# Patient Record
Sex: Male | Born: 1960 | Race: Black or African American | Hispanic: No | Marital: Single | Smoking: Former smoker
Health system: Southern US, Community
[De-identification: ages and names within clinical notes are randomized; demographics above are authoritative.]

## PROBLEM LIST (undated history)

## (undated) DIAGNOSIS — G4700 Insomnia, unspecified: Secondary | ICD-10-CM

## (undated) DIAGNOSIS — E785 Hyperlipidemia, unspecified: Secondary | ICD-10-CM

## (undated) DIAGNOSIS — I10 Essential (primary) hypertension: Secondary | ICD-10-CM

## (undated) DIAGNOSIS — U071 COVID-19: Secondary | ICD-10-CM

## (undated) DIAGNOSIS — M1990 Unspecified osteoarthritis, unspecified site: Secondary | ICD-10-CM

## (undated) DIAGNOSIS — M109 Gout, unspecified: Secondary | ICD-10-CM

## (undated) DIAGNOSIS — E119 Type 2 diabetes mellitus without complications: Secondary | ICD-10-CM

## (undated) DIAGNOSIS — K219 Gastro-esophageal reflux disease without esophagitis: Secondary | ICD-10-CM

## (undated) HISTORY — DX: Unspecified osteoarthritis, unspecified site: M19.90

## (undated) HISTORY — DX: Hyperlipidemia, unspecified: E78.5

## (undated) HISTORY — PX: LEG SURGERY: SHX1003

## (undated) HISTORY — DX: Insomnia, unspecified: G47.00

## (undated) HISTORY — PX: JOINT REPLACEMENT: SHX530

## (undated) NOTE — *Deleted (*Deleted)
POST OPERATIVE OFFICE NOTE    CC:  F/u for surgery  HPI:  This is a 42 y.o. male who is s/p left iliofemoral embolectomy on 01/19/2020 by Dr. Darrick Penna.    He initially presented with 36-48 hrs of coolness and pain in the left BKA stump.  He had his amputation in 2019 by Dr. Lajoyce Corners and had chronic pain since then but worse recently.  Pt uses prosthetic for transfers.   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   He was felt that his acute ischemia in the left BKA was most likely secondary to hypercoagulable state from covid.  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 Myra Gianotti.  His wound cultures showed GPC with Serratia marcescens and Enterococcus faecalis and he was started on Keflex & then switched to oral amoxicillin and Bactrim DS.   He was discharged from the hospital on 10/31 & at that time, his AKA was healing nicely.  He had some slight separation in the left groin that would need meticulous wound care.  He does not have any prosthetic material in the left groin as he had an arteriotomy with primary closure.  He had been started on Eliquis.  He was discharged to SNF.  He was seen back on 02/05/2020 and his left groin wound was not healing and he was scheduled for groin debridement with vac placement on 02/09/2020.  His abx were extended.  Pt was seen back in the office by Dr. Darrick Penna and at that time, his left AKA was healing but still have some edema but no drainage or erythema.  The left groin had healthy appearing granulation tissue and he was scheduled for a 2 week follow up in 2 weeks and to continue vac therapy.  Plan for staple removal at that time.    Pt returns today for follow up.  ***  No Known Allergies  Current Outpatient Medications  Medication Sig Dispense Refill  . acetaminophen (TYLENOL) 325 MG tablet Take 2 tablets (650 mg total) by mouth every 6 (six) hours. 240 tablet 0  . amitriptyline  (ELAVIL) 25 MG tablet Take 25 mg by mouth at bedtime.    Marland Kitchen amLODipine (NORVASC) 10 MG tablet Take 1 tablet (10 mg total) by mouth daily. 30 tablet 0  . apixaban (ELIQUIS) 5 MG TABS tablet Take 1 tablet (5 mg total) by mouth 2 (two) times daily. 60 tablet 0  . aspirin EC 81 MG EC tablet Take 1 tablet (81 mg total) by mouth daily at 6 (six) AM. Swallow whole. 30 tablet 0  . Cholecalciferol (VITAMIN D) 125 MCG (5000 UT) CAPS TAKE (1) CAPSULE BY MOUTH ONCE DAILY. (Patient taking differently: Take 5,000 Units by mouth daily. ) 30 capsule 2  . glipiZIDE (GLUCOTROL XL) 10 MG 24 hr tablet Take 1 tablet (10 mg total) by mouth daily. 90 tablet 1  . glucose blood (ACCU-CHEK AVIVA PLUS) test strip CHECK BLOOD SUGAR 4 TIMES DAILY. 150 strip 2  . insulin aspart (NOVOLOG FLEXPEN) 100 UNIT/ML FlexPen Inject 24-30 Units into the skin 3 (three) times daily with meals. 30 mL 2  . insulin glargine (LANTUS) 100 UNIT/ML injection Inject 0.5 mLs (50 Units total) into the skin 2 (two) times daily. 30 mL 0  . Insulin Pen Needle (ULTICARE MINI PEN NEEDLES) 31G X 6 MM MISC USE AS DIRECTED 100 each 2  . metFORMIN (GLUCOPHAGE-XR) 500 MG 24 hr  tablet TAKE 1 TABLET BY MOUTH TWICE DAILY AFTER A MEAL (Patient taking differently: Take 500 mg by mouth 2 (two) times daily. After meals) 60 tablet 11  . metoprolol succinate (TOPROL-XL) 25 MG 24 hr tablet Take 1 tablet (25 mg total) by mouth daily. 30 tablet 0  . oxyCODONE-acetaminophen (PERCOCET/ROXICET) 5-325 MG tablet Take 1-2 tablets by mouth every 4 (four) hours as needed for moderate pain. 12 tablet 0  . pantoprazole (PROTONIX) 40 MG tablet Take 1 tablet (40 mg total) by mouth daily. 30 tablet 0  . polyethylene glycol (MIRALAX / GLYCOLAX) packet Take 17 g by mouth daily. (Patient taking differently: Take 17 g by mouth daily as needed for moderate constipation. ) 30 each 0  . rosuvastatin (CRESTOR) 10 MG tablet Take 2 tablets (20 mg total) by mouth daily. 90 tablet 3   No current  facility-administered medications for this visit.     ROS:  See HPI  Physical Exam:  ***  Incision:  *** Extremities:  *** Neuro: *** Abdomen:  ***  Assessment/Plan:  This is a 51 y.o. male who is s/p: eft iliofemoral embolectomy on 01/19/2020 by Dr. Darrick Penna, conversion of left BKA to AKA on 01/29/2020 by Dr. Myra Gianotti and left excisional groin debridement on 02/09/2020 by Dr. Darrick Penna presents today for wound check and staple removal.  -***  Doreatha Massed, [facility] [phone]  Clinic MD:  Darrick Penna

---

## 2005-06-19 ENCOUNTER — Emergency Department: Payer: Self-pay | Admitting: Emergency Medicine

## 2008-10-13 ENCOUNTER — Emergency Department: Payer: Self-pay | Admitting: Emergency Medicine

## 2008-10-14 ENCOUNTER — Emergency Department: Payer: Self-pay | Admitting: Unknown Physician Specialty

## 2008-12-01 ENCOUNTER — Emergency Department: Payer: Self-pay | Admitting: Emergency Medicine

## 2009-10-18 ENCOUNTER — Emergency Department: Payer: Self-pay | Admitting: Emergency Medicine

## 2010-04-25 ENCOUNTER — Emergency Department: Payer: Self-pay | Admitting: Emergency Medicine

## 2010-04-30 ENCOUNTER — Inpatient Hospital Stay: Payer: Self-pay | Admitting: Orthopedic Surgery

## 2010-05-25 ENCOUNTER — Inpatient Hospital Stay: Payer: Self-pay | Admitting: Internal Medicine

## 2010-06-10 ENCOUNTER — Ambulatory Visit: Payer: Self-pay | Admitting: Family Medicine

## 2010-07-03 ENCOUNTER — Ambulatory Visit: Payer: Self-pay | Admitting: Family Medicine

## 2010-12-23 ENCOUNTER — Inpatient Hospital Stay: Payer: Self-pay | Admitting: Internal Medicine

## 2011-08-05 ENCOUNTER — Emergency Department: Payer: Self-pay | Admitting: Unknown Physician Specialty

## 2011-08-05 LAB — BASIC METABOLIC PANEL
Anion Gap: 6 — ABNORMAL LOW (ref 7–16)
BUN: 5 mg/dL — ABNORMAL LOW (ref 7–18)
Chloride: 103 mmol/L (ref 98–107)
Co2: 27 mmol/L (ref 21–32)
Creatinine: 0.7 mg/dL (ref 0.60–1.30)
EGFR (Non-African Amer.): >60
Glucose: 327 mg/dL — ABNORMAL HIGH (ref 65–99)
Potassium: 3.7 mmol/L (ref 3.5–5.1)
Sodium: 136 mmol/L (ref 136–145)

## 2011-08-05 LAB — CBC WITH DIFFERENTIAL/PLATELET
Basophil #: 0 10*3/uL (ref 0.0–0.1)
Basophil %: 0.5 %
Eosinophil %: 0.4 %
HGB: 10.5 g/dL — ABNORMAL LOW (ref 13.0–18.0)
Lymphocyte #: 2.1 10*3/uL (ref 1.0–3.6)
Lymphocyte %: 29 %
MCHC: 33.1 g/dL (ref 32.0–36.0)
MCV: 84 fL (ref 80–100)
Neutrophil %: 57.7 %
Platelet: 283 10*3/uL (ref 150–440)
RBC: 3.76 10*6/uL — ABNORMAL LOW (ref 4.40–5.90)
RDW: 16.1 % — ABNORMAL HIGH (ref 11.5–14.5)

## 2011-08-05 LAB — URIC ACID: Uric Acid: 3.1 mg/dL — ABNORMAL LOW (ref 3.5–7.2)

## 2012-07-11 ENCOUNTER — Emergency Department: Payer: Self-pay | Admitting: Emergency Medicine

## 2012-07-11 LAB — COMPREHENSIVE METABOLIC PANEL
Albumin: 3.3 g/dL — ABNORMAL LOW (ref 3.4–5.0)
Alkaline Phosphatase: 136 U/L (ref 50–136)
Anion Gap: 11 (ref 7–16)
BUN: 9 mg/dL (ref 7–18)
Calcium, Total: 9 mg/dL (ref 8.5–10.1)
Co2: 24 mmol/L (ref 21–32)
Creatinine: 0.51 mg/dL — ABNORMAL LOW (ref 0.60–1.30)
EGFR (African American): >60
Glucose: 480 mg/dL — ABNORMAL HIGH (ref 65–99)
Osmolality: 275 (ref 275–301)
SGOT(AST): 26 U/L (ref 15–37)
SGPT (ALT): 30 U/L (ref 12–78)
Sodium: 127 mmol/L — ABNORMAL LOW (ref 136–145)
Total Protein: 8.2 g/dL (ref 6.4–8.2)

## 2012-07-11 LAB — CBC
HGB: 11.1 g/dL — ABNORMAL LOW (ref 13.0–18.0)
MCH: 30.7 pg (ref 26.0–34.0)
MCHC: 35.2 g/dL (ref 32.0–36.0)
Platelet: 301 10*3/uL (ref 150–440)
RBC: 3.62 10*6/uL — ABNORMAL LOW (ref 4.40–5.90)
RDW: 14.1 % (ref 11.5–14.5)

## 2012-08-22 ENCOUNTER — Emergency Department: Payer: Self-pay | Admitting: Emergency Medicine

## 2012-08-22 LAB — DRUG SCREEN, URINE
Amphetamines, Ur Screen: NEGATIVE (ref ?–1000)
Cannabinoid 50 Ng, Ur ~~LOC~~: NEGATIVE (ref ?–50)
Cocaine Metabolite,Ur ~~LOC~~: NEGATIVE (ref ?–300)
MDMA (Ecstasy)Ur Screen: NEGATIVE (ref ?–500)
Methadone, Ur Screen: NEGATIVE (ref ?–300)
Opiate, Ur Screen: NEGATIVE (ref ?–300)
Phencyclidine (PCP) Ur S: NEGATIVE (ref ?–25)

## 2012-08-22 LAB — COMPREHENSIVE METABOLIC PANEL
Albumin: 3.9 g/dL (ref 3.4–5.0)
Alkaline Phosphatase: 99 U/L (ref 50–136)
BUN: 6 mg/dL — ABNORMAL LOW (ref 7–18)
Calcium, Total: 9.1 mg/dL (ref 8.5–10.1)
Chloride: 101 mmol/L (ref 98–107)
Co2: 23 mmol/L (ref 21–32)
Creatinine: 0.61 mg/dL (ref 0.60–1.30)
EGFR (African American): >60
EGFR (Non-African Amer.): >60
Glucose: 379 mg/dL — ABNORMAL HIGH (ref 65–99)
SGPT (ALT): 39 U/L (ref 12–78)
Sodium: 134 mmol/L — ABNORMAL LOW (ref 136–145)
Total Protein: 8.4 g/dL — ABNORMAL HIGH (ref 6.4–8.2)

## 2012-08-22 LAB — CBC
HCT: 34 % — ABNORMAL LOW (ref 40.0–52.0)
HGB: 11.5 g/dL — ABNORMAL LOW (ref 13.0–18.0)
MCH: 30.3 pg (ref 26.0–34.0)
RBC: 3.79 10*6/uL — ABNORMAL LOW (ref 4.40–5.90)
RDW: 16.7 % — ABNORMAL HIGH (ref 11.5–14.5)
WBC: 8.5 10*3/uL (ref 3.8–10.6)

## 2012-08-22 LAB — URINALYSIS, COMPLETE
Glucose,UR: >=500 mg/dL (ref 0–75)
Ketone: NEGATIVE
Leukocyte Esterase: NEGATIVE
Ph: 6 (ref 4.5–8.0)
Protein: NEGATIVE
RBC,UR: 1 /HPF (ref 0–5)
Specific Gravity: 1.003 (ref 1.003–1.030)

## 2012-08-22 LAB — ETHANOL
Ethanol %: 0.298 % — ABNORMAL HIGH (ref 0.000–0.080)
Ethanol: 298 mg/dL

## 2015-01-21 ENCOUNTER — Encounter: Payer: Self-pay | Admitting: Emergency Medicine

## 2015-01-21 ENCOUNTER — Emergency Department
Admission: EM | Admit: 2015-01-21 | Discharge: 2015-01-21 | Disposition: A | Discharge status: Home / Self Care | Payer: Medicaid Other | Attending: Emergency Medicine | Admitting: Emergency Medicine

## 2015-01-21 ENCOUNTER — Emergency Department: Payer: Medicaid Other

## 2015-01-21 DIAGNOSIS — I10 Essential (primary) hypertension: Secondary | ICD-10-CM | POA: Insufficient documentation

## 2015-01-21 DIAGNOSIS — G8929 Other chronic pain: Secondary | ICD-10-CM

## 2015-01-21 DIAGNOSIS — G8911 Acute pain due to trauma: Secondary | ICD-10-CM | POA: Insufficient documentation

## 2015-01-21 DIAGNOSIS — Z794 Long term (current) use of insulin: Secondary | ICD-10-CM | POA: Insufficient documentation

## 2015-01-21 DIAGNOSIS — Z79899 Other long term (current) drug therapy: Secondary | ICD-10-CM | POA: Insufficient documentation

## 2015-01-21 DIAGNOSIS — M19172 Post-traumatic osteoarthritis, left ankle and foot: Secondary | ICD-10-CM | POA: Insufficient documentation

## 2015-01-21 DIAGNOSIS — M25572 Pain in left ankle and joints of left foot: Secondary | ICD-10-CM

## 2015-01-21 DIAGNOSIS — Z720 Tobacco use: Secondary | ICD-10-CM | POA: Insufficient documentation

## 2015-01-21 DIAGNOSIS — E119 Type 2 diabetes mellitus without complications: Secondary | ICD-10-CM | POA: Insufficient documentation

## 2015-01-21 HISTORY — DX: Essential (primary) hypertension: I10

## 2015-01-21 HISTORY — DX: Type 2 diabetes mellitus without complications: E11.9

## 2015-01-21 MED ORDER — NAPROXEN 500 MG PO TBEC: 500.0000 mg | DELAYED_RELEASE_TABLET | Freq: Two times a day (BID) | ORAL | Status: DC

## 2015-01-21 MED ORDER — HYDROCODONE-ACETAMINOPHEN 5-325 MG PO TABS
1.0000 | ORAL_TABLET | Freq: Four times a day (QID) | ORAL | Status: DC | PRN
Start: 2015-01-21 — End: 2016-04-04

## 2015-01-21 MED ORDER — HYDROCODONE-ACETAMINOPHEN 5-325 MG PO TABS
1.0000 | ORAL_TABLET | Freq: Once | ORAL | Status: AC
  Administered 2015-01-21: 1 via ORAL
  Filled 2015-01-21: qty 1

## 2015-01-21 NOTE — ED Notes (Signed)
Pt states he has had previous injury to left ankle and is complaining of continued pain and swelling to ankle.

## 2015-01-21 NOTE — ED Provider Notes (Signed)
[facility] Emergency Department Provider Note ____________________________________________  Time seen: 1225  I have reviewed the triage vital signs and the nursing notes.  HISTORY  Chief Complaint  Ankle Pain  HPI [name] is a 54 y.o. male reports to the ED for evaluation and management of his left ankle pain that he does described as chronic and ongoing. He gives a history of about 10 years of left ankle pain, that has been intermittently managed by his primary provider. He also gives a remote history of fractures to his ankle one in 2000, and the other in 2010. He is unaware of any hardware or screws in the ankle joint. He notes that he has limited range of motion to the ankle at baseline, but over the last few months has noted increased stiffness to the ankle. He does also admit that his primary care provider, has provided him with pain medicines but he reports poor pain relief with the medications. He denies any recent injury, trauma, or swelling to the joint.He rates his pain at a 10/10 in triage.  Past Medical History  Diagnosis Date  . Diabetes mellitus without complication ([place])   . Hypertension     There are no active problems to display for this patient.   Past Surgical History  Procedure Laterality Date  . Joint replacement      Current Outpatient Rx  Name  Route  Sig  Dispense  Refill  . amLODipine (NORVASC) 10 MG tablet   Oral   Take 10 mg by mouth daily.         . furosemide (LASIX) 40 MG tablet   Oral   Take 40 mg by mouth daily.         . insulin glargine (LANTUS) 100 UNIT/ML injection   Subcutaneous   Inject 55 Units into the skin daily.         Marland Kitchen lisinopril (PRINIVIL,ZESTRIL) 40 MG tablet   Oral   Take 40 mg by mouth daily.         . metFORMIN (GLUCOPHAGE) 500 MG tablet   Oral   Take 1,000 mg by mouth daily with breakfast.         . metoprolol succinate (TOPROL-XL) 25 MG 24 hr tablet   Oral   Take 25  mg by mouth daily.         Marland Kitchen HYDROcodone-acetaminophen (NORCO) 5-325 MG tablet   Oral   Take 1 tablet by mouth every 6 (six) hours as needed for moderate pain.   15 tablet   0   . naproxen (EC NAPROSYN) 500 MG EC tablet   Oral   Take 1 tablet (500 mg total) by mouth 2 (two) times daily with a meal.   30 tablet   0     Allergies Review of patient's allergies indicates no known allergies.  History reviewed. No pertinent family history.  Social History Social History  Substance Use Topics  . Smoking status: Current Every Day Smoker -- 0.50 packs/day    Types: Cigarettes  . Smokeless tobacco: None  . Alcohol Use: 2.4 oz/week    4 Cans of beer per week    Review of Systems  Constitutional: Negative for fever. Eyes: Negative for visual changes. ENT: Negative for sore throat. Cardiovascular: Negative for chest pain. Respiratory: Negative for shortness of breath. Gastrointestinal: Negative for abdominal pain, vomiting and diarrhea. Genitourinary: Negative for dysuria. Musculoskeletal: Negative for back pain. Left ankle pain as above. Skin: Negative for rash. Neurological:  Negative for headaches, focal weakness or numbness. ____________________________________________  PHYSICAL EXAM:  VITAL SIGNS: ED Triage Vitals  Enc Vitals Group     BP 01/21/15 1133 122/70 mmHg     Pulse Rate 01/21/15 1133 66     Resp 01/21/15 1133 16     Temp 01/21/15 1133 98.5 F (36.9 C)     Temp Source 01/21/15 1133 Oral     SpO2 01/21/15 1133 98 %     Weight 01/21/15 1133 226 lb (102.513 kg)     Height 01/21/15 1133 5\' 5"  (1.651 m)     Head Cir --      Peak Flow --      Pain Score 01/21/15 1134 10     Pain Loc --      Pain Edu? --      Excl. in [place]? --     Constitutional: Alert and oriented. Well appearing and in no distress. Head: Normocephalic and atraumatic.      Eyes: Conjunctivae are normal. PERRL. Normal extraocular movements      Ears: Canals clear. TMs intact  bilaterally.   Nose: No congestion/rhinorrhea.   Mouth/Throat: Mucous membranes are moist.   Neck: Supple. No thyromegaly. Hematological/Lymphatic/Immunological: No cervical lymphadenopathy. Cardiovascular: Normal rate, regular rhythm. Normal distal pulses. Respiratory: Normal respiratory effort. No wheezes/rales/rhonchi. Gastrointestinal: Soft and nontender. No distention. Musculoskeletal: Left foot with chronic degenerative changes noted. Limited ankle ROM.   Nontender with normal range of motion in all extremities.  Neurologic:  Normal gait without ataxia. Normal speech and language. No gross focal neurologic deficits are appreciated. Skin:  Skin is warm, dry and intact. No rash noted. Psychiatric: Mood and affect are normal. Patient exhibits appropriate insight and judgment. ___________________________________________   RADIOLOGY Left Ankle  IMPRESSION: There is old fracture deformity of distal tibia and fibula. Degenerative changes tibiotalar joint and fibulotalar joint. Mild dorsal spurring tarsal region. Plantar spur of calcaneus. No acute fracture or subluxation. Postsurgical changes distal tibia.  I, Scarlettrose Costilow, Dannielle Karvonen, personally viewed and evaluated these images (plain radiographs) as part of my medical decision making.  ____________________________________________  PROCEDURES  Norco 5-325 mg PO ____________________________________________  INITIAL IMPRESSION / ASSESSMENT AND PLAN / ED COURSE  Patient with chronic left ankle pain with underlying degenerative joint changes. No acute radiographic evidence of injury. Patient will be discharged with a prescription for Naprosyn as well as Vicodin to dose as needed for pain. He is encouraged to follow-up with his primary care provider for ongoing pain management of the ankle. A copy of the radiology report is provided to the patient at his request. ____________________________________________  FINAL CLINICAL  IMPRESSION(S) / ED DIAGNOSES  Final diagnoses:  Chronic ankle pain, left  Post-traumatic osteoarthritis of left ankle      Melvenia Needles, PA-C 01/21/15 1518  Carrie Mew, MD 01/21/15 1539

## 2015-01-21 NOTE — Discharge Instructions (Signed)
Chronic Pain  Chronic pain can be defined as pain that is off and on and lasts for 3-6 months or longer. Many things cause chronic pain, which can make it difficult to make a diagnosis. There are many treatment options available for chronic pain. However, finding a treatment that works well for you may require trying various approaches until the right one is found. Many people benefit from a combination of two or more types of treatment to control their pain.  SYMPTOMS   Chronic pain can occur anywhere in the body and can range from mild to very severe. Some types of chronic pain include:  · Headache.  · Low back pain.  · Cancer pain.  · Arthritis pain.  · Neurogenic pain. This is pain resulting from damage to nerves.   People with chronic pain may also have other symptoms such as:  · Depression.  · Anger.  · Insomnia.  · Anxiety.  DIAGNOSIS   Your health care provider will help diagnose your condition over time. In many cases, the initial focus will be on excluding possible conditions that could be causing the pain. Depending on your symptoms, your health care provider may order tests to diagnose your condition. Some of these tests may include:   · Blood tests.    · CT scan.    · MRI.    · X-rays.    · Ultrasounds.    · Nerve conduction studies.    You may need to see a specialist.   TREATMENT   Finding treatment that works well may take time. You may be referred to a pain specialist. He or she may prescribe medicine or therapies, such as:   · Mindful meditation or yoga.  · Shots (injections) of numbing or pain-relieving medicines into the spine or area of pain.  · Local electrical stimulation.  · Acupuncture.    · Massage therapy.    · Aroma, color, light, or sound therapy.    · Biofeedback.    · Working with a physical therapist to keep from getting stiff.    · Regular, gentle exercise.    · Cognitive or behavioral therapy.    · Group support.    Sometimes, surgery may be recommended.   HOME CARE INSTRUCTIONS    · Take all medicines as directed by your health care provider.    · Lessen stress in your life by relaxing and doing things such as listening to calming music.    · Exercise or be active as directed by your health care provider.    · Eat a healthy diet and include things such as vegetables, fruits, fish, and lean meats in your diet.    · Keep all follow-up appointments with your health care provider.    · Attend a support group with others suffering from chronic pain.  SEEK MEDICAL CARE IF:   · Your pain gets worse.    · You develop a new pain that was not there before.    · You cannot tolerate medicines given to you by your health care provider.    · You have new symptoms since your last visit with your health care provider.    SEEK IMMEDIATE MEDICAL CARE IF:   · You feel weak.    · You have decreased sensation or numbness.    · You lose control of bowel or bladder function.    · Your pain suddenly gets much worse.    · You develop shaking.  · You develop chills.  · You develop confusion.  · You develop chest pain.  · You develop shortness of breath.    MAKE SURE YOU:  ·   Document Revised: 11/20/2012 Document Reviewed: 09/13/2012 Elsevier Interactive Patient Education 2016 Elsevier Inc.  Ankle Pain Ankle pain is a common symptom. The bones, cartilage, tendons, and muscles of the ankle joint perform a lot of work each day. The ankle joint holds your body weight and allows you to move around. Ankle pain can occur on either side or back of 1 or both ankles. Ankle pain may be sharp and burning or dull and aching. There may be tenderness, stiffness, redness, or warmth around the  ankle. The pain occurs more often when a person walks or puts pressure on the ankle. CAUSES  There are many reasons ankle pain can develop. It is important to work with your caregiver to identify the cause since many conditions can impact the bones, cartilage, muscles, and tendons. Causes for ankle pain include:  Injury, including a break (fracture), sprain, or strain often due to a fall, sports, or a high-impact activity.  Swelling (inflammation) of a tendon (tendonitis).  Achilles tendon rupture.  Ankle instability after repeated sprains and strains.  Poor foot alignment.  Pressure on a nerve (tarsal tunnel syndrome).  Arthritis in the ankle or the lining of the ankle.  Crystal formation in the ankle (gout or pseudogout). DIAGNOSIS  A diagnosis is based on your medical history, your symptoms, results of your physical exam, and results of diagnostic tests. Diagnostic tests may include X-ray exams or a computerized magnetic scan (magnetic resonance imaging, MRI). TREATMENT  Treatment will depend on the cause of your ankle pain and may include:  Keeping pressure off the ankle and limiting activities.  Using crutches or other walking support (a cane or brace).  Using rest, ice, compression, and elevation.  Participating in physical therapy or home exercises.  Wearing shoe inserts or special shoes.  Losing weight.  Taking medications to reduce pain or swelling or receiving an injection.  Undergoing surgery. HOME CARE INSTRUCTIONS   Only take over-the-counter or prescription medicines for pain, discomfort, or fever as directed by your caregiver.  Put ice on the injured area.  Put ice in a plastic bag.  Place a towel between your skin and the bag.  Leave the ice on for 15-20 minutes at a time, 03-04 times a day.  Keep your leg raised (elevated) when possible to lessen swelling.  Avoid activities that cause ankle pain.  Follow specific exercises as directed by your  caregiver.  Record how often you have ankle pain, the location of the pain, and what it feels like. This information may be helpful to you and your caregiver.  Ask your caregiver about returning to work or sports and whether you should drive.  Follow up with your caregiver for further examination, therapy, or testing as directed. SEEK MEDICAL CARE IF:   Pain or swelling continues or worsens beyond 1 week.  You have an oral temperature above 102 F (38.9 C).  You are feeling unwell or have chills.  You are having an increasingly difficult time with walking.  You have loss of sensation or other new symptoms.  You have questions or concerns. MAKE SURE YOU:   Understand these instructions.  Will watch your condition.  Will get help right away if you are not doing well or get worse.   This information is not intended to replace advice given to you by your health care provider. Make sure you discuss any questions you have with your health care provider.   Document Released: 09/07/2009 Document Revised: 06/12/2011 Document Reviewed: 10/20/2014 Elsevier  Interactive Patient Education 2016 Elsevier Inc.  Osteoarthritis Osteoarthritis is a disease that causes soreness and inflammation of a joint. It occurs when the cartilage at the affected joint wears down. Cartilage acts as a cushion, covering the ends of bones where they meet to form a joint. Osteoarthritis is the most common form of arthritis. It often occurs in older people. The joints affected most often by this condition include those in the:  Ends of the fingers.  Thumbs.  Neck.  Lower back.  Knees.  Hips. CAUSES  Over time, the cartilage that covers the ends of bones begins to wear away. This causes bone to rub on bone, producing pain and stiffness in the affected joints.  RISK FACTORS Certain factors can increase your chances of having osteoarthritis, including:  Older age.  Excessive body weight.  Overuse of  joints.  Previous joint injury. SIGNS AND SYMPTOMS   Pain, swelling, and stiffness in the joint.  Over time, the joint may lose its normal shape.  Small deposits of bone (osteophytes) may grow on the edges of the joint.  Bits of bone or cartilage can break off and float inside the joint space. This may cause more pain and damage. DIAGNOSIS  Your health care provider will do a physical exam and ask about your symptoms. Various tests may be ordered, such as:  X-rays of the affected joint.  Blood tests to rule out other types of arthritis. Additional tests may be used to diagnose your condition. TREATMENT  Goals of treatment are to control pain and improve joint function. Treatment plans may include:  A prescribed exercise program that allows for rest and joint relief.  A weight control plan.  Pain relief techniques, such as:  Properly applied heat and cold.  Electric pulses delivered to nerve endings under the skin (transcutaneous electrical nerve stimulation [TENS]).  Massage.  Certain nutritional supplements.  Medicines to control pain, such as:  Acetaminophen.  Nonsteroidal anti-inflammatory drugs (NSAIDs), such as naproxen.  Narcotic or central-acting agents, such as tramadol.  Corticosteroids. These can be given orally or as an injection.  Surgery to reposition the bones and relieve pain (osteotomy) or to remove loose pieces of bone and cartilage. Joint replacement may be needed in advanced states of osteoarthritis. HOME CARE INSTRUCTIONS   Take medicines only as directed by your health care provider.  Maintain a healthy weight. Follow your health care provider's instructions for weight control. This may include dietary instructions.  Exercise as directed. Your health care provider can recommend specific types of exercise. These may include:  Strengthening exercises. These are done to strengthen the muscles that support joints affected by arthritis. They can  be performed with weights or with exercise bands to add resistance.  Aerobic activities. These are exercises, such as brisk walking or low-impact aerobics, that get your heart pumping.  Range-of-motion activities. These keep your joints limber.  Balance and agility exercises. These help you maintain daily living skills.  Rest your affected joints as directed by your health care provider.  Keep all follow-up visits as directed by your health care provider. SEEK MEDICAL CARE IF:   Your skin turns red.  You develop a rash in addition to your joint pain.  You have worsening joint pain.  You have a fever along with joint or muscle aches. SEEK IMMEDIATE MEDICAL CARE IF:  You have a significant loss of weight or appetite.  You have night sweats. [place] of Arthritis and Musculoskeletal  and Skin Diseases: www.niams.SouthExposed.es  [employer] on Aging: http://kim-miller.com/  American College of Rheumatology: www.rheumatology.org   This information is not intended to replace advice given to you by your health care provider. Make sure you discuss any questions you have with your health care provider.   Document Released: 03/20/2005 Document Revised: 04/10/2014 Document Reviewed: 11/25/2012 Elsevier Interactive Patient Education [employer].   Your exam and x-ray shows chronic arthritic changes to your ankle joint. You should take the prescription meds as directed.  Follow-up with Dr. Clide Deutscher for further care.

## 2015-05-10 ENCOUNTER — Emergency Department: Payer: Medicaid Other

## 2015-05-10 ENCOUNTER — Encounter: Payer: Self-pay | Admitting: *Deleted

## 2015-05-10 DIAGNOSIS — M10062 Idiopathic gout, left knee: Secondary | ICD-10-CM | POA: Diagnosis not present

## 2015-05-10 DIAGNOSIS — M25562 Pain in left knee: Secondary | ICD-10-CM | POA: Diagnosis present

## 2015-05-10 DIAGNOSIS — F17210 Nicotine dependence, cigarettes, uncomplicated: Secondary | ICD-10-CM | POA: Insufficient documentation

## 2015-05-10 DIAGNOSIS — Z791 Long term (current) use of non-steroidal anti-inflammatories (NSAID): Secondary | ICD-10-CM | POA: Insufficient documentation

## 2015-05-10 DIAGNOSIS — Z79899 Other long term (current) drug therapy: Secondary | ICD-10-CM | POA: Insufficient documentation

## 2015-05-10 DIAGNOSIS — I10 Essential (primary) hypertension: Secondary | ICD-10-CM | POA: Diagnosis not present

## 2015-05-10 DIAGNOSIS — Z7984 Long term (current) use of oral hypoglycemic drugs: Secondary | ICD-10-CM | POA: Insufficient documentation

## 2015-05-10 DIAGNOSIS — Z794 Long term (current) use of insulin: Secondary | ICD-10-CM | POA: Diagnosis not present

## 2015-05-10 DIAGNOSIS — E119 Type 2 diabetes mellitus without complications: Secondary | ICD-10-CM | POA: Diagnosis not present

## 2015-05-10 NOTE — ED Notes (Signed)
Pt brought in via ems from home.  Pt has left knee pain   No known injury to knee.  Swelling to left knee

## 2015-05-11 ENCOUNTER — Emergency Department
Admission: EM | Admit: 2015-05-11 | Discharge: 2015-05-11 | Disposition: A | Discharge status: Home / Self Care | Payer: Medicaid Other | Attending: Emergency Medicine | Admitting: Emergency Medicine

## 2015-05-11 DIAGNOSIS — M109 Gout, unspecified: Secondary | ICD-10-CM

## 2015-05-11 MED ORDER — PREDNISONE 20 MG PO TABS
ORAL_TABLET | ORAL | Status: AC
  Administered 2015-05-11: 60 mg via ORAL
  Filled 2015-05-11: qty 3

## 2015-05-11 MED ORDER — COLCHICINE 0.6 MG PO TABS
1.2000 mg | ORAL_TABLET | Freq: Once | ORAL | Status: AC
  Administered 2015-05-11: 1.2 mg via ORAL
  Filled 2015-05-11: qty 2

## 2015-05-11 MED ORDER — INDOMETHACIN 50 MG PO CAPS
50.0000 mg | ORAL_CAPSULE | Freq: Once | ORAL | Status: AC
  Administered 2015-05-11: 50 mg via ORAL
  Filled 2015-05-11: qty 1

## 2015-05-11 MED ORDER — PREDNISONE 20 MG PO TABS: 60.0000 mg | ORAL_TABLET | Freq: Every day | ORAL | Status: DC

## 2015-05-11 MED ORDER — PREDNISONE 20 MG PO TABS
60.0000 mg | ORAL_TABLET | Freq: Once | ORAL | Status: AC
  Administered 2015-05-11: 60 mg via ORAL

## 2015-05-11 NOTE — Discharge Instructions (Signed)

## 2015-05-11 NOTE — ED Provider Notes (Signed)
[facility] Emergency Department Provider Note  ____________________________________________  Time seen: 6:05 AM  I have reviewed the triage vital signs and the nursing notes.   HISTORY  Chief Complaint Knee Pain     HPI [name] is a 55 y.o. male presents with 8 out of 10 left knee pain times one week with an 3 days. Patient denies any history of gout no recent trauma no fever. Pain is aggravated with ambulation.     Past Medical History  Diagnosis Date  . Diabetes mellitus without complication ([place])   . Hypertension     There are no active problems to display for this patient.   Past Surgical History  Procedure Laterality Date  . Joint replacement      Current Outpatient Rx  Name  Route  Sig  Dispense  Refill  . amLODipine (NORVASC) 10 MG tablet   Oral   Take 10 mg by mouth daily.         . furosemide (LASIX) 40 MG tablet   Oral   Take 40 mg by mouth daily.         Marland Kitchen HYDROcodone-acetaminophen (NORCO) 5-325 MG tablet   Oral   Take 1 tablet by mouth every 6 (six) hours as needed for moderate pain.   15 tablet   0   . insulin glargine (LANTUS) 100 UNIT/ML injection   Subcutaneous   Inject 55 Units into the skin daily.         Marland Kitchen lisinopril (PRINIVIL,ZESTRIL) 40 MG tablet   Oral   Take 40 mg by mouth daily.         . metFORMIN (GLUCOPHAGE) 500 MG tablet   Oral   Take 1,000 mg by mouth daily with breakfast.         . metoprolol succinate (TOPROL-XL) 25 MG 24 hr tablet   Oral   Take 25 mg by mouth daily.         . naproxen (EC NAPROSYN) 500 MG EC tablet   Oral   Take 1 tablet (500 mg total) by mouth 2 (two) times daily with a meal.   30 tablet   0     Allergies Review of patient's allergies indicates no known allergies.  No family history on file.  Social History Social History  Substance Use Topics  . Smoking status: Current Every Day Smoker -- 0.50 packs/day    Types: Cigarettes  .  Smokeless tobacco: None  . Alcohol Use: 2.4 oz/week    4 Cans of beer per week    Review of Systems  Constitutional: Negative for fever. Eyes: Negative for visual changes. ENT: Negative for sore throat. Cardiovascular: Negative for chest pain. Respiratory: Negative for shortness of breath. Gastrointestinal: Negative for abdominal pain, vomiting and diarrhea. Genitourinary: Negative for dysuria. Musculoskeletal: Negative for back pain. Positive for left knee pain Skin: Negative for rash. Neurological: Negative for headaches, focal weakness or numbness.   10-point ROS otherwise negative.  ____________________________________________   PHYSICAL EXAM:  VITAL SIGNS: ED Triage Vitals  Enc Vitals Group     BP 05/10/15 2255 192/88 mmHg     Pulse Rate 05/10/15 2255 89     Resp 05/10/15 2255 18     Temp 05/10/15 2255 97.8 F (36.6 C)     Temp Source 05/10/15 2255 Oral     SpO2 05/10/15 2255 99 %     Weight 05/10/15 2255 220 lb (99.791 kg)     Height 05/10/15 2255 5\' 7"  (  1.702 m)     Head Cir --      Peak Flow --      Pain Score 05/10/15 2300 10     Pain Loc --      Pain Edu? --      Excl. in [place]? --      Constitutional: Alert and oriented. Well appearing and in no distress. Eyes: Conjunctivae are normal. PERRL. Normal extraocular movements. ENT   Head: Normocephalic and atraumatic.   Nose: No congestion/rhinnorhea.   Mouth/Throat: Mucous membranes are moist.   Neck: No stridor. Hematological/Lymphatic/Immunilogical: No cervical lymphadenopathy. Cardiovascular: Normal rate, regular rhythm. Normal and symmetric distal pulses are present in all extremities. No murmurs, rubs, or gallops. Respiratory: Normal respiratory effort without tachypnea nor retractions. Breath sounds are clear and equal bilaterally. No wheezes/rales/rhonchi. Gastrointestinal: Soft and nontender. No distention. There is no CVA tenderness. Genitourinary: deferred Musculoskeletal: Nontender  with normal range of motion in all extremities. No joint effusions.  No lower extremity tenderness nor edema. Neurologic:  Normal speech and language. No gross focal neurologic deficits are appreciated. Speech is normal.  Skin:  Skin is warm, dry and intact. No rash noted. Psychiatric: Mood and affect are normal. Speech and behavior are normal. Patient exhibits appropriate insight and judgment.     INITIAL IMPRESSION / ASSESSMENT AND PLAN / ED COURSE  Pertinent labs & imaging results that were available during my care of the patient were reviewed by me and considered in my medical decision making (see chart for details).   ____________________________________________   FINAL CLINICAL IMPRESSION(S) / ED DIAGNOSES  Final diagnoses:  Acute gout of left knee, unspecified cause      Gregor Hams, MD 05/11/15 [phone]

## 2016-04-04 ENCOUNTER — Observation Stay
Admission: EM | Admit: 2016-04-04 | Discharge: 2016-04-05 | Disposition: A | Discharge status: Home / Self Care | Payer: Medicaid Other | Attending: Specialist | Admitting: Specialist

## 2016-04-04 ENCOUNTER — Emergency Department: Payer: Medicaid Other

## 2016-04-04 ENCOUNTER — Encounter: Payer: Self-pay | Admitting: Emergency Medicine

## 2016-04-04 DIAGNOSIS — E871 Hypo-osmolality and hyponatremia: Secondary | ICD-10-CM

## 2016-04-04 DIAGNOSIS — M2011 Hallux valgus (acquired), right foot: Secondary | ICD-10-CM | POA: Diagnosis not present

## 2016-04-04 DIAGNOSIS — D649 Anemia, unspecified: Secondary | ICD-10-CM | POA: Diagnosis not present

## 2016-04-04 DIAGNOSIS — Z794 Long term (current) use of insulin: Secondary | ICD-10-CM | POA: Diagnosis not present

## 2016-04-04 DIAGNOSIS — I10 Essential (primary) hypertension: Secondary | ICD-10-CM | POA: Diagnosis not present

## 2016-04-04 DIAGNOSIS — F17210 Nicotine dependence, cigarettes, uncomplicated: Secondary | ICD-10-CM | POA: Insufficient documentation

## 2016-04-04 DIAGNOSIS — M109 Gout, unspecified: Secondary | ICD-10-CM | POA: Diagnosis not present

## 2016-04-04 DIAGNOSIS — M19072 Primary osteoarthritis, left ankle and foot: Secondary | ICD-10-CM | POA: Diagnosis not present

## 2016-04-04 DIAGNOSIS — M19071 Primary osteoarthritis, right ankle and foot: Secondary | ICD-10-CM | POA: Insufficient documentation

## 2016-04-04 DIAGNOSIS — M79672 Pain in left foot: Secondary | ICD-10-CM

## 2016-04-04 DIAGNOSIS — E1140 Type 2 diabetes mellitus with diabetic neuropathy, unspecified: Secondary | ICD-10-CM | POA: Diagnosis not present

## 2016-04-04 DIAGNOSIS — M79671 Pain in right foot: Secondary | ICD-10-CM | POA: Diagnosis present

## 2016-04-04 HISTORY — DX: Gout, unspecified: M10.9

## 2016-04-04 LAB — COMPREHENSIVE METABOLIC PANEL
ALBUMIN: 4 g/dL (ref 3.5–5.0)
ALT: 35 U/L (ref 17–63)
ANION GAP: 8 (ref 5–15)
AST: 30 U/L (ref 15–41)
Alkaline Phosphatase: 59 U/L (ref 38–126)
BUN: 13 mg/dL (ref 6–20)
CHLORIDE: 101 mmol/L (ref 101–111)
CO2: 24 mmol/L (ref 22–32)
Calcium: 9.5 mg/dL (ref 8.9–10.3)
Creatinine, Ser: 0.84 mg/dL (ref 0.61–1.24)
GFR calc non Af Amer: >60 mL/min (ref >60)
Glucose, Bld: 178 mg/dL — ABNORMAL HIGH (ref 65–99)
POTASSIUM: 4 mmol/L (ref 3.5–5.1)
SODIUM: 133 mmol/L — AB (ref 135–145)
Total Bilirubin: 0.6 mg/dL (ref 0.3–1.2)
Total Protein: 8.3 g/dL — ABNORMAL HIGH (ref 6.5–8.1)

## 2016-04-04 LAB — CBC WITH DIFFERENTIAL/PLATELET
Basophils Absolute: 0 10*3/uL (ref 0–0.1)
Basophils Relative: 1 %
EOS ABS: 0.1 10*3/uL (ref 0–0.7)
EOS PCT: 1 %
HCT: 34.2 % — ABNORMAL LOW (ref 40.0–52.0)
Hemoglobin: 11.5 g/dL — ABNORMAL LOW (ref 13.0–18.0)
LYMPHS ABS: 2.2 10*3/uL (ref 1.0–3.6)
Lymphocytes Relative: 27 %
MCH: 30.3 pg (ref 26.0–34.0)
MCHC: 33.7 g/dL (ref 32.0–36.0)
MCV: 89.9 fL (ref 80.0–100.0)
Monocytes Absolute: 1.1 10*3/uL — ABNORMAL HIGH (ref 0.2–1.0)
Monocytes Relative: 14 %
Neutro Abs: 4.6 10*3/uL (ref 1.4–6.5)
Neutrophils Relative %: 57 %
PLATELETS: 341 10*3/uL (ref 150–440)
RBC: 3.81 MIL/uL — AB (ref 4.40–5.90)
RDW: 13.4 % (ref 11.5–14.5)
WBC: 8 10*3/uL (ref 3.8–10.6)

## 2016-04-04 LAB — URINALYSIS, COMPLETE (UACMP) WITH MICROSCOPIC
BILIRUBIN URINE: NEGATIVE
Bacteria, UA: NONE SEEN
Glucose, UA: NEGATIVE mg/dL
HGB URINE DIPSTICK: NEGATIVE
KETONES UR: NEGATIVE mg/dL
LEUKOCYTES UA: NEGATIVE
NITRITE: NEGATIVE
Protein, ur: NEGATIVE mg/dL
RBC / HPF: NONE SEEN RBC/hpf (ref 0–5)
SPECIFIC GRAVITY, URINE: 1.003 — AB (ref 1.005–1.030)
Squamous Epithelial / LPF: NONE SEEN
WBC UA: NONE SEEN WBC/hpf (ref 0–5)
pH: 5 (ref 5.0–8.0)

## 2016-04-04 LAB — URIC ACID: Uric Acid, Serum: 7.7 mg/dL — ABNORMAL HIGH (ref 4.4–7.6)

## 2016-04-04 LAB — GLUCOSE, CAPILLARY: Glucose-Capillary: 332 mg/dL — ABNORMAL HIGH (ref 65–99)

## 2016-04-04 MED ORDER — ONDANSETRON HCL 4 MG/2ML IJ SOLN: 4.0000 mg | Freq: Four times a day (QID) | INTRAMUSCULAR | Status: DC | PRN

## 2016-04-04 MED ORDER — HYDROMORPHONE HCL 1 MG/ML IJ SOLN
0.5000 mg | Freq: Once | INTRAMUSCULAR | Status: AC
  Administered 2016-04-04: 0.5 mg via INTRAVENOUS
  Filled 2016-04-04: qty 1

## 2016-04-04 MED ORDER — ACETAMINOPHEN 325 MG PO TABS: 650.0000 mg | ORAL_TABLET | Freq: Four times a day (QID) | ORAL | Status: DC | PRN

## 2016-04-04 MED ORDER — LISINOPRIL 20 MG PO TABS
40.0000 mg | ORAL_TABLET | Freq: Every day | ORAL | Status: DC
  Administered 2016-04-05: 40 mg via ORAL
  Filled 2016-04-04: qty 2

## 2016-04-04 MED ORDER — HYDROMORPHONE HCL 1 MG/ML IJ SOLN
1.0000 mg | Freq: Once | INTRAMUSCULAR | Status: AC
  Administered 2016-04-04: 1 mg via INTRAVENOUS
  Filled 2016-04-04: qty 1

## 2016-04-04 MED ORDER — FUROSEMIDE 40 MG PO TABS
40.0000 mg | ORAL_TABLET | Freq: Every day | ORAL | Status: DC
  Administered 2016-04-05: 40 mg via ORAL
  Filled 2016-04-04: qty 1

## 2016-04-04 MED ORDER — COLCHICINE 0.6 MG PO TABS
1.2000 mg | ORAL_TABLET | Freq: Once | ORAL | Status: AC
  Administered 2016-04-04: 1.2 mg via ORAL
  Filled 2016-04-04: qty 2

## 2016-04-04 MED ORDER — ONDANSETRON HCL 4 MG PO TABS: 4.0000 mg | ORAL_TABLET | Freq: Four times a day (QID) | ORAL | Status: DC | PRN

## 2016-04-04 MED ORDER — AMLODIPINE BESYLATE 10 MG PO TABS
10.0000 mg | ORAL_TABLET | Freq: Every day | ORAL | Status: DC
  Administered 2016-04-05: 10 mg via ORAL
  Filled 2016-04-04 (×2): qty 1

## 2016-04-04 MED ORDER — ENOXAPARIN SODIUM 40 MG/0.4ML ~~LOC~~ SOLN
40.0000 mg | SUBCUTANEOUS | Status: DC
  Administered 2016-04-04: 40 mg via SUBCUTANEOUS
  Filled 2016-04-04 (×2): qty 0.4

## 2016-04-04 MED ORDER — METHYLPREDNISOLONE SODIUM SUCC 125 MG IJ SOLR
125.0000 mg | Freq: Once | INTRAMUSCULAR | Status: AC
  Administered 2016-04-04: 125 mg via INTRAVENOUS
  Filled 2016-04-04: qty 2

## 2016-04-04 MED ORDER — MELOXICAM 7.5 MG PO TABS
7.5000 mg | ORAL_TABLET | Freq: Every day | ORAL | Status: DC
  Administered 2016-04-05: 7.5 mg via ORAL
  Filled 2016-04-04: qty 1

## 2016-04-04 MED ORDER — GABAPENTIN 100 MG PO CAPS
100.0000 mg | ORAL_CAPSULE | Freq: Three times a day (TID) | ORAL | Status: DC
  Administered 2016-04-04 – 2016-04-05 (×5): 100 mg via ORAL
  Filled 2016-04-04 (×5): qty 1

## 2016-04-04 MED ORDER — INSULIN GLARGINE 100 UNIT/ML ~~LOC~~ SOLN
55.0000 [IU] | Freq: Every day | SUBCUTANEOUS | Status: DC
  Administered 2016-04-05: 55 [IU] via SUBCUTANEOUS
  Filled 2016-04-04 (×2): qty 0.55

## 2016-04-04 MED ORDER — PREDNISONE 20 MG PO TABS
40.0000 mg | ORAL_TABLET | Freq: Every day | ORAL | Status: DC
  Administered 2016-04-05: 40 mg via ORAL
  Filled 2016-04-04: qty 2

## 2016-04-04 MED ORDER — METOPROLOL SUCCINATE ER 25 MG PO TB24
25.0000 mg | ORAL_TABLET | Freq: Every day | ORAL | Status: DC
  Administered 2016-04-05: 25 mg via ORAL
  Filled 2016-04-04: qty 1

## 2016-04-04 MED ORDER — METFORMIN HCL 500 MG PO TABS
1000.0000 mg | ORAL_TABLET | Freq: Every day | ORAL | Status: DC
  Administered 2016-04-05: 1000 mg via ORAL
  Filled 2016-04-04 (×2): qty 2

## 2016-04-04 MED ORDER — ACETAMINOPHEN 650 MG RE SUPP: 650.0000 mg | Freq: Four times a day (QID) | RECTAL | Status: DC | PRN

## 2016-04-04 MED ORDER — HYDROCODONE-ACETAMINOPHEN 5-325 MG PO TABS
1.0000 | ORAL_TABLET | ORAL | Status: DC | PRN
  Administered 2016-04-05: 2 via ORAL
  Filled 2016-04-04: qty 2

## 2016-04-04 NOTE — ED Notes (Signed)
Pt sleeping sitting up on stretcher. Side rails up. Pt television on.

## 2016-04-04 NOTE — ED Notes (Signed)
Collected re-draw of lavender tube and sent to lab.

## 2016-04-04 NOTE — ED Triage Notes (Signed)
Patient comes in via [facility] from home for foot pain bilaterally for a while now. Came in today because it is worse. Per EMS patient was unable to to walk to them he crawled. Pain is worse on bottom and top. Hx of diabetes, gout, htn. Pulses intact bilaterally.

## 2016-04-04 NOTE — ED Notes (Signed)
Attempted to walk patient.  Pt sat on the side of the bed and placed feet on the floor then states he is unable to bear weight on feet due to pain.  Pt laid back in bed.

## 2016-04-04 NOTE — ED Notes (Signed)
Blood pressure right arm 139/87 Blood pressure left arm 127/68  Blood pressure right leg 148/67 Blood pressure left leg 129/56

## 2016-04-04 NOTE — H&P (Signed)
[place] [facility] [place] NAME: [name]    MR#:  [id number]  DATE OF BIRTH:  May 21, 1960  DATE OF ADMISSION:  04/04/2016  PRIMARY CARE PHYSICIAN: Donnie Coffin, MD   REQUESTING/REFERRING PHYSICIAN: Dr. Lenise Arena  CHIEF COMPLAINT:   Chief Complaint  Patient presents with  . Foot Pain    HISTORY OF PRESENT ILLNESS:  [name]  is a 56 y.o. male with a known history of IBD disease, hypertension, gout who presents to the hospital due to bilateral foot pain and difficulty walking. Patient says he's been having worsening pain in his feet now for the past month but today it was much worse and therefore he called EMS and he was brought to the hospital. Patient denies any fevers, chills, trauma to the foot. He was brought to the ER and received multiple doses of IV pain medications along with colchicine given his history of gout but despite that he was unable to ambulate or bear any weight on his foot. His x-rays of his foot showed no evidence of acute pathology. Hospitalist services were contacted further treatment and evaluation.  PAST MEDICAL HISTORY:   Past Medical History:  Diagnosis Date  . Diabetes mellitus without complication ([place])   . Gout   . Hypertension     PAST SURGICAL HISTORY:   Past Surgical History:  Procedure Laterality Date  . JOINT REPLACEMENT      SOCIAL HISTORY:   Social History  Substance Use Topics  . Smoking status: Current Every Day Smoker    Packs/day: 1.00    Years: 50.00    Types: Cigarettes  . Smokeless tobacco: Never Used  . Alcohol use 2.4 oz/week    4 Cans of beer per week     Comment: 4 40 oz beer daily    FAMILY HISTORY:   Family History  Problem Relation Age of Onset  . Diabetes Mother     DRUG ALLERGIES:  No Known Allergies  REVIEW OF SYSTEMS:   Review of Systems  Constitutional: Negative for fever and weight loss.  HENT: Negative for congestion, nosebleeds  and tinnitus.   Eyes: Negative for blurred vision, double vision and redness.  Respiratory: Negative for cough, hemoptysis and shortness of breath.   Cardiovascular: Negative for chest pain, orthopnea, leg swelling and PND.  Gastrointestinal: Negative for abdominal pain, diarrhea, melena, nausea and vomiting.  Genitourinary: Negative for dysuria, hematuria and urgency.  Musculoskeletal: Negative for falls and joint pain.  Neurological: Negative for dizziness, tingling, sensory change, focal weakness, seizures, weakness and headaches.  Endo/Heme/Allergies: Negative for polydipsia. Does not bruise/bleed easily.  Psychiatric/Behavioral: Negative for depression and memory loss. The patient is not nervous/anxious.     MEDICATIONS AT HOME:   Prior to Admission medications   Medication Sig Start Date End Date Taking? Authorizing Provider  amLODipine (NORVASC) 10 MG tablet Take 10 mg by mouth daily.   Yes Historical Provider, MD  furosemide (LASIX) 40 MG tablet Take 40 mg by mouth daily.   Yes Historical Provider, MD  insulin glargine (LANTUS) 100 UNIT/ML injection Inject 55 Units into the skin daily.   Yes Historical Provider, MD  lisinopril (PRINIVIL,ZESTRIL) 40 MG tablet Take 40 mg by mouth daily.   Yes Historical Provider, MD  meloxicam (MOBIC) 7.5 MG tablet Take 7.5 mg by mouth daily.   Yes Historical Provider, MD  metFORMIN (GLUCOPHAGE) 500 MG tablet Take 1,000 mg by mouth daily with breakfast.   Yes Historical Provider,  MD  metoprolol succinate (TOPROL-XL) 25 MG 24 hr tablet Take 25 mg by mouth daily.   Yes Historical Provider, MD      VITAL SIGNS:  Blood pressure 135/79, pulse (!) 57, temperature 97.9 F (36.6 C), temperature source Oral, resp. rate 18, height 5\' 10"  (1.778 m), weight 102.5 kg (226 lb), SpO2 95 %.  PHYSICAL EXAMINATION:  Physical Exam  GENERAL:  56 y.o.-year-old patient lying in the bed in no acute distress.  EYES: Pupils equal, round, reactive to light and  accommodation. No scleral icterus. Extraocular muscles intact.  HEENT: Head atraumatic, normocephalic. Oropharynx and nasopharynx clear. No oropharyngeal erythema, moist oral mucosa  NECK:  Supple, no jugular venous distention. No thyroid enlargement, no tenderness.  LUNGS: Normal breath sounds bilaterally, no wheezing, rales, rhonchi. No use of accessory muscles of respiration.  CARDIOVASCULAR: S1, S2 RRR. No murmurs, rubs, gallops, clicks.  ABDOMEN: Soft, nontender, nondistended. Bowel sounds present. No organomegaly or mass.  EXTREMITIES: No pedal edema, cyanosis, or clubbing. + 2 pedal & radial pulses b/l. No focal deformity, warmth on feet noted.   NEUROLOGIC: Cranial nerves II through XII are intact. No focal Motor or sensory deficits appreciated b/l PSYCHIATRIC: The patient is alert and oriented x 3. Good affect.  SKIN: No obvious rash, lesion, or ulcer.   LABORATORY PANEL:   CBC  Recent Labs Lab 04/04/16 0951  WBC 8.0  HGB 11.5*  HCT 34.2*  PLT 341   ------------------------------------------------------------------------------------------------------------------  Chemistries   Recent Labs Lab 04/04/16 0843  NA 133*  K 4.0  CL 101  CO2 24  GLUCOSE 178*  BUN 13  CREATININE 0.84  CALCIUM 9.5  AST 30  ALT 35  ALKPHOS 59  BILITOT 0.6   ------------------------------------------------------------------------------------------------------------------  Cardiac Enzymes No results for input(s): TROPONINI in the last 168 hours. ------------------------------------------------------------------------------------------------------------------  RADIOLOGY:  Dg Foot Complete Left  Result Date: 04/04/2016 CLINICAL DATA:  Chronic pain EXAM: LEFT FOOT - COMPLETE 3+ VIEW COMPARISON:  Left ankle January 21, 2015 FINDINGS: Frontal, oblique, lateral views were obtained. There is evidence of prior trauma involving the calcaneus with extensive bony remodeling. There is old trauma  to the distal tibia and fibula with screw fixation in the distal tibia. There is advanced arthropathy throughout the hindfoot, particularly involving the subtalar joints which appears grossly stable. There is moderate spurring in the dorsal midfoot which appears stable. There is an old fracture with remodeling involving the fifth proximal phalanx. No acute fracture or dislocation is evident. There is mild generalized soft tissue swelling. There is no erosive change or bony destruction evident. There is mild narrowing of all PIP and DIP joints as well as the first MTP joint. There is a rather prominent inferior calcaneal spur. IMPRESSION: Old hindfoot trauma with advanced arthropathy and remodeling which appears stable. Moderate spurring in the dorsal midfoot is stable. There is a stable inferior calcaneal spur. There is generalized soft tissue swelling. There is narrowing of all PIP and DIP joints as well as the first MTP joint. No erosive change or bony destruction evident. No acute fracture or dislocation. Old trauma with remodeling fifth proximal phalanx. Electronically Signed   By: Lowella Grip III M.D.   On: 04/04/2016 08:49   Dg Foot Complete Right  Result Date: 04/04/2016 CLINICAL DATA:  Bilateral foot pain EXAM: RIGHT FOOT COMPLETE - 3+ VIEW COMPARISON:  08/05/2011 FINDINGS: Three views of the right foot submitted. Mild hallux valgus deformity. No acute fracture or subluxation. Mild degenerative changes first metatarsal phalangeal  joint. Dorsal spurring tarsal region. There is plantar and posterior spurring of calcaneus. IMPRESSION: No acute fracture or subluxation. Mild hallux valgus deformity. Degenerative changes as described above. Plantar and posterior spurring of calcaneus. Electronically Signed   By: Lahoma Crocker M.D.   On: 04/04/2016 08:48     IMPRESSION AND PLAN:   56 year old male with past medical history of diabetes, hypertension, gout who presents to the hospital due to bilateral  foot pain and difficulty walking.   1. Bilateral foot pain-etiology unclear. Patient does have a history of gout but his foot is not warm to touch or red in color. -X-rays of his foot shows no acute abnormality except for some underlying osteoarthritis. Patient's uric acid is mildly elevated. -I suspect this is probably underlying neuropathic pain along with osteoarthritis. I will place him on some gabapentin, oral pain medications, and empiric prednisone to treat underlying possible gout and follow clinically. -We'll get a physical therapy consult to assess his mobility.  2. Diabetes type 2 without complication-continue Lantus, metformin. Continue carb-controlled diet.  3. Essential hypertension-continue Norvasc, lisinopril, Toprol.  4. Osteoarthritis-continue meloxicam.  All the records are reviewed and case discussed with ED provider. Management plans discussed with the patient, family and they are in agreement.  CODE STATUS: Full  TOTAL TIME TAKING CARE OF THIS PATIENT: 45 minutes.    Henreitta Leber M.D on 04/04/2016 at 2:39 PM  Between 7am to 6pm - Pager - [phone]  After 6pm go to [email or web address] - password [email or web address] [facility]  Office  [phone]  CC: Primary care physician; Donnie Coffin, MD

## 2016-04-04 NOTE — ED Notes (Signed)
Pt being transported to rm 135 by this tech.

## 2016-04-04 NOTE — ED Provider Notes (Signed)
[facility] Emergency Department Provider Note        Time seen: ----------------------------------------- 8:23 AM on 04/04/2016 -----------------------------------------    I have reviewed the triage vital signs and the nursing notes.   HISTORY  Chief Complaint Foot Pain    HPI [name] is a 56 y.o. male who presents to the ER being brought by EMS from home for bilateral foot pain. Patient states it has been going on for some time to get worse today. Patient was unable to walk and had to crawl out of his house to EMS. Pain is worse on the bottom and on the top of the foot, left foot seems to worsen the right. He reportedly has history of diabetes and gout with hypertension. He denies fevers or chills.   Past Medical History:  Diagnosis Date  . Diabetes mellitus without complication ([place])   . Hypertension     There are no active problems to display for this patient.   Past Surgical History:  Procedure Laterality Date  . JOINT REPLACEMENT      Allergies Patient has no known allergies.  Social History Social History  Substance Use Topics  . Smoking status: Current Every Day Smoker    Packs/day: 0.50    Types: Cigarettes  . Smokeless tobacco: Not on file  . Alcohol use 2.4 oz/week    4 Cans of beer per week    Review of Systems Constitutional: Negative for fever. Cardiovascular: Negative for chest pain. Respiratory: Negative for shortness of breath. Gastrointestinal: Negative for abdominal pain, vomiting and diarrhea. Genitourinary: Negative for dysuria. Musculoskeletal: Positive for bilateral foot pain Skin: Positive for left foot redness Neurological: Negative for headaches, focal weakness or numbness.  10-point ROS otherwise negative.  ____________________________________________   PHYSICAL EXAM:  VITAL SIGNS: ED Triage Vitals  Enc Vitals Group     BP 04/04/16 0820 139/87     Pulse Rate 04/04/16 0820 66   Resp 04/04/16 0820 18     Temp 04/04/16 0820 97.9 F (36.6 C)     Temp Source 04/04/16 0820 Oral     SpO2 04/04/16 0820 99 %     Weight 04/04/16 0821 226 lb (102.5 kg)     Height 04/04/16 0821 5\' 10"  (1.778 m)     Head Circumference --      Peak Flow --      Pain Score 04/04/16 0822 9     Pain Loc --      Pain Edu? --      Excl. in [place]? --     Constitutional: Alert and oriented. Well appearing and in no distress. Eyes: Conjunctivae are normal. PERRL. Normal extraocular movements. ENT   Head: Normocephalic and atraumatic.   Nose: No congestion/rhinnorhea.   Mouth/Throat: Mucous membranes are moist.   Neck: No stridor. Cardiovascular: Normal rate, regular rhythm. No murmurs, rubs, or gallops. Respiratory: Normal respiratory effort without tachypnea nor retractions. Breath sounds are clear and equal bilaterally. No wheezes/rales/rhonchi. Gastrointestinal: Soft and nontender. Normal bowel sounds Musculoskeletal: Left foot is enlarged compared to the right, diffusely tender. Left foot tenderness seems to be worst around the first MTP joint. There is mild left foot erythema. Both feet are tender to touch. Neurologic:  Normal speech and language. No gross focal neurologic deficits are appreciated.  Skin:  Left foot erythema diffusely Psychiatric: Mood and affect are normal. Speech and behavior are normal.  ____________________________________________  ED COURSE:  Pertinent labs & imaging results that were available during my  care of the patient were reviewed by me and considered in my medical decision making (see chart for details). Clinical Course   Patient presents to the ER in no distress but with bilateral foot pain. This is likely diabetic neuropathy and gout related. We will assess with labs and imaging.  Procedures ____________________________________________   LABS (pertinent positives/negatives)  Labs Reviewed  COMPREHENSIVE METABOLIC PANEL - Abnormal; Notable  for the following:       Result Value   Sodium 133 (*)    Glucose, Bld 178 (*)    Total Protein 8.3 (*)    All other components within normal limits  URIC ACID - Abnormal; Notable for the following:    Uric Acid, Serum 7.7 (*)    All other components within normal limits  URINALYSIS, COMPLETE (UACMP) WITH MICROSCOPIC - Abnormal; Notable for the following:    Color, Urine COLORLESS (*)    APPearance CLEAR (*)    Specific Gravity, Urine 1.003 (*)    All other components within normal limits  CBC WITH DIFFERENTIAL/PLATELET - Abnormal; Notable for the following:    RBC 3.81 (*)    Hemoglobin 11.5 (*)    HCT 34.2 (*)    Monocytes Absolute 1.1 (*)    All other components within normal limits  CBC WITH DIFFERENTIAL/PLATELET    RADIOLOGY Images were viewed by me  Left/Right foot x-ray IMPRESSION: No acute fracture or subluxation. Mild hallux valgus deformity. Degenerative changes as described above. Plantar and posterior spurring of calcaneus. IMPRESSION: Old hindfoot trauma with advanced arthropathy and remodeling which appears stable. Moderate spurring in the dorsal midfoot is stable. There is a stable inferior calcaneal spur.  There is generalized soft tissue swelling.  There is narrowing of all PIP and DIP joints as well as the first MTP joint. No erosive change or bony destruction evident. No acute fracture or dislocation. Old trauma with remodeling fifth proximal phalanx. ____________________________________________  FINAL ASSESSMENT AND PLAN  Foot pain, gout, diabetic neuropathy  Plan: Patient with labs and imaging as dictated above. Patient was severe bilateral foot pain likely secondary to both gout and diabetic neuropathy. He's been started on culture seen, Solu-Medrol and is had multiple doses of Dilaudid. He can still not bear weight on his feet. We will discuss with the hospitalist service for admission for pain control.   Earleen Newport, MD   Note:  This dictation was prepared with Dragon dictation. Any transcriptional errors that result from this process are unintentional    Earleen Newport, MD 04/04/16 1223

## 2016-04-04 NOTE — ED Notes (Signed)
Patient states, "are you sure you are giving me something for pain in my Iv?" Patient reassured that he is getting IV dilaudid for pain. Dr Jimmye Norman notified.

## 2016-04-04 NOTE — ED Notes (Signed)
Admitting at bedside 

## 2016-04-05 DIAGNOSIS — E871 Hypo-osmolality and hyponatremia: Secondary | ICD-10-CM

## 2016-04-05 DIAGNOSIS — M109 Gout, unspecified: Secondary | ICD-10-CM

## 2016-04-05 DIAGNOSIS — D649 Anemia, unspecified: Secondary | ICD-10-CM

## 2016-04-05 LAB — GLUCOSE, CAPILLARY
GLUCOSE-CAPILLARY: 304 mg/dL — AB (ref 65–99)
Glucose-Capillary: 259 mg/dL — ABNORMAL HIGH (ref 65–99)
Glucose-Capillary: 356 mg/dL — ABNORMAL HIGH (ref 65–99)

## 2016-04-05 LAB — URIC ACID: Uric Acid, Serum: 7.5 mg/dL (ref 4.4–7.6)

## 2016-04-05 MED ORDER — GLUCOSE BLOOD VI STRP: ORAL_STRIP | 12 refills | Status: DC

## 2016-04-05 MED ORDER — GABAPENTIN 100 MG PO CAPS: 100.0000 mg | ORAL_CAPSULE | Freq: Three times a day (TID) | ORAL | 5 refills | Status: DC

## 2016-04-05 MED ORDER — INSULIN ASPART 100 UNIT/ML ~~LOC~~ SOLN
5.0000 [IU] | Freq: Three times a day (TID) | SUBCUTANEOUS | Status: DC
  Administered 2016-04-05 (×2): 5 [IU] via SUBCUTANEOUS
  Filled 2016-04-05: qty 5

## 2016-04-05 MED ORDER — INSULIN ASPART 100 UNIT/ML ~~LOC~~ SOLN
0.0000 [IU] | Freq: Three times a day (TID) | SUBCUTANEOUS | Status: DC
  Filled 2016-04-05: qty 15

## 2016-04-05 MED ORDER — INSULIN ASPART 100 UNIT/ML ~~LOC~~ SOLN: 5.0000 [IU] | Freq: Three times a day (TID) | SUBCUTANEOUS | 11 refills | Status: DC

## 2016-04-05 MED ORDER — ACCU-CHEK MULTICLIX LANCETS MISC: 12 refills | Status: DC

## 2016-04-05 MED ORDER — "INSULIN SYRINGE-NEEDLE U-100 25G X 1"" 1 ML MISC": 0 refills | Status: DC

## 2016-04-05 MED ORDER — INSULIN ASPART 100 UNIT/ML ~~LOC~~ SOLN: 0.0000 [IU] | Freq: Every day | SUBCUTANEOUS | Status: DC

## 2016-04-05 MED ORDER — INSULIN ASPART 100 UNIT/ML ~~LOC~~ SOLN
5.0000 [IU] | Freq: Three times a day (TID) | SUBCUTANEOUS | Status: DC
  Filled 2016-04-05: qty 5

## 2016-04-05 MED ORDER — INSULIN ASPART 100 UNIT/ML ~~LOC~~ SOLN
0.0000 [IU] | Freq: Three times a day (TID) | SUBCUTANEOUS | Status: DC
  Administered 2016-04-05: 15 [IU] via SUBCUTANEOUS
  Administered 2016-04-05: 11 [IU] via SUBCUTANEOUS
  Filled 2016-04-05: qty 11

## 2016-04-05 MED ORDER — HYDROCODONE-ACETAMINOPHEN 5-325 MG PO TABS: 1.0000 | ORAL_TABLET | ORAL | 0 refills | Status: DC | PRN

## 2016-04-05 MED ORDER — PREDNISONE 10 MG (21) PO TBPK: 10.0000 mg | ORAL_TABLET | Freq: Every day | ORAL | 0 refills | Status: DC

## 2016-04-05 NOTE — Progress Notes (Signed)
PT Cancellation Note  Patient Details Name: [name] MRN: [id number] DOB: 1961/01/22   Cancelled Treatment:    Reason Eval/Treat Not Completed: Other (comment) (Awaiting pain meds and eating his breakfast). Will check back after meds given.   Ramond Dial 04/05/2016, 10:02 AM   Mee Hives, PT MS Acute Rehab Dept. Number: [place] [facility] [place]

## 2016-04-05 NOTE — Care Management Note (Signed)
Case Management Note  Patient Details  Name: [name] MRN: [id number] Date of Birth: [birth year]  Subjective/Objective:  Rolling walker ordered from [facility]. No other needs identified. Case Closed.                    Action/Plan:   Expected Discharge Date:    04/05/2016              Expected Discharge Plan:  Home/Self Care  In-House Referral:     Discharge planning Services     Post Acute Care Choice:  Durable Medical Equipment Choice offered to:     DME Arranged:  Walker rolling DME Agency:  [place]:    [facility] Agency:     Status of Service:  Completed, signed off  If discussed at [place] of Stay Meetings, dates discussed:    Additional Comments:  Jolly Mango, RN 04/05/2016, 11:05 AM

## 2016-04-05 NOTE — Discharge Planning (Addendum)
Patient IV removed. Patient discharge papers given, explained and educated.  Patient displayed through teach-back, understanding of how to use sliding scale to administer appropriate dosages of insulin x3 meals.  Scripts given for Insulin, glucometer, strips and lancets.  Informed of suggested FU appts - yet unable to make for patient d/t late hour of discharge.  Patient also given scripts for prednisone and pain meds.  RN assessment and VS revealed stability for DC to home.  Patient leaving in no pain.  Taxi voucher given to get patient to mom's house, since patient unable to get home otherwise (per patient).  [facility] [facility] [employer] called and awaiting arrival. Report given to 3rd shift, RN - still awaiting arrival of [employer].

## 2016-04-05 NOTE — Evaluation (Signed)
Physical Therapy Evaluation Patient Details Name: [name] MRN: [id number] DOB: 12/07/1960 Today's Date: 04/05/2016   History of Present Illness  56 yo male with onset of BLE neuropathic foot pain was admitted, has multiple old injuries to feet of B 5th prox phalanx fractures, old L distal tib fracture with screws, DM, gout, HTN, OA.    Clinical Impression  Pt is up to walk with PT and has some limitation mainly related to pain but not affecting strength.  Will follow acutely to increase his distance, endurance and safety awareness.  Otherwise will not need further therapy upon discharge.    Follow Up Recommendations No PT follow up    Equipment Recommendations  Rolling walker with 5" wheels    Recommendations for Other Services       Precautions / Restrictions Precautions Precautions: Fall (telemetry) Restrictions Weight Bearing Restrictions: No      Mobility  Bed Mobility Overal bed mobility: Modified Independent             General bed mobility comments: uses elevated HOB  Transfers Overall transfer level: Modified independent Equipment used: Rolling walker (2 wheeled);1 person hand held assist             General transfer comment: pt is able to set up with supervision to sit and stand at walker  Ambulation/Gait Ambulation/Gait assistance: Min guard Ambulation Distance (Feet): 90 Feet Assistive device: Rolling walker (2 wheeled);1 person hand held assist Gait Pattern/deviations: Step-through pattern;Decreased stride length;Wide base of support Gait velocity: reduced Gait velocity interpretation: Below normal speed for age/gender General Gait Details: wide turns on the walker  Stairs            Wheelchair Mobility    Modified Rankin (Stroke Patients Only)       Balance Overall balance assessment: Needs assistance Sitting-balance support: Feet supported Sitting balance-Leahy Scale: Good   Postural control: Posterior lean Standing  balance support: Bilateral upper extremity supported Standing balance-Leahy Scale: Fair                               Pertinent Vitals/Pain Pain Assessment: 0-10 Pain Score: 8  Pain Location: B feet during gait but is 6 in feet during rest Pain Intervention(s): Limited activity within patient's tolerance;Monitored during session;Premedicated before session;Repositioned    Home Living Family/patient expects to be discharged to:: Private residence Living Arrangements: Other relatives Available Help at Discharge: Family;Available 24 hours/day Type of Home: House Home Access: Stairs to enter Entrance Stairs-Rails: None (has a rail but is broken per pt) Entrance Stairs-Number of Steps: 3 Home Layout: One level Home Equipment: Cane - single point      Prior Function Level of Independence: Independent with assistive device(s)               Hand Dominance   Dominant Hand: Right    Extremity/Trunk Assessment   Upper Extremity Assessment Upper Extremity Assessment: Overall WFL for tasks assessed    Lower Extremity Assessment Lower Extremity Assessment: Overall WFL for tasks assessed    Cervical / Trunk Assessment Cervical / Trunk Assessment: Normal  Communication   Communication: No difficulties  Cognition Arousal/Alertness: Awake/alert Behavior During Therapy: WFL for tasks assessed/performed Overall Cognitive Status: Within Functional Limits for tasks assessed                      General Comments General comments (skin integrity, edema, etc.): RW used for strict pain  management with better control of standing and balance    Exercises     Assessment/Plan    PT Assessment Patient needs continued PT services  PT Problem List Decreased range of motion;Decreased activity tolerance;Decreased balance;Decreased mobility;Decreased coordination;Decreased knowledge of use of DME;Decreased safety awareness;Decreased knowledge of precautions;Obesity           PT Treatment Interventions DME instruction;Gait training;Stair training;Functional mobility training;Therapeutic activities;Therapeutic exercise;Balance training;Neuromuscular re-education;Patient/family education    PT Goals (Current goals can be found in the Care Plan section)  Acute Rehab PT Goals Patient Stated Goal: to get his pain better PT Goal Formulation: With patient Time For Goal Achievement: 04/19/16 Potential to Achieve Goals: Good    Frequency Min 2X/week   Barriers to discharge Decreased caregiver support (no information about how helpful family can be)      Co-evaluation               End of Session Equipment Utilized During Treatment: Gait belt Activity Tolerance: Patient tolerated treatment well;Patient limited by pain Patient left: in bed;with call bell/phone within reach;with bed alarm set;Other (comment) (pt asked to sit side of bed and PT declined) Nurse Communication: Mobility status;Other (comment) (follow up plan)    Functional Assessment Tool Used: clinical judgment Functional Limitation: Mobility: Walking and moving around Mobility: Walking and Moving Around Current Status [phone]): At least 20 percent but less than 40 percent impaired, limited or restricted Mobility: Walking and Moving Around Goal Status [phone]): At least 1 percent but less than 20 percent impaired, limited or restricted    Time: 1024-1050 PT Time Calculation (min) (ACUTE ONLY): 26 min   Charges:   PT Evaluation $PT Eval Low Complexity: 1 Procedure PT Treatments $Gait Training: 8-22 mins   PT G Codes:   PT G-Codes **NOT FOR INPATIENT CLASS** Functional Assessment Tool Used: clinical judgment Functional Limitation: Mobility: Walking and moving around Mobility: Walking and Moving Around Current Status [phone]): At least 20 percent but less than 40 percent impaired, limited or restricted Mobility: Walking and Moving Around Goal Status [phone]): At least 1 percent but  less than 20 percent impaired, limited or restricted    Ramond Dial 04/05/2016, 10:59 AM  Mee Hives, PT MS Acute Rehab Dept. Number: [place] [facility] [place]

## 2016-04-05 NOTE — Consult Note (Signed)
Consult for b/l foot pain. Pt ambulating in hall and awaiting d/c. States doing much better. Can f/u with me in 2 weeks if needed.

## 2016-04-05 NOTE — Progress Notes (Signed)
Inpatient Diabetes Program Recommendations  AACE/ADA: New Consensus Statement on Inpatient Glycemic Control (2015)  Target Ranges:  Prepandial:   less than 140 mg/dL      Peak postprandial:   less than 180 mg/dL (1-2 hours)      Critically ill patients:  140 - 180 mg/dL   Results for Nathan, [name] (MRN [id number]) as of 04/05/2016 09:07  Ref. Range 04/04/2016 21:10 04/05/2016 07:29  Glucose-Capillary Latest Ref Range: 65 - 99 mg/dL 332 (H) 259 (H)    Admit with: Foot Pain  History: DM, Gout  Home DM Meds: Lantus 55 units daily       Metformin 1000 mg daily  Current Insulin Orders: Lantus 55 units daily                 Metformin 1000 mg daily     MD- Please consider placing orders for Novolog Moderate Correction Scale/ SSI (0-15 units) TID AC + HS     --Will follow patient during hospitalization--  Wyn Quaker RN, MSN, CDE Diabetes Coordinator Inpatient Glycemic Control Team Team Pager: [phone] (8a-5p)

## 2016-04-05 NOTE — Plan of Care (Addendum)
Dr. Text to inform of BS 356 and patient has no coverage to give.  Verbal orders given for moderate novolg sliding scale, plus 5U Novolog base with each meal.  Pharmacy requested to change on MAR so first dose could be given for lunch (today).  15U sliding scale given, plus 5U Nololg base.  Dr. Also intends to send patinet home on sliding scale and glucometer (which is new for patient). Also intends to get [facility] RN to continue education at home as needed. Patient and case management informed.

## 2016-04-05 NOTE — Care Management Note (Addendum)
Case Management Note  Patient Details  Name: Antwaun Buth MRN: [id number] Date of Birth: 1960/04/20  Subjective/Objective:  Met with patient at bedside. He is in need of education for diabetes with new insulin and glucometer. Met with patient and he has no agency  preference. Referral to [facility].  PCP is Dr. Clide Deutscher. Next appointment with PCP is Jan. 24.                  Action/Plan: Walker delivered today.   Expected Discharge Date:   04/05/2016               Expected Discharge Plan:  [place]  In-House Referral:     Discharge planning Services  CM Consult  Post Acute Care Choice:  Durable Medical Equipment Choice offered to:  Patient  DME Arranged:  Walker rolling DME Agency:  [facility] Arranged:  RN, Disease Management [place] Agency:  [place]  Status of Service:  Completed, signed off  If discussed at [place] of Stay Meetings, dates discussed:    Additional Comments:  Jolly Mango, RN 04/05/2016, 3:53 PM

## 2016-04-05 NOTE — Discharge Summary (Signed)
[facility] NAME: [name]    MR#:  [id number]  DATE OF BIRTH:  July 20, 1960  DATE OF ADMISSION:  04/04/2016 ADMITTING PHYSICIAN: Henreitta Leber, MD  DATE OF DISCHARGE: No discharge date for patient encounter.  PRIMARY CARE PHYSICIAN: AYCOCK, NGWE A, MD     ADMISSION DIAGNOSIS:  Diabetic neuropathy, painful ([place]) [E11.40] Acute gout of foot, unspecified cause, unspecified laterality [M10.9]  DISCHARGE DIAGNOSIS:  Principal Problem:   Bilateral foot pain Active Problems:   Hyponatremia   Anemia   Gout   SECONDARY DIAGNOSIS:   Past Medical History:  Diagnosis Date  . Diabetes mellitus without complication ([place])   . Gout   . Hypertension     .pro HOSPITAL COURSE:   Patient is 56 year old African-American male with past medical history significant for history of diabetes, gout, hypertension, who presents to the hospital with bilateral feet and knee pain worsening over the past 3 to 6 months. No significant swelling was noted. The peripheral pulses were normal. X-ray of left foot revealed old hindfoot trauma with advanced arthropathy and remodeling which appeared stable. Moderate spurring in the dorsal midfoot was stable.There was a stable inferior calcaneal spur.There was generalized soft tissue swelling. Right foot x-ray showed no acute fracture or subluxation. Mild hallux valgus deformity. Degenerative changes as described above. Plantar and posterior spurring of calcaneus. Patient was admitted to the hospital for further evaluation and treatment, was evaluated by physical therapist, who recommended no PT follow-up. Uric acid level was checked, it was found to be slightly elevated on admission at 7.7. Patient was initiated on prednisone, and recommended to continue prednisone with taper. He was felt to be stable to be discharged home today Discussion by problem: #1. Bilateral feet pain, likely gout  exacerbation, continue prednisone for the next 6 days, then taper medications, follow-up with podiatrist, Dr. Vickki Muff  as outpatient #2. Hyponatremia, patient received IV fluids in the hospital, reassess sodium level as outpatient, possibly related to Lasix #3 anemia, patient is on Mobic as outpatient, it is recommended to follow patient's hemoglobin level as outpatient, have Hemoccult checked as outpatient as well, referred to gastroenterologist if needed #4. Essential hypertension, well-controlled on current medications #5. Diabetes mellitus, continue outpatient indications, sliding scale insulin, short-acting insulin for meals, prescriptions are given, appreciate diabetic coordinator's/nursing staff input, prescription for glucometer was given  DISCHARGE CONDITIONS:   Stable  CONSULTS OBTAINED:  Treatment Team:  Samara Deist, DPM  DRUG ALLERGIES:  No Known Allergies  DISCHARGE MEDICATIONS:   Current Discharge Medication List    START taking these medications   Details  gabapentin (NEURONTIN) 100 MG capsule Take 1 capsule (100 mg total) by mouth 3 (three) times daily. Qty: 90 capsule, Refills: 5    HYDROcodone-acetaminophen (NORCO/VICODIN) 5-325 MG tablet Take 1-2 tablets by mouth every 4 (four) hours as needed for moderate pain. Qty: 30 tablet, Refills: 0    insulin aspart (NOVOLOG) 100 UNIT/ML injection Inject 5 Units into the skin 3 (three) times daily with meals. Please also use Novolog insulin  as sliding scale according to the chart you are given, thank you Qty: 20 mL, Refills: 11    predniSONE (STERAPRED UNI-PAK 21 TAB) 10 MG (21) TBPK tablet Take 1 tablet (10 mg total) by mouth daily. Please take 6 pills in the morning for the next 6 days, then taper by one pill every day until finished, thank you Qty: 51 tablet, Refills: 0  CONTINUE these medications which have NOT CHANGED   Details  amLODipine (NORVASC) 10 MG tablet Take 10 mg by mouth daily.    furosemide  (LASIX) 40 MG tablet Take 40 mg by mouth daily.    insulin glargine (LANTUS) 100 UNIT/ML injection Inject 55 Units into the skin daily.    lisinopril (PRINIVIL,ZESTRIL) 40 MG tablet Take 40 mg by mouth daily.    meloxicam (MOBIC) 7.5 MG tablet Take 7.5 mg by mouth daily.    metFORMIN (GLUCOPHAGE) 500 MG tablet Take 1,000 mg by mouth daily with breakfast.    metoprolol succinate (TOPROL-XL) 25 MG 24 hr tablet Take 25 mg by mouth daily.         DISCHARGE INSTRUCTIONS:    Patient is to follow-up with primary care physician and podiatrist as outpatient  If you experience worsening of your admission symptoms, develop shortness of breath, life threatening emergency, suicidal or homicidal thoughts you must seek medical attention immediately by calling 911 or calling your MD immediately  if symptoms less severe.  You Must read complete instructions/literature along with all the possible adverse reactions/side effects for all the Medicines you take and that have been prescribed to you. Take any new Medicines after you have completely understood and accept all the possible adverse reactions/side effects.   Please note  You were cared for by a hospitalist during your hospital stay. If you have any questions about your discharge medications or the care you received while you were in the hospital after you are discharged, you can call the unit and asked to speak with the hospitalist on call if the hospitalist that took care of you is not available. Once you are discharged, your primary care physician will handle any further medical issues. Please note that NO REFILLS for any discharge medications will be authorized once you are discharged, as it is imperative that you return to your primary care physician (or establish a relationship with a primary care physician if you do not have one) for your aftercare needs so that they can reassess your need for medications and monitor your lab  values.    Today   CHIEF COMPLAINT:   Chief Complaint  Patient presents with  . Foot Pain    HISTORY OF PRESENT ILLNESS:  [name]  is a 56 y.o. male with a known history of diabetes, gout, hypertension, who presents to the hospital with bilateral feet and knee pain worsening over the past 3 to 6 months. No significant swelling was noted. The peripheral pulses were normal. X-ray of left foot revealed old hindfoot trauma with advanced arthropathy and remodeling which appeared stable. Moderate spurring in the dorsal midfoot was stable.There was a stable inferior calcaneal spur.There was generalized soft tissue swelling. Right foot x-ray showed no acute fracture or subluxation. Mild hallux valgus deformity. Degenerative changes as described above. Plantar and posterior spurring of calcaneus. Patient was admitted to the hospital for further evaluation and treatment, was evaluated by physical therapist, who recommended no PT follow-up. Uric acid level was checked, it was found to be slightly elevated on admission at 7.7. Patient was initiated on prednisone, and recommended to continue prednisone with taper. He was felt to be stable to be discharged home today Discussion by problem: #1. Bilateral feet pain, likely gout exacerbation, continue prednisone for the next 6 days, then taper medications, follow-up with podiatrist, Dr. Vickki Muff  as outpatient #2. Hyponatremia, patient received IV fluids in the hospital, reassess sodium level as outpatient, possibly related to  Lasix #3 anemia, patient is on Mobic as outpatient, it is recommended to follow patient's hemoglobin level as outpatient, have Hemoccult checked as outpatient as well, referred to gastroenterologist if needed #4. Essential hypertension, well-controlled on current medications #5. Diabetes mellitus, continue outpatient indications, sliding scale insulin, short-acting insulin for meals, prescriptions are given, appreciate diabetic  coordinator's/nursing staff input, prescription for glucometer was given   VITAL SIGNS:  Blood pressure 128/79, pulse 67, temperature 98 F (36.7 C), temperature source Oral, resp. rate 16, height 5\' 10"  (1.778 m), weight 102.5 kg (226 lb), SpO2 100 %.  I/O:   Intake/Output Summary (Last 24 hours) at 04/05/16 1535 Last data filed at 04/05/16 1330  Gross per 24 hour  Intake              360 ml  Output             1300 ml  Net             -940 ml    PHYSICAL EXAMINATION:  GENERAL:  56 y.o.-year-old patient lying in the bed with no acute distress.  EYES: Pupils equal, round, reactive to light and accommodation. No scleral icterus. Extraocular muscles intact.  HEENT: Head atraumatic, normocephalic. Oropharynx and nasopharynx clear.  NECK:  Supple, no jugular venous distention. No thyroid enlargement, no tenderness.  LUNGS: Normal breath sounds bilaterally, no wheezing, rales,rhonchi or crepitation. No use of accessory muscles of respiration.  CARDIOVASCULAR: S1, S2 normal. No murmurs, rubs, or gallops.  ABDOMEN: Soft, non-tender, non-distended. Bowel sounds present. No organomegaly or mass.  EXTREMITIES: No pedal edema, cyanosis, or clubbing.  NEUROLOGIC: Cranial nerves II through XII are intact. Muscle strength 5/5 in all extremities. Sensation intact. Gait not checked.  PSYCHIATRIC: The patient is alert and oriented x 3.  SKIN: No obvious rash, lesion, or ulcer.   DATA REVIEW:   CBC  Recent Labs Lab 04/04/16 0951  WBC 8.0  HGB 11.5*  HCT 34.2*  PLT 341    Chemistries   Recent Labs Lab 04/04/16 0843  NA 133*  K 4.0  CL 101  CO2 24  GLUCOSE 178*  BUN 13  CREATININE 0.84  CALCIUM 9.5  AST 30  ALT 35  ALKPHOS 59  BILITOT 0.6    Cardiac Enzymes No results for input(s): TROPONINI in the last 168 hours.  Microbiology Results  No results found for this or any previous visit.  RADIOLOGY:  Dg Foot Complete Left  Result Date: 04/04/2016 CLINICAL DATA:  Chronic  pain EXAM: LEFT FOOT - COMPLETE 3+ VIEW COMPARISON:  Left ankle January 21, 2015 FINDINGS: Frontal, oblique, lateral views were obtained. There is evidence of prior trauma involving the calcaneus with extensive bony remodeling. There is old trauma to the distal tibia and fibula with screw fixation in the distal tibia. There is advanced arthropathy throughout the hindfoot, particularly involving the subtalar joints which appears grossly stable. There is moderate spurring in the dorsal midfoot which appears stable. There is an old fracture with remodeling involving the fifth proximal phalanx. No acute fracture or dislocation is evident. There is mild generalized soft tissue swelling. There is no erosive change or bony destruction evident. There is mild narrowing of all PIP and DIP joints as well as the first MTP joint. There is a rather prominent inferior calcaneal spur. IMPRESSION: Old hindfoot trauma with advanced arthropathy and remodeling which appears stable. Moderate spurring in the dorsal midfoot is stable. There is a stable inferior calcaneal spur. There is generalized soft  tissue swelling. There is narrowing of all PIP and DIP joints as well as the first MTP joint. No erosive change or bony destruction evident. No acute fracture or dislocation. Old trauma with remodeling fifth proximal phalanx. Electronically Signed   By: Lowella Grip III M.D.   On: 04/04/2016 08:49   Dg Foot Complete Right  Result Date: 04/04/2016 CLINICAL DATA:  Bilateral foot pain EXAM: RIGHT FOOT COMPLETE - 3+ VIEW COMPARISON:  08/05/2011 FINDINGS: Three views of the right foot submitted. Mild hallux valgus deformity. No acute fracture or subluxation. Mild degenerative changes first metatarsal phalangeal joint. Dorsal spurring tarsal region. There is plantar and posterior spurring of calcaneus. IMPRESSION: No acute fracture or subluxation. Mild hallux valgus deformity. Degenerative changes as described above. Plantar and posterior  spurring of calcaneus. Electronically Signed   By: Lahoma Crocker M.D.   On: 04/04/2016 08:48    EKG:   Orders placed or performed in visit on 08/22/12  . EKG 12-Lead      Management plans discussed with the patient, family and they are in agreement.  CODE STATUS:     Code Status Orders        Start     Ordered   04/04/16 1649  Full code  Continuous     04/04/16 1648    Code Status History    Date Active Date Inactive Code Status Order ID Comments User Context   This patient has a current code status but no historical code status.      TOTAL TIME TAKING CARE OF THIS PATIENT: 40 minutes.    Theodoro Grist M.D on 04/05/2016 at 3:35 PM  Between 7am to 6pm - Pager - [phone]  After 6pm go to [email or web address] - password [email or web address] [facility]  Office  [phone]  CC: Primary care physician; Donnie Coffin, MD

## 2016-04-05 NOTE — Plan of Care (Addendum)
Dr. Text to inform - After Given 20U novolog at lunch, BS is still 304.  Also confirming patient is still to be discharged.  Dr. Annie Main call to confirm patient is being discharged.

## 2016-11-19 ENCOUNTER — Emergency Department: Payer: Medicaid Other

## 2016-11-19 ENCOUNTER — Encounter: Payer: Self-pay | Admitting: Emergency Medicine

## 2016-11-19 DIAGNOSIS — E119 Type 2 diabetes mellitus without complications: Secondary | ICD-10-CM | POA: Insufficient documentation

## 2016-11-19 DIAGNOSIS — M1712 Unilateral primary osteoarthritis, left knee: Secondary | ICD-10-CM | POA: Diagnosis not present

## 2016-11-19 DIAGNOSIS — Z79899 Other long term (current) drug therapy: Secondary | ICD-10-CM | POA: Insufficient documentation

## 2016-11-19 DIAGNOSIS — M25462 Effusion, left knee: Secondary | ICD-10-CM | POA: Diagnosis not present

## 2016-11-19 DIAGNOSIS — Z791 Long term (current) use of non-steroidal anti-inflammatories (NSAID): Secondary | ICD-10-CM | POA: Insufficient documentation

## 2016-11-19 DIAGNOSIS — M25562 Pain in left knee: Secondary | ICD-10-CM | POA: Insufficient documentation

## 2016-11-19 DIAGNOSIS — Z794 Long term (current) use of insulin: Secondary | ICD-10-CM | POA: Insufficient documentation

## 2016-11-19 DIAGNOSIS — F17210 Nicotine dependence, cigarettes, uncomplicated: Secondary | ICD-10-CM | POA: Diagnosis not present

## 2016-11-19 DIAGNOSIS — I10 Essential (primary) hypertension: Secondary | ICD-10-CM | POA: Insufficient documentation

## 2016-11-19 NOTE — ED Triage Notes (Signed)
Patient brought in by ems from home. Patient with complaint of left knee pain times three weeks. Patient states that he is unsure of injury.

## 2016-11-20 ENCOUNTER — Telehealth: Payer: Self-pay | Admitting: Emergency Medicine

## 2016-11-20 ENCOUNTER — Emergency Department
Admission: EM | Admit: 2016-11-20 | Discharge: 2016-11-20 | Disposition: A | Discharge status: Home / Self Care | Payer: Medicaid Other | Attending: Emergency Medicine | Admitting: Emergency Medicine

## 2016-11-20 DIAGNOSIS — M25462 Effusion, left knee: Secondary | ICD-10-CM

## 2016-11-20 DIAGNOSIS — M1712 Unilateral primary osteoarthritis, left knee: Secondary | ICD-10-CM

## 2016-11-20 DIAGNOSIS — M25562 Pain in left knee: Secondary | ICD-10-CM

## 2016-11-20 MED ORDER — ETODOLAC 200 MG PO CAPS: 200.0000 mg | ORAL_CAPSULE | Freq: Three times a day (TID) | ORAL | 0 refills | Status: DC

## 2016-11-20 MED ORDER — LIDOCAINE 5 % EX PTCH: 1.0000 | MEDICATED_PATCH | Freq: Two times a day (BID) | CUTANEOUS | 0 refills | Status: DC

## 2016-11-20 MED ORDER — COLCHICINE 0.6 MG PO TABS
0.6000 mg | ORAL_TABLET | Freq: Once | ORAL | Status: AC
  Administered 2016-11-20: 0.6 mg via ORAL
  Filled 2016-11-20 (×2): qty 1

## 2016-11-20 MED ORDER — KETOROLAC TROMETHAMINE 60 MG/2ML IM SOLN
60.0000 mg | Freq: Once | INTRAMUSCULAR | Status: AC
  Administered 2016-11-20: 60 mg via INTRAMUSCULAR
  Filled 2016-11-20: qty 2

## 2016-11-20 MED ORDER — LIDOCAINE 5 % EX PTCH
1.0000 | MEDICATED_PATCH | CUTANEOUS | Status: DC
  Administered 2016-11-20: 1 via TRANSDERMAL
  Filled 2016-11-20: qty 1

## 2016-11-20 NOTE — Telephone Encounter (Signed)
Pharmacy called due to lidocaine patches come in box of 30 and they do not break the box.  Per dr Quentin Cornwall can chang quantity on rx to 30 patches

## 2016-11-20 NOTE — ED Notes (Signed)
Pt was asleep with no visible distress upon RN entry into room. Pt woken up by this RN and pt stated pain 10/10. MD Dahlia Client aware.
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
## 2016-11-20 NOTE — Discharge Instructions (Signed)
PLease follow up with orthopedic surgery for further evaluation of your left knee pain. Please ice and elevate

## 2016-12-15 ENCOUNTER — Emergency Department
Admission: EM | Admit: 2016-12-15 | Discharge: 2016-12-15 | Disposition: A | Discharge status: Home / Self Care | Payer: Medicaid Other | Attending: Emergency Medicine | Admitting: Emergency Medicine

## 2016-12-15 DIAGNOSIS — M25462 Effusion, left knee: Secondary | ICD-10-CM

## 2016-12-15 DIAGNOSIS — E119 Type 2 diabetes mellitus without complications: Secondary | ICD-10-CM | POA: Diagnosis not present

## 2016-12-15 DIAGNOSIS — M25562 Pain in left knee: Secondary | ICD-10-CM

## 2016-12-15 DIAGNOSIS — Z794 Long term (current) use of insulin: Secondary | ICD-10-CM | POA: Insufficient documentation

## 2016-12-15 DIAGNOSIS — F17210 Nicotine dependence, cigarettes, uncomplicated: Secondary | ICD-10-CM | POA: Insufficient documentation

## 2016-12-15 DIAGNOSIS — Z79899 Other long term (current) drug therapy: Secondary | ICD-10-CM | POA: Diagnosis not present

## 2016-12-15 DIAGNOSIS — Z96651 Presence of right artificial knee joint: Secondary | ICD-10-CM | POA: Diagnosis not present

## 2016-12-15 MED ORDER — IBUPROFEN 800 MG PO TABS: 800.0000 mg | ORAL_TABLET | Freq: Three times a day (TID) | ORAL | 0 refills | Status: DC | PRN

## 2016-12-15 MED ORDER — DEXAMETHASONE SODIUM PHOSPHATE 10 MG/ML IJ SOLN
10.0000 mg | Freq: Once | INTRAMUSCULAR | Status: AC
  Administered 2016-12-15: 10 mg via INTRAMUSCULAR
  Filled 2016-12-15: qty 1

## 2016-12-15 MED ORDER — KETOROLAC TROMETHAMINE 30 MG/ML IJ SOLN
60.0000 mg | Freq: Once | INTRAMUSCULAR | Status: AC
  Administered 2016-12-15: 60 mg via INTRAMUSCULAR
  Filled 2016-12-15: qty 2

## 2016-12-15 MED ORDER — HYDROCODONE-ACETAMINOPHEN 5-325 MG PO TABS: 1.0000 | ORAL_TABLET | Freq: Four times a day (QID) | ORAL | 0 refills | Status: DC | PRN

## 2016-12-15 MED ORDER — HYDROCODONE-ACETAMINOPHEN 5-325 MG PO TABS
1.0000 | ORAL_TABLET | Freq: Once | ORAL | Status: AC
  Administered 2016-12-15: 1 via ORAL
  Filled 2016-12-15: qty 1

## 2016-12-15 NOTE — ED Notes (Signed)
ED Provider at bedside. 

## 2016-12-15 NOTE — ED Provider Notes (Signed)
[facility] Emergency Department Provider Note   ____________________________________________   First MD Initiated Contact with Patient 12/15/16 [phone]     (approximate)  I have reviewed the triage vital signs and the nursing notes.   HISTORY  Chief Complaint Knee Pain (left)    HPI [name] is a 56 y.o. male brought to the ED from home via EMS with a chief complaint of left knee pain. Patient reports nontraumatic left knee pain 6 weeks. He was seen in the ED for same 3 weeks ago, given a shot of Toradol and prescribed Lidoderm patches. Patient reports he was unable to fill the Lidoderm patches secondary to expenses. States he made his follow-up appointment with orthopedics but it is not until October 2. Denies fever, chills, chest pain, shortness of breath, abdominal pain, nausea, vomiting. Nothing makes his pain better. Movement makes his knee pain worse.Denies recent travel or trauma.   Past Medical History:  Diagnosis Date  . Diabetes mellitus without complication ([place])   . Gout   . Hypertension     Patient Active Problem List   Diagnosis Date Noted  . Hyponatremia 04/05/2016  . Anemia 04/05/2016  . Gout 04/05/2016  . Bilateral foot pain 04/04/2016    Past Surgical History:  Procedure Laterality Date  . JOINT REPLACEMENT     right knee    Prior to Admission medications   Medication Sig Start Date End Date Taking? Authorizing Provider  amLODipine (NORVASC) 10 MG tablet Take 10 mg by mouth daily.    [provider]  etodolac (LODINE) 200 MG capsule Take 1 capsule (200 mg total) by mouth every 8 (eight) hours. 11/20/16   Loney Hering, MD  furosemide (LASIX) 40 MG tablet Take 40 mg by mouth daily.    [provider]  gabapentin (NEURONTIN) 100 MG capsule Take 1 capsule (100 mg total) by mouth 3 (three) times daily. 04/05/16   Theodoro Grist, MD  glucose blood test strip Use as instructed 04/05/16   Theodoro Grist, MD  HYDROcodone-acetaminophen (NORCO) 5-325 MG tablet Take 1 tablet by mouth every 6 (six) hours as needed for moderate pain. 12/15/16   Paulette Blanch, MD  ibuprofen (ADVIL,MOTRIN) 800 MG tablet Take 1 tablet (800 mg total) by mouth every 8 (eight) hours as needed for moderate pain. 12/15/16   Paulette Blanch, MD  insulin aspart (NOVOLOG) 100 UNIT/ML injection Inject 5 Units into the skin 3 (three) times daily with meals. Please also use Novolog insulin  as sliding scale according to the chart you are given, thank you 04/05/16   Theodoro Grist, MD  insulin glargine (LANTUS) 100 UNIT/ML injection Inject 55 Units into the skin daily.    [provider]  Insulin Syringe-Needle U-100 25G X 1" 1 ML MISC Any brand, for 4 times a day insulin SQ, 1 month supply. 04/05/16   Theodoro Grist, MD  Lancets (ACCU-CHEK MULTICLIX) lancets Use as instructed 04/05/16   Theodoro Grist, MD  lidocaine (LIDODERM) 5 % Place 1 patch onto the skin every 12 (twelve) hours. Remove & Discard patch within 12 hours or as directed by MD 11/20/16 11/20/17  Loney Hering, MD  lisinopril (PRINIVIL,ZESTRIL) 40 MG tablet Take 40 mg by mouth daily.    [provider]  meloxicam (MOBIC) 7.5 MG tablet Take 7.5 mg by mouth daily.    [provider]  metFORMIN (GLUCOPHAGE) 500 MG tablet Take 1,000 mg by mouth daily with breakfast.    [provider]  metoprolol succinate (TOPROL-XL) 25 MG 24 hr tablet Take 25 mg by mouth daily.    [provider]  predniSONE (STERAPRED UNI-PAK 21 TAB) 10 MG (21) TBPK tablet Take 1 tablet (10 mg total) by mouth daily. Please take 6 pills in the morning for the next 6 days, then taper by one pill every day until finished, thank you 04/05/16   Theodoro Grist, MD    Allergies Patient has no known allergies.  Family History  Problem Relation Age of Onset  . Diabetes Mother     Social History Social History  Substance Use Topics  . Smoking status: Current Every Day  Smoker    Packs/day: 1.00    Years: 50.00    Types: Cigarettes  . Smokeless tobacco: Never Used  . Alcohol use 2.4 oz/week    4 Cans of beer per week     Comment: 4 40 oz beer daily    Review of Systems  Constitutional: No fever/chills. Eyes: No visual changes. ENT: No sore throat. Cardiovascular: Denies chest pain. Respiratory: Denies shortness of breath. Gastrointestinal: No abdominal pain.  No nausea, no vomiting.  No diarrhea.  No constipation. Genitourinary: Negative for dysuria. Musculoskeletal: positive for left knee pain. Negative for back pain. Skin: Negative for rash. Neurological: Negative for headaches, focal weakness or numbness.   ____________________________________________   PHYSICAL EXAM:  VITAL SIGNS: ED Triage Vitals  Enc Vitals Group     BP 12/15/16 0554 (!) 175/100     Pulse Rate 12/15/16 0554 74     Resp 12/15/16 0554 19     Temp 12/15/16 0554 98.3 F (36.8 C)     Temp Source 12/15/16 0554 Oral     SpO2 12/15/16 0553 99 %     Weight 12/15/16 0555 223 lb (101.2 kg)     Height 12/15/16 0555 5\' 7"  (1.702 m)     Head Circumference --      Peak Flow --      Pain Score 12/15/16 0554 10     Pain Loc --      Pain Edu? --      Excl. in [place]? --     Constitutional: Alert and oriented. Well appearing and in no acute distress. Eyes: Conjunctivae are normal. PERRL. EOMI. Head: Atraumatic. Nose: No congestion/rhinnorhea. Mouth/Throat: Mucous membranes are moist.  Oropharynx non-erythematous. Neck: No stridor.  No cervical spine tenderness to palpation. Cardiovascular: Normal rate, regular rhythm. Grossly normal heart sounds.  Good peripheral circulation. Respiratory: Normal respiratory effort.  No retractions. Lungs CTAB. Gastrointestinal: Soft and nontender. No distention. No abdominal bruits. No CVA tenderness. Musculoskeletal:  LLE: mild knee effusion. Tender to palpation anteriorly. wear a knee brace. Joint is not warm or erythematous. Decreased  range of motion secondary to pain. 2+ femoral distal pulses. Brisk, less than 5 second capillary refill. No pedal edema.  Neurologic:  Normal speech and language. No gross focal neurologic deficits are appreciated.  Skin:  Skin is warm, dry and intact. No rash noted. Psychiatric: Mood and affect are normal. Speech and behavior are normal.  ____________________________________________   LABS (all labs ordered are listed, but only abnormal results are displayed)  Labs Reviewed - No data to display ____________________________________________  EKG  none ____________________________________________  RADIOLOGY  No results found.  ____________________________________________   PROCEDURES  Procedure(s) performed: None  Procedures  Critical Care performed: No  ____________________________________________   INITIAL IMPRESSION / ASSESSMENT AND PLAN / ED COURSE  Pertinent labs & imaging results  that were available during my care of the patient were reviewed by me and considered in my medical decision making (see chart for details).  56 year old male with nontraumatic left knee pain 6 weeks. Also has a history of gout. He is a diabetic; will give IM Decadron as to not increase his blood sugars too much, ibuprofen, Norco. He is currently scheduled for orthopedics follow-up on October 2. Strict return precautions given. Patient verbalizes understanding and agrees with plan of care.      ____________________________________________   FINAL CLINICAL IMPRESSION(S) / ED DIAGNOSES  Final diagnoses:  Effusion of left knee  Acute pain of left knee      NEW MEDICATIONS STARTED DURING THIS VISIT:  New Prescriptions   HYDROCODONE-ACETAMINOPHEN (NORCO) 5-325 MG TABLET    Take 1 tablet by mouth every 6 (six) hours as needed for moderate pain.   IBUPROFEN (ADVIL,MOTRIN) 800 MG TABLET    Take 1 tablet (800 mg total) by mouth every 8 (eight) hours as needed for moderate pain.      Note:  This document was prepared using Dragon voice recognition software and may include unintentional dictation errors.    Paulette Blanch, MD 12/15/16 [phone]

## 2016-12-15 NOTE — Discharge Instructions (Signed)
1. You may take pain medicines as needed (Motrin/Norco #15). 2. Continue knee brace, heat and ice. 3. Return to the ER for worsening symptoms, persistent vomiting, difficulty breathing or other concerns.

## 2016-12-15 NOTE — ED Triage Notes (Signed)
Patient c/o left knee pain X 3 weeks. Pt reports being seen in this ED for same and dx with arthritis.  Pt reports his follow-up appoint is in October and pain too severe to wait. Pt reports pain now radiates down to foot.

## 2016-12-15 NOTE — ED Notes (Signed)
Reviewed d/c instructions, follow-up care, prescriptions with patient. Pt verbalized understanding.  Patient wheeled to lobby to wait for ride.

## 2016-12-29 ENCOUNTER — Emergency Department: Payer: Medicaid Other

## 2016-12-29 ENCOUNTER — Emergency Department
Admission: EM | Admit: 2016-12-29 | Discharge: 2016-12-29 | Disposition: A | Discharge status: Home / Self Care | Payer: Medicaid Other | Attending: Emergency Medicine | Admitting: Emergency Medicine

## 2016-12-29 DIAGNOSIS — E1165 Type 2 diabetes mellitus with hyperglycemia: Secondary | ICD-10-CM | POA: Insufficient documentation

## 2016-12-29 DIAGNOSIS — F17210 Nicotine dependence, cigarettes, uncomplicated: Secondary | ICD-10-CM | POA: Diagnosis not present

## 2016-12-29 DIAGNOSIS — I10 Essential (primary) hypertension: Secondary | ICD-10-CM | POA: Insufficient documentation

## 2016-12-29 DIAGNOSIS — R2242 Localized swelling, mass and lump, left lower limb: Secondary | ICD-10-CM | POA: Insufficient documentation

## 2016-12-29 DIAGNOSIS — R739 Hyperglycemia, unspecified: Secondary | ICD-10-CM

## 2016-12-29 DIAGNOSIS — M79662 Pain in left lower leg: Secondary | ICD-10-CM | POA: Diagnosis present

## 2016-12-29 DIAGNOSIS — E871 Hypo-osmolality and hyponatremia: Secondary | ICD-10-CM | POA: Insufficient documentation

## 2016-12-29 LAB — URINALYSIS, COMPLETE (UACMP) WITH MICROSCOPIC
Bacteria, UA: NONE SEEN
Bilirubin Urine: NEGATIVE
Hgb urine dipstick: NEGATIVE
Ketones, ur: NEGATIVE mg/dL
Leukocytes, UA: NEGATIVE
Nitrite: NEGATIVE
PH: 5 (ref 5.0–8.0)
Protein, ur: NEGATIVE mg/dL
RBC / HPF: NONE SEEN RBC/hpf (ref 0–5)
Specific Gravity, Urine: 1.03 (ref 1.005–1.030)

## 2016-12-29 LAB — GLUCOSE, CAPILLARY
Glucose-Capillary: 492 mg/dL — ABNORMAL HIGH (ref 65–99)
Glucose-Capillary: 223 mg/dL — ABNORMAL HIGH (ref 65–99)

## 2016-12-29 LAB — BASIC METABOLIC PANEL
Anion gap: 9 (ref 5–15)
BUN: 13 mg/dL (ref 6–20)
CALCIUM: 9 mg/dL (ref 8.9–10.3)
CO2: 25 mmol/L (ref 22–32)
CREATININE: 0.79 mg/dL (ref 0.61–1.24)
Chloride: 97 mmol/L — ABNORMAL LOW (ref 101–111)
GFR calc non Af Amer: >60 mL/min (ref >60)
Glucose, Bld: 497 mg/dL — ABNORMAL HIGH (ref 65–99)
Potassium: 4.1 mmol/L (ref 3.5–5.1)
Sodium: 131 mmol/L — ABNORMAL LOW (ref 135–145)

## 2016-12-29 LAB — CBC
HCT: 31.4 % — ABNORMAL LOW (ref 40.0–52.0)
Hemoglobin: 10.6 g/dL — ABNORMAL LOW (ref 13.0–18.0)
MCH: 30.5 pg (ref 26.0–34.0)
MCHC: 33.6 g/dL (ref 32.0–36.0)
MCV: 90.7 fL (ref 80.0–100.0)
Platelets: 448 10*3/uL — ABNORMAL HIGH (ref 150–440)
RBC: 3.46 MIL/uL — AB (ref 4.40–5.90)
RDW: 13.1 % (ref 11.5–14.5)
WBC: 6.4 10*3/uL (ref 3.8–10.6)

## 2016-12-29 MED ORDER — SODIUM CHLORIDE 0.9 % IV BOLUS (SEPSIS)
1000.0000 mL | Freq: Once | INTRAVENOUS | Status: AC
  Administered 2016-12-29: 1000 mL via INTRAVENOUS

## 2016-12-29 MED ORDER — OXYCODONE-ACETAMINOPHEN 5-325 MG PO TABS
1.0000 | ORAL_TABLET | Freq: Once | ORAL | Status: AC
  Administered 2016-12-29: 1 via ORAL
  Filled 2016-12-29: qty 1

## 2016-12-29 MED ORDER — INSULIN ASPART 100 UNIT/ML ~~LOC~~ SOLN
10.0000 [IU] | Freq: Once | SUBCUTANEOUS | Status: AC
  Administered 2016-12-29: 10 [IU] via INTRAVENOUS
  Filled 2016-12-29: qty 1

## 2016-12-29 NOTE — ED Triage Notes (Signed)
Pt c/o left knee and ankle pain and swelling. Dx with arthritis. States that his followup is in October. Out of pain medications.   Pt CBG over 500 when checked by EMS.

## 2016-12-29 NOTE — Discharge Instructions (Signed)
You do not have any evidence of blood clot in your leg. Please keep your left leg elevated as much as possible when you are not standing. Please purchase compression stockings to help with your swelling. Make a follow-up appointment with your primary care physician.  Return to the emergency department if you develop severe pain, chest pain, shortness of breath, fever, redness or warmth over your leg, or any other symptoms concerning to you.

## 2016-12-29 NOTE — ED Provider Notes (Signed)
[facility] Emergency Department Provider Note  ____________________________________________  Time seen: Approximately 7:09 PM  I have reviewed the triage vital signs and the nursing notes.   HISTORY  Chief Complaint Leg Pain and Hyperglycemia    HPI [name] is a 56 y.o. male with a history of diabetes, HTN, gout, presenting with left lower extremity pain and swelling. The patient reports that for the past several months, his had left knee and leg pain. He has been seen in the emergency department multiple times, but today he has associated swelling from below the knee into the foot. He states that sometimes his right ankle also swells but it is normal today. In the past, his pain has been attributed to arthritis. He does have a history of left ankle fracture, but has not sustained any trauma related to his complaints today. The patient denies any calf pain, history of blood clots, chest pain or shortness of breath. He has not had any fever or chills. He has not noted any skin changes including erythema, abrasions or lacerations over the left lower extremity. He denies any associated numbness, tingling or weakness. He patient has not tried any compression stockings or other modalities to help his pain.the patient is markedly hyperglycemic today and states that he has not taken his medication "it must have slipped my mind." He denies any medical complaints associated with this including nausea or vomiting, abdominal pain, urinary sx's, fever or chills.   Past Medical History:  Diagnosis Date  . Diabetes mellitus without complication ([place])   . Gout   . Hypertension     Patient Active Problem List   Diagnosis Date Noted  . Hyponatremia 04/05/2016  . Anemia 04/05/2016  . Gout 04/05/2016  . Bilateral foot pain 04/04/2016    Past Surgical History:  Procedure Laterality Date  . JOINT REPLACEMENT     right knee    Current Outpatient Rx  . Order #:  [id number] Class: Historical Med  . Order #: [id number] Class: Print  . Order #: [id number] Class: Historical Med  . Order #: [id number] Class: Normal  . Order #: [id number] Class: Normal  . Order #: [id number] Class: Print  . Order #: [id number] Class: Print  . Order #: [id number] Class: Normal  . Order #: [id number] Class: Historical Med  . Order #: [id number] Class: Print  . Order #: [id number] Class: Normal  . Order #: [id number] Class: Print  . Order #: [id number] Class: Historical Med  . Order #: [id number] Class: Historical Med  . Order #: [id number] Class: Historical Med  . Order #: [id number] Class: Historical Med  . Order #: [id number] Class: Normal    Allergies Patient has no known allergies.  Family History  Problem Relation Age of Onset  . Diabetes Mother     Social History Social History  Substance Use Topics  . Smoking status: Current Every Day Smoker    Packs/day: 1.00    Years: 50.00    Types: Cigarettes  . Smokeless tobacco: Never Used  . Alcohol use 2.4 oz/week    4 Cans of beer per week     Comment: 4 40 oz beer daily    Review of Systems Constitutional: No fever/chills.no lightheadedness or syncope. Eyes: No visual changes. ENT: No sore throat. No congestion or rhinorrhea. Cardiovascular: Denies chest pain. Denies palpitations. Respiratory: Denies shortness of breath.  No cough. Gastrointestinal: No abdominal pain.  No nausea, no vomiting.  No diarrhea.  No constipation. Genitourinary: Negative for dysuria. Musculoskeletal: Negative for back pain.bilateral lower extremity swelling, worse on the left. Skin: Negative for  rash. Neurological: Negative for headaches. No focal numbness, tingling or weakness.  Endocrine:positive hyperglycemia.  ____________________________________________   PHYSICAL EXAM:  VITAL SIGNS: ED Triage Vitals [12/29/16 1607]  Enc Vitals Group     BP (!) 144/71     Pulse Rate (!) 58     Resp 18     Temp 98 F (36.7 C)     Temp Source Oral      SpO2 99 %     Weight 223 lb (101.2 kg)     Height 5\' 7"  (1.702 m)     Head Circumference      Peak Flow      Pain Score 10     Pain Loc      Pain Edu?      Excl. in [place]?     Constitutional: Alert and oriented. Chronically ill appearing but in no acute distress. Answers questions appropriately. Eyes: Conjunctivae are normal.  EOMI. No scleral icterus. Head: Atraumatic. Nose: No congestion/rhinnorhea. Mouth/Throat: Mucous membranes are moist.  Neck: No stridor.  Supple.  No JVD. No meningismus. Cardiovascular: Normal rate, regular rhythm. No murmurs, rubs or gallops.  Respiratory: Normal respiratory effort.  No accessory muscle use or retractions. Lungs CTAB.  No wheezes, rales or ronchi. Gastrointestinal: morbidly obese. Soft, nontender and nondistended.  No guarding or rebound.  No peritoneal signs. Musculoskeletal: the patient has edema in the left lower extremity from below the kneethrough the ankle and into the foot. He has full range of motion of the bilateral hips, knees, and ankles with some pain in the left knee and ankle. He has normal DP and PT pulses on the left. He has normal sensation to light touch in the bilateral lower extremity's. There is no evidence of swelling in the right lower extremity, there are no palpable cordsin the calves bilaterally. Neurologic:  A&Ox3.  Speech is clear.  Face and smile are symmetric.  EOMI.  Moves all extremities well. Skin:  Skin is warm, dry and intact. No rash noted. Psychiatric: Mood and affect are normal.  ____________________________________________   LABS (all labs ordered are listed, but only abnormal results are displayed)  Labs Reviewed  BASIC METABOLIC PANEL - Abnormal; Notable for the following:       Result Value   Sodium 131 (*)    Chloride 97 (*)    Glucose, Bld 497 (*)    All other components within normal limits  CBC - Abnormal; Notable for the following:    RBC 3.46 (*)    Hemoglobin 10.6 (*)    HCT 31.4 (*)     Platelets 448 (*)    All other components within normal limits  URINALYSIS, COMPLETE (UACMP) WITH MICROSCOPIC - Abnormal; Notable for the following:    Color, Urine STRAW (*)    APPearance CLEAR (*)    Glucose, UA >=500 (*)    Squamous Epithelial / LPF 0-5 (*)    All other components within normal limits  GLUCOSE, CAPILLARY - Abnormal; Notable for the following:    Glucose-Capillary 492 (*)    All other components within normal limits  GLUCOSE, CAPILLARY - Abnormal; Notable for the following:    Glucose-Capillary 223 (*)    All other components within normal limits  CBG MONITORING, ED   ____________________________________________  EKG  Not indicated ____________________________________________  RADIOLOGY  US Venous Img Lower Unilateral Left  Result Date: 12/29/2016 CLINICAL DATA:  Left leg swelling for 6 months. EXAM: LEFT LOWER EXTREMITY VENOUS DOPPLER ULTRASOUND TECHNIQUE: Gray-scale  sonography with graded compression, as well as color Doppler and duplex ultrasound were performed to evaluate the lower extremity deep venous systems from the level of the common femoral vein and including the common femoral, femoral, profunda femoral, popliteal and calf veins including the posterior tibial, peroneal and gastrocnemius veins when visible. The superficial great saphenous vein was also interrogated. Spectral Doppler was utilized to evaluate flow at rest and with distal augmentation maneuvers in the common femoral, femoral and popliteal veins. COMPARISON:  None. FINDINGS: Contralateral Common Femoral Vein: Respiratory phasicity is normal and symmetric with the symptomatic side. No evidence of thrombus. Normal compressibility. Common Femoral Vein: No evidence of thrombus. Normal compressibility, respiratory phasicity and response to augmentation. Saphenofemoral Junction: No evidence of thrombus. Normal compressibility and flow on color Doppler imaging. Profunda Femoral Vein: No evidence of  thrombus. Normal compressibility and flow on color Doppler imaging. Femoral Vein: No evidence of thrombus. Normal compressibility, respiratory phasicity and response to augmentation. Popliteal Vein: No evidence of thrombus. Normal compressibility, respiratory phasicity and response to augmentation. Calf Veins: No evidence of thrombus. Normal compressibility and flow on color Doppler imaging. Superficial Great Saphenous Vein: No evidence of thrombus. Normal compressibility and flow on color Doppler imaging. Other Findings:  None. IMPRESSION: No evidence of DVT within the left lower extremity. Electronically Signed   By: Richardean Sale M.D.   On: 12/29/2016 20:06    ____________________________________________   PROCEDURES  Procedure(s) performed: None  Procedures  Critical Care performed: No ____________________________________________   INITIAL IMPRESSION / ASSESSMENT AND PLAN / ED COURSE  Pertinent labs & imaging results that were available during my care of the patient were reviewed by me and considered in my medical decision making (see chart for details).  56 y.o. male with a past medical history of diabetes presenting with left lower extremity pain for several weeks, now with swelling. On my examination, I am most concerned about DVT. There is no evidence of septic arthritis as the patient does have full range of motion with some mild discomfort, no overlying skin abnormalities. I do not see evidence of cellulitis.the patient may also have dependent edema, and may benefit from compression hose if his ultrasound is negative. The patient has not had any trauma, and upon reviewing his medical chart, he had a knee x-ray which did not show any acute abnormalities on 11/19/16. His renal function today is normal this is not the cause of his lower extremity edema.  The patient has no history of CHF, nor any shortness of breath or orthopnea, so this is also unlikely to be the cause of his symptoms.  There is no evidence of arterial vascular compromise. We will treat this hyperglycemia with insulin and intravenous fluids, and get an ultrasound. Plan reevaluation for final disposition  ----------------------------------------- 10:16 PM on 12/29/2016 -----------------------------------------  The patient's workup in the emergency department has been reassuring. He does have hyperglycemia without DKA and resulting hyponatremia, which has been treated with fluids and insulin. His repeat blood sugar shows improvement to 223. He has been counseled to restart his medications and have his primary care physician reevaluate him for these abnormalities.  The patient's ultrasound does not show any evidence of DVT. I have discussed elevation and compression with this patient for treatment of his swelling. I do not see any evidence of cellulitis so antibiotics are not indicated at this time.  At this time, the patient is safe for discharge from the emergency department. Return precautions as well as follow-up instructions were  discussed. ____________________________________________  FINAL CLINICAL IMPRESSION(S) / ED DIAGNOSES  Final diagnoses:  Hyperglycemia  Hyponatremia  Localized swelling, mass, or lump of left lower extremity         NEW MEDICATIONS STARTED DURING THIS VISIT:  New Prescriptions   No medications on file      Eula Listen, MD 12/29/16 2217

## 2017-01-14 ENCOUNTER — Emergency Department: Payer: Medicaid Other

## 2017-01-14 ENCOUNTER — Encounter: Payer: Self-pay | Admitting: Emergency Medicine

## 2017-01-14 ENCOUNTER — Emergency Department
Admission: EM | Admit: 2017-01-14 | Discharge: 2017-01-14 | Disposition: A | Discharge status: Home / Self Care | Payer: Medicaid Other | Attending: Emergency Medicine | Admitting: Emergency Medicine

## 2017-01-14 DIAGNOSIS — Z79899 Other long term (current) drug therapy: Secondary | ICD-10-CM | POA: Insufficient documentation

## 2017-01-14 DIAGNOSIS — E119 Type 2 diabetes mellitus without complications: Secondary | ICD-10-CM | POA: Insufficient documentation

## 2017-01-14 DIAGNOSIS — I10 Essential (primary) hypertension: Secondary | ICD-10-CM | POA: Diagnosis not present

## 2017-01-14 DIAGNOSIS — M79605 Pain in left leg: Secondary | ICD-10-CM | POA: Diagnosis present

## 2017-01-14 DIAGNOSIS — Z96651 Presence of right artificial knee joint: Secondary | ICD-10-CM | POA: Insufficient documentation

## 2017-01-14 DIAGNOSIS — F17210 Nicotine dependence, cigarettes, uncomplicated: Secondary | ICD-10-CM | POA: Insufficient documentation

## 2017-01-14 DIAGNOSIS — M7918 Myalgia, other site: Secondary | ICD-10-CM | POA: Diagnosis not present

## 2017-01-14 DIAGNOSIS — Z794 Long term (current) use of insulin: Secondary | ICD-10-CM | POA: Insufficient documentation

## 2017-01-14 MED ORDER — KETOROLAC TROMETHAMINE 60 MG/2ML IM SOLN
INTRAMUSCULAR | Status: AC
  Filled 2017-01-14: qty 2

## 2017-01-14 MED ORDER — TRAMADOL HCL 50 MG PO TABS
50.0000 mg | ORAL_TABLET | Freq: Once | ORAL | Status: AC
  Administered 2017-01-14: 50 mg via ORAL
  Filled 2017-01-14: qty 1

## 2017-01-14 MED ORDER — TRAMADOL HCL 50 MG PO TABS: 50.0000 mg | ORAL_TABLET | Freq: Four times a day (QID) | ORAL | 0 refills | Status: DC | PRN

## 2017-01-14 MED ORDER — KETOROLAC TROMETHAMINE 60 MG/2ML IM SOLN
60.0000 mg | Freq: Once | INTRAMUSCULAR | Status: AC
  Administered 2017-01-14: 60 mg via INTRAMUSCULAR

## 2017-01-14 NOTE — ED Triage Notes (Signed)
Pt states left leg pain for over one month. Pt states pain is worse with movement, weight bearing. Pt has history of neuropathy and "leg surgery with pins" to left leg. Pt was seen by orthopedist who prescribed "antidepressants" for leg pain.
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
## 2017-01-14 NOTE — Discharge Instructions (Signed)
Please follow up with orthopedic surgery for further evaluation of your leg pain. Please continue to take your medication at home.

## 2017-01-14 NOTE — ED Notes (Signed)
Pt updated on ultrasound process. Pt verbalizes understanding.  

## 2017-01-14 NOTE — ED Notes (Signed)
Report to felicia, rn.  

## 2017-01-14 NOTE — ED Notes (Signed)
Pt able to transfer to wheelchair. Understands importance of following up with ortho. Discharge information reviewed, e-signature pad not working, pt verbalized understanding of discharge information.

## 2017-01-16 DIAGNOSIS — L03119 Cellulitis of unspecified part of limb: Secondary | ICD-10-CM | POA: Insufficient documentation

## 2017-01-16 DIAGNOSIS — M79605 Pain in left leg: Secondary | ICD-10-CM | POA: Insufficient documentation

## 2017-02-12 ENCOUNTER — Telehealth: Payer: Self-pay

## 2017-02-12 NOTE — Telephone Encounter (Signed)
Patient advised to purchase an ankle brace from local retailer such as [employer] or pharmacy.

## 2017-03-08 ENCOUNTER — Ambulatory Visit
Payer: Medicaid Other | Attending: Student in an Organized Health Care Education/Training Program | Admitting: Student in an Organized Health Care Education/Training Program

## 2017-03-29 ENCOUNTER — Other Ambulatory Visit: Payer: Self-pay

## 2017-03-29 ENCOUNTER — Ambulatory Visit
Payer: Medicaid Other | Attending: Student in an Organized Health Care Education/Training Program | Admitting: Student in an Organized Health Care Education/Training Program

## 2017-03-29 ENCOUNTER — Encounter: Payer: Self-pay | Admitting: Student in an Organized Health Care Education/Training Program

## 2017-03-29 VITALS — BP 144/79 | HR 93 | Temp 98.0°F | Resp 18 | Ht 67.0 in | Wt 215.0 lb

## 2017-03-29 DIAGNOSIS — M79671 Pain in right foot: Secondary | ICD-10-CM | POA: Insufficient documentation

## 2017-03-29 DIAGNOSIS — F17210 Nicotine dependence, cigarettes, uncomplicated: Secondary | ICD-10-CM | POA: Insufficient documentation

## 2017-03-29 DIAGNOSIS — G894 Chronic pain syndrome: Secondary | ICD-10-CM | POA: Diagnosis not present

## 2017-03-29 DIAGNOSIS — Z7982 Long term (current) use of aspirin: Secondary | ICD-10-CM | POA: Diagnosis not present

## 2017-03-29 DIAGNOSIS — G8929 Other chronic pain: Secondary | ICD-10-CM

## 2017-03-29 DIAGNOSIS — Z9889 Other specified postprocedural states: Secondary | ICD-10-CM | POA: Diagnosis not present

## 2017-03-29 DIAGNOSIS — Z79899 Other long term (current) drug therapy: Secondary | ICD-10-CM | POA: Insufficient documentation

## 2017-03-29 DIAGNOSIS — Z96651 Presence of right artificial knee joint: Secondary | ICD-10-CM | POA: Insufficient documentation

## 2017-03-29 DIAGNOSIS — M25562 Pain in left knee: Secondary | ICD-10-CM | POA: Diagnosis not present

## 2017-03-29 DIAGNOSIS — M25561 Pain in right knee: Secondary | ICD-10-CM

## 2017-03-29 DIAGNOSIS — M79672 Pain in left foot: Secondary | ICD-10-CM | POA: Insufficient documentation

## 2017-03-29 DIAGNOSIS — M19072 Primary osteoarthritis, left ankle and foot: Secondary | ICD-10-CM | POA: Diagnosis not present

## 2017-03-29 MED ORDER — DICLOFENAC SODIUM 75 MG PO TBEC: 75.0000 mg | DELAYED_RELEASE_TABLET | Freq: Two times a day (BID) | ORAL | 1 refills | Status: DC

## 2017-03-29 NOTE — Progress Notes (Signed)
Safety precautions to be maintained throughout the outpatient stay will include: orient to surroundings, keep bed in low position, maintain call bell within reach at all times, provide assistance with transfer out of bed and ambulation.  

## 2017-03-29 NOTE — Patient Instructions (Addendum)
1. Diclofenac 75 mg twice daily 2. Can discuss genicular nerves block at next visit if Diclofenac not helpful  GENERAL RISKS AND COMPLICATIONS  What are the risk, side effects and possible complications? Generally speaking, most procedures are safe.  However, with any procedure there are risks, side effects, and the possibility of complications.  The risks and complications are dependent upon the sites that are lesioned, or the type of nerve block to be performed.  The closer the procedure is to the spine, the more serious the risks are.  Great care is taken when placing the radio frequency needles, block needles or lesioning probes, but sometimes complications can occur. 1. Infection: Any time there is an injection through the skin, there is a risk of infection.  This is why sterile conditions are used for these blocks.  There are four possible types of infection. 1. Localized skin infection. 2. Central Nervous System Infection-This can be in the form of Meningitis, which can be deadly. 3. Epidural Infections-This can be in the form of an epidural abscess, which can cause pressure inside of the spine, causing compression of the spinal cord with subsequent paralysis. This would require an emergency surgery to decompress, and there are no guarantees that the patient would recover from the paralysis. 4. Discitis-This is an infection of the intervertebral discs.  It occurs in about 1% of discography procedures.  It is difficult to treat and it may lead to surgery.        2. Pain: the needles have to go through skin and soft tissues, will cause soreness.       3. Damage to internal structures:  The nerves to be lesioned may be near blood vessels or    other nerves which can be potentially damaged.       4. Bleeding: Bleeding is more common if the patient is taking blood thinners such as  aspirin, Coumadin, Ticiid, Plavix, etc., or if he/she have some genetic predisposition  such as hemophilia. Bleeding  into the spinal canal can cause compression of the spinal  cord with subsequent paralysis.  This would require an emergency surgery to  decompress and there are no guarantees that the patient would recover from the  paralysis.       5. Pneumothorax:  Puncturing of a lung is a possibility, every time a needle is introduced in  the area of the chest or upper back.  Pneumothorax refers to free air around the  collapsed lung(s), inside of the thoracic cavity (chest cavity).  Another two possible  complications related to a similar event would include: Hemothorax and Chylothorax.   These are variations of the Pneumothorax, where instead of air around the collapsed  lung(s), you may have blood or chyle, respectively.       6. Spinal headaches: They may occur with any procedures in the area of the spine.       7. Persistent CSF (Cerebro-Spinal Fluid) leakage: This is a rare problem, but may occur  with prolonged intrathecal or epidural catheters either due to the formation of a fistulous  track or a dural tear.       8. Nerve damage: By working so close to the spinal cord, there is always a possibility of  nerve damage, which could be as serious as a permanent spinal cord injury with  paralysis.       9. Death:  Although rare, severe deadly allergic reactions known as "Anaphylactic  reaction" can occur to any of  the medications used.      10. Worsening of the symptoms:  We can always make thing worse.  What are the chances of something like this happening? Chances of any of this occuring are extremely low.  By statistics, you have more of a chance of getting killed in a motor vehicle accident: while driving to the hospital than any of the above occurring .  Nevertheless, you should be aware that they are possibilities.  In general, it is similar to taking a shower.  Everybody knows that you can slip, hit your head and get killed.  Does that mean that you should not shower again?  Nevertheless always keep in mind  that statistics do not mean anything if you happen to be on the wrong side of them.  Even if a procedure has a 1 (one) in a 1,000,000 (million) chance of going wrong, it you happen to be that one..Also, keep in mind that by statistics, you have more of a chance of having something go wrong when taking medications.  Who should not have this procedure? If you are on a blood thinning medication (e.g. Coumadin, Plavix, see list of "Blood Thinners"), or if you have an active infection going on, you should not have the procedure.  If you are taking any blood thinners, please inform your physician.  How should I prepare for this procedure?  Do not eat or drink anything at least six hours prior to the procedure.  Bring a driver with you .  It cannot be a taxi.  Come accompanied by an adult that can drive you back, and that is strong enough to help you if your legs get weak or numb from the local anesthetic.  Take all of your medicines the morning of the procedure with just enough water to swallow them.  If you have diabetes, make sure that you are scheduled to have your procedure done first thing in the morning, whenever possible.  If you have diabetes, take only half of your insulin dose and notify our nurse that you have done so as soon as you arrive at the clinic.  If you are diabetic, but only take blood sugar pills (oral hypoglycemic), then do not take them on the morning of your procedure.  You may take them after you have had the procedure.  Do not take aspirin or any aspirin-containing medications, at least eleven (11) days prior to the procedure.  They may prolong bleeding.  Wear loose fitting clothing that may be easy to take off and that you would not mind if it got stained with Betadine or blood.  Do not wear any jewelry or perfume  Remove any nail coloring.  It will interfere with some of our monitoring equipment.  NOTE: Remember that this is not meant to be interpreted as a  complete list of all possible complications.  Unforeseen problems may occur.  BLOOD THINNERS The following drugs contain aspirin or other products, which can cause increased bleeding during surgery and should not be taken for 2 weeks prior to and 1 week after surgery.  If you should need take something for relief of minor pain, you may take acetaminophen which is found in Tylenol,m Datril, Anacin-3 and Panadol. It is not blood thinner. The products listed below are.  Do not take any of the products listed below in addition to any listed on your instruction sheet.  A.P.C or A.P.C with Codeine Codeine Phosphate Capsules #3 Ibuprofen Ridaura  ABC compound Congesprin  Imuran rimadil  Advil Cope Indocin Robaxisal  Alka-Seltzer Effervescent Pain Reliever and Antacid Coricidin or Coricidin-D  Indomethacin Rufen  Alka-Seltzer plus Cold Medicine Cosprin Ketoprofen S-A-C Tablets  Anacin Analgesic Tablets or Capsules Coumadin Korlgesic Salflex  Anacin Extra Strength Analgesic tablets or capsules CP-2 Tablets Lanoril Salicylate  Anaprox Cuprimine Capsules Levenox Salocol  Anexsia-D Dalteparin Magan Salsalate  Anodynos Darvon compound Magnesium Salicylate Sine-off  Ansaid Dasin Capsules Magsal Sodium Salicylate  Anturane Depen Capsules Marnal Soma  APF Arthritis pain formula Dewitt's Pills Measurin Stanback  Argesic Dia-Gesic Meclofenamic Sulfinpyrazone  Arthritis Bayer Timed Release Aspirin Diclofenac Meclomen Sulindac  Arthritis pain formula Anacin Dicumarol Medipren Supac  Analgesic (Safety coated) Arthralgen Diffunasal Mefanamic Suprofen  Arthritis Strength Bufferin Dihydrocodeine Mepro Compound Suprol  Arthropan liquid Dopirydamole Methcarbomol with Aspirin Synalgos  ASA tablets/Enseals Disalcid Micrainin Tagament  Ascriptin Doan's Midol Talwin  Ascriptin A/D Dolene Mobidin Tanderil  Ascriptin Extra Strength Dolobid Moblgesic Ticlid  Ascriptin with Codeine Doloprin or Doloprin with Codeine  Momentum Tolectin  Asperbuf Duoprin Mono-gesic Trendar  Aspergum Duradyne Motrin or Motrin IB Triminicin  Aspirin plain, buffered or enteric coated Durasal Myochrisine Trigesic  Aspirin Suppositories Easprin Nalfon Trillsate  Aspirin with Codeine Ecotrin Regular or Extra Strength Naprosyn Uracel  Atromid-S Efficin Naproxen Ursinus  Auranofin Capsules Elmiron Neocylate Vanquish  Axotal Emagrin Norgesic Verin  Azathioprine Empirin or Empirin with Codeine Normiflo Vitamin E  Azolid Emprazil Nuprin Voltaren  Bayer Aspirin plain, buffered or children's or timed BC Tablets or powders Encaprin Orgaran Warfarin Sodium  Buff-a-Comp Enoxaparin Orudis Zorpin  Buff-a-Comp with Codeine Equegesic Os-Cal-Gesic   Buffaprin Excedrin plain, buffered or Extra Strength Oxalid   Bufferin Arthritis Strength Feldene Oxphenbutazone   Bufferin plain or Extra Strength Feldene Capsules Oxycodone with Aspirin   Bufferin with Codeine Fenoprofen Fenoprofen Pabalate or Pabalate-SF   Buffets II Flogesic Panagesic   Buffinol plain or Extra Strength Florinal or Florinal with Codeine Panwarfarin   Buf-Tabs Flurbiprofen Penicillamine   Butalbital Compound Four-way cold tablets Penicillin   Butazolidin Fragmin Pepto-Bismol   Carbenicillin Geminisyn Percodan   Carna Arthritis Reliever Geopen Persantine   Carprofen Gold's salt Persistin   Chloramphenicol Goody's Phenylbutazone   Chloromycetin Haltrain Piroxlcam   Clmetidine heparin Plaquenil   Cllnoril Hyco-pap Ponstel   Clofibrate Hydroxy chloroquine Propoxyphen         Before stopping any of these medications, be sure to consult the physician who ordered them.  Some, such as Coumadin (Warfarin) are ordered to prevent or treat serious conditions such as "deep thrombosis", "pumonary embolisms", and other heart problems.  The amount of time that you may need off of the medication may also vary with the medication and the reason for which you were taking it.  If you are  taking any of these medications, please make sure you notify your pain physician before you undergo any procedures.   ____________________________________________________________________________________________  Genicular Nerve Block  What is a genicular nerve block? A genicular nerve block is the injection of a local anesthetic to block the nerves that transmits pain from the knee.  What is the purpose of a facet nerve block? A genicular nerve block is a diagnostic procedure to determine if the pathologic changes (i.e. arthritis, meniscal tears, etc) and inflammation within the knee joint is the source of your knee pain. It also confirms that the knee pain will respond well to the actual treatment procedure. If a genicular nerve block works, it will give you relief for several hours. After that, the pain is  expected to return to normal. This test is always performed twice (usually a week or two apart) because two successful tests are required to move onto treatment. If both diagnostic tests are positive, then we schedule a treatment called radiofrequency (RF) ablation. In this procedure, the same nerves are cauterized, which typically leads to pain relief for 4 -18 months. If this process works well for one knee, it can be performed on the other knee if needed.  How is the procedure performed? You will be placed on the procedure table. The injection site is sterilized with either iodine or chlorhexadine. The site to be injected is numbed with a local anesthetic, and a needle is directed to the target area. X-ray guidance is used to ensure proper placement and positioning of the needle. When the needle is properly positioned near the genicular nerve, local anesthetic is injected to numb that nerve. This will be repeated at multiple sites around the knee to block all genicular nerves.  Will the procedure be painful? The injection can be painful and we therefore provide the option of receiving IV  sedation. IV sedation, combined with local anesthetic, can make the injection nearly pain free. It allows you to remain very still during the procedure, which can also make the injection easier, faster, and more successful. If you decide to have IV sedation, you must have a driver to get you home safely afterwards. In addition, you cannot have anything to eat or drink within 8 hours of your appointment (clear liquids are allowed until 3 hours before the procedure). If you take medications for diabetes, these medications may need to be adjusted the morning of the procedure. Your primary care physician can help you with this adjustment.  What are the discharge instructions? If you received IV sedation do not drive or operate machinery for at least 24 hours after the procedure. You may return to work the next day following your procedure. You may resume your normal diet immediately. Do not engage in any strenuous activity for 24 hours. You should, however, engage in moderate activity that typically causes your ususal pain. If the block works, those activities should not be painful for several hours after the injection. Do not take a bath, swim, or use a hot tub for 24 hours (you may take a shower). Call the office if you have any of the following: severe pain afterwards (different than your usual symptoms), redness/swelling/discharge at the injection site(s), fevers/chills, difficulty with bowel or bladder functions.  What are the risks and side effects? The complication rate for this procedure is very low. Whenever a needle enters the skin, bleeding or infection can occur. Some other serious but extremely rare risks include paralysis and death. You may have an allergic reaction to any of the medications used. If you have a known allergy to any medications, especially local anesthetics, notify our staff before the procedure takes place. You may experience any of the following side effects up to 4 - 6 hours  after the procedure: . Leg muscle weakness or numbness may occur due to the local anesthetic affecting the nerves that control your legs (this is a temporary affect and it is not paralysis). If you have any leg weakness or numbness, walk only with assistance in order to prevent falls and injury. Your leg strength will return slowly and completely. . Dizziness may occur due to a decrease in your blood pressure. If this occurs, remain in a seated or lying position. Gradually sit up, and  then stand after at least 10 minutes of sitting. . Mild headaches may occur. Drink fluids and take pain medications if needed. If the headaches persist or become severe, call the office. . Mild discomfort at the injection site can occur. This typically lasts for a few hours but can persist for a couple days. If this occurs, take anti-inflammatories or pain medications, apply ice to the area the day of the procedure. If it persists, apply moist heat in the day(s) following.  The side effects listed above can be normal. They are not dangerous and will resolve on their own. If, however, you experience any of the following, a complication may have occurred and you should either contact your doctor. If he is not readily available, then you should proceed to the closest urgent care center for evaluation: . Severe or progressive pain at the injection site(s) . Arm or leg weakness that progressively worsens or persists for longer than 8 hours . Severe or progressive redness, swelling, or discharge from the injections site(s) . Fevers, chills, nausea, or vomiting . Bowel or bladder dysfunction (i.e. inability to urinate or pass stool or difficulty controlling either)  How long does it take for the procedure to work? You should feel relief from your usual pain within the first hour. Again, this is only expected to last for several hours, at the most. Remember, you may be sore in the middle part of your back from the needles, and  you must distinguish this from your usual pain. ____________________________________________________________________________________________
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
## 2017-04-16 ENCOUNTER — Encounter (INDEPENDENT_AMBULATORY_CARE_PROVIDER_SITE_OTHER): Payer: Self-pay | Admitting: Vascular Surgery

## 2017-04-26 ENCOUNTER — Encounter (INDEPENDENT_AMBULATORY_CARE_PROVIDER_SITE_OTHER): Payer: Self-pay | Admitting: Vascular Surgery

## 2017-05-08 ENCOUNTER — Encounter: Payer: Medicaid Other | Admitting: Student in an Organized Health Care Education/Training Program

## 2017-05-28 ENCOUNTER — Encounter (INDEPENDENT_AMBULATORY_CARE_PROVIDER_SITE_OTHER): Payer: Self-pay | Admitting: Vascular Surgery

## 2017-05-28 ENCOUNTER — Ambulatory Visit (INDEPENDENT_AMBULATORY_CARE_PROVIDER_SITE_OTHER): Payer: Medicaid Other | Admitting: Vascular Surgery

## 2017-05-28 DIAGNOSIS — Z794 Long term (current) use of insulin: Secondary | ICD-10-CM | POA: Diagnosis not present

## 2017-05-28 DIAGNOSIS — M1990 Unspecified osteoarthritis, unspecified site: Secondary | ICD-10-CM | POA: Insufficient documentation

## 2017-05-28 DIAGNOSIS — M159 Polyosteoarthritis, unspecified: Secondary | ICD-10-CM

## 2017-05-28 DIAGNOSIS — E118 Type 2 diabetes mellitus with unspecified complications: Secondary | ICD-10-CM

## 2017-05-28 DIAGNOSIS — I890 Lymphedema, not elsewhere classified: Secondary | ICD-10-CM | POA: Diagnosis not present

## 2017-05-28 DIAGNOSIS — M150 Primary generalized (osteo)arthritis: Secondary | ICD-10-CM

## 2017-05-28 DIAGNOSIS — I10 Essential (primary) hypertension: Secondary | ICD-10-CM | POA: Diagnosis not present

## 2017-05-28 DIAGNOSIS — E1159 Type 2 diabetes mellitus with other circulatory complications: Secondary | ICD-10-CM | POA: Insufficient documentation

## 2017-05-28 NOTE — Progress Notes (Signed)
MRN : [id number]  [name] is a 57 y.o. (1960/12/03) male who presents with chief complaint of  Chief Complaint  Patient presents with  . New Patient (Initial Visit)    Left LLE swelling  .  History of Present Illness: Patient is seen for evaluation of leg pain and leg swelling. The patient first noticed the swelling remotely. The swelling is associated with pain and discoloration. The pain and swelling worsens with prolonged dependency and improves with elevation. The pain is unrelated to activity.  The patient notes that in the morning the legs are significantly improved but they steadily worsened throughout the course of the day. The patient also notes a steady worsening of the discoloration in the ankle and shin area.   The patient denies claudication symptoms.  The patient denies symptoms consistent with rest pain.  The patient denies and extensive history of DJD and LS spine disease.  The patient has no had any past angiography, interventions or vascular surgery.  Elevation makes the leg symptoms better, dependency makes them much worse. There is no history of ulcerations. The patient denies any recent changes in medications.  The patient has not been wearing graduated compression.  The patient denies a history of DVT or PE. There is no prior history of phlebitis. There is no history of primary lymphedema.  No history of malignancies. No history of trauma or groin or pelvic surgery. There is no history of radiation treatment to the groin or pelvis  The patient denies amaurosis fugax or recent TIA symptoms. There are no recent neurological changes noted. The patient denies recent episodes of angina or shortness of breath  Current Meds  Medication Sig  . amitriptyline (ELAVIL) 25 MG tablet Take 50 mg by mouth.  Marland Kitchen amLODipine (NORVASC) 10 MG tablet Take 10 mg by mouth daily.  Marland Kitchen aspirin 81 MG chewable tablet Chew 81 mg by mouth daily.  . colchicine 0.6 MG tablet Take  0.6 mg by mouth.  . diclofenac (VOLTAREN) 75 MG EC tablet Take 1 tablet (75 mg total) by mouth 2 (two) times daily.  . furosemide (LASIX) 40 MG tablet Take 40 mg by mouth daily.  Marland Kitchen glucose blood test strip Use as instructed  . HYDROcodone-acetaminophen (NORCO) 5-325 MG tablet Take 1 tablet by mouth every 6 (six) hours as needed for moderate pain.  Marland Kitchen ibuprofen (ADVIL,MOTRIN) 800 MG tablet Take 1 tablet (800 mg total) by mouth every 8 (eight) hours as needed for moderate pain.  Marland Kitchen insulin glargine (LANTUS) 100 UNIT/ML injection Inject 55 Units into the skin daily.  . Insulin Syringe-Needle U-100 25G X 1" 1 ML MISC Any brand, for 4 times a day insulin SQ, 1 month supply.  . Lancets (ACCU-CHEK MULTICLIX) lancets Use as instructed  . lidocaine (LIDODERM) 5 % Place 1 patch onto the skin every 12 (twelve) hours. Remove & Discard patch within 12 hours or as directed by MD  . lisinopril (PRINIVIL,ZESTRIL) 40 MG tablet Take 40 mg by mouth daily.  . metFORMIN (GLUCOPHAGE) 500 MG tablet Take 1,000 mg by mouth daily with breakfast.  . metFORMIN (GLUCOPHAGE-XR) 500 MG 24 hr tablet Take 1,000 mg by mouth.  . metoprolol succinate (TOPROL-XL) 25 MG 24 hr tablet Take 25 mg by mouth daily.  . predniSONE (STERAPRED UNI-PAK 21 TAB) 10 MG (21) TBPK tablet Take 1 tablet (10 mg total) by mouth daily. Please take 6 pills in the morning for the next 6 days, then taper by one pill every day  until finished, thank you  . traMADol (ULTRAM) 50 MG tablet Take 1 tablet (50 mg total) by mouth every 6 (six) hours as needed.    Past Medical History:  Diagnosis Date  . Diabetes mellitus without complication ([place])   . Gout   . Hyperlipidemia   . Hypertension   . Insomnia     Past Surgical History:  Procedure Laterality Date  . JOINT REPLACEMENT     right knee  . LEG SURGERY      Social History Social History   Tobacco Use  . Smoking status: Current Every Day Smoker    Packs/day: 1.00    Years: 50.00    Pack years:  50.00    Types: Cigarettes  . Smokeless tobacco: Never Used  Substance Use Topics  . Alcohol use: Yes    Alcohol/week: 2.4 oz    Types: 4 Cans of beer per week    Comment: 4 40 oz beer daily  . Drug use: No    Family History Family History  Problem Relation Age of Onset  . Diabetes Mother   No family history of bleeding/clotting disorders, porphyria or autoimmune disease   No Known Allergies   REVIEW OF SYSTEMS (Negative unless checked)  Constitutional: [] Weight loss  [] Fever  [] Chills Cardiac: [] Chest pain   [] Chest pressure   [] Palpitations   [] Shortness of breath when laying flat   [] Shortness of breath with exertion. Vascular:  [] Pain in legs with walking   [x] Pain in legs at rest  [] History of DVT   [] Phlebitis   [x] Swelling in legs   [] Varicose veins   [] Non-healing ulcers Pulmonary:   [] Uses home oxygen   [] Productive cough   [] Hemoptysis   [] Wheeze  [] COPD   [] Asthma Neurologic:  [] Dizziness   [] Seizures   [] History of stroke   [] History of TIA  [] Aphasia   [] Vissual changes   [] Weakness or numbness in arm   [] Weakness or numbness in leg Musculoskeletal:   [] Joint swelling   [] Joint pain   [] Low back pain Hematologic:  [] Easy bruising  [] Easy bleeding   [] Hypercoagulable state   [] Anemic Gastrointestinal:  [] Diarrhea   [] Vomiting  [] Gastroesophageal reflux/heartburn   [] Difficulty swallowing. Genitourinary:  [] Chronic kidney disease   [] Difficult urination  [] Frequent urination   [] Blood in urine Skin:  [] Rashes   [] Ulcers  Psychological:  [] History of anxiety   []  History of major depression.  Physical Examination  Vitals:   05/28/17 1104  BP: 133/76  Pulse: 73  Resp: 18  Weight: 221 lb (100.2 kg)  Height: 5\' 7"  (1.702 m)   Body mass index is 34.61 kg/m. Gen: WD/WN, NAD Head: [place]/AT, No temporalis wasting.  Ear/Nose/Throat: Hearing grossly intact, nares w/o erythema or drainage, poor dentition Eyes: PER, EOMI, sclera nonicteric.  Neck: Supple, no masses.  No  bruit or JVD.  Pulmonary:  Good air movement, clear to auscultation bilaterally, no use of accessory muscles.  Cardiac: RRR, normal S1, S2, no Murmurs. Vascular: scattered varicosities present bilaterally.  moderate venous stasis changes to the legs bilaterally.  4+ soft pitting edema Vessel Right Left  Radial Palpable Palpable  PT Palpable Palpable  DP Palpable Palpable  Gastrointestinal: soft, non-distended. No guarding/no peritoneal signs.  Musculoskeletal: M/S 5/5 throughout.  No deformity or atrophy.  Neurologic: CN 2-12 intact. Pain and light touch intact in extremities.  Symmetrical.  Speech is fluent. Motor exam as listed above. Psychiatric: Judgment intact, Mood & affect appropriate for pt's clinical situation. Dermatologic: No rashes or  ulcers noted.  No changes consistent with cellulitis. Lymph : No Cervical lymphadenopathy, no lichenification or skin changes of chronic lymphedema.  CBC Lab Results  Component Value Date   WBC 6.4 12/29/2016   HGB 10.6 (L) 12/29/2016   HCT 31.4 (L) 12/29/2016   MCV 90.7 12/29/2016   PLT 448 (H) 12/29/2016    BMET    Component Value Date/Time   NA 131 (L) 12/29/2016 1607   NA 134 (L) 08/22/2012 1857   K 4.1 12/29/2016 1607   K 4.5 08/22/2012 1857   CL 97 (L) 12/29/2016 1607   CL 101 08/22/2012 1857   CO2 25 12/29/2016 1607   CO2 23 08/22/2012 1857   GLUCOSE 497 (H) 12/29/2016 1607   GLUCOSE 379 (H) 08/22/2012 1857   BUN 13 12/29/2016 1607   BUN 6 (L) 08/22/2012 1857   CREATININE 0.79 12/29/2016 1607   CREATININE 0.61 08/22/2012 1857   CALCIUM 9.0 12/29/2016 1607   CALCIUM 9.1 08/22/2012 1857   GFRNONAA >60 12/29/2016 1607   GFRNONAA >60 08/22/2012 1857   GFRAA >60 12/29/2016 1607   GFRAA >60 08/22/2012 1857   CrCl cannot be calculated (Patient's most recent lab result is older than the maximum 21 days allowed.).  COAG No results found for: INR, PROTIME  Radiology No results found.   Assessment/Plan 1. Lymphedema No  surgery or intervention at this point in time.    I have had a long discussion with the patient regarding venous insufficiency and why it  causes symptoms. I have discussed with the patient the chronic skin changes that accompany venous insufficiency and the long term sequela such as infection and ulceration.  Patient will begin wearing graduated compression stockings class 1 (20-30 mmHg) or compression wraps on a daily basis a prescription was given. The patient will put the stockings on first thing in the morning and removing them in the evening. The patient is instructed specifically not to sleep in the stockings.    In addition, behavioral modification including several periods of elevation of the lower extremities during the day will be continued. I have demonstrated that proper elevation is a position with the ankles at heart level.  The patient is instructed to begin routine exercise, especially walking on a daily basis  Patient should undergo duplex ultrasound of the venous system to ensure that DVT or reflux is not present.  Following the review of the ultrasound the patient will follow up in 2-3 months to reassess the degree of swelling and the control that graduated compression stockings or compression wraps  is offering.   The patient can be assessed for a Lymph Pump at that time  2. Essential hypertension Continue antihypertensive medications as already ordered, these medications have been reviewed and there are no changes at this time.   3. Type 2 diabetes mellitus with complication, with long-term current use of insulin (HCC) Continue hypoglycemic medications as already ordered, these medications have been reviewed and there are no changes at this time.  Hgb A1C to be monitored as already arranged by primary service   4. Primary osteoarthritis involving multiple joints Continue NSAID medications as already ordered, these medications have been reviewed and there are no changes at  this time.  Continued activity and therapy was stressed.     Hortencia Pilar, MD  05/28/2017 9:16 PM

## 2017-08-30 ENCOUNTER — Ambulatory Visit (INDEPENDENT_AMBULATORY_CARE_PROVIDER_SITE_OTHER): Payer: Medicaid Other | Admitting: Vascular Surgery

## 2017-09-27 ENCOUNTER — Other Ambulatory Visit (HOSPITAL_COMMUNITY): Payer: Self-pay | Admitting: Emergency Medicine

## 2017-09-27 ENCOUNTER — Ambulatory Visit (HOSPITAL_COMMUNITY)
Admission: RE | Admit: 2017-09-27 | Discharge: 2017-09-27 | Disposition: A | Discharge status: Home / Self Care | Payer: Medicaid Other | Source: Ambulatory Visit | Attending: Emergency Medicine | Admitting: Emergency Medicine

## 2017-09-27 DIAGNOSIS — M7989 Other specified soft tissue disorders: Secondary | ICD-10-CM | POA: Insufficient documentation

## 2017-10-02 ENCOUNTER — Emergency Department (HOSPITAL_COMMUNITY): Payer: Medicaid Other

## 2017-10-02 ENCOUNTER — Other Ambulatory Visit: Payer: Self-pay

## 2017-10-02 ENCOUNTER — Encounter: Payer: Medicaid Other | Attending: Physician Assistant | Admitting: Physician Assistant

## 2017-10-02 ENCOUNTER — Encounter (HOSPITAL_COMMUNITY): Payer: Self-pay

## 2017-10-02 ENCOUNTER — Inpatient Hospital Stay (HOSPITAL_COMMUNITY)
Admission: EM | Admit: 2017-10-02 | Discharge: 2017-10-09 | DRG: 474 | Disposition: A | Discharge status: Rehabilitation Facility | Payer: Medicaid Other | Attending: Family Medicine | Admitting: Family Medicine

## 2017-10-02 DIAGNOSIS — T8459XA Infection and inflammatory reaction due to other internal joint prosthesis, initial encounter: Secondary | ICD-10-CM | POA: Diagnosis present

## 2017-10-02 DIAGNOSIS — L97529 Non-pressure chronic ulcer of other part of left foot with unspecified severity: Secondary | ICD-10-CM | POA: Diagnosis not present

## 2017-10-02 DIAGNOSIS — S88111S Complete traumatic amputation at level between knee and ankle, right lower leg, sequela: Secondary | ICD-10-CM | POA: Diagnosis not present

## 2017-10-02 DIAGNOSIS — Z720 Tobacco use: Secondary | ICD-10-CM

## 2017-10-02 DIAGNOSIS — D649 Anemia, unspecified: Secondary | ICD-10-CM | POA: Diagnosis not present

## 2017-10-02 DIAGNOSIS — Z96651 Presence of right artificial knee joint: Secondary | ICD-10-CM | POA: Diagnosis present

## 2017-10-02 DIAGNOSIS — F17210 Nicotine dependence, cigarettes, uncomplicated: Secondary | ICD-10-CM | POA: Diagnosis present

## 2017-10-02 DIAGNOSIS — L02612 Cutaneous abscess of left foot: Secondary | ICD-10-CM | POA: Diagnosis not present

## 2017-10-02 DIAGNOSIS — D638 Anemia in other chronic diseases classified elsewhere: Secondary | ICD-10-CM | POA: Diagnosis present

## 2017-10-02 DIAGNOSIS — I10 Essential (primary) hypertension: Secondary | ICD-10-CM

## 2017-10-02 DIAGNOSIS — D508 Other iron deficiency anemias: Secondary | ICD-10-CM

## 2017-10-02 DIAGNOSIS — M86172 Other acute osteomyelitis, left ankle and foot: Secondary | ICD-10-CM | POA: Diagnosis not present

## 2017-10-02 DIAGNOSIS — E785 Hyperlipidemia, unspecified: Secondary | ICD-10-CM | POA: Diagnosis present

## 2017-10-02 DIAGNOSIS — E11621 Type 2 diabetes mellitus with foot ulcer: Secondary | ICD-10-CM | POA: Insufficient documentation

## 2017-10-02 DIAGNOSIS — S88112A Complete traumatic amputation at level between knee and ankle, left lower leg, initial encounter: Secondary | ICD-10-CM

## 2017-10-02 DIAGNOSIS — M868X8 Other osteomyelitis, other site: Secondary | ICD-10-CM | POA: Diagnosis present

## 2017-10-02 DIAGNOSIS — Z899 Acquired absence of limb, unspecified: Secondary | ICD-10-CM | POA: Diagnosis not present

## 2017-10-02 DIAGNOSIS — L02416 Cutaneous abscess of left lower limb: Secondary | ICD-10-CM | POA: Diagnosis not present

## 2017-10-02 DIAGNOSIS — E1159 Type 2 diabetes mellitus with other circulatory complications: Secondary | ICD-10-CM

## 2017-10-02 DIAGNOSIS — Y838 Other surgical procedures as the cause of abnormal reaction of the patient, or of later complication, without mention of misadventure at the time of the procedure: Secondary | ICD-10-CM | POA: Diagnosis present

## 2017-10-02 DIAGNOSIS — G4700 Insomnia, unspecified: Secondary | ICD-10-CM | POA: Diagnosis present

## 2017-10-02 DIAGNOSIS — L97424 Non-pressure chronic ulcer of left heel and midfoot with necrosis of bone: Secondary | ICD-10-CM | POA: Diagnosis present

## 2017-10-02 DIAGNOSIS — D62 Acute posthemorrhagic anemia: Secondary | ICD-10-CM

## 2017-10-02 DIAGNOSIS — F17200 Nicotine dependence, unspecified, uncomplicated: Secondary | ICD-10-CM

## 2017-10-02 DIAGNOSIS — T148XXA Other injury of unspecified body region, initial encounter: Secondary | ICD-10-CM | POA: Diagnosis not present

## 2017-10-02 DIAGNOSIS — I96 Gangrene, not elsewhere classified: Secondary | ICD-10-CM | POA: Diagnosis not present

## 2017-10-02 DIAGNOSIS — M19172 Post-traumatic osteoarthritis, left ankle and foot: Secondary | ICD-10-CM | POA: Diagnosis present

## 2017-10-02 DIAGNOSIS — Z794 Long term (current) use of insulin: Secondary | ICD-10-CM | POA: Diagnosis not present

## 2017-10-02 DIAGNOSIS — Z833 Family history of diabetes mellitus: Secondary | ICD-10-CM

## 2017-10-02 DIAGNOSIS — M86272 Subacute osteomyelitis, left ankle and foot: Secondary | ICD-10-CM

## 2017-10-02 DIAGNOSIS — Z79899 Other long term (current) drug therapy: Secondary | ICD-10-CM

## 2017-10-02 DIAGNOSIS — E1165 Type 2 diabetes mellitus with hyperglycemia: Secondary | ICD-10-CM | POA: Diagnosis present

## 2017-10-02 DIAGNOSIS — G8918 Other acute postprocedural pain: Secondary | ICD-10-CM

## 2017-10-02 DIAGNOSIS — E871 Hypo-osmolality and hyponatremia: Secondary | ICD-10-CM | POA: Diagnosis present

## 2017-10-02 DIAGNOSIS — K5903 Drug induced constipation: Secondary | ICD-10-CM | POA: Diagnosis not present

## 2017-10-02 DIAGNOSIS — R2681 Unsteadiness on feet: Secondary | ICD-10-CM | POA: Diagnosis not present

## 2017-10-02 DIAGNOSIS — E1142 Type 2 diabetes mellitus with diabetic polyneuropathy: Secondary | ICD-10-CM | POA: Diagnosis not present

## 2017-10-02 DIAGNOSIS — E1169 Type 2 diabetes mellitus with other specified complication: Secondary | ICD-10-CM | POA: Diagnosis present

## 2017-10-02 DIAGNOSIS — E1151 Type 2 diabetes mellitus with diabetic peripheral angiopathy without gangrene: Secondary | ICD-10-CM | POA: Diagnosis present

## 2017-10-02 DIAGNOSIS — Z7982 Long term (current) use of aspirin: Secondary | ICD-10-CM

## 2017-10-02 DIAGNOSIS — L089 Local infection of the skin and subcutaneous tissue, unspecified: Secondary | ICD-10-CM

## 2017-10-02 DIAGNOSIS — Z89512 Acquired absence of left leg below knee: Secondary | ICD-10-CM | POA: Diagnosis not present

## 2017-10-02 DIAGNOSIS — I872 Venous insufficiency (chronic) (peripheral): Secondary | ICD-10-CM | POA: Diagnosis present

## 2017-10-02 DIAGNOSIS — E119 Type 2 diabetes mellitus without complications: Secondary | ICD-10-CM | POA: Diagnosis not present

## 2017-10-02 DIAGNOSIS — E43 Unspecified severe protein-calorie malnutrition: Secondary | ICD-10-CM

## 2017-10-02 DIAGNOSIS — K5900 Constipation, unspecified: Secondary | ICD-10-CM | POA: Diagnosis present

## 2017-10-02 DIAGNOSIS — N179 Acute kidney failure, unspecified: Secondary | ICD-10-CM | POA: Diagnosis present

## 2017-10-02 DIAGNOSIS — I739 Peripheral vascular disease, unspecified: Secondary | ICD-10-CM

## 2017-10-02 DIAGNOSIS — M109 Gout, unspecified: Secondary | ICD-10-CM | POA: Diagnosis present

## 2017-10-02 DIAGNOSIS — Z6835 Body mass index (BMI) 35.0-35.9, adult: Secondary | ICD-10-CM

## 2017-10-02 DIAGNOSIS — D473 Essential (hemorrhagic) thrombocythemia: Secondary | ICD-10-CM | POA: Diagnosis not present

## 2017-10-02 DIAGNOSIS — Z79891 Long term (current) use of opiate analgesic: Secondary | ICD-10-CM

## 2017-10-02 LAB — CBC WITH DIFFERENTIAL/PLATELET
ABS IMMATURE GRANULOCYTES: 0.2 10*3/uL — AB (ref 0.0–0.1)
BASOS ABS: 0.1 10*3/uL (ref 0.0–0.1)
Basophils Relative: 0 %
EOS PCT: 1 %
Eosinophils Absolute: 0.1 10*3/uL (ref 0.0–0.7)
HCT: 23.1 % — ABNORMAL LOW (ref 39.0–52.0)
HEMOGLOBIN: 7.4 g/dL — AB (ref 13.0–17.0)
IMMATURE GRANULOCYTES: 1 %
LYMPHS PCT: 19 %
Lymphs Abs: 3.1 10*3/uL (ref 0.7–4.0)
MCH: 28.2 pg (ref 26.0–34.0)
MCHC: 32 g/dL (ref 30.0–36.0)
MCV: 88.2 fL (ref 78.0–100.0)
Monocytes Absolute: 1.1 10*3/uL — ABNORMAL HIGH (ref 0.1–1.0)
Monocytes Relative: 7 %
NEUTROS ABS: 11.2 10*3/uL — AB (ref 1.7–7.7)
NEUTROS PCT: 72 %
Platelets: 718 10*3/uL — ABNORMAL HIGH (ref 150–400)
RBC: 2.62 MIL/uL — AB (ref 4.22–5.81)
RDW: 14 % (ref 11.5–15.5)
WBC: 15.8 10*3/uL — AB (ref 4.0–10.5)

## 2017-10-02 LAB — BASIC METABOLIC PANEL
ANION GAP: 10 (ref 5–15)
BUN: 7 mg/dL (ref 6–20)
CO2: 26 mmol/L (ref 22–32)
Calcium: 8.8 mg/dL — ABNORMAL LOW (ref 8.9–10.3)
Chloride: 94 mmol/L — ABNORMAL LOW (ref 98–111)
Creatinine, Ser: 0.78 mg/dL (ref 0.61–1.24)
GFR calc non Af Amer: >60 mL/min (ref >60)
Glucose, Bld: 184 mg/dL — ABNORMAL HIGH (ref 70–99)
POTASSIUM: 4.3 mmol/L (ref 3.5–5.1)
SODIUM: 130 mmol/L — AB (ref 135–145)

## 2017-10-02 LAB — IRON AND TIBC
IRON: 35 ug/dL — AB (ref 45–182)
Saturation Ratios: 16 % — ABNORMAL LOW (ref 17.9–39.5)
TIBC: 220 ug/dL — ABNORMAL LOW (ref 250–450)
UIBC: 185 ug/dL

## 2017-10-02 LAB — RETICULOCYTES
RBC.: 2.44 MIL/uL — ABNORMAL LOW (ref 4.22–5.81)
RETIC COUNT ABSOLUTE: 61 10*3/uL (ref 19.0–186.0)
RETIC CT PCT: 2.5 % (ref 0.4–3.1)

## 2017-10-02 LAB — CBG MONITORING, ED: GLUCOSE-CAPILLARY: 211 mg/dL — AB (ref 70–99)

## 2017-10-02 LAB — LACTIC ACID, PLASMA: LACTIC ACID, VENOUS: 1.7 mmol/L (ref 0.5–1.9)

## 2017-10-02 LAB — FOLATE: Folate: 24 ng/mL (ref >5.9)

## 2017-10-02 LAB — HEMOGLOBIN A1C
HEMOGLOBIN A1C: 11.4 % — AB (ref 4.8–5.6)
MEAN PLASMA GLUCOSE: 280.48 mg/dL

## 2017-10-02 LAB — VITAMIN B12: Vitamin B-12: 534 pg/mL (ref 180–914)

## 2017-10-02 LAB — GLUCOSE, CAPILLARY: GLUCOSE-CAPILLARY: 257 mg/dL — AB (ref 70–99)

## 2017-10-02 LAB — FERRITIN: FERRITIN: 936 ng/mL — AB (ref 24–336)

## 2017-10-02 MED ORDER — VANCOMYCIN HCL IN DEXTROSE 1-5 GM/200ML-% IV SOLN
1000.0000 mg | Freq: Two times a day (BID) | INTRAVENOUS | Status: DC
  Administered 2017-10-03: 1000 mg via INTRAVENOUS
  Filled 2017-10-02: qty 200

## 2017-10-02 MED ORDER — SODIUM CHLORIDE 0.9 % IV SOLN
INTRAVENOUS | Status: DC
  Administered 2017-10-02 – 2017-10-05 (×4): via INTRAVENOUS

## 2017-10-02 MED ORDER — METOPROLOL SUCCINATE ER 25 MG PO TB24: 25.0000 mg | ORAL_TABLET | Freq: Every day | ORAL | Status: DC

## 2017-10-02 MED ORDER — LISINOPRIL 40 MG PO TABS: 40.0000 mg | ORAL_TABLET | Freq: Every day | ORAL | Status: DC

## 2017-10-02 MED ORDER — ENOXAPARIN SODIUM 40 MG/0.4ML ~~LOC~~ SOLN: 40.0000 mg | SUBCUTANEOUS | Status: DC

## 2017-10-02 MED ORDER — AMLODIPINE BESYLATE 5 MG PO TABS
10.0000 mg | ORAL_TABLET | Freq: Every day | ORAL | Status: DC
  Administered 2017-10-03 – 2017-10-09 (×7): 10 mg via ORAL
  Filled 2017-10-02 (×7): qty 2

## 2017-10-02 MED ORDER — ASPIRIN EC 81 MG PO TBEC
81.0000 mg | DELAYED_RELEASE_TABLET | Freq: Every day | ORAL | Status: DC
  Administered 2017-10-03 – 2017-10-09 (×7): 81 mg via ORAL
  Filled 2017-10-02 (×7): qty 1

## 2017-10-02 MED ORDER — PIPERACILLIN-TAZOBACTAM 3.375 G IVPB
3.3750 g | Freq: Three times a day (TID) | INTRAVENOUS | Status: DC
  Administered 2017-10-02 – 2017-10-06 (×11): 3.375 g via INTRAVENOUS
  Filled 2017-10-02 (×12): qty 50

## 2017-10-02 MED ORDER — VANCOMYCIN HCL 10 G IV SOLR
2000.0000 mg | Freq: Once | INTRAVENOUS | Status: AC
  Administered 2017-10-02: 2000 mg via INTRAVENOUS
  Filled 2017-10-02: qty 2000

## 2017-10-02 MED ORDER — INSULIN ASPART 100 UNIT/ML ~~LOC~~ SOLN
0.0000 [IU] | Freq: Three times a day (TID) | SUBCUTANEOUS | Status: DC
  Administered 2017-10-03: 5 [IU] via SUBCUTANEOUS
  Administered 2017-10-03: 2 [IU] via SUBCUTANEOUS
  Administered 2017-10-03 – 2017-10-04 (×2): 3 [IU] via SUBCUTANEOUS
  Administered 2017-10-04: 5 [IU] via SUBCUTANEOUS
  Administered 2017-10-04 – 2017-10-05 (×2): 3 [IU] via SUBCUTANEOUS
  Administered 2017-10-05: 5 [IU] via SUBCUTANEOUS

## 2017-10-02 MED ORDER — POLYETHYLENE GLYCOL 3350 17 G PO PACK: 17.0000 g | PACK | Freq: Every day | ORAL | Status: DC | PRN

## 2017-10-02 MED ORDER — KETOROLAC TROMETHAMINE 30 MG/ML IJ SOLN
30.0000 mg | Freq: Four times a day (QID) | INTRAMUSCULAR | Status: DC | PRN
  Administered 2017-10-02 – 2017-10-03 (×2): 30 mg via INTRAVENOUS
  Filled 2017-10-02 (×2): qty 1

## 2017-10-02 MED ORDER — AMLODIPINE BESYLATE 5 MG PO TABS: 10.0000 mg | ORAL_TABLET | Freq: Every day | ORAL | Status: DC

## 2017-10-02 MED ORDER — MELATONIN 3 MG PO TABS
4.5000 mg | ORAL_TABLET | Freq: Every evening | ORAL | Status: DC | PRN
  Administered 2017-10-07: 4.5 mg via ORAL
  Filled 2017-10-02 (×2): qty 1.5

## 2017-10-02 MED ORDER — NON FORMULARY
5.0000 mg | Freq: Every evening | Status: DC | PRN
Start: 2017-10-02 — End: 2017-10-02

## 2017-10-02 MED ORDER — HEPARIN SODIUM (PORCINE) 5000 UNIT/ML IJ SOLN
5000.0000 [IU] | Freq: Three times a day (TID) | INTRAMUSCULAR | Status: DC
  Administered 2017-10-02: 5000 [IU] via SUBCUTANEOUS
  Filled 2017-10-02: qty 1

## 2017-10-02 MED ORDER — LISINOPRIL 40 MG PO TABS
40.0000 mg | ORAL_TABLET | Freq: Every day | ORAL | Status: DC
  Administered 2017-10-03: 40 mg via ORAL
  Filled 2017-10-02: qty 1

## 2017-10-02 MED ORDER — ACETAMINOPHEN 325 MG PO TABS
650.0000 mg | ORAL_TABLET | Freq: Four times a day (QID) | ORAL | Status: DC
Start: 2017-10-03 — End: 2017-10-09
  Administered 2017-10-02 – 2017-10-09 (×25): 650 mg via ORAL
  Filled 2017-10-02 (×24): qty 2

## 2017-10-02 MED ORDER — METOPROLOL SUCCINATE ER 25 MG PO TB24
25.0000 mg | ORAL_TABLET | Freq: Every day | ORAL | Status: DC
  Administered 2017-10-03 – 2017-10-09 (×7): 25 mg via ORAL
  Filled 2017-10-02 (×7): qty 1

## 2017-10-02 MED ORDER — PIPERACILLIN-TAZOBACTAM 3.375 G IVPB 30 MIN
3.3750 g | Freq: Once | INTRAVENOUS | Status: AC
  Administered 2017-10-02: 3.375 g via INTRAVENOUS
  Filled 2017-10-02: qty 50

## 2017-10-02 MED ORDER — ENSURE ENLIVE PO LIQD
237.0000 mL | Freq: Two times a day (BID) | ORAL | Status: DC
  Administered 2017-10-03: 237 mL via ORAL

## 2017-10-02 NOTE — Progress Notes (Addendum)
Pharmacy Antibiotic Note  [name] is a 57 y.o. male admitted on 10/02/2017 with wound infection of his left foot.   Pharmacy has been consulted for Vancomycin dosing for wound infection. He has a h/o surgery on this ankle multiple times in the past.   Zosyn 3.375 g IV x1 given in ED 7/2 at 19:12   Plan: Vancomycin 2000 mg IV x 1 then 1000 mg IV q12h  Monitor clinical status, renal function, cultures, ssVT prn   Height: 5\' 7"  (170.2 cm) Weight: 226 lb (102.5 kg) IBW/kg (Calculated) : 66.1  Temp (24hrs), Avg:99 F (37.2 C), Min:98.4 F (36.9 C), Max:99.5 F (37.5 C)  Recent Labs  Lab 10/02/17 1717 10/02/17 1718  WBC 15.8*  --   CREATININE 0.78  --   LATICACIDVEN  --  1.7    Estimated Creatinine Clearance: 114.9 mL/min (by C-G formula based on SCr of 0.78 mg/dL).    No Known Allergies  Antimicrobials this admission: Zosyn x1 10/02/17 Vanco 7/2>>  Dose adjustments this admission:   Microbiology results: 7/2 BCx: sent  Thank you for allowing pharmacy to be a part of this patient's care.  Nicole Cella, RPh Clinical Pharmacist Please check AMION for all [facility] phone numbers After 10:00 PM, call [facility] [phone] 10/02/2017 9:08 PM   ADDENDUM:  Pharmacy consult received to dose Zosyn.   Plan: Zosyn 3.375 g IV q8h (4h infusion)   Nicole Cella, RPh Clinical Pharmacist 10/02/2017 9:26 PM

## 2017-10-02 NOTE — ED Provider Notes (Signed)
Patient placed in Quick Look pathway, seen and evaluated   Chief Complaint: leg wound infection left lower leg/ankle  HPI:  [name] is a 57 y.o. male who presents to the ED after seeing PCP today for wound to the left ankle that started a few days ago and has quickly gotten worse. Patient reports he was sent to the ED after they evaluated him and sent him here for infection. Patient had injury years ago and has screws in the left ankle. No fever or chills.   ROS: Skin: wound left leg  Physical Exam:  BP 114/72 (BP Location: Right Arm)   Pulse 90   Temp 99.2 F (37.3 C) (Oral)   Resp 18   Ht 5\' 7"  (1.702 m)   Wt 102.5 kg (226 lb)   SpO2 97%   BMI 35.40 kg/m     Gen: No distress  Neuro: Awake and Alert  Skin: open wound to the left medial ankle with purulent drainage. There is swelling and tenderness to the ankle       Initiation of care has begun. The patient has been counseled on the process, plan, and necessity for staying for the completion/evaluation, and the remainder of the medical screening examination    Ashley Murrain, NP 10/02/17 1711    Carmin Muskrat, MD 10/02/17 2350

## 2017-10-02 NOTE — ED Notes (Signed)
Pt provided with turkey sandwich.

## 2017-10-02 NOTE — H&P (Addendum)
[facility] Admission History and Physical Service Pager: [phone]  Patient name: [name] Medical record number: [id number] Date of birth: [birth year] Age: 57 y.o. Gender: male  Primary Care Provider: Vidal Schwalbe, MD Consultants: Orthopedics Code Status: Full  Chief Complaint: infected left foot wound  Assessment and Plan: [name] is a 57 y.o. male presenting with left ankle wound and LE swelling. PMH is significant for T2DM, HTN, PVD, gout, and tobacco use.  Infected L ankle/foot wound: Has had some pain and swelling since ORIF of L ankle one year ago. Pain and swelling worsened since 2 weeks, but denies recent injury. Has been taking diclofenac for ankle pain, but this is not helpful. Left ankle/foot are swollen, erythematous, tender to touch from toes to knee with serosanguinous and mucopurulent secretions from open wound at L medial ankle. There is a concern for osteomyelitis, although ankle X-ray was non-conclusive. One dose of Zosyn given in the ED.  Ortho consulted in ED, possible surgery in AM for debridement. - admit to med-surg, attending [facility] - Labs: WBC 15.8, lactic acid wnl at 1.7; temp on admission 98.47F - Imaging: Xray nonconclusive, MRI ordered - Order blood Cx x 2 - Infection control:  Continue Zosyn, add vancomycin per pharmacy consult - Pain control: tylenol 650mg  PO q6 hrs, toradol 30mg /ml IV Q6H PRN - NPO after midnight - Ortho consult, appreciate recommendations - f/u am CBC - f/u ABI  T2DM: Takes Lantus daily and Novolog BID as well as metformin 2 g daily.  Last blood sugar checked at home was 261 a few days ago. CBG on admit 211 - Insulin sensitive sliding scale; consider longer acting insulin if needed on 7/3 - Labs: continue CBG ACHS for 7/2, then q4 hrs thereafter; f/u HbA1c  Anemia: denies having anemia before, however has been noted in [place]. Denies dizziness, lightheadedness, GI bleed, bright  red blood per rectum, and dark stools. Reports global weakness LEs > UEs. Has never had a colonoscopy. Labs: RBC 2.62 L, Hgb 7.4 L, HCT 23.1 L, Plts 718 H.  MCV is wnl. - Order iron studies, FOBT - Consider GI consult on 7/3 if FOBT is positive - F/U AM CBC - Pt consents to blood transfusion as necessary  HTN: BP on admission 114/72 takes amlodipine 10 mg daily, lisinopril 40 mg daily, and Toprol XL 25 mg daily.  - Continue BP regimen from home  Electrolyte Abnormality: hyponatremia noted on past labs and patient is asymptomatic. Labs on admit Na 130 L, Cl 94 L, Ca 8.8 L - IV NS 100cc/hour for maintenance - f/u am CMP  Tobacco use: smokes 1 ppd, has been smoking for 50 years. Declines nicotine patch. - Nicotine patch per patient request  Constipation: denies BM since 2 days. Physical exam significant for soft right sided abdomen and firm left-sided abdomen. - Miralax PRN constipation  Hx alcohol use: Chart indicates a history of four alcoholic drinks per week, patient says he has not consumed alcohol in 3-4 months. - will start CIWA, can discontinue during admission if scores are low  FEN/GI: Carb modified, NPO at midnight, maintenance fluids Prophylaxis: DVT Heparin 5,000 Units SubQ q8 hrs  Disposition: admit to med-surg  History of Present Illness:  [name] is a 57 y.o. male presenting with a left foot wound.  He has had a wound on his medial left foot for the past year after he had ORIF of the L ankle, but it has worsened over the past two weeks.  He went to see his doctor today who advised that he come to the ED.  He cannot identify an inciting event two weeks ago, and just says that it "feels full and tender."  It ruptured on 6/30 after feeling full for a while.  He rates the intensity of his pain as 10/10.  He says that diclofenac does not help his pain.  Other symptoms include.  He denies subjective fevers but says he has lost some appetite and feels weaker than  normal.  In the ED, a pulse was found with doppler but could not be palpated.  He was started on Zosyn for his infection.  Ortho was consulted and recommended ABI and MRI.   Review Of Systems: Per HPI with the following additions:  Review of Systems  Constitutional: Negative for chills, fever and malaise/fatigue.  Eyes: Negative for blurred vision.  Respiratory: Positive for cough and sputum production. Negative for shortness of breath.        Productive phlegm  Cardiovascular: Positive for leg swelling. Negative for chest pain and palpitations.  Gastrointestinal: Negative for blood in stool, constipation, diarrhea and melena.  Musculoskeletal: Negative for back pain and falls.  Neurological: Negative for dizziness and headaches.    Patient Active Problem List   Diagnosis Date Noted  . Infected wound 10/02/2017  . Lymphedema 05/28/2017  . Essential hypertension 05/28/2017  . Diabetes ([place]) 05/28/2017  . DJD (degenerative joint disease) 05/28/2017  . Hyponatremia 04/05/2016  . Anemia 04/05/2016  . Gout 04/05/2016  . Bilateral foot pain 04/04/2016    Past Medical History: Past Medical History:  Diagnosis Date  . Diabetes mellitus without complication ([place])   . Gout   . Hyperlipidemia   . Hypertension   . Insomnia     Past Surgical History: Past Surgical History:  Procedure Laterality Date  . JOINT REPLACEMENT     right knee  . LEG SURGERY      Social History: Social History   Tobacco Use  . Smoking status: Current Every Day Smoker    Packs/day: 1.00    Years: 50.00    Pack years: 50.00    Types: Cigarettes  . Smokeless tobacco: Never Used  Substance Use Topics  . Alcohol use: Yes    Alcohol/week: 2.4 oz    Types: 4 Cans of beer per week    Comment: none in 3 weeks - 10-02-17  . Drug use: No   Additional social history:  Please also refer to relevant sections of EMR.  Family History: Family History  Problem Relation Age of Onset  . Diabetes Mother       Allergies and Medications: No Known Allergies No current facility-administered medications on file prior to encounter.    Current Outpatient Medications on File Prior to Encounter  Medication Sig Dispense Refill  . amitriptyline (ELAVIL) 25 MG tablet Take 50 mg by mouth.    Marland Kitchen amLODipine (NORVASC) 10 MG tablet Take 10 mg by mouth daily.    Marland Kitchen aspirin 81 MG chewable tablet Chew 81 mg by mouth daily.    . colchicine 0.6 MG tablet Take 0.6 mg by mouth.    . diclofenac (VOLTAREN) 75 MG EC tablet Take 1 tablet (75 mg total) by mouth 2 (two) times daily. 60 tablet 1  . etodolac (LODINE) 200 MG capsule Take 1 capsule (200 mg total) by mouth every 8 (eight) hours. (Patient not taking: Reported on 05/28/2017) 12 capsule 0  . furosemide (LASIX) 40 MG tablet Take 40 mg  by mouth daily.    Marland Kitchen gabapentin (NEURONTIN) 100 MG capsule Take 1 capsule (100 mg total) by mouth 3 (three) times daily. (Patient not taking: Reported on 05/28/2017) 90 capsule 5  . glucose blood test strip Use as instructed 100 each 12  . HYDROcodone-acetaminophen (NORCO) 5-325 MG tablet Take 1 tablet by mouth every 6 (six) hours as needed for moderate pain. 15 tablet 0  . ibuprofen (ADVIL,MOTRIN) 800 MG tablet Take 1 tablet (800 mg total) by mouth every 8 (eight) hours as needed for moderate pain. 15 tablet 0  . insulin aspart (NOVOLOG) 100 UNIT/ML injection Inject 5 Units into the skin 3 (three) times daily with meals. Please also use Novolog insulin  as sliding scale according to the chart you are given, thank you (Patient not taking: Reported on 05/28/2017) 20 mL 11  . insulin glargine (LANTUS) 100 UNIT/ML injection Inject 55 Units into the skin daily.    . Insulin Syringe-Needle U-100 25G X 1" 1 ML MISC Any brand, for 4 times a day insulin SQ, 1 month supply. 30 each 0  . Lancets (ACCU-CHEK MULTICLIX) lancets Use as instructed 100 each 12  . lidocaine (LIDODERM) 5 % Place 1 patch onto the skin every 12 (twelve) hours. Remove &  Discard patch within 12 hours or as directed by MD 10 patch 0  . lisinopril (PRINIVIL,ZESTRIL) 40 MG tablet Take 40 mg by mouth daily.    . metFORMIN (GLUCOPHAGE) 500 MG tablet Take 1,000 mg by mouth daily with breakfast.    . metFORMIN (GLUCOPHAGE-XR) 500 MG 24 hr tablet Take 1,000 mg by mouth.    . metoprolol succinate (TOPROL-XL) 25 MG 24 hr tablet Take 25 mg by mouth daily.    . predniSONE (STERAPRED UNI-PAK 21 TAB) 10 MG (21) TBPK tablet Take 1 tablet (10 mg total) by mouth daily. Please take 6 pills in the morning for the next 6 days, then taper by one pill every day until finished, thank you 51 tablet 0  . traMADol (ULTRAM) 50 MG tablet Take 1 tablet (50 mg total) by mouth every 6 (six) hours as needed. 12 tablet 0    Objective: BP 125/73   Pulse 82   Temp 99.5 F (37.5 C) (Rectal)   Resp (!) 29   Ht 5\' 7"  (1.702 m)   Wt 226 lb (102.5 kg)   SpO2 100%   BMI 35.40 kg/m   Physical Exam  Constitutional: He appears well-developed.  HENT:  Head: Normocephalic and atraumatic.  Eyes: EOM are normal. Scleral icterus (minimal) is present.  Neck: Normal range of motion. Neck supple. No JVD present.  Cardiovascular: Normal rate, regular rhythm and normal heart sounds. Exam reveals no gallop and no friction rub.  No murmur heard. Pulses:      Dorsalis pedis pulses are 1+ on the right side, and 0 on the left side.       Posterior tibial pulses are 1+ on the right side, and 0 on the left side.  Pulmonary/Chest: Effort normal and breath sounds normal. He has no wheezes. He has no rales.  Abdominal: Soft. Bowel sounds are normal. He exhibits distension (right side > left due to constipation). There is no tenderness. There is no guarding.  Musculoskeletal: He exhibits edema, tenderness and deformity.       Right foot: There is normal range of motion and no deformity.       Left foot: There is decreased range of motion (secondary to swelling, pain) and deformity (  2cm x 4cm open wood medial  ankle, swelling).  Feet:  Right Foot:  Protective Sensation: 2 sites tested. 2 sites sensed.  Skin Integrity: Negative for ulcer, blister, skin breakdown, erythema or warmth.  Left Foot:  Protective Sensation: 2 sites tested. 2 sites sensed.  Skin Integrity: Positive for skin breakdown, erythema and warmth. Negative for ulcer or blister.  Neurological: He is alert.  Skin: Skin is warm and dry. Capillary refill takes less than 2 seconds. There is erythema.  Psychiatric: His behavior is normal. Thought content normal.     Labs and Imaging: CBC BMET  Recent Labs  Lab 10/02/17 1717  WBC 15.8*  HGB 7.4*  HCT 23.1*  PLT 718*   Recent Labs  Lab 10/02/17 1717  NA 130*  K 4.3  CL 94*  CO2 26  BUN 7  CREATININE 0.78  GLUCOSE 184*  CALCIUM 8.8*     Dg Ankle Complete Left  Result Date: 10/02/2017 CLINICAL DATA:  Swelling) none drainage from LEFT ankle, abscess, history hardware EXAM: LEFT ANKLE COMPLETE - 3+ VIEW COMPARISON:  01/21/2015 FINDINGS: Diffuse osseous demineralization. Single screw at distal tibia. Advanced degenerative changes of the ankle joint with irregular joint space narrowing and significant marginal spur formation. Posttraumatic deformities of the distal tibia and fibula. Plantar calcaneal spur. Chronic sclerosis at the calcaneus. No acute fracture, dislocation or bone destruction. Scattered soft tissue swelling. IMPRESSION: Extensive posttraumatic and degenerative changes of the LEFT ankle as above. No definite acute bony abnormalities. If there is persistent clinical concern for osteomyelitis, recommend MR. Electronically Signed   By: Lavonia Dana M.D.   On: 10/02/2017 18:10   US Venous Img Lower Unilateral Left  Result Date: 09/27/2017 CLINICAL DATA:  Edema x8 months EXAM: LEFT LOWER EXTREMITY VENOUS DOPPLER ULTRASOUND TECHNIQUE: Gray-scale sonography with compression, as well as color and duplex ultrasound, were performed to evaluate the deep venous system from the  level of the common femoral vein through the popliteal and proximal calf veins. COMPARISON:  01/14/2017 FINDINGS: Normal compressibility of the common femoral, superficial femoral, and popliteal veins, as well as the proximal calf veins. No filling defects to suggest DVT on grayscale or color Doppler imaging. Doppler waveforms show normal direction of venous flow, normal respiratory phasicity and response to augmentation. Prominent inguinal lymph nodes. Subcutaneous edema in the lower leg. Survey views of the contralateral common femoral vein are unremarkable. IMPRESSION: No evidence of left lower extremity deep vein thrombosis. Electronically Signed   By: Lucrezia Europe M.D.   On: 09/27/2017 12:16   Milus Banister, [place], PGY-1 10/02/2017 9:51 PM   FPTS Upper-Level Resident Addendum  I have independently interviewed and examined the patient. I have discussed the above with the original author and agree with their documentation. My edits for correction/addition/clarification are in blue. Please see also any attending notes.   Kathrene Alu, MD PGY-2, [facility] 10/02/2017 11:14 PM  [place] Service pager: [phone] (text pages welcome through [facility])

## 2017-10-02 NOTE — ED Notes (Signed)
ED Provider at bedside. 

## 2017-10-02 NOTE — ED Triage Notes (Signed)
Pt arrived with son with c/o right ankle wound. Pt was seen by PCP who reports being able to feel ankle hardware in left inner ankle. Pt has swelling and large amounts of purulent drainage in left ankle. Son at bedside reports that abscess came up on ankle on Thursday and started draining on Sunday.

## 2017-10-02 NOTE — ED Provider Notes (Addendum)
[facility] EMERGENCY DEPARTMENT Provider Note   CSN: [id number] Arrival date & time: 10/02/17  1633     History   Chief Complaint Chief Complaint  Patient presents with  . Wound Infection    HPI [name] is a 57 y.o. male.  HPI 57 year old male history of diabetes, peripheral vascular disease, smoker presents today with wound infection of his left foot.  States he has had pain and some skin breakdown here for a year.  He has a history of surgery on this ankle multiple times in the past.  States the wound has gotten worse and become more inflamed with discharge over the last several days.  He has not had fever or chills.  He has had no pain that is moderate and worsens with palpation or attempting to walk. Past Medical History:  Diagnosis Date  . Diabetes mellitus without complication ([place])   . Gout   . Hyperlipidemia   . Hypertension   . Insomnia     Patient Active Problem List   Diagnosis Date Noted  . Lymphedema 05/28/2017  . Essential hypertension 05/28/2017  . Diabetes ([place]) 05/28/2017  . DJD (degenerative joint disease) 05/28/2017  . Hyponatremia 04/05/2016  . Anemia 04/05/2016  . Gout 04/05/2016  . Bilateral foot pain 04/04/2016    Past Surgical History:  Procedure Laterality Date  . JOINT REPLACEMENT     right knee  . LEG SURGERY          Home Medications    Prior to Admission medications   Medication Sig Start Date End Date Taking? Authorizing Provider  amitriptyline (ELAVIL) 25 MG tablet Take 50 mg by mouth.    [provider]  amLODipine (NORVASC) 10 MG tablet Take 10 mg by mouth daily.    [provider]  aspirin 81 MG chewable tablet Chew 81 mg by mouth daily.    [provider]  colchicine 0.6 MG tablet Take 0.6 mg by mouth.    [provider]  diclofenac (VOLTAREN) 75 MG EC tablet Take 1 tablet (75 mg total) by mouth 2 (two) times daily. 03/29/17   Gillis Santa, MD  etodolac  (LODINE) 200 MG capsule Take 1 capsule (200 mg total) by mouth every 8 (eight) hours. Patient not taking: Reported on 05/28/2017 11/20/16   Loney Hering, MD  furosemide (LASIX) 40 MG tablet Take 40 mg by mouth daily.    [provider]  gabapentin (NEURONTIN) 100 MG capsule Take 1 capsule (100 mg total) by mouth 3 (three) times daily. Patient not taking: Reported on 05/28/2017 04/05/16   Theodoro Grist, MD  glucose blood test strip Use as instructed 04/05/16   Theodoro Grist, MD  HYDROcodone-acetaminophen (NORCO) 5-325 MG tablet Take 1 tablet by mouth every 6 (six) hours as needed for moderate pain. 12/15/16   Paulette Blanch, MD  ibuprofen (ADVIL,MOTRIN) 800 MG tablet Take 1 tablet (800 mg total) by mouth every 8 (eight) hours as needed for moderate pain. 12/15/16   Paulette Blanch, MD  insulin aspart (NOVOLOG) 100 UNIT/ML injection Inject 5 Units into the skin 3 (three) times daily with meals. Please also use Novolog insulin  as sliding scale according to the chart you are given, thank you Patient not taking: Reported on 05/28/2017 04/05/16   Theodoro Grist, MD  insulin glargine (LANTUS) 100 UNIT/ML injection Inject 55 Units into the skin daily.    [provider]  Insulin Syringe-Needle U-100 25G X 1" 1 ML MISC Any  brand, for 4 times a day insulin SQ, 1 month supply. 04/05/16   Theodoro Grist, MD  Lancets (ACCU-CHEK MULTICLIX) lancets Use as instructed 04/05/16   Theodoro Grist, MD  lidocaine (LIDODERM) 5 % Place 1 patch onto the skin every 12 (twelve) hours. Remove & Discard patch within 12 hours or as directed by MD 11/20/16 11/20/17  Loney Hering, MD  lisinopril (PRINIVIL,ZESTRIL) 40 MG tablet Take 40 mg by mouth daily.    [provider]  metFORMIN (GLUCOPHAGE) 500 MG tablet Take 1,000 mg by mouth daily with breakfast.    [provider]  metFORMIN (GLUCOPHAGE-XR) 500 MG 24 hr tablet Take 1,000 mg by mouth.    [provider]  metoprolol succinate (TOPROL-XL)  25 MG 24 hr tablet Take 25 mg by mouth daily.    [provider]  predniSONE (STERAPRED UNI-PAK 21 TAB) 10 MG (21) TBPK tablet Take 1 tablet (10 mg total) by mouth daily. Please take 6 pills in the morning for the next 6 days, then taper by one pill every day until finished, thank you 04/05/16   Theodoro Grist, MD  traMADol (ULTRAM) 50 MG tablet Take 1 tablet (50 mg total) by mouth every 6 (six) hours as needed. 01/14/17   Loney Hering, MD    Family History Family History  Problem Relation Age of Onset  . Diabetes Mother     Social History Social History   Tobacco Use  . Smoking status: Current Every Day Smoker    Packs/day: 1.00    Years: 50.00    Pack years: 50.00    Types: Cigarettes  . Smokeless tobacco: Never Used  Substance Use Topics  . Alcohol use: Yes    Alcohol/week: 2.4 oz    Types: 4 Cans of beer per week    Comment: none in 3 weeks - 10-02-17  . Drug use: No     Allergies   Patient has no known allergies.   Review of Systems Review of Systems  All other systems reviewed and are negative.    Physical Exam Updated Vital Signs BP 128/72 (BP Location: Right Arm) Comment: Simultaneous filing. User may not have seen previous data.  Pulse 91   Temp 99.5 F (37.5 C) (Rectal)   Resp 16   Ht 1.702 m (5\' 7" )   Wt 102.5 kg (226 lb)   SpO2 97%   BMI 35.40 kg/m   Physical Exam  Constitutional: He is oriented to person, place, and time. He appears well-developed and well-nourished.  HENT:  Head: Normocephalic and atraumatic.  Right Ear: External ear normal.  Left Ear: External ear normal.  Mouth/Throat: Oropharynx is clear and moist.  Eyes: Pupils are equal, round, and reactive to light. EOM are normal.  Neck: Normal range of motion. Neck supple.  Cardiovascular: Normal rate, regular rhythm and normal heart sounds.  Pulmonary/Chest: Effort normal and breath sounds normal.  Abdominal: Soft. Bowel sounds are normal.  Musculoskeletal:  7 cm  erythematous raised area with discharge medial aspect of left ankle.  Diffuse edema noted in foot and into the lower leg.  Pulses not palpable but toes are pink and warm  Neurological: He is alert and oriented to person, place, and time.  Skin: Skin is warm and dry. Capillary refill takes less than 2 seconds.  Psychiatric: He has a normal mood and affect.  Nursing note and vitals reviewed.      ED Treatments / Results  Labs (all labs ordered are listed, but  only abnormal results are displayed) Labs Reviewed  CBC WITH DIFFERENTIAL/PLATELET - Abnormal; Notable for the following components:      Result Value   WBC 15.8 (*)    RBC 2.62 (*)    Hemoglobin 7.4 (*)    HCT 23.1 (*)    Platelets 718 (*)    Neutro Abs 11.2 (*)    Monocytes Absolute 1.1 (*)    Abs Immature Granulocytes 0.2 (*)    All other components within normal limits  BASIC METABOLIC PANEL - Abnormal; Notable for the following components:   Sodium 130 (*)    Chloride 94 (*)    Glucose, Bld 184 (*)    Calcium 8.8 (*)    All other components within normal limits  CBG MONITORING, ED - Abnormal; Notable for the following components:   Glucose-Capillary 211 (*)    All other components within normal limits  CULTURE, BLOOD (ROUTINE X 2)  CULTURE, BLOOD (ROUTINE X 2)  LACTIC ACID, PLASMA    EKG None  Radiology Dg Ankle Complete Left  Result Date: 10/02/2017 CLINICAL DATA:  Swelling) none drainage from LEFT ankle, abscess, history hardware EXAM: LEFT ANKLE COMPLETE - 3+ VIEW COMPARISON:  01/21/2015 FINDINGS: Diffuse osseous demineralization. Single screw at distal tibia. Advanced degenerative changes of the ankle joint with irregular joint space narrowing and significant marginal spur formation. Posttraumatic deformities of the distal tibia and fibula. Plantar calcaneal spur. Chronic sclerosis at the calcaneus. No acute fracture, dislocation or bone destruction. Scattered soft tissue swelling. IMPRESSION: Extensive  posttraumatic and degenerative changes of the LEFT ankle as above. No definite acute bony abnormalities. If there is persistent clinical concern for osteomyelitis, recommend MR. Electronically Signed   By: Lavonia Dana M.D.   On: 10/02/2017 18:10    Procedures Procedures (including critical care time)  Medications Ordered in ED Medications  piperacillin-tazobactam (ZOSYN) IVPB 3.375 g (has no administration in time range)     Initial Impression / Assessment and Plan / ED Course  I have reviewed the triage vital signs and the nursing notes.  Pertinent labs & imaging results that were available during my care of the patient were reviewed by me and considered in my medical decision making (see chart for details).    Left foot infection with cellulitis- iv antibiotics ordered.  Discussed with Dr. Lorin Mercy on for ortho- advises will see in consult- please add ABI and MRI Plan admission for treatment Patient also with anemia- hgb decreased to 7 from 10 DM with bs at 184 Discussed with Dr. Ouida Sills, on for [facility] and she will see for admission Final Clinical Impressions(s) / ED Diagnoses   Final diagnoses:  Wound infection  Anemia, unspecified type    ED Discharge Orders    None       Pattricia Boss, MD 10/02/17 Darlin Drop    Pattricia Boss, MD 10/02/17 Einar Crow

## 2017-10-03 ENCOUNTER — Inpatient Hospital Stay (HOSPITAL_COMMUNITY): Payer: Medicaid Other

## 2017-10-03 ENCOUNTER — Other Ambulatory Visit (INDEPENDENT_AMBULATORY_CARE_PROVIDER_SITE_OTHER): Payer: Self-pay | Admitting: Orthopedic Surgery

## 2017-10-03 DIAGNOSIS — E1165 Type 2 diabetes mellitus with hyperglycemia: Secondary | ICD-10-CM

## 2017-10-03 DIAGNOSIS — E1142 Type 2 diabetes mellitus with diabetic polyneuropathy: Secondary | ICD-10-CM

## 2017-10-03 DIAGNOSIS — M86272 Subacute osteomyelitis, left ankle and foot: Secondary | ICD-10-CM

## 2017-10-03 DIAGNOSIS — L02612 Cutaneous abscess of left foot: Secondary | ICD-10-CM

## 2017-10-03 DIAGNOSIS — I96 Gangrene, not elsewhere classified: Secondary | ICD-10-CM

## 2017-10-03 DIAGNOSIS — E43 Unspecified severe protein-calorie malnutrition: Secondary | ICD-10-CM

## 2017-10-03 DIAGNOSIS — I739 Peripheral vascular disease, unspecified: Secondary | ICD-10-CM

## 2017-10-03 DIAGNOSIS — M86172 Other acute osteomyelitis, left ankle and foot: Secondary | ICD-10-CM

## 2017-10-03 LAB — GLUCOSE, CAPILLARY
GLUCOSE-CAPILLARY: 200 mg/dL — AB (ref 70–99)
Glucose-Capillary: 254 mg/dL — ABNORMAL HIGH (ref 70–99)
Glucose-Capillary: 221 mg/dL — ABNORMAL HIGH (ref 70–99)
Glucose-Capillary: 176 mg/dL — ABNORMAL HIGH (ref 70–99)

## 2017-10-03 LAB — CBC
HEMATOCRIT: 21.8 % — AB (ref 39.0–52.0)
HEMOGLOBIN: 7 g/dL — AB (ref 13.0–17.0)
MCH: 28.2 pg (ref 26.0–34.0)
MCHC: 32.1 g/dL (ref 30.0–36.0)
MCV: 87.9 fL (ref 78.0–100.0)
Platelets: 662 10*3/uL — ABNORMAL HIGH (ref 150–400)
RBC: 2.48 MIL/uL — AB (ref 4.22–5.81)
RDW: 13.9 % (ref 11.5–15.5)
WBC: 12.9 10*3/uL — ABNORMAL HIGH (ref 4.0–10.5)

## 2017-10-03 LAB — COMPREHENSIVE METABOLIC PANEL
ALT: 30 U/L (ref 0–44)
ANION GAP: 10 (ref 5–15)
AST: 24 U/L (ref 15–41)
Albumin: 2.1 g/dL — ABNORMAL LOW (ref 3.5–5.0)
Alkaline Phosphatase: 111 U/L (ref 38–126)
BUN: 13 mg/dL (ref 6–20)
CHLORIDE: 97 mmol/L — AB (ref 98–111)
CO2: 23 mmol/L (ref 22–32)
CREATININE: 1.26 mg/dL — AB (ref 0.61–1.24)
Calcium: 8 mg/dL — ABNORMAL LOW (ref 8.9–10.3)
GFR calc non Af Amer: >60 mL/min (ref >60)
Glucose, Bld: 182 mg/dL — ABNORMAL HIGH (ref 70–99)
Potassium: 4.1 mmol/L (ref 3.5–5.1)
SODIUM: 130 mmol/L — AB (ref 135–145)
Total Bilirubin: 1.1 mg/dL (ref 0.3–1.2)
Total Protein: 8 g/dL (ref 6.5–8.1)

## 2017-10-03 LAB — SURGICAL PCR SCREEN
MRSA, PCR: NEGATIVE
STAPHYLOCOCCUS AUREUS: POSITIVE — AB

## 2017-10-03 LAB — HEMOGLOBIN AND HEMATOCRIT, BLOOD
HEMATOCRIT: 25 % — AB (ref 39.0–52.0)
Hemoglobin: 8.2 g/dL — ABNORMAL LOW (ref 13.0–17.0)

## 2017-10-03 LAB — HIV ANTIBODY (ROUTINE TESTING W REFLEX): HIV SCREEN 4TH GENERATION: NONREACTIVE

## 2017-10-03 LAB — PREPARE RBC (CROSSMATCH)

## 2017-10-03 LAB — ABO/RH: ABO/RH(D): O POS

## 2017-10-03 MED ORDER — MUPIROCIN 2 % EX OINT
1.0000 "application " | TOPICAL_OINTMENT | Freq: Two times a day (BID) | CUTANEOUS | Status: DC
  Administered 2017-10-03 – 2017-10-05 (×6): 1 via NASAL

## 2017-10-03 MED ORDER — CHLORHEXIDINE GLUCONATE CLOTH 2 % EX PADS
6.0000 | MEDICATED_PAD | Freq: Every day | CUTANEOUS | Status: AC
  Administered 2017-10-03 – 2017-10-07 (×5): 6 via TOPICAL

## 2017-10-03 MED ORDER — POLYETHYLENE GLYCOL 3350 17 G PO PACK
17.0000 g | PACK | Freq: Two times a day (BID) | ORAL | Status: DC
  Administered 2017-10-03 – 2017-10-04 (×3): 17 g via ORAL
  Filled 2017-10-03 (×3): qty 1

## 2017-10-03 MED ORDER — GADOBENATE DIMEGLUMINE 529 MG/ML IV SOLN
15.0000 mL | Freq: Once | INTRAVENOUS | Status: AC | PRN
  Administered 2017-10-03: 15 mL via INTRAVENOUS

## 2017-10-03 MED ORDER — CHLORHEXIDINE GLUCONATE CLOTH 2 % EX PADS
6.0000 | MEDICATED_PAD | Freq: Every day | CUTANEOUS | Status: AC
  Administered 2017-10-03 – 2017-10-06 (×4): 6 via TOPICAL

## 2017-10-03 MED ORDER — JUVEN PO PACK
1.0000 | PACK | Freq: Two times a day (BID) | ORAL | Status: DC
  Administered 2017-10-03 – 2017-10-09 (×12): 1 via ORAL
  Filled 2017-10-03 (×13): qty 1

## 2017-10-03 MED ORDER — OXYCODONE HCL 5 MG PO TABS
5.0000 mg | ORAL_TABLET | Freq: Four times a day (QID) | ORAL | Status: DC | PRN
  Administered 2017-10-03 – 2017-10-04 (×2): 5 mg via ORAL
  Filled 2017-10-03 (×2): qty 1

## 2017-10-03 MED ORDER — VANCOMYCIN HCL IN DEXTROSE 750-5 MG/150ML-% IV SOLN
750.0000 mg | Freq: Two times a day (BID) | INTRAVENOUS | Status: DC
  Administered 2017-10-03 – 2017-10-04 (×3): 750 mg via INTRAVENOUS
  Filled 2017-10-03 (×4): qty 150

## 2017-10-03 MED ORDER — MUPIROCIN 2 % EX OINT
1.0000 "application " | TOPICAL_OINTMENT | Freq: Two times a day (BID) | CUTANEOUS | Status: DC
  Administered 2017-10-03 – 2017-10-07 (×6): 1 via NASAL
  Filled 2017-10-03 (×4): qty 22

## 2017-10-03 MED ORDER — SODIUM CHLORIDE 0.9% IV SOLUTION
Freq: Once | INTRAVENOUS | Status: AC
  Administered 2017-10-03: 14:00:00 via INTRAVENOUS

## 2017-10-03 MED ORDER — PANTOPRAZOLE SODIUM 40 MG PO TBEC
40.0000 mg | DELAYED_RELEASE_TABLET | Freq: Every day | ORAL | Status: DC
  Administered 2017-10-03 – 2017-10-09 (×7): 40 mg via ORAL
  Filled 2017-10-03 (×7): qty 1
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
## 2017-10-03 NOTE — Progress Notes (Signed)
[name], [name] ([id number]) Visit Report for 10/02/2017 Allergy List Details Patient Name: [name] Date of Service: 10/02/2017 2:45 PM Medical Record Number: [id number] Patient Account Number: [id number] Date of Birth/Sex: 30-Nov-1960 (57 y.o. M) Treating [name]: [name] Primary Care [name]: [name] Other Clinician: Referring [name]: [name] Treating Arabella Revelle/Extender: [name] Weeks in Treatment: 0 Allergies Active Allergies No Known Drug Allergies Allergy Notes Electronic Signature(s) Signed: 10/02/2017 4:47:16 PM By: [name], BSN Entered By: [name] on 10/02/2017 14:53:27 [name] ([id number]) -------------------------------------------------------------------------------- Arrival Information Details Patient Name: [name] Date of Service: 10/02/2017 2:45 PM Medical Record Number: [id number] Patient Account Number: [id number] Date of Birth/Sex: 1960-06-17 (56 y.o. M) Treating [name]: [name] Primary Care Lenin Kuhnle: [name] Other Clinician: Referring [name]: [name] Treating Anneth Brunell/Extender: [name] Weeks in Treatment: 0 Visit Information Patient Arrived: Wheel Chair Arrival Time: 14:50 Accompanied By: son Transfer Assistance: Manual Patient Identification Verified: Yes Secondary Verification Process Yes Completed: Patient Has Alerts: Yes Patient Alerts: Patient on Blood Thinner Type II Diabetic Electronic Signature(s) Signed: 10/02/2017 4:47:16 PM By: [name], BSN Entered By: [name] on 10/02/2017 14:51:35 [name] ([id number]) -------------------------------------------------------------------------------- Clinic Level of Care Assessment Details Patient Name: [name] Date of Service: 10/02/2017 2:45 PM Medical Record Number: [id number] Patient Account Number: [id number] Date of Birth/Sex:  10/20/1960 (56 y.o. M) Treating [name]: [name] Primary Care [name]: [name] Other Clinician: Referring [name]: [name] Treating [name]/Extender: [name], [name] Weeks in Treatment: 0 Clinic Level of Care Assessment Items TOOL 2 Quantity Score X - Use when only an EandM is performed on the INITIAL visit 1 0 ASSESSMENTS - Nursing Assessment / Reassessment X - General Physical Exam (combine w/ comprehensive assessment (listed just below) when 1 20 performed on new pt. evals) X- 1 25 Comprehensive Assessment (HX, ROS, Risk Assessments, Wounds Hx, etc.) ASSESSMENTS - Wound and Skin Assessment / Reassessment X - Simple Wound Assessment / Reassessment - one wound 1 5 []  - 0 Complex Wound Assessment / Reassessment - multiple wounds []  - 0 Dermatologic / Skin Assessment (not related to wound area) ASSESSMENTS - Ostomy and/or Continence Assessment and Care []  - Incontinence Assessment and Management 0 []  - 0 Ostomy Care Assessment and Management (repouching, etc.) PROCESS - Coordination of Care X - Simple Patient / Family Education for ongoing care 1 15 []  - 0 Complex (extensive) Patient / Family Education for ongoing care []  - 0 Staff obtains Programmer, systems, Records, Test Results / Process Orders []  - 0 Staff telephones HHA, Nursing Homes / Clarify orders / etc []  - 0 Routine Transfer to another Facility (non-emergent condition) X- 1 10 Routine Hospital Admission (non-emergent condition) X- 1 15 New Admissions / Biomedical engineer / Ordering NPWT, Apligraf, etc. []  - 0 Emergency Hospital Admission (emergent condition) X- 1 10 Simple Discharge Coordination []  - 0 Complex (extensive) Discharge Coordination PROCESS - Special Needs []  - Pediatric / Minor Patient Management 0 []  - 0 Isolation Patient Management [name] ([id number]) []  - 0 Hearing / Language / Visual special needs []  - 0 Assessment of Community assistance (transportation, D/C  planning, etc.) []  - 0 Additional assistance / Altered mentation []  - 0 Support Surface(s) Assessment (bed, cushion, seat, etc.) INTERVENTIONS - Wound Cleansing / Measurement X - Wound Imaging (photographs - any number of wounds) 1 5 []  - 0 Wound Tracing (instead of photographs) X- 1 5 Simple Wound Measurement -  one wound []  - 0 Complex Wound Measurement - multiple wounds X- 1 5 Simple Wound Cleansing - one wound []  - 0 Complex Wound Cleansing - multiple wounds INTERVENTIONS - Wound Dressings X - Small Wound Dressing one or multiple wounds 1 10 []  - 0 Medium Wound Dressing one or multiple wounds []  - 0 Large Wound Dressing one or multiple wounds []  - 0 Application of Medications - injection INTERVENTIONS - Miscellaneous []  - External ear exam 0 []  - 0 Specimen Collection (cultures, biopsies, blood, body fluids, etc.) []  - 0 Specimen(s) / Culture(s) sent or taken to Lab for analysis []  - 0 Patient Transfer (multiple staff / Civil Service fast streamer / Similar devices) []  - 0 Simple Staple / Suture removal (25 or less) []  - 0 Complex Staple / Suture removal (26 or more) []  - 0 Hypo / Hyperglycemic Management (close monitor of Blood Glucose) X- 1 15 Ankle / Brachial Index (ABI) - do not check if billed separately Has the patient been seen at the hospital within the last three years: Yes Total Score: 140 Level Of Care: New/Established - Level 4 Electronic Signature(s) Signed: 10/02/2017 4:10:39 PM By: [name] Entered By: [name] on 10/02/2017 15:51:25 [name] ([id number]) -------------------------------------------------------------------------------- Encounter Discharge Information Details Patient Name: [name] Date of Service: 10/02/2017 2:45 PM Medical Record Number: [id number] Patient Account Number: [id number] Date of Birth/Sex: Nov 11, 1960 (56 y.o. M) Treating [name]: [name] Primary Care [name]: [name] Other  Clinician: Referring [name]: [name] Treating Kathalina Ostermann/Extender: [name] Weeks in Treatment: 0 Encounter Discharge Information Items Discharge Condition: Stable Ambulatory Status: Ambulatory Discharge Destination: Home Transportation: Private Auto Accompanied By: son and family Schedule Follow-up Appointment: Yes Clinical Summary of Care: Electronic Signature(s) Signed: 10/02/2017 4:10:39 PM By: [name] Entered By: [name] on 10/02/2017 15:43:14 Reinhardt, [name] ([id number]) -------------------------------------------------------------------------------- Lower Extremity Assessment Details Patient Name: [name] Date of Service: 10/02/2017 2:45 PM Medical Record Number: [id number] Patient Account Number: [id number] Date of Birth/Sex: 10-27-1960 (56 y.o. M) Treating [name]: [name] Primary Care Lovella Hardie: [name] Other Clinician: Referring Nene Aranas: [name] Treating Odus Clasby/Extender: [name] Weeks in Treatment: 0 Edema Assessment Assessed: [Left: No] [Right: No] [Left: Edema] [Right: :] Calf Left: Right: Point of Measurement: 35 cm From Medial Instep 40 cm cm Ankle Left: Right: Point of Measurement: 13 cm From Medial Instep 28.3 cm cm Vascular Assessment Pulses: Dorsalis Pedis Palpable: [Left:No] Doppler Audible: [Left:Yes] Posterior Tibial Palpable: [Left:No] Doppler Audible: [Left:Yes] Extremity colors, hair growth, and conditions: Extremity Color: [Left:Hyperpigmented] Hair Growth on Extremity: [Left:Yes] Temperature of Extremity: [Left:Warm] Capillary Refill: [Left:< 3 seconds] Blood Pressure: Brachial: [Left:100] Dorsalis Pedis: 90 [Left:Dorsalis Pedis:] Ankle: Posterior Tibial: [Left:Posterior Tibial: 0.90] Toe Nail Assessment Left: Right: Thick: Yes Discolored: Yes Deformed: Yes Improper Length and Hygiene: Yes Electronic Signature(s) Signed: 10/02/2017 4:47:16 PM By: [name], BSN, [name], CWS,  [name], BSN Entered By: [name] on 10/02/2017 15:20:50 Southall, [name] ([id number]) Toor, [name] ([id number]) -------------------------------------------------------------------------------- Multi Wound Chart Details Patient Name: [name] Date of Service: 10/02/2017 2:45 PM Medical Record Number: [id number] Patient Account Number: [id number] Date of Birth/Sex: [birth year] (56 y.o. M) Treating [name]: [name] Primary Care Dyasia Firestine: [name] Other Clinician: Referring Blake Goya: [name] Treating Linh Hedberg/Extender: [name], [name] Weeks in Treatment: 0 Vital Signs Height(in): 67 Pulse(bpm): 90 Weight(lbs): 226 Blood Pressure(mmHg): 120/63 Body Mass Index(BMI): 35 Temperature(F): 98.7 Respiratory Rate 16 (breaths/min): Photos: [N/A:N/A] Wound Location: Left Malleolus - Medial N/A N/A Wounding Event: Gradually Appeared N/A N/A Primary  Etiology: Infection - not elsewhere N/A N/A classified Comorbid History: Sleep Apnea, Hypertension, N/A N/A Type II Diabetes, Osteoarthritis Date Acquired: 09/29/2017 N/A N/A Weeks of Treatment: 0 N/A N/A Wound Status: Open N/A N/A Clustered Wound: Yes N/A N/A Measurements L x W x D 0.9x4.5x2.2 N/A N/A (cm) Area (cm) : 3.181 N/A N/A Volume (cm) : 6.998 N/A N/A % Reduction in Area: 0.00% N/A N/A % Reduction in Volume: 0.00% N/A N/A Classification: Unclassifiable N/A N/A Exudate Amount: Large N/A N/A Exudate Type: Purulent N/A N/A Exudate Color: yellow, brown, green N/A N/A Wound Margin: Flat and Intact N/A N/A Granulation Amount: None Present (0%) N/A N/A Necrotic Amount: None Present (0%) N/A N/A Exposed Structures: Fascia: No N/A N/A Fat Layer (Subcutaneous Tissue) Exposed: No Tendon: No Muscle: No Wadding, Marquies ([id number]) Joint: No Bone: No Epithelialization: None N/A N/A Periwound Skin Texture: Excoriation: No N/A N/A Induration: No Callus: No Crepitus:  No Rash: No Scarring: No Periwound Skin Moisture: Maceration: No N/A N/A Dry/Scaly: No Periwound Skin Color: Ecchymosis: Yes N/A N/A Atrophie Blanche: No Cyanosis: No Erythema: No Hemosiderin Staining: No Mottled: No Pallor: No Rubor: No Temperature: No Abnormality N/A N/A Tenderness on Palpation: No N/A N/A Wound Preparation: Ulcer Cleansing: N/A N/A Rinsed/Irrigated with Saline Topical Anesthetic Applied: Other: lidocaine 4% Treatment Notes Electronic Signature(s) Signed: 10/02/2017 4:10:39 PM By: [name] Entered By: [name] on 10/02/2017 15:35:04 Recore, [name] ([id number]) -------------------------------------------------------------------------------- Multi-Disciplinary Care Plan Details Patient Name: [name] Date of Service: 10/02/2017 2:45 PM Medical Record Number: [id number] Patient Account Number: [id number] Date of Birth/Sex: [birth year] (56 y.o. M) Treating [name]: [name] Primary Care Marguerite Barba: [name] Other Clinician: Referring Hagop Mccollam: [name] Treating Allean Montfort/Extender: [name], [name] Weeks in Treatment: 0 Active Inactive ` Abuse / Safety / Falls / Self Care Management Nursing Diagnoses: Potential for falls Goals: Patient will not experience any injury related to falls Date Initiated: 10/02/2017 Target Resolution Date: 02/09/2018 Goal Status: Active Interventions: Assess fall risk on admission and as needed Assess: immobility, friction, shearing, incontinence upon admission and as needed Assess impairment of mobility on admission and as needed per policy Assess personal safety and home safety (as indicated) on admission and as needed Assess self care needs on admission and as needed Notes: ` Nutrition Nursing Diagnoses: Imbalanced nutrition Impaired glucose control: actual or potential Potential for alteratiion in Nutrition/Potential for imbalanced nutrition Goals: Patient/caregiver agrees to  and verbalizes understanding of need to use nutritional supplements and/or vitamins as prescribed Date Initiated: 10/02/2017 Target Resolution Date: 01/05/2018 Goal Status: Active Patient/caregiver will maintain therapeutic glucose control Date Initiated: 10/02/2017 Target Resolution Date: 02/09/2018 Goal Status: Active Interventions: Assess patient nutrition upon admission and as needed per policy Provide education on elevated blood sugars and impact on wound healing Provide education on nutrition Notes: SERAPIO, EDELSON ([id number]) Orientation to the Wound Care Program Nursing Diagnoses: Knowledge deficit related to the wound healing center program Goals: Patient/caregiver will verbalize understanding of the [facility] Date Initiated: 10/02/2017 Target Resolution Date: 11/10/2017 Goal Status: Active Interventions: Provide education on orientation to the wound center Notes: ` Pain, Acute or Chronic Nursing Diagnoses: Pain, acute or chronic: actual or potential Potential alteration in comfort, pain Goals: Patient/caregiver will verbalize adequate pain control between visits Date Initiated: 10/02/2017 Target Resolution Date: 02/09/2018 Goal Status: Active Interventions: Complete pain assessment as per visit requirements Notes: ` Soft Tissue Infection Nursing Diagnoses: Impaired tissue integrity Knowledge deficit related to disease process and management Potential for infection: soft tissue Goals: Patient/caregiver  will verbalize understanding of or measures to prevent infection and contamination in the home setting Date Initiated: 10/02/2017 Target Resolution Date: 02/09/2018 Goal Status: Active Patient's soft tissue infection will resolve Date Initiated: 10/02/2017 Target Resolution Date: 01/05/2018 Goal Status: Active Signs and symptoms of infection will be recognized early to allow for prompt treatment Date Initiated: 10/02/2017 Target Resolution Date:  02/09/2018 Goal Status: Active Interventions: Assess signs and symptoms of infection every visit Glauber, Kolter ([id number]) Provide education on infection Notes: ` Wound/Skin Impairment Nursing Diagnoses: Impaired tissue integrity Knowledge deficit related to ulceration/compromised skin integrity Goals: Ulcer/skin breakdown will have a volume reduction of 80% by week 12 Date Initiated: 10/02/2017 Target Resolution Date: 02/02/2018 Goal Status: Active Interventions: Assess patient/caregiver ability to obtain necessary supplies Assess patient/caregiver ability to perform ulcer/skin care regimen upon admission and as needed Assess ulceration(s) every visit Notes: Electronic Signature(s) Signed: 10/02/2017 4:10:39 PM By: [name] Entered By: [name] on 10/02/2017 15:34:53 Drach, [name] ([id number]) -------------------------------------------------------------------------------- Pain Assessment Details Patient Name: [name] Date of Service: 10/02/2017 2:45 PM Medical Record Number: [id number] Patient Account Number: [id number] Date of Birth/Sex: 07-04-1960 (56 y.o. M) Treating [name]: [name] Primary Care Kemani Heidel: [name] Other Clinician: Referring Jadrian Bulman: [name] Treating Albert Devaul/Extender: [name] Weeks in Treatment: 0 Active Problems Location of Pain Severity and Description of Pain Patient Has Paino No Site Locations With Dressing Change: No Duration of the Pain. Constant / Intermittento Constant Rate the pain. Current Pain Level: 10 Character of Pain Describe the Pain: Stabbing Pain Management and Medication Current Pain Management: Electronic Signature(s) Signed: 10/02/2017 4:47:16 PM By: [name], BSN Entered By: [name] on 10/02/2017 14:52:13 Badders, [name]  ([id number]) -------------------------------------------------------------------------------- Patient/Caregiver Education Details Patient Name: [name] Date of Service: 10/02/2017 2:45 PM Medical Record Number: [id number] Patient Account Number: [id number] Date of Birth/Gender: 1960/08/11 (56 y.o. M) Treating [name]: [name] Primary Care Physician: [name] Other Clinician: Referring Physician: Vidal [name] Treating Physician/Extender: [name] Weeks in Treatment: 0 Education Assessment Education Provided To: Patient Education Topics Provided Elevated Blood Sugar/ Impact on Healing: Handouts: Elevated Blood Sugars: How Do They Affect Wound Healing Methods: Explain/Verbal Responses: State content correctly Infection: Handouts: Infection Prevention and Management Methods: Explain/Verbal Responses: State content correctly Nutrition: Handouts: Elevated Blood Sugars: How Do They Affect Wound Healing Methods: Explain/Verbal Responses: State content correctly Welcome To The [facility]: Handouts: Welcome To [facility] Methods: Explain/Verbal Responses: State content correctly Wound/Skin Impairment: Handouts: Other: Go straight to the ER Methods: Explain/Verbal Responses: State content correctly Electronic Signature(s) Signed: 10/02/2017 4:10:39 PM By: [name] Entered By: [name] on 10/02/2017 15:43:51 Ullmer, [name] ([id number]) -------------------------------------------------------------------------------- Wound Assessment Details Patient Name: [name] Date of Service: 10/02/2017 2:45 PM Medical Record Number: [id number] Patient Account Number: [id number] Date of Birth/Sex: 10-Feb-1961 (56 y.o. M) Treating [name]: [name] Primary Care Tomi Grandpre: [name] Other Clinician: Referring Marlyne Totaro: [name] Treating Dowell Hoon/Extender: [name], [name] Weeks in Treatment: 0 Wound Status Wound  Number: 1 Primary Infection - not elsewhere classified Etiology: Wound Location: Left Malleolus - Medial Wound Status: Open Wounding Event: Gradually Appeared Comorbid Sleep Apnea, Hypertension, Type II Date Acquired: 09/29/2017 History: Diabetes, Osteoarthritis Weeks Of Treatment: 0 Clustered Wound: Yes Photos Photo Uploaded By: [name] on 10/02/2017 15:24:58 Wound Measurements Length: (cm) 0.9 % Reducti Width: (cm) 4.5 % Reducti Depth: (cm) 2.2 Epithelia Area: (cm) 3.181 Volume: (cm) 6.998 on in Area: 0% on in  Volume: 0% lization: None Wound Description Classification: Unclassifiable Foul Odor Wound Margin: Flat and Intact Slough/Fi Exudate Amount: Large Exudate Type: Purulent Exudate Color: yellow, brown, green After Cleansing: No brino No Wound Bed Granulation Amount: None Present (0%) Exposed Structure Necrotic Amount: None Present (0%) Fascia Exposed: No Fat Layer (Subcutaneous Tissue) Exposed: No Tendon Exposed: No Muscle Exposed: No Joint Exposed: No Bone Exposed: No Periwound Skin Texture Weigelt, Edwyn ([id number]) Texture Color No Abnormalities Noted: No No Abnormalities Noted: No Callus: No Atrophie Blanche: No Crepitus: No Cyanosis: No Excoriation: No Ecchymosis: Yes Induration: No Erythema: No Rash: No Hemosiderin Staining: No Scarring: No Mottled: No Pallor: No Moisture Rubor: No No Abnormalities Noted: No Dry / Scaly: No Temperature / Pain Maceration: No Temperature: No Abnormality Wound Preparation Ulcer Cleansing: Rinsed/Irrigated with Saline Topical Anesthetic Applied: Other: lidocaine 4%, Electronic Signature(s) Signed: 10/02/2017 4:47:16 PM By: [name], BSN Entered By: [name] on 10/02/2017 15:09:25 Calamari, [name] ([id number]) -------------------------------------------------------------------------------- Vitals Details Patient Name: [name] Date  of Service: 10/02/2017 2:45 PM Medical Record Number: [id number] Patient Account Number: [id number] Date of Birth/Sex: [birth year] (56 y.o. M) Treating [name]: [name] Primary Care Terrace Fontanilla: [name] Other Clinician: Referring Violet Cart: [name] Treating Navea Woodrow/Extender: [name] Weeks in Treatment: 0 Vital Signs Time Taken: 14:52 Temperature (F): 98.7 Height (in): 67 Pulse (bpm): 90 Source: Measured Respiratory Rate (breaths/min): 16 Weight (lbs): 226 Blood Pressure (mmHg): 120/63 Source: Measured Reference Range: 80 - 120 mg / dl Body Mass Index (BMI): 35.4 Electronic Signature(s) Signed: 10/02/2017 4:47:16 PM By: [name], BSN Entered By: [name] on 10/02/2017 14:52:48

## 2017-10-03 NOTE — Progress Notes (Signed)
Pharmacy Antibiotic Note  [name] is a 57 y.o. male admitted on 10/02/2017 with wound infection of his left foot.   Pharmacy has been consulted for Vancomycin dosing for wound infection with concern for possible osteomyelitis (Xray inconclusive). He has a h/o surgery on this ankle multiple times in the past.   WBC is trending down. Afebrile.  SCr has increased (0.78 to 1.26) with normalized CrCl ~65-70 mL/min.   Plan: Reduce Vancomycin 750 mg IV every 12 hours.  Continue Zosyn 3.375g IV every 8 hours- EI Monitor clinical status, renal function, cultures, ssVT prn   Height: 5\' 7"  (170.2 cm) Weight: 226 lb (102.5 kg) IBW/kg (Calculated) : 66.1  Temp (24hrs), Avg:98.9 F (37.2 C), Min:98.3 F (36.8 C), Max:99.5 F (37.5 C)  Recent Labs  Lab 10/02/17 1717 10/02/17 1718 10/03/17 0608  WBC 15.8*  --  12.9*  CREATININE 0.78  --  1.26*  LATICACIDVEN  --  1.7  --     Estimated Creatinine Clearance: 72.9 mL/min (A) (by C-G formula based on SCr of 1.26 mg/dL (H)).    No Known Allergies  Antimicrobials this admission: Zosyn x1 10/02/17 Vanco 7/2>>  Dose adjustments this admission:   Microbiology results: 7/2 BCx: sent  Thank you for allowing pharmacy to be a part of this patient's care.  Sloan Leiter, PharmD, BCPS, BCCCP Clinical Pharmacist Clinical phone 10/03/2017 until 3:30PM [phone] After hours, please call [phone] 10/03/2017 1:05 PM

## 2017-10-03 NOTE — Progress Notes (Signed)
Inpatient Diabetes Program Recommendations  AACE/ADA: New Consensus Statement on Inpatient Glycemic Control (2015)  Target Ranges:  Prepandial:   less than 140 mg/dL      Peak postprandial:   less than 180 mg/dL (1-2 hours)      Critically ill patients:  140 - 180 mg/dL   Lab Results  Component Value Date   GLUCAP 200 (H) 10/03/2017   HGBA1C 11.4 (H) 10/02/2017    Review of Glycemic Control  Diabetes history: Type 2 Outpatient Diabetes medications: Lantus 55 units daily, Novolog 5 units TID, Metformin Current orders for Inpatient glycemic control: Novolog SENSITIVE TID  Inpatient Diabetes Program Recommendations:    CBGs greater than 180 mg/dl. Recommend starting Lantus 25 units daily (1/2 of home dose) and continue Novolog SENSITIVE correction scale TID if blood sugars continue to be elevated. Will continue to monitor blood sugars while in the hospital.  Harvel Ricks RN BSN CDE Diabetes Coordinator Pager: [phone]  8am-5pm

## 2017-10-03 NOTE — Consult Note (Signed)
[facility] Nurse wound consult note Reason for Consult: full thickness ulcer medial heel and med ankle Wound type:multisystem causes, venous insufficiency, PVD,and  diabetes Pressure Injury POA: NA Measurement: 2cm x 4cm x unknown depth  Wound bed: 70% black 30% pink Drainage (amount, consistency, odor) copious serosanguinous  Periwound: 5cm x 7cm total area with the surrounding perimeter of wound is edematous, and boggy Dressing procedure/placement/frequency: [name] has already consulted and plans to take patient to OR for amputation Friday, 7/5. Malnutrition has been addressed with Ensure supplements. Will place orders for conservative care to keep wound clean until date of surgery. My orders will be effective only until OR. New wound care orders by [name] will replace mine. I have provided nurses with orders for Aquacel Ag+ dressing changes. We will not follow, but will remain available to this patient, to nursing, and the medical and/or surgical teams.  Please re-consult if we need to assist further.  Fara Olden, RN-C, WTA-C, [place] Wound Treatment Associate Ostomy Care Associate

## 2017-10-03 NOTE — Progress Notes (Signed)
MAUI, BRITTEN ([id number]) Visit Report for 10/02/2017 Abuse/Suicide Risk Screen Details Patient Name: [name], [name] Date of Service: 10/02/2017 2:45 PM Medical Record Number: [id number] Patient Account Number: [id number] Date of Birth/Sex: 1960/06/05 (57 y.o. M) Treating RN: Cornell Barman Primary Care Meryl Hubers: Vidal Schwalbe Other Clinician: Referring Vincente Asbridge: Vidal Schwalbe Treating Kylil Swopes/Extender: Melburn Hake, HOYT Weeks in Treatment: 0 Abuse/Suicide Risk Screen Items Answer ABUSE/SUICIDE RISK SCREEN: Has anyone close to you tried to hurt or harm you recentlyo No Do you feel uncomfortable with anyone in your familyo No Has anyone forced you do things that you didnot want to doo No Do you have any thoughts of harming yourselfo No Patient displays signs or symptoms of abuse and/or neglect. No Electronic Signature(s) Signed: 10/02/2017 4:47:16 PM By: Gretta Cool, BSN, RN, CWS, Kim RN, BSN Entered By: Gretta Cool, BSN, RN, CWS, Kim on 10/02/2017 15:00:16 Sedivy, [name] ([id number]) -------------------------------------------------------------------------------- Activities of Daily Living Details Patient Name: [name] Date of Service: 10/02/2017 2:45 PM Medical Record Number: [id number] Patient Account Number: [id number] Date of Birth/Sex: 06-20-1960 (56 y.o. M) Treating RN: Cornell Barman Primary Care Kalena Mander: Vidal Schwalbe Other Clinician: Referring Nosson Wender: Vidal Schwalbe Treating Emmalin Jaquess/Extender: Melburn Hake, HOYT Weeks in Treatment: 0 Activities of Daily Living Items Answer Activities of Daily Living (Please select one for each item) Drive Automobile Not Able Take Medications Completely Able Use Telephone Completely Able Care for Appearance Completely Able Use Toilet Completely Able Bath / Shower Completely Able Dress Self Completely Able Feed Self Completely Able Walk Completely Able Get In / Out Bed Completely Able Housework Completely Able Prepare Meals  Completely Emily for Self Completely Able Electronic Signature(s) Signed: 10/02/2017 4:47:16 PM By: Gretta Cool, BSN, RN, CWS, Kim RN, BSN Entered By: Gretta Cool, BSN, RN, CWS, Kim on 10/02/2017 15:00:37 [name], [name] ([id number]) -------------------------------------------------------------------------------- Education Assessment Details Patient Name: [name] Date of Service: 10/02/2017 2:45 PM Medical Record Number: [id number] Patient Account Number: [id number] Date of Birth/Sex: 1960-06-08 (56 y.o. M) Treating RN: Cornell Barman Primary Care Filiberto Wamble: Vidal Schwalbe Other Clinician: Referring Henessy Rohrer: Vidal Schwalbe Treating Kalieb Freeland/Extender: Melburn Hake, HOYT Weeks in Treatment: 0 Primary Learner Assessed: Patient Learning Preferences/Education Level/Primary Language Learning Preference: Explanation, Demonstration Highest Education Level: High School Preferred Language: English Cognitive Barrier Assessment/Beliefs Language Barrier: No Translator Needed: No Memory Deficit: No Emotional Barrier: No Cultural/Religious Beliefs Affecting Medical Care: No Physical Barrier Assessment Impaired Vision: No Impaired Hearing: No Decreased Hand dexterity: No Knowledge/Comprehension Assessment Knowledge Level: Medium Comprehension Level: Medium Ability to understand written Medium instructions: Ability to understand verbal Medium instructions: Motivation Assessment Anxiety Level: Calm Cooperation: Cooperative Education Importance: Acknowledges Need Interest in Health Problems: Asks Questions Perception: Coherent Willingness to Engage in Self- Medium Management Activities: Readiness to Engage in Self- Medium Management Activities: Electronic Signature(s) Signed: 10/02/2017 4:47:16 PM By: Gretta Cool, BSN, RN, CWS, Kim RN, BSN Entered By: Gretta Cool, BSN, RN, CWS, Kim on 10/02/2017 15:01:12 [name], [name]  ([id number]) -------------------------------------------------------------------------------- Fall Risk Assessment Details Patient Name: [name] Date of Service: 10/02/2017 2:45 PM Medical Record Number: [id number] Patient Account Number: [id number] Date of Birth/Sex: 05/20/1960 (56 y.o. M) Treating RN: Cornell Barman Primary Care Chabeli Barsamian: Vidal Schwalbe Other Clinician: Referring Gurdeep Keesey: Vidal Schwalbe Treating Sharran Caratachea/Extender: Melburn Hake, HOYT Weeks in Treatment: 0 Fall Risk Assessment Items Have you had 2 or more falls in the last 12 monthso 0 Yes Have you had any fall that resulted in injury in the last 12 monthso 0 No FALL RISK ASSESSMENT: History of falling - immediate or within 3 months 0  No Secondary diagnosis 0 No Ambulatory aid None/bed rest/wheelchair/nurse 0 Yes Crutches/cane/walker 0 No Furniture 0 No IV Access/Saline Lock 0 No Gait/Training Normal/bed rest/immobile 0 Yes Weak 0 No Impaired 0 No Mental Status Oriented to own ability 0 Yes Electronic Signature(s) Signed: 10/02/2017 4:47:16 PM By: Gretta Cool, BSN, RN, CWS, Kim RN, BSN Entered By: Gretta Cool, BSN, RN, CWS, Kim on 10/02/2017 15:01:54 Nes[place]wat, [name] ([id number]) -------------------------------------------------------------------------------- Foot Assessment Details Patient Name: [name] Date of Service: 10/02/2017 2:45 PM Medical Record Number: [id number] Patient Account Number: [id number] Date of Birth/Sex: 07/21/1960 (56 y.o. M) Treating RN: Cornell Barman Primary Care Marl Seago: Vidal Schwalbe Other Clinician: Referring Vaeda Westall: Vidal Schwalbe Treating Zlatan Hornback/Extender: Melburn Hake, HOYT Weeks in Treatment: 0 Foot Assessment Items Site Locations + = Sensation present, - = Sensation absent, C = Callus, U = Ulcer R = Redness, W = Warmth, M = Maceration, PU = Pre-ulcerative lesion F = Fissure, S = Swelling, D = Dryness Assessment Right: Left: Other Deformity: No No Prior Foot Ulcer: No  No Prior Amputation: No No Charcot Joint: No No Ambulatory Status: Ambulatory With Help Assistance Device: Wheelchair Gait: Administrator, arts) Signed: 10/02/2017 4:47:16 PM By: Gretta Cool, BSN, RN, CWS, Kim RN, BSN Entered By: Gretta Cool, BSN, RN, CWS, Kim on 10/02/2017 15:03:53 Backs, [name] ([id number]) -------------------------------------------------------------------------------- Nutrition Risk Assessment Details Patient Name: [name] Date of Service: 10/02/2017 2:45 PM Medical Record Number: [id number] Patient Account Number: [id number] Date of Birth/Sex: [birth year] (56 y.o. M) Treating RN: Cornell Barman Primary Care Dezirae Service: Vidal Schwalbe Other Clinician: Referring Ineta Sinning: Vidal Schwalbe Treating Bebe Moncure/Extender: Melburn Hake, HOYT Weeks in Treatment: 0 Height (in): 67 Weight (lbs): 226 Body Mass Index (BMI): 35.4 Nutrition Risk Assessment Items NUTRITION RISK SCREEN: I have an illness or condition that made me change the kind and/or amount of 0 No food I eat I eat fewer than two meals per day 0 No I eat few fruits and vegetables, or milk products 2 Yes I have three or more drinks of beer, liquor or wine almost every day 0 No I have tooth or mouth problems that make it hard for me to eat 0 No I don't always have enough money to buy the food I need 0 No I eat alone most of the time 0 No I take three or more different prescribed or over-the-counter drugs a day 1 Yes Without wanting to, I have lost or gained 10 pounds in the last six months 0 No I am not always physically able to shop, cook and/or feed myself 0 No Nutrition Protocols Good Risk Protocol Provide education on elevated blood sugars and Moderate Risk Protocol 0 impact on wound healing, as applicable Electronic Signature(s) Signed: 10/02/2017 4:47:16 PM By: Gretta Cool, BSN, RN, CWS, Kim RN, BSN Entered By: Gretta Cool, BSN, RN, CWS, Kim on 10/02/2017 15:02:19

## 2017-10-03 NOTE — Progress Notes (Signed)
Family Medicine Teaching Service Daily Progress Note Intern Pager: [phone]  Patient name: [name] Medical record number: [id number] Date of birth: 1960-08-15 Age: 57 y.o. Gender: male  Primary Care Provider: Vidal Schwalbe, MD Consultants: ortho surg Code Status: fll  [name]:  7/2 admission 7/5 (scheduled transtibial amputation)  Assessment and Plan: [name] is a 57 y.o. male presenting with left ankle wound and LE swelling. PMH is significant for T2DM, HTN, PVD, gout, and tobacco use.  Infected L ankle/foot wound: Has had some pain and swelling since ORIF of L ankle >2yr but worsened since 2 weeks,denies recent injury. Left ankle/foot are swollen, erythematous, tender to touch from toes to knee with serosanguinous and mucopurulent secretions from open wound at L medial ankle. There is a concern for osteomyelitis, although ankle X-ray was non-conclusive.s/p 1x Zosyn given in the ED.  WBC 12.9, afebrile,  Xray innconclusive  -Ortho consulted, recs appreciated - MRI pending -ABI pending - f/u  blood Cx x 2 - Continue Zosyn (7/2- ) -cont vanc (7/2- ) per pharm - cont tylenol 650mg  PO q6 hrs -d/c toradol (AKI) -oxy 5mg  prn for breakthrough - NPO after midnight Thursday night, anticipating Friday amputation - daily CBC (wbc trending down)  T2DM: Takes Lantus daily, (has not been taking Novolog with meals) as well as metformin 2 g daily.  Last blood sugar checked at home was 261 a few days ago although most are ~400. CBG on admit 211.  Drinks A LOT of cool aid so may not need long acting with controlled diet inpatient.  A1C 11.4 - Insulin sensitive sliding scale TID AC and QHS; consider longer acting insulin if needed on 7/3  Anemia: denies having anemia before, however has been noted in [facility]. Endorses VERY dark stool.  Denies dizziness, lightheadedness, GI bleed, bright red blood per rectum.   Reports global weakness LEs >  UEs. Has never had a colonoscopy. Labs: RBC 2.62 L, Hgb 7.0 L, HCT 23.1 L, Plts 718 H.  MCV is wnl. - f/u iron studies, FOBT, CBC - Consider GI consult on 7/3 if FOBT is positive - Pt consents to blood transfusion as necessary -transfuse 1u RBC  HTN: BP on admission 114/72 takes amlodipine 10 mg daily, lisinopril 40 mg daily, and Toprol XL 25 mg daily.  - Continue BP regimen from home  Electrolyte Abnormality: hyponatremia noted on past labs and patient is asymptomatic. Labs on admit Na 130 L, Cl 94 L, Ca 8.8 L.  Chronic issue, this is near baseline - IV NS 100cc/hour for maintenance - f/u am CMP  Tobacco use: smokes 1 ppd, has been smoking for 50 years. Declines nicotine patch. - Nicotine patch per patient request  Constipation: denies BM since 2-4 days depending on who asks. Physical exam significant for soft right sided abdomen and firm left-sided abdomen. - Miralax BID scheduled  Hx alcohol use: Chart indicates a history of four alcoholic drinks per week, patient says he has not consumed alcohol in 3-4 months. - will start CIWA, can discontinue during admission if scores are low  FEN/GI: Carb modified, NPO at midnight thrusday, maintenance fluids Prophylaxis: DVT Heparin 5,000 Units SubQ q8 hrs  Disposition: admit to med-surg  Subjective:  Feels well other than pain in left leg, says he has been uncontrolled in diabetes for awhile now (takes insulin once daily, now short acting), does not seem to know about his anemia  Objective: Temp:  [98.3 F (36.8 C)-99.5 F (37.5  C)] 98.3 F (36.8 C) (07/03 0551) Pulse Rate:  [71-91] 71 (07/03 0551) Resp:  [16-29] 20 (07/03 0551) BP: (114-142)/(68-80) 122/68 (07/03 0551) SpO2:  [97 %-100 %] 100 % (07/03 0551) Weight:  [226 lb (102.5 kg)] 226 lb (102.5 kg) (07/02 1705) Physical Exam: General: comfprtable, pleasant, NAD Cardiovascular: RRR, no murmurs noted, distal pulse on left foot is very weak but present with cap refill ~4,  foot is cool.   Right leg normal. Respiratory: CTAB, no IWB, no wheezes Abdomen: slightly firm but not painful to touch, obese Extremities: open wound on left ankle, with discoloration up left calf, painful to touch  Laboratory: Recent Labs  Lab 10/02/17 1717 10/03/17 0608  WBC 15.8* 12.9*  HGB 7.4* 7.0*  HCT 23.1* 21.8*  PLT 718* 662*   Recent Labs  Lab 10/02/17 1717 10/03/17 0608  NA 130* 130*  K 4.3 4.1  CL 94* 97*  CO2 26 23  BUN 7 13  CREATININE 0.78 1.26*  CALCIUM 8.8* 8.0*  PROT  --  8.0  BILITOT  --  1.1  ALKPHOS  --  111  ALT  --  30  AST  --  24  GLUCOSE 184* 182*    Imaging/Diagnostic Tests: Dg Ankle Complete Left  Result Date: 10/02/2017 CLINICAL DATA:  Swelling) none drainage from LEFT ankle, abscess, history hardware EXAM: LEFT ANKLE COMPLETE - 3+ VIEW COMPARISON:  01/21/2015 FINDINGS: Diffuse osseous demineralization. Single screw at distal tibia. Advanced degenerative changes of the ankle joint with irregular joint space narrowing and significant marginal spur formation. Posttraumatic deformities of the distal tibia and fibula. Plantar calcaneal spur. Chronic sclerosis at the calcaneus. No acute fracture, dislocation or bone destruction. Scattered soft tissue swelling. IMPRESSION: Extensive posttraumatic and degenerative changes of the LEFT ankle as above. No definite acute bony abnormalities. If there is persistent clinical concern for osteomyelitis, recommend MR. Electronically Signed   By: Lavonia Dana M.D.   On: 10/02/2017 18:10     Sherene Sires, DO 10/03/2017, 8:35 AM PGY-2, [place] Intern pager: [phone], text pages welcome

## 2017-10-03 NOTE — Progress Notes (Signed)
VASCULAR LAB PRELIMINARY  ARTERIAL  ABI completed:    RIGHT    LEFT    PRESSURE WAVEFORM  PRESSURE WAVEFORM  BRACHIAL 110 Triphasic BRACHIAL Unable to obtain due to IV location Triphasic  DP 111 Monophasic DP 80 Dampened monophasic  AT   AT    PT 107 Monophasic PT  Unable to insonate due to bandaging  PER   PER    GREAT TOE 109 NA GREAT TOE Unable to obtain due to low amplitude waveform NA    RIGHT LEFT  ABI 1.01 0.73  TBI 0.99 Unable to obtain   The right ABI is within normal limits at rest. However, given abnormal waveforms, this may be falsely elevated secondary to medial calcification. The right TBI is within normal limits at rest. The left ABI is suggestive of moderate arterial insufficiency. Unable to obtain left TBI due to low amplitude great toe waveform.  10/03/2017 5:39 PM Maudry Mayhew, BS, RVT, RDCS, RDMS

## 2017-10-03 NOTE — Progress Notes (Signed)
Initial Nutrition Assessment  DOCUMENTATION CODES:   Obesity unspecified  INTERVENTION:   -D/c Ensure Enlive po BID, each supplement provides 350 kcal and 20 grams of protein -1 packet Juven BID, each packet provides 80 calories, 8 grams of carbohydrate, and 14 grams of amino acids; supplement contains CaHMB, glutamine, and arginine, to promote wound healing  NUTRITION DIAGNOSIS:   Increased nutrient needs related to wound healing as evidenced by estimated needs.  GOAL:   Patient will meet greater than or equal to 90% of their needs  MONITOR:   PO intake, Supplement acceptance, Labs, Weight trends, Skin, I & O's  REASON FOR ASSESSMENT:   Malnutrition Screening Tool    ASSESSMENT:   [name] is a 57 y.o. male presenting with left ankle wound and LE swelling. PMH is significant for T2DM, HTN, PVD, gout, and tobacco use.  Pt admitted with infected lt ankle/foot wound. Per orthopedics note, pt will require transtibial amputation (likely 10/05/17). Pt awaiting vascular studies.   Pt sleeping soundly at time of visit. He did not arouse to name being called at time of visit.   Wt hx reviewed; wt has been stable over the past year.   Albumin has a half-life of 21 days and is strongly affected by stress response and inflammatory process, therefore, do not expect to see an improvement in this lab value during acute hospitalization. When a patient presents with low albumin, it is likely skewed due to the acute inflammatory response. Note that low albumin is no longer used to diagnose malnutrition; [facility] uses the new malnutrition guidelines published by the American Society for Parenteral and Enteral Nutrition (A.S.P.E.N.) and the Academy of Nutrition and Dietetics (AND).    Last Hgb A1c: 11.4 (10/02/17), which is poorly controlled. PTA DM medications 5 units insulin aspart TID with meals, 55 units insulin glargine daily, and 500 mg metformin BID. Unable to obtain further DM  hx at this time.   Labs reviewed: Na: 130, CBGS: 200-257 (inpatient orders for glycemic control are 0-9 units insulin aspart TID with meals).  NUTRITION - FOCUSED PHYSICAL EXAM:    Most Recent Value  Orbital Region  No depletion  Upper Arm Region  No depletion  Thoracic and Lumbar Region  No depletion  Buccal Region  No depletion  Temple Region  No depletion  Clavicle Bone Region  No depletion  Clavicle and Acromion Bone Region  No depletion  Scapular Bone Region  No depletion  Dorsal Hand  No depletion  Patellar Region  No depletion  Anterior Thigh Region  No depletion  Posterior Calf Region  No depletion  Edema (RD Assessment)  None  Hair  Reviewed  Eyes  Reviewed  Mouth  Reviewed  Skin  Reviewed  Nails  Reviewed       Diet Order:   Diet Order           Diet NPO time specified  Diet effective midnight        Diet Carb Modified Fluid consistency: Thin; Room service appropriate? Yes  Diet effective now          EDUCATION NEEDS:   Not appropriate for education at this time  Skin:  Skin Assessment: Skin Integrity Issues: Skin Integrity Issues:: Diabetic Ulcer Diabetic Ulcer: lt medial ankle  Last BM:  10/02/17  Height:   Ht Readings from Last 1 Encounters:  10/02/17 5\' 7"  (1.702 m)    Weight:   Wt Readings from Last 1 Encounters:  10/02/17 226 lb (  102.5 kg)    Ideal Body Weight:  70 kg  BMI:  Body mass index is 35.4 kg/m.  Estimated Nutritional Needs:   Kcal:  1900-2100  Protein:  105-120 grams  Fluid:  > 1.9 L    Daleigh Pollinger A. Jimmye Norman, RD, LDN, CDE Pager: [phone] After hours Pager: [phone]
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
## 2017-10-03 NOTE — Social Work (Signed)
CSW acknowledging consult, for medication access/support please consult RN Case Management.  Of note- pt has Medicaid, will not be eligible for [facility] letter.   CSW signing off. Please consult if any additional needs arise.  Alexander Mt, [place] Work [phone]

## 2017-10-03 NOTE — Progress Notes (Signed)
[name], [name] ([id number]) Visit Report for 10/02/2017 Chief Complaint Document Details Patient Name: [name] Date of Service: 10/02/2017 2:45 PM Medical Record Number: [id number] Patient Account Number: [id number] Date of Birth/Sex: 07/19/1960 (57 y.o. M) Treating RN: Ahmed Prima Primary Care Provider: Vidal Schwalbe Other Clinician: Referring Provider: Vidal Schwalbe Treating Provider/Extender: Melburn Hake, Soumya Colson Weeks in Treatment: 0 Information Obtained from: Patient Chief Complaint Left ankle infection with hardware involvement Electronic Signature(s) Signed: 10/02/2017 3:55:10 PM By: Worthy Keeler PA-C Entered By: Worthy Keeler on 10/02/2017 15:45:41 [name], [name] ([id number]) -------------------------------------------------------------------------------- HPI Details Patient Name: [name] Date of Service: 10/02/2017 2:45 PM Medical Record Number: [id number] Patient Account Number: [id number] Date of Birth/Sex: 1960-12-02 (57 y.o. M) Treating RN: Ahmed Prima Primary Care Provider: Vidal Schwalbe Other Clinician: Referring Provider: Vidal Schwalbe Treating Provider/Extender: Melburn Hake, Drae Mitzel Weeks in Treatment: 0 History of Present Illness HPI Description: 10/02/17 on evaluation today patient presents for initial inspection or our clinic with a history of diabetes mellitus type II which he set for five years utilizing both insulin and oral medications he does not regularly check his glucose levels. His last check was actually 255 and he does not seem to be under good control. On he father has a history in 1989 of having fallen off a tractor and breaking his left ankle. Subsequently he did have hardware placed at that point. Five years ago he broke this ankle again. Since that time he really has not had any issues he tells me until about a year ago when he began to have swelling of the left ankle. However it wasn't until last Thursday, five days  ago, but he started to have a blister in the region of the left ankle. This is on the medial aspect. Subsequently on Sunday this open to begin draining purulent discharge. He also has been having significant pain with this. He writes this to be 10 out of 10. He has been tolerating loose dressings at this point although no specific interventions have been implemented other than seeing his primary care provider who referred him to [place]. Subsequently the patient does have family with him today they tell me they are concerned about osteomyelitis. Electronic Signature(s) Signed: 10/02/2017 3:55:10 PM By: Worthy Keeler PA-C Entered By: Worthy Keeler on 10/02/2017 15:49:04 [name], [name] ([id number]) -------------------------------------------------------------------------------- Physical Exam Details Patient Name: [name] Date of Service: 10/02/2017 2:45 PM Medical Record Number: [id number] Patient Account Number: [id number] Date of Birth/Sex: [birth year] (57 y.o. M) Treating RN: Ahmed Prima Primary Care Provider: Vidal Schwalbe Other Clinician: Referring Provider: Vidal Schwalbe Treating Provider/Extender: STONE III, Samon Dishner Weeks in Treatment: 0 Constitutional sitting or standing blood pressure is within target range for patient.. pulse regular and within target range for patient.Marland Kitchen respirations regular, non-labored and within target range for patient.Marland Kitchen temperature within target range for patient.. Well- nourished and well-hydrated in no acute distress. Eyes conjunctiva clear no eyelid edema noted. pupils equal round and reactive to light and accommodation. Ears, Nose, Mouth, and Throat no gross abnormality of ear auricles or external auditory canals. normal hearing noted during conversation. mucus membranes moist. Respiratory normal breathing without difficulty. clear to auscultation bilaterally. Cardiovascular regular rate and rhythm with normal S1, S2. 2+ dorsalis  pedis/posterior tibialis pulses. 2+ pitting edema of the left lower extremity. Gastrointestinal (GI) soft, non-tender, non-distended, +BS. no ventral hernia noted. Musculoskeletal unsteady while walking due to pain. Psychiatric this patient is able to make decisions and demonstrates good insight into disease process. Alert and Oriented x  3. pleasant and cooperative. Notes On evaluation today upon proving with a skinny probe I was actually able to touch the hardware on the medial aspect of the ankle and this ulcer seems to completely extend down to this region. There is a fluid-filled region that is not currently open overlying the opening that does extend down this far. This appears to be a fairly extensive abscess at this point in time which is draining purulent discharge at this point and appears based on evaluation to likely need further surgical debridement. Electronic Signature(s) Signed: 10/02/2017 3:55:10 PM By: Worthy Keeler PA-C Entered By: Worthy Keeler on 10/02/2017 15:50:58 [name], [name] ([id number]) -------------------------------------------------------------------------------- Physician Orders Details Patient Name: [name] Date of Service: 10/02/2017 2:45 PM Medical Record Number: [id number] Patient Account Number: [id number] Date of Birth/Sex: 02-04-1961 (57 y.o. M) Treating RN: Ahmed Prima Primary Care Provider: Vidal Schwalbe Other Clinician: Referring Provider: Vidal Schwalbe Treating Provider/Extender: Melburn Hake, Antwine Agosto Weeks in Treatment: 0 Verbal / Phone Orders: Yes Clinician: Pinkerton, Debi Read Back and Verified: Yes Diagnosis Coding Wound Cleansing Wound #1 Left,Medial Malleolus o Clean wound with Normal Saline. Anesthetic (add to Medication List) Wound #1 Left,Medial Malleolus o Topical Lidocaine 4% cream applied to wound bed prior to debridement (In Clinic Only). Primary Wound Dressing Wound #1 Left,Medial Malleolus o  Silver Alginate Secondary Dressing Wound #1 Left,Medial Malleolus o ABD pad o ABD and Kerlix/Conform Additional Orders / Instructions Wound #1 Left,Medial Malleolus o Other: - Go straight to the ER Patient Medications Allergies: No Known Drug Allergies Notifications Medication Indication Start End lidocaine DOSE 1 - topical 4 % cream - 1 cream topical Electronic Signature(s) Signed: 10/02/2017 3:55:10 PM By: Worthy Keeler PA-C Signed: 10/02/2017 4:10:39 PM By: Alric Quan Entered By: Alric Quan on 10/02/2017 15:39:14 Prieur, [name] ([id number]) -------------------------------------------------------------------------------- Prescription 10/02/2017 Patient Name: [name] Provider: Worthy Keeler PA-C Date of Birth: February 28, 1961 NPI#: [id number] Sex: M [name]#: [id number] Phone #: [phone] License #: Patient Address: [facility] [place], [place] [address] [address], [place], [place] [address] [phone] Allergies No Known Drug Allergies Medication Medication: Route: Strength: Form: lidocaine 4 % topical cream topical 4% cream Class: TOPICAL LOCAL ANESTHETICS Dose: Frequency / Time: Indication: 1 1 cream topical Number of Refills: Number of Units: 0 Generic Substitution: Start Date: End Date: One Time Use: Substitution Permitted No Note to Pharmacy: Signature(s): Date(s): Electronic Signature(s) Signed: 10/02/2017 3:55:10 PM By: Worthy Keeler PA-C Signed: 10/02/2017 4:10:39 PM By: Alric Quan Entered By: Alric Quan on 10/02/2017 15:39:15 Reep, [name] ([id number]) --------------------------------------------------------------------------------  Problem List Details Patient Name: [name] Date of Service: 10/02/2017 2:45 PM Medical Record Number: [id number] Patient Account Number: [id number] Date of  Birth/Sex: [birth year] (57 y.o. M) Treating RN: Ahmed Prima Primary Care Provider: Vidal Schwalbe Other Clinician: Referring Provider: Vidal Schwalbe Treating Provider/Extender: Melburn Hake, Leily Capek Weeks in Treatment: 0 Active Problems ICD-10 Evaluated Encounter Code Description Active Date Today Diagnosis L02.416 Cutaneous abscess of left lower limb 10/02/2017 No Yes E11.621 Type 2 diabetes mellitus with foot ulcer 10/02/2017 No Yes T84.59XA Infection and inflammatory reaction due to other internal joint 10/02/2017 No Yes prosthesis, initial encounter Inactive Problems Resolved Problems Electronic Signature(s) Signed: 10/02/2017 3:55:10 PM By: Worthy Keeler PA-C Entered By: Worthy Keeler on 10/02/2017 15:45:10 Cancelliere, [name] ([id number]) -------------------------------------------------------------------------------- Progress Note Details Patient Name: [name] Date of Service: 10/02/2017 2:45 PM Medical Record Number: [id number] Patient Account Number: [id number] Date  of Birth/Sex: 1960/07/12 (57 y.o. M) Treating RN: Ahmed Prima Primary Care Provider: Vidal Schwalbe Other Clinician: Referring Provider: Vidal Schwalbe Treating Provider/Extender: Melburn Hake, Kymoni Monday Weeks in Treatment: 0 Subjective Chief Complaint Information obtained from Patient Left ankle infection with hardware involvement History of Present Illness (HPI) 10/02/17 on evaluation today patient presents for initial inspection or our clinic with a history of diabetes mellitus type II which he set for five years utilizing both insulin and oral medications he does not regularly check his glucose levels. His last check was actually 255 and he does not seem to be under good control. On he father has a history in 1989 of having fallen off a tractor and breaking his left ankle. Subsequently he did have hardware placed at that point. Five years ago he broke this ankle again. Since that time he really has not  had any issues he tells me until about a year ago when he began to have swelling of the left ankle. However it wasn't until last Thursday, five days ago, but he started to have a blister in the region of the left ankle. This is on the medial aspect. Subsequently on Sunday this open to begin draining purulent discharge. He also has been having significant pain with this. He writes this to be 10 out of 10. He has been tolerating loose dressings at this point although no specific interventions have been implemented other than seeing his primary care provider who referred him to [place]. Subsequently the patient does have family with him today they tell me they are concerned about osteomyelitis. Wound History Patient presents with 1 open wound that has been present for approximately 2 days. Patient has been treating wound in the following manner: bandage over it. Laboratory tests have been performed in the last month. Patient reportedly has tested positive for an antibiotic resistant organism. Patient reportedly has not had testing performed to evaluate circulation in the legs. Patient experiences the following problems associated with their wounds: infection. Patient History Information obtained from Patient. Allergies No Known Drug Allergies Family History Cancer - Siblings, Diabetes - Mother, No family history of Heart Disease, Hypertension, Kidney Disease, Lung Disease, Seizures, Stroke, Thyroid Problems, Tuberculosis. Social History Current every day smoker - 1 pack daily, Marital Status - Single, Alcohol Use - Moderate - beer, Drug Use - Prior History, Caffeine Use - Never. Medical History Eyes Denies history of Cataracts, Glaucoma, Optic Neuritis Ear/Nose/Mouth/Throat Denies history of Chronic sinus problems/congestion, Middle ear problems Hematologic/Lymphatic Denies history of Anemia, Hemophilia, Human Immunodeficiency Virus, Lymphedema, Sickle Cell Disease Pollitt, Morry  ([id number]) Respiratory Patient has history of Sleep Apnea Denies history of Aspiration, Asthma, Chronic Obstructive Pulmonary Disease (COPD), Pneumothorax, Tuberculosis Cardiovascular Patient has history of Hypertension Denies history of Angina, Arrhythmia, Congestive Heart Failure, Coronary Artery Disease, Deep Vein Thrombosis, Hypotension, Myocardial Infarction, Peripheral Arterial Disease, Peripheral Venous Disease, Phlebitis, Vasculitis Gastrointestinal Denies history of Cirrhosis , Colitis, Crohn s, Hepatitis A, Hepatitis B, Hepatitis C Endocrine Patient has history of Type II Diabetes Denies history of Type I Diabetes Genitourinary Denies history of End Stage Renal Disease Immunological Denies history of Lupus Erythematosus, Raynaud s, Scleroderma Integumentary (Skin) Denies history of History of Burn, History of pressure wounds Musculoskeletal Patient has history of Osteoarthritis - knees Denies history of Gout, Rheumatoid Arthritis, Osteomyelitis Neurologic Denies history of Dementia, Neuropathy, Quadriplegia, Paraplegia, Seizure Disorder Oncologic Denies history of Received Chemotherapy, Received Radiation Psychiatric Denies history of Anorexia/bulimia, Confinement Anxiety Patient is treated with Insulin, Oral Agents. Blood sugar is  not tested. Review of Systems (ROS) Constitutional Symptoms (General Health) The patient has no complaints or symptoms. Eyes The patient has no complaints or symptoms. Ear/Nose/Mouth/Throat The patient has no complaints or symptoms. Hematologic/Lymphatic The patient has no complaints or symptoms. Respiratory Complains or has symptoms of Chronic or frequent coughs, Shortness of Breath. Cardiovascular Complains or has symptoms of LE edema. Denies complaints or symptoms of Chest pain. Gastrointestinal The patient has no complaints or symptoms. Endocrine The patient has no complaints or symptoms. Genitourinary The patient has no  complaints or symptoms. Immunological The patient has no complaints or symptoms. Integumentary (Skin) Complains or has symptoms of Wounds. Denies complaints or symptoms of Bleeding or bruising tendency, Breakdown, Swelling. Musculoskeletal The patient has no complaints or symptoms. Neurologic The patient has no complaints or symptoms. Oncologic [name], [name] ([id number]) The patient has no complaints or symptoms. Psychiatric The patient has no complaints or symptoms. Objective Constitutional sitting or standing blood pressure is within target range for patient.. pulse regular and within target range for patient.Marland Kitchen respirations regular, non-labored and within target range for patient.Marland Kitchen temperature within target range for patient.. Well- nourished and well-hydrated in no acute distress. Vitals Time Taken: 2:52 PM, Height: 67 in, Source: Measured, Weight: 226 lbs, Source: Measured, BMI: 35.4, Temperature: 98.7 F, Pulse: 90 bpm, Respiratory Rate: 16 breaths/min, Blood Pressure: 120/63 mmHg. Eyes conjunctiva clear no eyelid edema noted. pupils equal round and reactive to light and accommodation. Ears, Nose, Mouth, and Throat no gross abnormality of ear auricles or external auditory canals. normal hearing noted during conversation. mucus membranes moist. Respiratory normal breathing without difficulty. clear to auscultation bilaterally. Cardiovascular regular rate and rhythm with normal S1, S2. 2+ dorsalis pedis/posterior tibialis pulses. 2+ pitting edema of the left lower extremity. Gastrointestinal (GI) soft, non-tender, non-distended, +BS. no ventral hernia noted. Musculoskeletal unsteady while walking due to pain. Psychiatric this patient is able to make decisions and demonstrates good insight into disease process. Alert and Oriented x 3. pleasant and cooperative. General Notes: On evaluation today upon proving with a skinny probe I was actually able to touch the hardware  on the medial aspect of the ankle and this ulcer seems to completely extend down to this region. There is a fluid-filled region that is not currently open overlying the opening that does extend down this far. This appears to be a fairly extensive abscess at this point in time which is draining purulent discharge at this point and appears based on evaluation to likely need further surgical debridement. Integumentary (Hair, Skin) Wound #1 status is Open. Original cause of wound was Gradually Appeared. The wound is located on the Left,Medial Malleolus. The wound measures 0.9cm length x 4.5cm width x 2.2cm depth; 3.181cm^2 area and 6.998cm^3 volume. There is a large amount of purulent drainage noted. The wound margin is flat and intact. There is no granulation within the wound bed. There is no necrotic tissue within the wound bed. The periwound skin appearance exhibited: Ecchymosis. The Teel, Dallyn ([id number]) periwound skin appearance did not exhibit: Callus, Crepitus, Excoriation, Induration, Rash, Scarring, Dry/Scaly, Maceration, Atrophie Blanche, Cyanosis, Hemosiderin Staining, Mottled, Pallor, Rubor, Erythema. Periwound temperature was noted as No Abnormality. 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 Anesthetic (add to Medication List): Wound #1 Left,Medial Malleolus: Topical Lidocaine 4% cream applied to wound bed  prior to debridement (In Clinic Only). Primary Wound Dressing: Wound #1 Left,Medial Malleolus: Silver Alginate Secondary Dressing: Wound #1 Left,Medial Malleolus: ABD pad ABD and Kerlix/Conform Additional Orders / Instructions: Wound #1 Left,Medial Malleolus: Other: - Go straight to the ER The following  medication(s) was prescribed: lidocaine topical 4 % cream 1 1 cream topical was prescribed at facility At this point my suggestion was after evaluating the patient and seeing the extent of his wound that he really needs to go to the ER today in order to have this further evaluated. I think that he may ward IV antibiotic therapy possibly even surgical debridement at this time. In fact I think both would be an excellent idea. With that being said we did applied a dressing per above to get into that point. We will subsequently see were things stand at follow-up depending on the outcome of his hospital stay. I explained that were more than happy to provide wound care services in the future right now I think this has a much greater need for emergent care which can be received only at the emergency department/hospital. The patient and family are completely in agreement. I am providing a copy of this note to take with him to the ER as well. Electronic Signature(s) Signed: 10/02/2017 3:55:10 PM By: Worthy Keeler PA-C Entered By: Worthy Keeler on 10/02/2017 15:52:42 Brummitt, [name] ([id number]) [name], NEEDS ([id number]) -------------------------------------------------------------------------------- ROS/PFSH Details Patient Name: [name] Date of Service: 10/02/2017 2:45 PM Medical Record Number: [id number] Patient Account Number: [id number] Date of Birth/Sex: 1960-06-09 (57 y.o. M) Treating RN: Cornell Barman Primary Care Provider: Vidal Schwalbe Other Clinician: Referring Provider: Vidal Schwalbe Treating Provider/Extender: Melburn Hake, Raquel Sayres Weeks in Treatment: 0 Information Obtained From Patient Wound History Do you currently have one or more open woundso Yes How many open wounds do you currently haveo 1 Approximately how long have you had your woundso 2 days How have you been treating your wound(s) until nowo bandage over it Have you had any tests for circulation on your  legso No Have you had other problems associated with your woundso Infection Respiratory Complaints and Symptoms: Positive for: Chronic or frequent coughs; Shortness of Breath Medical History: Positive for: Sleep Apnea Negative for: Aspiration; Asthma; Chronic Obstructive Pulmonary Disease (COPD); Pneumothorax; Tuberculosis Cardiovascular Complaints and Symptoms: Positive for: LE edema Negative for: Chest pain Medical History: Positive for: Hypertension Negative for: Angina; Arrhythmia; Congestive Heart Failure; Coronary Artery Disease; Deep Vein Thrombosis; Hypotension; Myocardial Infarction; Peripheral Arterial Disease; Peripheral Venous Disease; Phlebitis; Vasculitis Integumentary (Skin) Complaints and Symptoms: Positive for: Wounds Negative for: Bleeding or bruising tendency; Breakdown; Swelling Medical History: Negative for: History of Burn; History of pressure wounds Constitutional Symptoms (General Health) Complaints and Symptoms: No Complaints or Symptoms Eyes Complaints and Symptoms: No Complaints or Symptoms Medical History: Volkov, [name] ([id number]) Negative for: Cataracts; Glaucoma; Optic Neuritis Ear/Nose/Mouth/Throat Complaints and Symptoms: No Complaints or Symptoms Medical History: Negative for: Chronic sinus problems/congestion; Middle ear problems Hematologic/Lymphatic Complaints and Symptoms: No Complaints or Symptoms Medical History: Negative for: Anemia; Hemophilia; Human Immunodeficiency Virus; Lymphedema; Sickle Cell Disease Gastrointestinal Complaints and Symptoms: No Complaints or Symptoms Medical History: Negative for: Cirrhosis ; Colitis; Crohnos; Hepatitis A; Hepatitis B; Hepatitis C Endocrine Complaints and Symptoms: No Complaints or Symptoms Medical History: Positive for: Type II Diabetes Negative for: Type I Diabetes Time with diabetes: 5 years Treated with: Insulin, Oral agents Blood sugar tested every day:  No Genitourinary Complaints and Symptoms: No Complaints or Symptoms Medical History: Negative for: End  Stage Renal Disease Immunological Complaints and Symptoms: No Complaints or Symptoms Medical History: Negative for: Lupus Erythematosus; Raynaudos; Scleroderma Musculoskeletal Complaints and Symptoms: No Complaints or Symptoms Reckart, Future ([id number]) Medical History: Positive for: Osteoarthritis - knees Negative for: Gout; Rheumatoid Arthritis; Osteomyelitis Neurologic Complaints and Symptoms: No Complaints or Symptoms Medical History: Negative for: Dementia; Neuropathy; Quadriplegia; Paraplegia; Seizure Disorder Oncologic Complaints and Symptoms: No Complaints or Symptoms Medical History: Negative for: Received Chemotherapy; Received Radiation Psychiatric Complaints and Symptoms: No Complaints or Symptoms Medical History: Negative for: Anorexia/bulimia; Confinement Anxiety Immunizations Pneumococcal Vaccine: Received Pneumococcal Vaccination: No Implantable Devices Family and Social History Cancer: Yes - Siblings; Diabetes: Yes - Mother; Heart Disease: No; Hypertension: No; Kidney Disease: No; Lung Disease: No; Seizures: No; Stroke: No; Thyroid Problems: No; Tuberculosis: No; Current every day smoker - 1 pack daily; Marital Status - Single; Alcohol Use: Moderate - beer; Drug Use: Prior History; Caffeine Use: Never; Advanced Directives: No; Patient does not want information on Advanced Directives; Do not resuscitate: No; Living Will: No Electronic Signature(s) Signed: 10/02/2017 3:55:10 PM By: Worthy Keeler PA-C Signed: 10/02/2017 4:47:16 PM By: Gretta Cool, BSN, RN, CWS, Kim RN, BSN Entered By: Gretta Cool, BSN, RN, CWS, Kim on 10/02/2017 15:00:04 Carter, [name] ([id number]) -------------------------------------------------------------------------------- Magazine Details Patient Name: [name] Date of Service: 10/02/2017 Medical Record Number:  [id number] Patient Account Number: [id number] Date of Birth/Sex: 03-03-1961 (57 y.o. 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

## 2017-10-04 LAB — COMPREHENSIVE METABOLIC PANEL
ALBUMIN: 2 g/dL — AB (ref 3.5–5.0)
ALT: 31 U/L (ref 0–44)
AST: 25 U/L (ref 15–41)
Alkaline Phosphatase: 120 U/L (ref 38–126)
Anion gap: 7 (ref 5–15)
BILIRUBIN TOTAL: 0.6 mg/dL (ref 0.3–1.2)
BUN: 17 mg/dL (ref 6–20)
CO2: 25 mmol/L (ref 22–32)
Calcium: 8.1 mg/dL — ABNORMAL LOW (ref 8.9–10.3)
Chloride: 99 mmol/L (ref 98–111)
Creatinine, Ser: 1.61 mg/dL — ABNORMAL HIGH (ref 0.61–1.24)
GFR calc Af Amer: 53 mL/min — ABNORMAL LOW (ref >60)
GFR calc non Af Amer: 46 mL/min — ABNORMAL LOW (ref >60)
GLUCOSE: 244 mg/dL — AB (ref 70–99)
POTASSIUM: 4.2 mmol/L (ref 3.5–5.1)
Sodium: 131 mmol/L — ABNORMAL LOW (ref 135–145)
TOTAL PROTEIN: 7.5 g/dL (ref 6.5–8.1)

## 2017-10-04 LAB — CBC
HEMATOCRIT: 24.2 % — AB (ref 39.0–52.0)
Hemoglobin: 7.8 g/dL — ABNORMAL LOW (ref 13.0–17.0)
MCH: 28.5 pg (ref 26.0–34.0)
MCHC: 32.2 g/dL (ref 30.0–36.0)
MCV: 88.3 fL (ref 78.0–100.0)
Platelets: 632 10*3/uL — ABNORMAL HIGH (ref 150–400)
RBC: 2.74 MIL/uL — AB (ref 4.22–5.81)
RDW: 13.7 % (ref 11.5–15.5)
WBC: 11.6 10*3/uL — AB (ref 4.0–10.5)

## 2017-10-04 LAB — GLUCOSE, CAPILLARY
GLUCOSE-CAPILLARY: 290 mg/dL — AB (ref 70–99)
Glucose-Capillary: 248 mg/dL — ABNORMAL HIGH (ref 70–99)
Glucose-Capillary: 215 mg/dL — ABNORMAL HIGH (ref 70–99)
Glucose-Capillary: 290 mg/dL — ABNORMAL HIGH (ref 70–99)

## 2017-10-04 LAB — HEMOGLOBIN AND HEMATOCRIT, BLOOD
HEMATOCRIT: 27.8 % — AB (ref 39.0–52.0)
HEMOGLOBIN: 8.9 g/dL — AB (ref 13.0–17.0)

## 2017-10-04 LAB — PREPARE RBC (CROSSMATCH)

## 2017-10-04 MED ORDER — INSULIN GLARGINE 100 UNIT/ML ~~LOC~~ SOLN
4.0000 [IU] | Freq: Once | SUBCUTANEOUS | Status: AC
  Administered 2017-10-04: 4 [IU] via SUBCUTANEOUS
  Filled 2017-10-04: qty 0.04

## 2017-10-04 MED ORDER — OXYCODONE HCL 5 MG PO TABS
10.0000 mg | ORAL_TABLET | Freq: Four times a day (QID) | ORAL | Status: DC | PRN
  Administered 2017-10-04 – 2017-10-05 (×3): 10 mg via ORAL
  Filled 2017-10-04 (×3): qty 2

## 2017-10-04 MED ORDER — POLYETHYLENE GLYCOL 3350 17 G PO PACK
17.0000 g | PACK | Freq: Every day | ORAL | Status: DC
  Administered 2017-10-05 – 2017-10-09 (×5): 17 g via ORAL
  Filled 2017-10-04 (×5): qty 1

## 2017-10-04 MED ORDER — SODIUM CHLORIDE 0.9% IV SOLUTION
Freq: Once | INTRAVENOUS | Status: AC
  Administered 2017-10-04: 16:00:00 via INTRAVENOUS

## 2017-10-04 MED ORDER — CHLORHEXIDINE GLUCONATE 4 % EX LIQD
60.0000 mL | Freq: Once | CUTANEOUS | Status: DC
  Filled 2017-10-04: qty 60

## 2017-10-04 NOTE — Progress Notes (Signed)
Family Medicine Teaching Service Daily Progress Note Intern Pager: [phone]  Patient name: [name] Medical record number: [id number] Date of birth: [birth year] Age: 57 y.o. Gender: male  Primary Care Provider: Vidal Schwalbe, MD Consultants: ortho surg Code Status: fll  [name]:  7/2 admission 7/5 (scheduled transtibial amputation)  Assessment and Plan: [name] is a 57 y.o. male presenting with left ankle wound and LE swelling. PMH is significant for T2DM, HTN, PVD, gout, and tobacco use.  L calcaneal Osteomyelitis: Has had some pain and swelling since ORIF of L ankle >80yr but worsened since 2 weeks,denies recent injury. Left ankle/foot are swollen, erythematous, tender to touch from toes to knee with serosanguinous and mucopurulent secretions from open wound at L medial ankle. MRI 7/3 reveals chronic complex osteomyelitis involving calcaneus and draining medially into large soft tissue abscess extending to open wound.  s/p 1x Zosyn given in the ED.  WBC 11.6, afebrile. ABI on 7/3, L 0.73, R 1.01. -Ortho consulted, recs appreciated - f/u  blood Cx x 2 - both no growth >24 hrs - Continue Zosyn (7/2- ) -cont vanc (7/2- ) per pharm - cont tylenol 650mg  PO q6 hrs -d/c toradol (AKI) -increase oxy to 10mg  q6 hrs prn - NPO after midnight Thursday night, anticipating Friday amputation - daily CBC (wbc trending down)  T2DM: Takes Lantus daily, (has not been taking Novolog with meals) as well as metformin 2 g daily.  Last blood sugar checked at home was 261 a few days ago although most are ~400. CBG on admit 211.  Drinks A LOT of cool aid so may not need long acting with controlled diet inpatient.  A1C 11.4.  BS 290 this AM at 0806. Received 10 total units of Novolog in last 24 hrs. - Insulin sensitive sliding scale TID AC and QHS - Add Lantus 4u Now x1, reassess 7/6 following procedure   Anemia: denies having anemia before, however has  been noted in [place]. Endorses VERY dark stool.  Denies dizziness, lightheadedness, GI bleed, bright red blood per rectum.   Reports global weakness LEs > UEs. Has never had a colonoscopy.Hgb 8.2 s/p 1 unit pRBC on 7/3. Iron studies 7/2: Iron deficiency anemia, iron 35, TIBC 220, ferritin 936.  Patient had BM 7/3 without frank blood, nursing did not test.  Hgb 7.8 on 7/4. - After infection is resolved, should begin iron supplementation. - Refer for Outpatient colonoscopy - Pt consents to blood transfusion as necessary - Transfuse 1 unit pRBC today due to low Hgb and amputation tomorrow  AKI: Baseline Cr 0.6-0.8, was 0.78 on admission.  1.26 on 7/3 and 1.61 on 7/4.  On Vanc. - Continue to monitor with daily BMP - Increase fluids to NS 150cc/hr  HTN: BP on admission 114/72 takes amlodipine 10 mg daily, lisinopril 40 mg daily, and Toprol XL 25 mg daily. BP 99/65 this AM. - Continue BP regimen from home    Electrolyte Abnormality: hyponatremia noted on past labs and patient is asymptomatic. Labs on admit Na 130 L, Cl 94 L, Ca 8.8 L.  Chronic issue, this is near baseline - Increase fluids to NS 150cc/hr - f/u am CMP   Tobacco use: smokes 1 ppd, has been smoking for 50 years. Declines nicotine patch. - Nicotine patch per patient request  Constipation: Resolved.  Had large BM on 7/3 with no frank blood.  Nurse did not test BM.  - Decrease Miralax to QD  Hx alcohol use: Chart  indicates a history of four alcoholic drinks per week, patient says he has not consumed alcohol in 3-4 months. - will start CIWA, can discontinue during admission if scores are low  FEN/GI: Carb modified, NPO at midnight thrusday, maintenance fluids Prophylaxis: DVT Heparin 5,000 Units SubQ q8 hrs  Disposition: admit to med-surg  Subjective:  Patient complains of 10/10 pain in left leg and states that his pain medication does not improve the pain.  He denies any other complaints.   Objective: Temp:   [98.2 F (36.8 C)-99.2 F (37.3 C)] 98.2 F (36.8 C) (07/04 1316) Pulse Rate:  [61-69] 61 (07/04 1316) Resp:  [17-18] 17 (07/04 1316) BP: (99-121)/(65-74) 110/68 (07/04 1316) SpO2:  [99 %-100 %] 100 % (07/04 1316) Physical Exam: General: 57 yo male in NAD, lying in bed Cardiovascular: RRR, no m/r/g, distal pulse on left foot is very weak but present with cap refill ~4, foot is cool.   Right leg 2+ pulses and warm Respiratory: CTAB, normal effort, no wheezing Abdomen: soft, non-tender, + bowel sounds Extremities: open wound on left ankle, with discoloration up left calf, painful to touch, serosanguinous fluid on dressing  Laboratory: Recent Labs  Lab 10/02/17 1717 10/03/17 0608 10/03/17 2039 10/04/17 0922  WBC 15.8* 12.9*  --  11.6*  HGB 7.4* 7.0* 8.2* 7.8*  HCT 23.1* 21.8* 25.0* 24.2*  PLT 718* 662*  --  632*   Recent Labs  Lab 10/02/17 1717 10/03/17 0608 10/04/17 1052  NA 130* 130* 131*  K 4.3 4.1 4.2  CL 94* 97* 99  CO2 26 23 25   BUN 7 13 17   CREATININE 0.78 1.26* 1.61*  CALCIUM 8.8* 8.0* 8.1*  PROT  --  8.0 7.5  BILITOT  --  1.1 0.6  ALKPHOS  --  111 120  ALT  --  30 31  AST  --  24 25  GLUCOSE 184* 182* 244*    Imaging/Diagnostic Tests: Dg Ankle Complete Left  Result Date: 10/02/2017 CLINICAL DATA:  Swelling) none drainage from LEFT ankle, abscess, history hardware EXAM: LEFT ANKLE COMPLETE - 3+ VIEW COMPARISON:  01/21/2015 FINDINGS: Diffuse osseous demineralization. Single screw at distal tibia. Advanced degenerative changes of the ankle joint with irregular joint space narrowing and significant marginal spur formation. Posttraumatic deformities of the distal tibia and fibula. Plantar calcaneal spur. Chronic sclerosis at the calcaneus. No acute fracture, dislocation or bone destruction. Scattered soft tissue swelling. IMPRESSION: Extensive posttraumatic and degenerative changes of the LEFT ankle as above. No definite acute bony abnormalities. If there is  persistent clinical concern for osteomyelitis, recommend MR. Electronically Signed   By: Lavonia Dana M.D.   On: 10/02/2017 18:10     Rittberger, Bernita Raisin, DO 10/04/2017, 2:33 PM PGY-1, [facility] Intern pager: [phone], text pages welcome

## 2017-10-05 ENCOUNTER — Encounter (HOSPITAL_COMMUNITY): Payer: Self-pay

## 2017-10-05 ENCOUNTER — Inpatient Hospital Stay (HOSPITAL_COMMUNITY): Payer: Medicaid Other | Admitting: Anesthesiology

## 2017-10-05 ENCOUNTER — Encounter (HOSPITAL_COMMUNITY)
Admission: EM | Disposition: A | Discharge status: Rehabilitation Facility | Payer: Self-pay | Source: Home / Self Care | Attending: Family Medicine

## 2017-10-05 DIAGNOSIS — M86272 Subacute osteomyelitis, left ankle and foot: Secondary | ICD-10-CM

## 2017-10-05 HISTORY — PX: AMPUTATION: SHX166

## 2017-10-05 LAB — CBC
HCT: 28.3 % — ABNORMAL LOW (ref 39.0–52.0)
Hemoglobin: 9 g/dL — ABNORMAL LOW (ref 13.0–17.0)
MCH: 28.8 pg (ref 26.0–34.0)
MCHC: 31.8 g/dL (ref 30.0–36.0)
MCV: 90.7 fL (ref 78.0–100.0)
PLATELETS: 688 10*3/uL — AB (ref 150–400)
RBC: 3.12 MIL/uL — ABNORMAL LOW (ref 4.22–5.81)
RDW: 14.2 % (ref 11.5–15.5)
WBC: 9.8 10*3/uL (ref 4.0–10.5)

## 2017-10-05 LAB — COMPREHENSIVE METABOLIC PANEL
ALT: UNDETERMINED U/L (ref 0–44)
AST: 18 U/L (ref 15–41)
Albumin: 2.1 g/dL — ABNORMAL LOW (ref 3.5–5.0)
Alkaline Phosphatase: 87 U/L (ref 38–126)
Anion gap: 8 (ref 5–15)
BUN: 10 mg/dL (ref 6–20)
CALCIUM: 8.5 mg/dL — AB (ref 8.9–10.3)
CHLORIDE: 109 mmol/L (ref 98–111)
CO2: 21 mmol/L — AB (ref 22–32)
CREATININE: 1.12 mg/dL (ref 0.61–1.24)
GFR calc non Af Amer: >60 mL/min (ref >60)
GLUCOSE: 189 mg/dL — AB (ref 70–99)
Potassium: 5.3 mmol/L — ABNORMAL HIGH (ref 3.5–5.1)
SODIUM: 138 mmol/L (ref 135–145)
Total Bilirubin: UNDETERMINED mg/dL (ref 0.3–1.2)
Total Protein: 7.8 g/dL (ref 6.5–8.1)

## 2017-10-05 LAB — BPAM RBC
BLOOD PRODUCT EXPIRATION DATE: 201908062359
Blood Product Expiration Date: 201908052359
ISSUE DATE / TIME: 201907041523
ISSUE DATE / TIME: 201907031307
UNIT TYPE AND RH: 5100
Unit Type and Rh: 5100

## 2017-10-05 LAB — GLUCOSE, CAPILLARY
GLUCOSE-CAPILLARY: 210 mg/dL — AB (ref 70–99)
GLUCOSE-CAPILLARY: 181 mg/dL — AB (ref 70–99)
GLUCOSE-CAPILLARY: 240 mg/dL — AB (ref 70–99)
Glucose-Capillary: 202 mg/dL — ABNORMAL HIGH (ref 70–99)
Glucose-Capillary: 189 mg/dL — ABNORMAL HIGH (ref 70–99)
Glucose-Capillary: 288 mg/dL — ABNORMAL HIGH (ref 70–99)

## 2017-10-05 LAB — TYPE AND SCREEN
ABO/RH(D): O POS
ANTIBODY SCREEN: NEGATIVE
Unit division: 0
Unit division: 0

## 2017-10-05 SURGERY — AMPUTATION BELOW KNEE
Anesthesia: Monitor Anesthesia Care | Laterality: Left

## 2017-10-05 MED ORDER — HYDROMORPHONE HCL 1 MG/ML IJ SOLN
0.5000 mg | INTRAMUSCULAR | Status: DC | PRN
  Administered 2017-10-06 (×2): 1 mg via INTRAVENOUS
  Filled 2017-10-05 (×2): qty 1

## 2017-10-05 MED ORDER — PROPOFOL 10 MG/ML IV BOLUS
INTRAVENOUS | Status: AC
  Filled 2017-10-05: qty 20

## 2017-10-05 MED ORDER — SODIUM CHLORIDE 0.9 % IV SOLN
INTRAVENOUS | Status: DC
  Administered 2017-10-05: 14:00:00 via INTRAVENOUS

## 2017-10-05 MED ORDER — OXYCODONE HCL 5 MG PO TABS
10.0000 mg | ORAL_TABLET | ORAL | Status: DC | PRN
  Administered 2017-10-05 – 2017-10-07 (×8): 15 mg via ORAL
  Filled 2017-10-05 (×8): qty 3

## 2017-10-05 MED ORDER — MIDAZOLAM HCL 2 MG/2ML IJ SOLN
INTRAMUSCULAR | Status: AC
  Administered 2017-10-05: 2 mg via INTRAVENOUS
  Filled 2017-10-05: qty 2

## 2017-10-05 MED ORDER — PHENYLEPHRINE 40 MCG/ML (10ML) SYRINGE FOR IV PUSH (FOR BLOOD PRESSURE SUPPORT)
PREFILLED_SYRINGE | INTRAVENOUS | Status: AC
  Filled 2017-10-05: qty 10

## 2017-10-05 MED ORDER — PROMETHAZINE HCL 25 MG/ML IJ SOLN: 6.2500 mg | INTRAMUSCULAR | Status: DC | PRN

## 2017-10-05 MED ORDER — MIDAZOLAM HCL 2 MG/2ML IJ SOLN
2.0000 mg | Freq: Once | INTRAMUSCULAR | Status: AC
  Administered 2017-10-05: 2 mg via INTRAVENOUS

## 2017-10-05 MED ORDER — ONDANSETRON HCL 4 MG/2ML IJ SOLN
INTRAMUSCULAR | Status: AC
  Filled 2017-10-05: qty 2

## 2017-10-05 MED ORDER — VANCOMYCIN HCL 10 G IV SOLR
1250.0000 mg | INTRAVENOUS | Status: DC
  Administered 2017-10-05: 1250 mg via INTRAVENOUS
  Filled 2017-10-05: qty 1250

## 2017-10-05 MED ORDER — POLYETHYLENE GLYCOL 3350 17 G PO PACK: 17.0000 g | PACK | Freq: Every day | ORAL | Status: DC | PRN

## 2017-10-05 MED ORDER — ONDANSETRON HCL 4 MG/2ML IJ SOLN: 4.0000 mg | Freq: Four times a day (QID) | INTRAMUSCULAR | Status: DC | PRN

## 2017-10-05 MED ORDER — SUCCINYLCHOLINE CHLORIDE 200 MG/10ML IV SOSY
PREFILLED_SYRINGE | INTRAVENOUS | Status: AC
  Filled 2017-10-05: qty 10

## 2017-10-05 MED ORDER — METOCLOPRAMIDE HCL 5 MG PO TABS: 5.0000 mg | ORAL_TABLET | Freq: Three times a day (TID) | ORAL | Status: DC | PRN

## 2017-10-05 MED ORDER — PHENYLEPHRINE HCL 10 MG/ML IJ SOLN
INTRAMUSCULAR | Status: DC | PRN
  Administered 2017-10-05: 80 ug via INTRAVENOUS

## 2017-10-05 MED ORDER — FENTANYL CITRATE (PF) 100 MCG/2ML IJ SOLN: 25.0000 ug | INTRAMUSCULAR | Status: DC | PRN

## 2017-10-05 MED ORDER — LACTATED RINGERS IV SOLN
INTRAVENOUS | Status: DC
  Administered 2017-10-05: 10:00:00 via INTRAVENOUS

## 2017-10-05 MED ORDER — FENTANYL CITRATE (PF) 100 MCG/2ML IJ SOLN
INTRAMUSCULAR | Status: DC | PRN
  Administered 2017-10-05: 50 ug via INTRAVENOUS

## 2017-10-05 MED ORDER — ROCURONIUM BROMIDE 10 MG/ML (PF) SYRINGE
PREFILLED_SYRINGE | INTRAVENOUS | Status: AC
  Filled 2017-10-05: qty 10

## 2017-10-05 MED ORDER — CEFAZOLIN SODIUM-DEXTROSE 2-4 GM/100ML-% IV SOLN
2.0000 g | INTRAVENOUS | Status: AC
  Administered 2017-10-05: 2 g via INTRAVENOUS

## 2017-10-05 MED ORDER — MEPERIDINE HCL 50 MG/ML IJ SOLN: 6.2500 mg | INTRAMUSCULAR | Status: DC | PRN

## 2017-10-05 MED ORDER — GABAPENTIN 300 MG PO CAPS
300.0000 mg | ORAL_CAPSULE | Freq: Three times a day (TID) | ORAL | Status: DC
  Administered 2017-10-05 – 2017-10-09 (×12): 300 mg via ORAL
  Filled 2017-10-05 (×12): qty 1

## 2017-10-05 MED ORDER — ROPIVACAINE HCL 7.5 MG/ML IJ SOLN
INTRAMUSCULAR | Status: DC | PRN
  Administered 2017-10-05: 40 mL via PERINEURAL

## 2017-10-05 MED ORDER — FENTANYL CITRATE (PF) 250 MCG/5ML IJ SOLN
INTRAMUSCULAR | Status: AC
  Filled 2017-10-05: qty 5

## 2017-10-05 MED ORDER — CEFAZOLIN SODIUM-DEXTROSE 2-4 GM/100ML-% IV SOLN
INTRAVENOUS | Status: AC
  Filled 2017-10-05: qty 100

## 2017-10-05 MED ORDER — SUGAMMADEX SODIUM 200 MG/2ML IV SOLN
INTRAVENOUS | Status: AC
  Filled 2017-10-05: qty 2

## 2017-10-05 MED ORDER — FENTANYL CITRATE (PF) 100 MCG/2ML IJ SOLN
INTRAMUSCULAR | Status: AC
  Administered 2017-10-05: 100 ug via INTRAVENOUS
  Filled 2017-10-05: qty 2

## 2017-10-05 MED ORDER — METOCLOPRAMIDE HCL 5 MG/ML IJ SOLN: 5.0000 mg | Freq: Three times a day (TID) | INTRAMUSCULAR | Status: DC | PRN

## 2017-10-05 MED ORDER — METHOCARBAMOL 1000 MG/10ML IJ SOLN
500.0000 mg | Freq: Four times a day (QID) | INTRAVENOUS | Status: DC | PRN
  Filled 2017-10-05: qty 5

## 2017-10-05 MED ORDER — METHOCARBAMOL 500 MG PO TABS
500.0000 mg | ORAL_TABLET | Freq: Four times a day (QID) | ORAL | Status: DC | PRN
  Administered 2017-10-05 – 2017-10-09 (×7): 500 mg via ORAL
  Filled 2017-10-05 (×7): qty 1

## 2017-10-05 MED ORDER — PROPOFOL 10 MG/ML IV BOLUS
INTRAVENOUS | Status: DC | PRN
  Administered 2017-10-05: 40 mg via INTRAVENOUS

## 2017-10-05 MED ORDER — FENTANYL CITRATE (PF) 100 MCG/2ML IJ SOLN
100.0000 ug | Freq: Once | INTRAMUSCULAR | Status: AC
  Administered 2017-10-05: 100 ug via INTRAVENOUS

## 2017-10-05 MED ORDER — 0.9 % SODIUM CHLORIDE (POUR BTL) OPTIME
TOPICAL | Status: DC | PRN
  Administered 2017-10-05: 1000 mL

## 2017-10-05 MED ORDER — PROPOFOL 500 MG/50ML IV EMUL
INTRAVENOUS | Status: DC | PRN
  Administered 2017-10-05: 100 ug/kg/min via INTRAVENOUS

## 2017-10-05 MED ORDER — ONDANSETRON HCL 4 MG PO TABS: 4.0000 mg | ORAL_TABLET | Freq: Four times a day (QID) | ORAL | Status: DC | PRN

## 2017-10-05 MED ORDER — DOCUSATE SODIUM 100 MG PO CAPS
100.0000 mg | ORAL_CAPSULE | Freq: Two times a day (BID) | ORAL | Status: DC
Start: 2017-10-05 — End: 2017-10-09
  Administered 2017-10-05 – 2017-10-09 (×9): 100 mg via ORAL
  Filled 2017-10-05 (×9): qty 1

## 2017-10-05 MED ORDER — ONDANSETRON HCL 4 MG/2ML IJ SOLN
INTRAMUSCULAR | Status: DC | PRN
  Administered 2017-10-05: 4 mg via INTRAVENOUS

## 2017-10-05 MED ORDER — BISACODYL 10 MG RE SUPP: 10.0000 mg | Freq: Every day | RECTAL | Status: DC | PRN

## 2017-10-05 MED ORDER — ACETAMINOPHEN 325 MG PO TABS
325.0000 mg | ORAL_TABLET | Freq: Four times a day (QID) | ORAL | Status: DC | PRN
  Administered 2017-10-07: 650 mg via ORAL
  Filled 2017-10-05 (×2): qty 2

## 2017-10-05 MED ORDER — MAGNESIUM CITRATE PO SOLN
1.0000 | Freq: Once | ORAL | Status: AC | PRN
  Administered 2017-10-08: 1 via ORAL
  Filled 2017-10-05: qty 296

## 2017-10-05 MED ORDER — DEXAMETHASONE SODIUM PHOSPHATE 10 MG/ML IJ SOLN
INTRAMUSCULAR | Status: AC
  Filled 2017-10-05: qty 1

## 2017-10-05 MED ORDER — OXYCODONE HCL 5 MG PO TABS
5.0000 mg | ORAL_TABLET | ORAL | Status: DC | PRN
  Administered 2017-10-07 – 2017-10-09 (×8): 10 mg via ORAL
  Filled 2017-10-05 (×10): qty 2

## 2017-10-05 MED ORDER — MIDAZOLAM HCL 2 MG/2ML IJ SOLN
INTRAMUSCULAR | Status: AC
  Filled 2017-10-05: qty 2

## 2017-10-05 SURGICAL SUPPLY — 38 items
BENZOIN TINCTURE PRP APPL 2/3 (GAUZE/BANDAGES/DRESSINGS) ×12 IMPLANT
BLADE SAW RECIP 87.9 MT (BLADE) ×3 IMPLANT
BLADE SURG 21 STRL SS (BLADE) ×5 IMPLANT
BNDG COHESIVE 6X5 TAN STRL LF (GAUZE/BANDAGES/DRESSINGS) ×4 IMPLANT
BNDG GAUZE ELAST 4 BULKY (GAUZE/BANDAGES/DRESSINGS) ×2 IMPLANT
COVER SURGICAL LIGHT HANDLE (MISCELLANEOUS) ×3 IMPLANT
CUFF TOURNIQUET SINGLE 34IN LL (TOURNIQUET CUFF) IMPLANT
CUFF TOURNIQUET SINGLE 44IN (TOURNIQUET CUFF) IMPLANT
DRAPE INCISE IOBAN 66X45 STRL (DRAPES) ×4 IMPLANT
DRAPE U-SHAPE 47X51 STRL (DRAPES) ×3 IMPLANT
DRESSING PREVENA PLUS CUSTOM (GAUZE/BANDAGES/DRESSINGS) ×1 IMPLANT
DRSG PREVENA PLUS CUSTOM (GAUZE/BANDAGES/DRESSINGS) ×3
DRSG VAC ATS LRG SENSATRAC (GAUZE/BANDAGES/DRESSINGS) ×2 IMPLANT
DURAPREP 26ML APPLICATOR (WOUND CARE) ×3 IMPLANT
ELECT REM PT RETURN 9FT ADLT (ELECTROSURGICAL) ×3
ELECTRODE REM PT RTRN 9FT ADLT (ELECTROSURGICAL) ×1 IMPLANT
GLOVE BIOGEL PI IND STRL 9 (GLOVE) ×1 IMPLANT
GLOVE BIOGEL PI INDICATOR 9 (GLOVE) ×2
GLOVE SURG ORTHO 9.0 STRL STRW (GLOVE) ×3 IMPLANT
GOWN STRL REUS W/ TWL XL LVL3 (GOWN DISPOSABLE) ×2 IMPLANT
GOWN STRL REUS W/TWL XL LVL3 (GOWN DISPOSABLE) ×4
KIT BASIN OR (CUSTOM PROCEDURE TRAY) ×3 IMPLANT
KIT TURNOVER KIT B (KITS) ×3 IMPLANT
MANIFOLD NEPTUNE II (INSTRUMENTS) ×3 IMPLANT
NS IRRIG 1000ML POUR BTL (IV SOLUTION) ×3 IMPLANT
PACK ORTHO EXTREMITY (CUSTOM PROCEDURE TRAY) ×3 IMPLANT
PAD ARMBOARD 7.5X6 YLW CONV (MISCELLANEOUS) ×3 IMPLANT
SPONGE LAP 18X18 X RAY DECT (DISPOSABLE) ×2 IMPLANT
STAPLER VISISTAT 35W (STAPLE) ×2 IMPLANT
STOCKINETTE IMPERVIOUS LG (DRAPES) ×3 IMPLANT
SUT SILK 2 0 (SUTURE) ×2
SUT SILK 2-0 18XBRD TIE 12 (SUTURE) ×1 IMPLANT
SUT VIC AB 1 CTX 27 (SUTURE) IMPLANT
TOWEL OR 17X26 10 PK STRL BLUE (TOWEL DISPOSABLE) ×3 IMPLANT
TUBE CONNECTING 12'X1/4 (SUCTIONS) ×1
TUBE CONNECTING 12X1/4 (SUCTIONS) ×2 IMPLANT
WND VAC CANISTER 500ML (MISCELLANEOUS) ×2 IMPLANT
YANKAUER SUCT BULB TIP NO VENT (SUCTIONS) ×3 IMPLANT

## 2017-10-05 NOTE — Op Note (Signed)
   Date of Surgery: 10/05/2017  INDICATIONS: Mr. [name] is a 57 y.o.-year-old male who has abscess osteomyelitis left calcaneus with purulent drainage.  PREOPERATIVE DIAGNOSIS: Abscess osteomyelitis left hindfoot  POSTOPERATIVE DIAGNOSIS: Same.  PROCEDURE: Transtibial amputation Application of Prevena wound VAC  SURGEON: Sharol Given, M.D.  ANESTHESIA:  general  IV FLUIDS AND URINE: See anesthesia.  ESTIMATED BLOOD LOSS: 200 mL.  COMPLICATIONS: None.  DESCRIPTION OF PROCEDURE: The patient was brought to the operating room and underwent a general anesthetic. After adequate levels of anesthesia were obtained patient's lower extremity was prepped using DuraPrep draped into a sterile field. A timeout was called. The foot was draped out of the sterile field with impervious stockinette. A transverse incision was made 11 cm distal to the tibial tubercle. This curved proximally and a large posterior flap was created. The tibia was transected 1 cm proximal to the skin incision. The fibula was transected just proximal to the tibial incision. The tibia was beveled anteriorly. A large posterior flap was created. The sciatic nerve was pulled cut and allowed to retract. The vascular bundles were suture ligated with 2-0 silk. The deep and superficial fascial layers were closed using #1 Vicryl. The skin was closed using staples and 2-0 nylon. The wound was covered with a Prevena wound VAC. There was a good suction fit. A prosthetic shrinker was applied. Patient was extubated taken to the PACU in stable condition.   DISCHARGE PLANNING:  Antibiotic duration: 24 hours postoperatively  Weightbearing: Nonweightbearing on the left  Pain medication: High-dose opioid pathway ordered  Dressing care/ Wound VAC: Continue wound VAC for 1 week  Discharge to: Home or skilled nursing  Follow-up: In the office 1 week post operative.  Meridee Score, MD [place] 12:28 PM

## 2017-10-05 NOTE — Anesthesia Procedure Notes (Signed)
Anesthesia Regional Block: Adductor canal block   Pre-Anesthetic Checklist: ,, timeout performed, Correct Patient, Correct Site, Correct Laterality, Correct Procedure, Correct Position, site marked, Risks and benefits discussed,  Surgical consent,  Pre-op evaluation,  At surgeon's request and post-op pain management  Laterality: Left  Prep: chloraprep       Needles:  Injection technique: Single-shot  Needle Type: Stimiplex     Needle Length: 9cm  Needle Gauge: 21     Additional Needles:   Procedures:,,,, ultrasound used (permanent image in chart),,,,  Narrative:  Start time: 10/05/2017 11:00 AM End time: 10/05/2017 11:02 AM Injection made incrementally with aspirations every 5 mL.  Performed by: With CRNAs  Anesthesiologist: Nolon Nations, MD  Additional Notes: BP cuff, EKG monitors applied. Sedation begun. Artery and nerve location verified with U/S and anesthetic injected incrementally, slowly, and after negative aspirations under direct u/s guidance. Good fascial /perineural spread. Tolerated well.

## 2017-10-05 NOTE — Progress Notes (Signed)
Orthopedic Tech Progress Note Patient Details:  [name] 1960-11-29 [id number]  Patient ID: [name], male   DOB: [birth year], 57 y.o.   MRN: [id number]   [place] Pink 10/05/2017, 2:05 PMCalled [employer] for left stump shrinnker.

## 2017-10-05 NOTE — Anesthesia Preprocedure Evaluation (Signed)
Anesthesia Evaluation  Patient identified by MRN, date of birth, ID band Patient awake    Reviewed: Allergy & Precautions, NPO status , Patient's Chart, lab work & pertinent test results  Airway Mallampati: II  TM Distance: >3 FB Neck ROM: Full    Dental   Pulmonary neg pulmonary ROS, Current Smoker,    Pulmonary exam normal breath sounds clear to auscultation       Cardiovascular hypertension, + Peripheral Vascular Disease  Normal cardiovascular exam Rhythm:Regular Rate:Normal     Neuro/Psych negative psych ROS   GI/Hepatic negative GI ROS, Neg liver ROS,   Endo/Other  diabetes, Poorly Controlled, Type 2  Renal/GU negative Renal ROS     Musculoskeletal  (+) Arthritis ,   Abdominal   Peds  Hematology  (+) anemia ,   Anesthesia Other Findings   Reproductive/Obstetrics                             Anesthesia Physical Anesthesia Plan  ASA: III  Anesthesia Plan: Regional   Post-op Pain Management:    Induction: Intravenous  PONV Risk Score and Plan: 0 and Ondansetron, Treatment may vary due to age or medical condition and Propofol infusion  Airway Management Planned: Simple Face Mask  Additional Equipment:   Intra-op Plan:   Post-operative Plan:   Informed Consent: I have reviewed the patients History and Physical, chart, labs and discussed the procedure including the risks, benefits and alternatives for the proposed anesthesia with the patient or authorized representative who has indicated his/her understanding and acceptance.   Dental advisory given  Plan Discussed with: CRNA  Anesthesia Plan Comments:         Anesthesia Quick Evaluation

## 2017-10-05 NOTE — Transfer of Care (Signed)
Immediate Anesthesia Transfer of Care Note  Patient: [name]  Procedure(s) Performed: LEFT BELOW KNEE AMPUTATION (Left )  Patient Location: PACU  Anesthesia Type:MAC and Regional  Level of Consciousness: drowsy  Airway & Oxygen Therapy: Patient Spontanous Breathing and Patient connected to face mask oxygen  Post-op Assessment: Report given to RN and Post -op Vital signs reviewed and stable  Post vital signs: Reviewed and stable  Last Vitals:  Vitals Value Taken Time  BP 90/62 10/05/2017 12:31 PM  Temp    Pulse 52 10/05/2017 12:32 PM  Resp 18 10/05/2017 12:32 PM  SpO2 98 % 10/05/2017 12:32 PM  Vitals shown include unvalidated device data.  Last Pain:  Vitals:   10/05/17 0924  TempSrc:   PainSc: 7       Patients Stated Pain Goal: 5 (75/88/32 5498)  Complications: No apparent anesthesia complications

## 2017-10-05 NOTE — Progress Notes (Signed)
Pharmacy Antibiotic Note  [name] is a 57 y.o. male admitted on 10/02/2017 with wound infection of his left foot.   Pharmacy has been consulted for Vancomycin dosing for wound infection with concern for possible osteomyelitis (Xray inconclusive). He has a h/o surgery on this ankle multiple times in the past.   WBC is trending down. Afebrile.  SCr increasing rapidly (0.78>>1.26>>1.61) with normalized CrCl now down to 50 mL/min.  Plan for L-transtibial amputation  Plan: Reduce Vancomycin 1250 mg IV every 24 hours.  Continue Zosyn 3.375g IV every 8 hours- EI. Monitor clinical status, renal function, cultures, ssVT prn F/up post-operatively for antibiotic plan.   Height: 5\' 7"  (170.2 cm) Weight: 226 lb (102.5 kg) IBW/kg (Calculated) : 66.1  Temp (24hrs), Avg:98.2 F (36.8 C), Min:97.9 F (36.6 C), Max:98.5 F (36.9 C)  Recent Labs  Lab 10/02/17 1717 10/02/17 1718 10/03/17 0608 10/04/17 0922 10/04/17 1052  WBC 15.8*  --  12.9* 11.6*  --   CREATININE 0.78  --  1.26*  --  1.61*  LATICACIDVEN  --  1.7  --   --   --     Estimated Creatinine Clearance: 57.1 mL/min (A) (by C-G formula based on SCr of 1.61 mg/dL (H)).    No Known Allergies  Antimicrobials this admission: Zosyn x1 10/02/17 Vanco 7/2>>  Dose adjustments this admission:   Microbiology results: 7/2 BCx: sent  Thank you for allowing pharmacy to be a part of this patient's care.  Sloan Leiter, PharmD, BCPS, BCCCP Clinical Pharmacist Clinical phone 10/05/2017 until 3:30PM [phone] After hours, please call #[phone] 10/05/2017 9:18 AM

## 2017-10-05 NOTE — Progress Notes (Signed)
Family Medicine Teaching Service Daily Progress Note Intern Pager: [phone]  Patient name: [name] Medical record number: [id number] Date of birth: [birth year] Age: 57 y.o. Gender: male  Primary Care Provider: Vidal Schwalbe, MD Consultants: ortho surg Code Status: fll  [name]:  7/2 admission 7/5 (scheduled transtibial amputation)  Assessment and Plan: [name] is a 57 y.o. male presenting with left ankle wound and LE swelling. PMH is significant for T2DM, HTN, PVD, gout, and tobacco use.  L calcaneal Osteomyelitis: Has had some pain and swelling since ORIF of L ankle >12yr but worsened since 2 weeks,denies recent injury. Left ankle/foot are swollen, erythematous, tender to touch from toes to knee with serosanguinous and mucopurulent secretions from open wound at L medial ankle. MRI 7/3 reveals chronic complex osteomyelitis involving calcaneus and draining medially into large soft tissue abscess extending to open wound.  s/p 1x Zosyn given in the ED.  Afebrile, WBC 9.8. ABI on 7/3, L 0.73, R 1.01. -Ortho consulted, recs appreciated - f/u  blood Cx x 2 - both no growth x 2 days - Continue Zosyn (7/2- ) -cont vanc (7/2- ) per pharm - cont tylenol 650mg  PO q6 hrs -d/c toradol (AKI) - cont oxy 10mg  q6 hrs prn - Transtibial intubation today @ 1045 - assess pain control following surgery - daily CBC (wbc trending down)  T2DM: Takes Lantus daily, (has not been taking Novolog with meals) as well as metformin 2 g daily.  Last blood sugar checked at home was 261 a few days ago although most are ~400. CBG on admit 211.  Drinks A LOT of cool aid so may not need long acting with controlled diet inpatient.  A1C 11.4.  Received Lantus 4u on 7/4.   - Insulin sensitive sliding scale TID AC and QHS - Reassess Long-acting insulin 7/6 following procedure on 7/5   Anemia: denies having anemia before, however has been noted in [place]. Endorses  VERY dark stool.  Denies dizziness, lightheadedness, GI bleed, bright red blood per rectum.   Reports global weakness LEs > UEs. Has never had a colonoscopy.Hgb 8.2 s/p 1 unit pRBC on 7/3. Iron studies 7/2: Iron deficiency anemia, iron 35, TIBC 220, ferritin 936.  Patient had BM 7/3 without frank blood, nursing did not test.  Hgb 7.8 on 7/4. S/P 1 unit pRBC on 7/4, repeat HGB 8.9. - After infection is resolved, should begin iron supplementation. - Refer for Outpatient colonoscopy - Pt consents to blood transfusion as necessary   AKI: Baseline Cr 0.6-0.8, was 0.78 on admission.  1.26 on 7/3 and 1.61 on 7/4.  On Vanc. CMP ordered not completed yet. - Continue to monitor with daily CMP - Currently receiving NS 150cc/hr  HTN: BP on admission 114/72 takes amlodipine 10 mg daily, lisinopril 40 mg daily, and Toprol XL 25 mg daily. BP 131/64 this AM. - Continue BP regimen from home    Electrolyte Abnormality: hyponatremia noted on past labs and patient is asymptomatic. Labs on admit Na 130 L, Cl 94 L, Ca 8.8 L.  Chronic issue, this is near baseline.  CMP ordered, not yet completed. - Currently on NS 150cc/hr - f/u am CMP   Tobacco use: smokes 1 ppd, has been smoking for 50 years. Declines nicotine patch. - Nicotine patch per patient request  Constipation: Resolved.  Had large BM on 7/3 with no frank blood.  Nurse did not test BM.  - Decrease Miralax to QD  Hx alcohol use: Chart  indicates a history of four alcoholic drinks per week, patient says he has not consumed alcohol in 3-4 months. - will start CIWA, can discontinue during admission if scores are low  FEN/GI: Carb modified, NPO at midnight thrusday, maintenance fluids Prophylaxis: DVT Heparin 5,000 Units SubQ q8 hrs  Disposition: admit to med-surg  Subjective:  Patient complaining of 10/10 uncontrolled pain this AM.  Denies CP/SOB.   Objective: Temp:  [97 F (36.1 C)-98.5 F (36.9 C)] 97.5 F (36.4 C) (07/05 1344) Pulse  Rate:  [46-87] 51 (07/05 1344) Resp:  [12-29] 18 (07/05 1344) BP: (90-141)/(62-77) 133/70 (07/05 1344) SpO2:  [94 %-100 %] 100 % (07/05 1344)    Physical Exam:  General: 57 yo male in NAD, lying in bed Cardiovascular: RRR, no m/r/g, distal pulse on left foot is very weak but present with cap refill ~4, foot is cool.   Right leg 2+ pulses and warm Respiratory: CTAB, no wheezing, normal work of breathing Abdomen: soft, non-tender, + bowel sounds Extremities: wound covered with dressing, serosanguinous and mucopurulent drainage on dressing   Laboratory: Recent Labs  Lab 10/03/17 0608  10/04/17 0922 10/04/17 2048 10/05/17 1329  WBC 12.9*  --  11.6*  --  9.8  HGB 7.0*   < > 7.8* 8.9* 9.0*  HCT 21.8*   < > 24.2* 27.8* 28.3*  PLT 662*  --  632*  --  688*   < > = values in this interval not displayed.   Recent Labs  Lab 10/02/17 1717 10/03/17 0608 10/04/17 1052  NA 130* 130* 131*  K 4.3 4.1 4.2  CL 94* 97* 99  CO2 26 23 25   BUN 7 13 17   CREATININE 0.78 1.26* 1.61*  CALCIUM 8.8* 8.0* 8.1*  PROT  --  8.0 7.5  BILITOT  --  1.1 0.6  ALKPHOS  --  111 120  ALT  --  30 31  AST  --  24 25  GLUCOSE 184* 182* 244*    Imaging/Diagnostic Tests: Dg Ankle Complete Left  Result Date: 10/02/2017 CLINICAL DATA:  Swelling) none drainage from LEFT ankle, abscess, history hardware EXAM: LEFT ANKLE COMPLETE - 3+ VIEW COMPARISON:  01/21/2015 FINDINGS: Diffuse osseous demineralization. Single screw at distal tibia. Advanced degenerative changes of the ankle joint with irregular joint space narrowing and significant marginal spur formation. Posttraumatic deformities of the distal tibia and fibula. Plantar calcaneal spur. Chronic sclerosis at the calcaneus. No acute fracture, dislocation or bone destruction. Scattered soft tissue swelling. IMPRESSION: Extensive posttraumatic and degenerative changes of the LEFT ankle as above. No definite acute bony abnormalities. If there is persistent clinical  concern for osteomyelitis, recommend MR. Electronically Signed   By: Lavonia Dana M.D.   On: 10/02/2017 18:10     Rittberger, Bernita Raisin, DO 10/05/2017, 2:07 PM PGY-1, [facility] Intern pager: [phone], text pages welcome

## 2017-10-05 NOTE — Anesthesia Procedure Notes (Signed)
Anesthesia Regional Block: Popliteal block   Pre-Anesthetic Checklist: ,, timeout performed, Correct Patient, Correct Site, Correct Laterality, Correct Procedure, Correct Position, site marked, Risks and benefits discussed,  Surgical consent,  Pre-op evaluation,  At surgeon's request and post-op pain management  Laterality: Left  Prep: chloraprep       Needles:  Injection technique: Single-shot  Needle Type: Stimiplex     Needle Length: 10cm  Needle Gauge: 21     Additional Needles:   Procedures:,,,, ultrasound used (permanent image in chart),,,,  Motor weakness within 5 minutes.   Nerve Stimulator or Paresthesia:  Response: 0.5 mA,   Additional Responses:   Narrative:  Start time: 10/05/2017 11:03 AM End time: 10/05/2017 11:06 AM Injection made incrementally with aspirations every 5 mL.  Performed by: Personally  Anesthesiologist: Nolon Nations, MD  Additional Notes: Nerve located and needle positioned with direct ultrasound guidance. Good perineural spread. Patient tolerated well.

## 2017-10-05 NOTE — Interval H&P Note (Signed)
History and Physical Interval Note:  10/05/2017 6:42 AM  [name]  has presented today for surgery, with the diagnosis of Abscess, Osteomyelitis Left Ankle  The various methods of treatment have been discussed with the patient and family. After consideration of risks, benefits and other options for treatment, the patient has consented to  Procedure(s): LEFT BELOW KNEE AMPUTATION (Left) as a surgical intervention .  The patient's history has been reviewed, patient examined, no change in status, stable for surgery.  I have reviewed the patient's chart and labs.  Questions were answered to the patient's satisfaction.     [name]

## 2017-10-05 NOTE — Care Management Note (Signed)
Case Management Note  Patient Details  Name: [name] MRN: [id number] Date of Birth: 1961-01-19  Subjective/Objective:                    Action/Plan:  10-05-17 s/p Transtibial amputation Application of Prevena wound VAC   Await PT eval for recommendations Expected Discharge Date:  10/04/17               Expected Discharge Plan:     In-House Referral:     Discharge planning Services  CM Consult  Post Acute Care Choice:  Home Health, Durable Medical Equipment Choice offered to:     DME Arranged:    DME Agency:     HH Arranged:    [place] Agency:     Status of Service:  In process, will continue to follow  If discussed at Long Length of Stay Meetings, dates discussed:    Additional Comments:  Marilu Favre, RN 10/05/2017, 2:51 PM

## 2017-10-06 LAB — GLUCOSE, CAPILLARY
GLUCOSE-CAPILLARY: 201 mg/dL — AB (ref 70–99)
Glucose-Capillary: 164 mg/dL — ABNORMAL HIGH (ref 70–99)
Glucose-Capillary: 256 mg/dL — ABNORMAL HIGH (ref 70–99)
Glucose-Capillary: 187 mg/dL — ABNORMAL HIGH (ref 70–99)

## 2017-10-06 LAB — COMPREHENSIVE METABOLIC PANEL
ALT: 22 U/L (ref 0–44)
ANION GAP: 7 (ref 5–15)
AST: 26 U/L (ref 15–41)
Albumin: 2.4 g/dL — ABNORMAL LOW (ref 3.5–5.0)
Alkaline Phosphatase: 99 U/L (ref 38–126)
BILIRUBIN TOTAL: 0.6 mg/dL (ref 0.3–1.2)
BUN: 10 mg/dL (ref 6–20)
CHLORIDE: 101 mmol/L (ref 98–111)
CO2: 27 mmol/L (ref 22–32)
Calcium: 9 mg/dL (ref 8.9–10.3)
Creatinine, Ser: 1.09 mg/dL (ref 0.61–1.24)
GFR calc Af Amer: >60 mL/min (ref >60)
Glucose, Bld: 195 mg/dL — ABNORMAL HIGH (ref 70–99)
Potassium: 4.7 mmol/L (ref 3.5–5.1)
Sodium: 135 mmol/L (ref 135–145)
TOTAL PROTEIN: 9.1 g/dL — AB (ref 6.5–8.1)

## 2017-10-06 LAB — CBC
HCT: 29.5 % — ABNORMAL LOW (ref 39.0–52.0)
Hemoglobin: 9.3 g/dL — ABNORMAL LOW (ref 13.0–17.0)
MCH: 28.5 pg (ref 26.0–34.0)
MCHC: 31.5 g/dL (ref 30.0–36.0)
MCV: 90.5 fL (ref 78.0–100.0)
PLATELETS: 688 10*3/uL — AB (ref 150–400)
RBC: 3.26 MIL/uL — AB (ref 4.22–5.81)
RDW: 14.1 % (ref 11.5–15.5)
WBC: 8.4 10*3/uL (ref 4.0–10.5)

## 2017-10-06 MED ORDER — HYDROMORPHONE HCL 1 MG/ML IJ SOLN
0.5000 mg | INTRAMUSCULAR | Status: DC | PRN
  Administered 2017-10-06 (×2): 1 mg via INTRAVENOUS
  Filled 2017-10-06 (×2): qty 1

## 2017-10-06 MED ORDER — INSULIN ASPART 100 UNIT/ML ~~LOC~~ SOLN
0.0000 [IU] | Freq: Three times a day (TID) | SUBCUTANEOUS | Status: DC
  Administered 2017-10-06: 3 [IU] via SUBCUTANEOUS
  Administered 2017-10-06: 8 [IU] via SUBCUTANEOUS
  Administered 2017-10-07 (×2): 5 [IU] via SUBCUTANEOUS
  Administered 2017-10-07: 2 [IU] via SUBCUTANEOUS
  Administered 2017-10-08: 8 [IU] via SUBCUTANEOUS
  Administered 2017-10-08: 5 [IU] via SUBCUTANEOUS
  Administered 2017-10-08: 3 [IU] via SUBCUTANEOUS
  Administered 2017-10-09: 8 [IU] via SUBCUTANEOUS
  Administered 2017-10-09: 5 [IU] via SUBCUTANEOUS

## 2017-10-06 MED ORDER — HYDROMORPHONE HCL 1 MG/ML IJ SOLN
1.5000 mg | INTRAMUSCULAR | Status: DC | PRN
  Administered 2017-10-06 – 2017-10-07 (×4): 1.5 mg via INTRAVENOUS
  Filled 2017-10-06 (×5): qty 2

## 2017-10-06 NOTE — Progress Notes (Signed)
Subjective: 1 Day Post-Op Procedure(s) (LRB): LEFT BELOW KNEE AMPUTATION (Left) Patient reports pain as moderate.  Vitals stable.  H/H only a little down, but tolerating well.  Surgery was yesterday.  Objective: Vital signs in last 24 hours: Temp:  [97 F (36.1 C)-98.4 F (36.9 C)] 98.4 F (36.9 C) (07/06 0543) Pulse Rate:  [46-73] 73 (07/06 0543) Resp:  [12-29] 19 (07/06 0543) BP: (90-142)/(62-82) 142/77 (07/06 0543) SpO2:  [94 %-100 %] 95 % (07/06 0543)  Intake/Output from previous day: 07/05 0701 - 07/06 0700 In: 4457.7 [P.O.:2499; I.V.:1477.2; IV Piggyback:481.5] Out: 5145 [Urine:5095; Blood:50] Intake/Output this shift: Total I/O In: 220 [P.O.:220] Out: -   Recent Labs    10/03/17 2039 10/04/17 0922 10/04/17 2048 10/05/17 1329 10/06/17 0641  HGB 8.2* 7.8* 8.9* 9.0* 9.3*   Recent Labs    10/05/17 1329 10/06/17 0641  WBC 9.8 8.4  RBC 3.12* 3.26*  HCT 28.3* 29.5*  PLT 688* 688*   Recent Labs    10/04/17 1052 10/05/17 1329  NA 131* 138  K 4.2 5.3*  CL 99 109  CO2 25 21*  BUN 17 10  CREATININE 1.61* 1.12  GLUCOSE 244* 189*  CALCIUM 8.1* 8.5*   No results for input(s): LABPT, INR in the last 72 hours.  Incision: dressing C/D/I  Assessment/Plan: 1 Day Post-Op Procedure(s) (LRB): LEFT BELOW KNEE AMPUTATION (Left) Up with therapy    Mcarthur Rossetti 10/06/2017, 7:50 AM

## 2017-10-06 NOTE — Progress Notes (Addendum)
Family Medicine Teaching Service Daily Progress Note Intern Pager: [phone]  Patient name: [name] Medical record number: [id number] Date of birth: [birth year] Age: 57 y.o. Gender: male  Primary Care Provider: Vidal Schwalbe, MD Consultants: ortho surg Code Status: fll  [name]:  7/2 admission 7/5 (scheduled transtibial amputation)  Assessment and Plan: [name] is a 57 y.o. male presenting with left ankle wound and LE swelling. PMH is significant for T2DM, HTN, PVD, gout, and tobacco use.  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  Patient is in considerable pain on 7/6 following this procedure.  Pain managed with scheduled tylenol, oxycodone 5-10 mg Q4H PRN for moderate pain, 10-15 mg Q4H PRN for severe pain, and dilaudid IV 1 mg Q4H PRN for breakthrough pain.  Will increase frequency of dilaudid to Q2H PRN.   -Ortho consulted, recs appreciated - f/u  blood Cx x 2 - both no growth x 3 days - per ortho recs, discontinue vanc and zosyn 24 hours after surgery (around 1500) - cont tylenol 650mg  PO q6 hrs - continue current pain regimen, listed above - monitor pain closely - Miralax currently QD; monitor for constipation in setting of narcotic use and add senna if needed  T2DM, uncontrolled: Takes Lantus daily, (has not been taking Novolog with meals) as well as metformin 2 g daily.  A1C 11.4.  CBGs in upper 100s to upper 200s over the past 24 hours.  Received 8 units Novolog on 7/5. - change to moderate insulin sliding scale TID AC and QHS since sugar control will be helpful for healing  Anemia: Has received 2 units pRBCs during admission.  Hgb 9.3 on 7/6 - After infection is resolved, should begin iron supplementation; will wait until patient is out of immediate post op period to do this - Refer for Outpatient colonoscopy - Pt consents to blood transfusion as necessary  AKI, improved: Baseline Cr 0.6-0.8, was 0.78 on admission.   1.26 on 7/3 and 1.61 on 7/4, improved to 1.09 on 7/6.   - Continue to monitor with daily CMP - Currently receiving NS 150cc/hr  HTN: BP on admission 142/77 on 7/6, takes amlodipine 10 mg daily, lisinopril 40 mg daily, and Toprol XL 25 mg daily.  - Continue BP regimen from home   Electrolyte Abnormalities, resolved: hyponatremia noted on past labs and patient is asymptomatic. Labs on admit Na 130 L, Cl 94 L, Ca 8.8 L.  Chronic issue, this is near baseline.  Electrolytes wnl on 7/6 - Currently on NS 150cc/hr  Tobacco use: smokes 1 ppd, has been smoking for 50 years. Declines nicotine patch. - Nicotine patch per patient request  Constipation: Resolved.  Had large BM on 7/3 with no frank blood.  Nurse did not test BM.  - Continue Miralax to QD  FEN/GI: Carb modified, maintenance fluids Prophylaxis: SCD on R leg  Disposition: admit to med-surg  Subjective:  Patient says pain is uncontrolled at 10/10 in intensity.  He says he has been notifying his nurse and receiving all PRN medications.  Denies feeling constipated.  Objective: Temp:  [97 F (36.1 C)-98.3 F (36.8 C)] 98.3 F (36.8 C) (07/05 2137) Pulse Rate:  [46-67] 67 (07/05 2137) Resp:  [12-29] 19 (07/05 2137) BP: (90-141)/(62-82) 138/82 (07/05 2137) SpO2:  [94 %-100 %] 100 % (07/05 2137)    Physical Exam:  General: shifting in bed, appears in pain Cardiovascular: RRR, no MRG, Right leg 2+ pulses and warm Respiratory: CTAB, normal work  of breathing Abdomen: soft, mildly distended, + bowel sounds Extremities: L leg s/p BKA with shrinker in place; normal ROM  Laboratory: Recent Labs  Lab 10/03/17 0608  10/04/17 0922 10/04/17 2048 10/05/17 1329  WBC 12.9*  --  11.6*  --  9.8  HGB 7.0*   < > 7.8* 8.9* 9.0*  HCT 21.8*   < > 24.2* 27.8* 28.3*  PLT 662*  --  632*  --  688*   < > = values in this interval not displayed.   Recent Labs  Lab 10/03/17 0608 10/04/17 1052 10/05/17 1329  NA 130* 131* 138  K 4.1 4.2  5.3*  CL 97* 99 109  CO2 23 25 21*  BUN 13 17 10   CREATININE 1.26* 1.61* 1.12  CALCIUM 8.0* 8.1* 8.5*  PROT 8.0 7.5 7.8  BILITOT 1.1 0.6 QUANTITY NOT SUFFICIENT, UNABLE TO PERFORM TEST  ALKPHOS 111 120 87  ALT 30 31 QUANTITY NOT SUFFICIENT, UNABLE TO PERFORM TEST  AST 24 25 18   GLUCOSE 182* 244* 189*    Imaging/Diagnostic Tests: Dg Ankle Complete Left  Result Date: 10/02/2017 CLINICAL DATA:  Swelling) none drainage from LEFT ankle, abscess, history hardware EXAM: LEFT ANKLE COMPLETE - 3+ VIEW COMPARISON:  01/21/2015 FINDINGS: Diffuse osseous demineralization. Single screw at distal tibia. Advanced degenerative changes of the ankle joint with irregular joint space narrowing and significant marginal spur formation. Posttraumatic deformities of the distal tibia and fibula. Plantar calcaneal spur. Chronic sclerosis at the calcaneus. No acute fracture, dislocation or bone destruction. Scattered soft tissue swelling. IMPRESSION: Extensive posttraumatic and degenerative changes of the LEFT ankle as above. No definite acute bony abnormalities. If there is persistent clinical concern for osteomyelitis, recommend MR. Electronically Signed   By: Lavonia Dana M.D.   On: 10/02/2017 18:10     Kathrene Alu, MD 10/06/2017, 5:26 AM PGY-2, [place] Intern pager: [phone], text pages welcome

## 2017-10-06 NOTE — Progress Notes (Signed)
Inpatient Rehabilitation  Per PT request, patient was screened by Gunnar Fusi for appropriateness for an Inpatient Acute Rehab consult.  At this time we are recommending an Inpatient Rehab consult as well as OT orders.  Please order if you are agreeable.    Carmelia Roller., CCC/SLP Admission Coordinator  [place]  Cell [phone]

## 2017-10-06 NOTE — Evaluation (Signed)
Physical Therapy Evaluation Patient Details Name: [name] MRN: [id number] DOB: [birth year] Today's Date: 10/06/2017   History of Present Illness  Pt is a 57 y/o male s/p L transtibial amputation. PMH is significant for T2DM, HTN, PVD, gout, and tobacco use.  Clinical Impression  Pt presented supine in bed with HOB elevated, awake and willing to participate in therapy session. Prior to admission, pt reported that he used a standard walker for functional mobility and was independent with ADLs. Pt does have 24/7 supervision/assistance upon d/c. Pt currently requires min guard for safety with bed mobility and min A x2 for stability with transfers using RW. PT will continue to follow pt acutely to progress mobility as tolerated. Pt would continue to benefit from skilled physical therapy services at this time while admitted and after d/c to address the below listed limitations in order to improve overall safety and independence with functional mobility.     Follow Up Recommendations CIR    Equipment Recommendations  None recommended by PT;Other (comment)(defer to next venue of care)    Recommendations for Other Services Rehab consult     Precautions / Restrictions Precautions Precautions: Fall Precaution Comments: wound VAC Restrictions Weight Bearing Restrictions: Yes LLE Weight Bearing: Non weight bearing      Mobility  Bed Mobility Overal bed mobility: Needs Assistance Bed Mobility: Supine to Sit     Supine to sit: Min guard     General bed mobility comments: increased time and effort, min guard for safety  Transfers Overall transfer level: Needs assistance Equipment used: Rolling walker (2 wheeled) Transfers: Sit to/from [facility] Sit to Stand: Min assist;+2 safety/equipment;+2 physical assistance Stand pivot transfers: Min assist;+2 safety/equipment;+2 physical assistance       General transfer comment: increased time and effort, cueing for  safe hand placement, assist for stability with transition into standing from EOB, and with pivotal movements to recliner chair towards pt's L side  Ambulation/Gait                Stairs            Wheelchair Mobility    Modified Rankin (Stroke Patients Only)       Balance Overall balance assessment: Needs assistance   Sitting balance-Leahy Scale: Fair     Standing balance support: Bilateral upper extremity supported Standing balance-Leahy Scale: Poor Standing balance comment: reliant on bilateral UEs for balance                             Pertinent Vitals/Pain Pain Assessment: Faces Faces Pain Scale: Hurts little more Pain Location: L residual limb, phantom pain Pain Descriptors / Indicators: Sore;Grimacing;Guarding Pain Intervention(s): Monitored during session;Repositioned    Home Living Family/patient expects to be discharged to:: Private residence Living Arrangements: Children Available Help at Discharge: Family;Available 24 hours/day Type of Home: House Home Access: Stairs to enter Entrance Stairs-Rails: Psychiatric nurse of Steps: 3 Home Layout: One level Home Equipment: Walker - standard      Prior Function Level of Independence: Independent with assistive device(s)         Comments: ambulated with standard walker     Hand Dominance        Extremity/Trunk Assessment   Upper Extremity Assessment Upper Extremity Assessment: Overall WFL for tasks assessed    Lower Extremity Assessment Lower Extremity Assessment: LLE deficits/detail LLE Deficits / Details: pt with residual limb protector in place upon arrival; pt with good  AROM at knee to full extension    Cervical / Trunk Assessment Cervical / Trunk Assessment: Normal  Communication   Communication: No difficulties  Cognition Arousal/Alertness: Awake/alert Behavior During Therapy: WFL for tasks assessed/performed Overall Cognitive Status: Within  Functional Limits for tasks assessed                                        General Comments      Exercises     Assessment/Plan    PT Assessment Patient needs continued PT services  PT Problem List Decreased strength;Decreased range of motion;Decreased activity tolerance;Decreased balance;Decreased mobility;Decreased coordination;Decreased knowledge of precautions;Decreased knowledge of use of DME;Decreased safety awareness;Pain       PT Treatment Interventions DME instruction;Gait training;Stair training;Functional mobility training;Therapeutic exercise;Balance training;Therapeutic activities;Neuromuscular re-education;Patient/family education    PT Goals (Current goals can be found in the Care Plan section)  Acute Rehab PT Goals Patient Stated Goal: get a LE prosthesis PT Goal Formulation: With patient Time For Goal Achievement: 10/20/17 Potential to Achieve Goals: Good    Frequency Min 5X/week   Barriers to discharge        Co-evaluation               AM-PAC PT "6 Clicks" Daily Activity  Outcome Measure Difficulty turning over in bed (including adjusting bedclothes, sheets and blankets)?: A Little Difficulty moving from lying on back to sitting on the side of the bed? : A Little Difficulty sitting down on and standing up from a chair with arms (e.g., wheelchair, bedside commode, etc,.)?: Unable Help needed moving to and from a bed to chair (including a wheelchair)?: A Little Help needed walking in hospital room?: A Little Help needed climbing 3-5 steps with a railing? : Total 6 Click Score: 14    End of Session Equipment Utilized During Treatment: Gait belt Activity Tolerance: Patient tolerated treatment well Patient left: in chair;with call bell/phone within reach;with chair alarm set Nurse Communication: Mobility status PT Visit Diagnosis: Other abnormalities of gait and mobility (R26.89);Pain Pain - Right/Left: Left Pain - part of body:  Knee;Leg    Time: 0810-0828 PT Time Calculation (min) (ACUTE ONLY): 18 min   Charges:   PT Evaluation $PT Eval Moderate Complexity: 1 Mod     PT G Codes:        [place], PT, DPT [place] 10/06/2017, 11:53 AM

## 2017-10-07 DIAGNOSIS — Z899 Acquired absence of limb, unspecified: Secondary | ICD-10-CM

## 2017-10-07 LAB — CULTURE, BLOOD (ROUTINE X 2)
CULTURE: NO GROWTH
Culture: NO GROWTH

## 2017-10-07 LAB — GLUCOSE, CAPILLARY
GLUCOSE-CAPILLARY: 160 mg/dL — AB (ref 70–99)
GLUCOSE-CAPILLARY: 236 mg/dL — AB (ref 70–99)
Glucose-Capillary: 211 mg/dL — ABNORMAL HIGH (ref 70–99)
Glucose-Capillary: 226 mg/dL — ABNORMAL HIGH (ref 70–99)

## 2017-10-07 LAB — CBC
HCT: 30.6 % — ABNORMAL LOW (ref 39.0–52.0)
HEMOGLOBIN: 9.5 g/dL — AB (ref 13.0–17.0)
MCH: 28.3 pg (ref 26.0–34.0)
MCHC: 31 g/dL (ref 30.0–36.0)
MCV: 91.1 fL (ref 78.0–100.0)
Platelets: 629 10*3/uL — ABNORMAL HIGH (ref 150–400)
RBC: 3.36 MIL/uL — AB (ref 4.22–5.81)
RDW: 14 % (ref 11.5–15.5)
WBC: 6.9 10*3/uL (ref 4.0–10.5)

## 2017-10-07 LAB — COMPREHENSIVE METABOLIC PANEL
ALBUMIN: 2.3 g/dL — AB (ref 3.5–5.0)
ALT: 21 U/L (ref 0–44)
AST: 23 U/L (ref 15–41)
Alkaline Phosphatase: 92 U/L (ref 38–126)
Anion gap: 10 (ref 5–15)
BUN: 14 mg/dL (ref 6–20)
CALCIUM: 8.9 mg/dL (ref 8.9–10.3)
CHLORIDE: 101 mmol/L (ref 98–111)
CO2: 22 mmol/L (ref 22–32)
Creatinine, Ser: 0.98 mg/dL (ref 0.61–1.24)
GFR calc Af Amer: >60 mL/min (ref >60)
Glucose, Bld: 194 mg/dL — ABNORMAL HIGH (ref 70–99)
Potassium: 5.1 mmol/L (ref 3.5–5.1)
SODIUM: 133 mmol/L — AB (ref 135–145)
Total Bilirubin: 0.4 mg/dL (ref 0.3–1.2)
Total Protein: 8.9 g/dL — ABNORMAL HIGH (ref 6.5–8.1)

## 2017-10-07 MED ORDER — HYDROMORPHONE HCL 1 MG/ML IJ SOLN: 1.5000 mg | INTRAMUSCULAR | Status: DC | PRN

## 2017-10-07 MED ORDER — INSULIN GLARGINE 100 UNIT/ML ~~LOC~~ SOLN
4.0000 [IU] | Freq: Once | SUBCUTANEOUS | Status: AC
  Administered 2017-10-07: 4 [IU] via SUBCUTANEOUS
  Filled 2017-10-07: qty 0.04

## 2017-10-07 NOTE — Progress Notes (Signed)
Family Medicine Teaching Service Daily Progress Note Intern Pager: [phone]  Patient name: [name] Medical record number: [id number] Date of birth: [birth year] Age: 57 y.o. Gender: male  Primary Care Provider: Vidal Schwalbe, MD Consultants: ortho surg Code Status: fll  [name]:  7/2 admission 7/5 Left transtibial amputation  Assessment and Plan: [name] is a 57 y.o. male presenting with left ankle wound and LE swelling. PMH is significant for T2DM, HTN, PVD, gout, and tobacco use.  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  Patient continues to complain of pain, but states that the medications improve the pain.  Pain managed with scheduled tylenol, oxycodone 5-10 mg Q4H PRN for moderate pain, 10-15 mg Q4H PRN for severe pain, and dilaudid to 1.5mg  Q2H PRN, that was increased on 7/6 from 1mg  Q4H PRN.  Afebrile overnight. - Transition to PO pain meds as able - will decrease Oxycodone to 10mg  q4 prn pain and dilaudid to 1.5mg  Q4h prn -Ortho consulted, recs appreciated - f/u  blood Cx x 2 - both no growth x 4 days - per ortho recs, discontinued vanc and zosyn on 7/6 - cont tylenol 650mg  PO q6 hrs - continue current pain regimen, listed above - monitor pain closely - Miralax currently QD; monitor for constipation in setting of narcotic use and add senna if needed  T2DM, uncontrolled: Takes Lantus daily, (has not been taking Novolog with meals) as well as metformin 2 g daily.  A1C 11.4.  CBGs in upper 100s to upper 200s over the past 24 hours.  Received 11 units Novolog on 7/6. CBG this AM 194. - change to moderate insulin sliding scale TID AC and QHS since sugar control will be helpful for healing - add Lantus 4 units QD today and continue to monitor  Anemia: Has received 2 units pRBCs during admission.  Hgb 9.5 on 7/7 - After infection is resolved, should begin iron supplementation; will wait until patient is out of immediate post op  period to do this - Refer for Outpatient colonoscopy - Pt consents to blood transfusion as necessary  AKI, improved: Baseline Cr 0.6-0.8, was 0.78 on admission.  1.26 on 7/3 and 1.61 on 7/4, improved to 0.98 on 7/7.   - Continue to monitor with daily CMP - Currently receiving NS 150cc/hr  HTN: BP on admission 142/77 on 7/6, takes amlodipine 10 mg daily, lisinopril 40 mg daily, and Toprol XL 25 mg daily.  143/79 this AM. - Continue BP regimen from home   Electrolyte Abnormalities, resolved: hyponatremia noted on past labs and patient is asymptomatic. Labs on admit Na 130 L, Cl 94 L, Ca 8.8 L.  Chronic issue, this is near baseline.  Na 133 on 7/7, otherwise electrolytes WNL. - D/C NS 150cc/hr - recheck CMP in AM  Tobacco use: smokes 1 ppd, has been smoking for 50 years. Declines nicotine patch. - Nicotine patch per patient request  Constipation: Resolved.  Had large BM on 7/3 with no frank blood.  Nurse did not test BM.  - Continue Miralax to QD  FEN/GI: Carb modified, maintenance fluids Prophylaxis: SCD on R leg  Disposition: admit to med-surg  Subjective:  Patient states that pain is better controlled today.  States that pain medication improves his pain.  Endorses 10/10 pain prior to medication administration.  Objective: Temp:  [98 F (36.7 C)-99.3 F (37.4 C)] 99.3 F (37.4 C) (07/07 0430) Pulse Rate:  [65-74] 65 (07/07 0430) Resp:  [18] 18 (07/07  0430) BP: (137-160)/(79-97) 143/79 (07/07 0430) SpO2:  [98 %-99 %] 99 % (07/07 0430)   Physical Exam: General: 57 yo male, lying in bed, resting comfortably Cardio: RRR no m/r/g, Right leg 2+ pulses Respiratory: No respiratory distress, CTAB Abdomen: soft, mildly distended, positive bowel sounds Extremities: L leg s/p BKA with shrinker   Laboratory: Recent Labs  Lab 10/05/17 1329 10/06/17 0641 10/07/17 0650  WBC 9.8 8.4 6.9  HGB 9.0* 9.3* 9.5*  HCT 28.3* 29.5* 30.6*  PLT 688* 688* 629*   Recent Labs  Lab  10/04/17 1052 10/05/17 1329 10/06/17 0641  NA 131* 138 135  K 4.2 5.3* 4.7  CL 99 109 101  CO2 25 21* 27  BUN 17 10 10   CREATININE 1.61* 1.12 1.09  CALCIUM 8.1* 8.5* 9.0  PROT 7.5 7.8 9.1*  BILITOT 0.6 QUANTITY NOT SUFFICIENT, UNABLE TO PERFORM TEST 0.6  ALKPHOS 120 87 99  ALT 31 QUANTITY NOT SUFFICIENT, UNABLE TO PERFORM TEST 22  AST 25 18 26   GLUCOSE 244* 189* 195*    Imaging/Diagnostic Tests: Dg Ankle Complete Left  Result Date: 10/02/2017 CLINICAL DATA:  Swelling) none drainage from LEFT ankle, abscess, history hardware EXAM: LEFT ANKLE COMPLETE - 3+ VIEW COMPARISON:  01/21/2015 FINDINGS: Diffuse osseous demineralization. Single screw at distal tibia. Advanced degenerative changes of the ankle joint with irregular joint space narrowing and significant marginal spur formation. Posttraumatic deformities of the distal tibia and fibula. Plantar calcaneal spur. Chronic sclerosis at the calcaneus. No acute fracture, dislocation or bone destruction. Scattered soft tissue swelling. IMPRESSION: Extensive posttraumatic and degenerative changes of the LEFT ankle as above. No definite acute bony abnormalities. If there is persistent clinical concern for osteomyelitis, recommend MR. Electronically Signed   By: Lavonia Dana M.D.   On: 10/02/2017 18:10     Rittberger, Bernita Raisin, DO 10/07/2017, 8:09 AM PGY-1, [place] Intern pager: [phone], text pages welcome

## 2017-10-07 NOTE — Progress Notes (Signed)
Family Medicine Teaching Service Daily Progress Note Intern Pager: [phone]  Patient name: [name] Medical record number: [id number] Date of birth: 1961/03/25 Age: 57 y.o. Gender: male  Primary Care Provider: Vidal Schwalbe, MD Consultants: ortho surg Code Status: fll  [name]:  7/2 admission 7/5 Left transtibial amputation  Assessment and Plan: [name] is a 57 y.o. male presenting with left ankle wound and LE swelling. PMH is significant for T2DM, HTN, PVD, gout, and tobacco use.  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  Patient continues to complain of pain in LLE, rates in 9/10 at first, then states "maybe it's less than that."  Does not appear to be in acute distress.  Was due for a dose of pain medication.  Pain managed with scheduled tylenol, oxycodone 10 Q4H PRN for pain, and dilaudid to 1.5mg  Q4H PRN, that was decreased on 7/7.  Patient has not taken dilaudid since 7/7 at 1200.  Afebrile overnight. Patient states he would like something else for constipation. - Transition to PO pain meds as able - currently on Oxycodone to 10mg  q4 prn pain and will d/c dilaudid, continue to assess needed -Ortho consulted, recs appreciated - f/u  blood Cx x 2 - both no growth x 5 days - per ortho recs, discontinued vanc and zosyn on 7/6 - cont tylenol 650mg  PO q6 hrs - continue current pain regimen, listed above - monitor pain closely - Miralax currently QD; called nurse and informed to give mag citrate as needed, will continue to assess   T2DM, uncontrolled: Takes Lantus daily, (has not been taking Novolog with meals) as well as metformin 2 g daily.  A1C 11.4.  CBGs in upper 100s to upper 200s over the past 24 hours.  Received 12 units Novolog on 7/7 and 4 units Lantus. CBG this AM 245. - change to moderate insulin sliding scale TID AC and QHS since sugar control will be helpful for healing - Add Lantus 10 units today.   Anemia: Has  received 2 units pRBCs during admission.  Hgb 9.4 on 7/8  - After infection is resolved, should begin iron supplementation; will wait until patient is out of immediate post op period to do this - Refer for Outpatient colonoscopy - Pt consents to blood transfusion as necessary - continue to monitor CBC q48hrs  AKI, improved: Baseline Cr 0.6-0.8, was 0.78 on admission.  1.26 on 7/3 and 1.61 on 7/4, on 7/8 Cr 1.09.   - Continue to monitor with BMP q48hrs - Not on IVF  HTN: BP on admission 142/77 on 7/6, takes amlodipine 10 mg daily, lisinopril 40 mg daily, and Toprol XL 25 mg daily.  130/93 this AM. - Continue BP regimen from home   Electrolyte Abnormalities, resolved: hyponatremia noted on past labs and patient is asymptomatic. Labs on admit Na 130 L, Cl 94 L, Ca 8.8 L.  Chronic issue, this is near baseline.  Electrolytes WNL on 7/8. - Not on IVF - continue to monitor BMP q48hrs  Tobacco use: smokes 1 ppd, has been smoking for 50 years. Declines nicotine patch. - Nicotine patch per patient request  Constipation: Resolved.  Had large BM on 7/3 with no frank blood.  Nurse did not test BM. Patient complaining of constipation and would like more help with BM.  Mag Citrate in chart prn, called nurse and told her to dispense to patient if continues to complain of constipation. - Continue Miralax to QD - continue to monitor  FEN/GI: Carb modified, maintenance fluids Prophylaxis: SCD on R leg  Disposition: CIR  Subjective:  Patient states that he is in 9/10 pain currently and that it is time for his next dose of medication.  When prompted, patient notes pain "may be less than 9/10."  No CP/SOB.  Objective: Temp:  [98.2 F (36.8 C)-98.4 F (36.9 C)] 98.4 F (36.9 C) (07/08 0506) Pulse Rate:  [60-67] 60 (07/08 0945) Resp:  [16-18] 16 (07/08 0506) BP: (129-130)/(66-93) 130/82 (07/08 0945) SpO2:  [97 %-100 %] 99 % (07/08 0506)    Physical Exam: General: 57 yo male, lying in bed,  resting comfortably, in NAD Cardio: RRR no m/r/g, Right leg 2+ pulses and warm Respiratory: No respiratory distress, CTAB Abdomen: soft, mildly distended, positive bowel sounds Extremities: L leg s/p BKA with shrinker   Laboratory: Recent Labs  Lab 10/06/17 0641 10/07/17 0650 10/08/17 0537  WBC 8.4 6.9 6.8  HGB 9.3* 9.5* 9.4*  HCT 29.5* 30.6* 30.2*  PLT 688* 629* 653*   Recent Labs  Lab 10/06/17 0641 10/07/17 0650 10/08/17 0537 10/08/17 0922  NA 135 133* 135  --   K 4.7 5.1 5.0  --   CL 101 101 103  --   CO2 27 22 26   --   BUN 10 14 17   --   CREATININE 1.09 0.98 1.09 1.09  CALCIUM 9.0 8.9 8.9  --   PROT 9.1* 8.9* 8.0  --   BILITOT 0.6 0.4 0.2*  --   ALKPHOS 99 92 133*  --   ALT 22 21 18   --   AST 26 23 21   --   GLUCOSE 195* 194* 245*  --     Imaging/Diagnostic Tests: Dg Ankle Complete Left  Result Date: 10/02/2017 CLINICAL DATA:  Swelling) none drainage from LEFT ankle, abscess, history hardware EXAM: LEFT ANKLE COMPLETE - 3+ VIEW COMPARISON:  01/21/2015 FINDINGS: Diffuse osseous demineralization. Single screw at distal tibia. Advanced degenerative changes of the ankle joint with irregular joint space narrowing and significant marginal spur formation. Posttraumatic deformities of the distal tibia and fibula. Plantar calcaneal spur. Chronic sclerosis at the calcaneus. No acute fracture, dislocation or bone destruction. Scattered soft tissue swelling. IMPRESSION: Extensive posttraumatic and degenerative changes of the LEFT ankle as above. No definite acute bony abnormalities. If there is persistent clinical concern for osteomyelitis, recommend MR. Electronically Signed   By: Lavonia Dana M.D.   On: 10/02/2017 18:10     Rittberger, Bernita Raisin, DO 10/08/2017, 11:14 AM PGY-1, [place] Intern pager: [phone], text pages welcome

## 2017-10-07 NOTE — Progress Notes (Signed)
Physical Therapy Treatment Patient Details Name: [name] MRN: [id number] DOB: 07/26/1960 Today's Date: 10/07/2017    History of Present Illness Pt is a 57 y/o male s/p L transtibial amputation. PMH is significant for T2DM, HTN, PVD, gout, and tobacco use.    PT Comments    Mobility limited by pain this session. Pt stood bedside with RW +2 min assist. He was unable to tolerate transfers/ambulation or exercises LLE due to 10/10 pain. Pt returned to supine at end of session.   Follow Up Recommendations  CIR     Equipment Recommendations  None recommended by PT;Other (comment)(defer to next venue)    Recommendations for Other Services Rehab consult     Precautions / Restrictions Precautions Precautions: Fall Precaution Comments: wound VAC Restrictions LLE Weight Bearing: Non weight bearing    Mobility  Bed Mobility Overal bed mobility: Needs Assistance Bed Mobility: Supine to Sit;Sit to Supine     Supine to sit: Min guard;HOB elevated     General bed mobility comments: increased time and effort, min guard for safety, +rail  Transfers Overall transfer level: Needs assistance Equipment used: Rolling walker (2 wheeled) Transfers: Sit to/from Stand Sit to Stand: +2 physical assistance;Min assist         General transfer comment: Static stand with RW x 30 seconds. Pt required return to EOB due to 10/10 pain. Unable to progress transfers or ambulation.  Ambulation/Gait             General Gait Details: unable due to pain   Stairs             Wheelchair Mobility    Modified Rankin (Stroke Patients Only)       Balance                                            Cognition Arousal/Alertness: Awake/alert Behavior During Therapy: WFL for tasks assessed/performed Overall Cognitive Status: Within Functional Limits for tasks assessed                                 General Comments: mildly agitated.  Perseverating on pain. Reports he slept in the recliner last night and just got back in bed.       Exercises      General Comments        Pertinent Vitals/Pain Pain Assessment: 0-10 Pain Score: 10-Worst pain ever Pain Location: L residual limb Pain Descriptors / Indicators: Moaning;Grimacing;Guarding;Throbbing;Sore Pain Intervention(s): Limited activity within patient's tolerance;Patient requesting pain meds-RN notified    Home Living                      Prior Function            PT Goals (current goals can now be found in the care plan section) Acute Rehab PT Goals Patient Stated Goal: get a LE prosthesis PT Goal Formulation: With patient Time For Goal Achievement: 10/20/17 Potential to Achieve Goals: Good Progress towards PT goals: Progressing toward goals    Frequency    Min 5X/week      PT Plan Current plan remains appropriate    Co-evaluation              AM-PAC PT "6 Clicks" Daily Activity  Outcome Measure  Difficulty turning over in bed (including  adjusting bedclothes, sheets and blankets)?: A Little Difficulty moving from lying on back to sitting on the side of the bed? : A Little Difficulty sitting down on and standing up from a chair with arms (e.g., wheelchair, bedside commode, etc,.)?: Unable Help needed moving to and from a bed to chair (including a wheelchair)?: A Little Help needed walking in hospital room?: A Little Help needed climbing 3-5 steps with a railing? : Total 6 Click Score: 14    End of Session Equipment Utilized During Treatment: Gait belt Activity Tolerance: Patient limited by pain Patient left: in bed;with call bell/phone within reach Nurse Communication: Patient requests pain meds PT Visit Diagnosis: Other abnormalities of gait and mobility (R26.89);Pain Pain - Right/Left: Left Pain - part of body: Leg     Time: 8453-6468 PT Time Calculation (min) (ACUTE ONLY): 12 min  Charges:  $Therapeutic Activity:  8-22 mins                    G Codes:       [name], PT  Office # [phone] Pager [phone]    [name] 10/07/2017, 12:43 PM

## 2017-10-08 ENCOUNTER — Encounter (HOSPITAL_COMMUNITY): Payer: Self-pay | Admitting: Orthopedic Surgery

## 2017-10-08 DIAGNOSIS — Z899 Acquired absence of limb, unspecified: Secondary | ICD-10-CM

## 2017-10-08 DIAGNOSIS — G8918 Other acute postprocedural pain: Secondary | ICD-10-CM

## 2017-10-08 DIAGNOSIS — D62 Acute posthemorrhagic anemia: Secondary | ICD-10-CM

## 2017-10-08 DIAGNOSIS — I10 Essential (primary) hypertension: Secondary | ICD-10-CM

## 2017-10-08 DIAGNOSIS — Z720 Tobacco use: Secondary | ICD-10-CM

## 2017-10-08 DIAGNOSIS — S88112A Complete traumatic amputation at level between knee and ankle, left lower leg, initial encounter: Secondary | ICD-10-CM

## 2017-10-08 LAB — COMPREHENSIVE METABOLIC PANEL
ALBUMIN: 2.3 g/dL — AB (ref 3.5–5.0)
ALT: 18 U/L (ref 0–44)
AST: 21 U/L (ref 15–41)
Alkaline Phosphatase: 133 U/L — ABNORMAL HIGH (ref 38–126)
Anion gap: 6 (ref 5–15)
BILIRUBIN TOTAL: 0.2 mg/dL — AB (ref 0.3–1.2)
BUN: 17 mg/dL (ref 6–20)
CO2: 26 mmol/L (ref 22–32)
Calcium: 8.9 mg/dL (ref 8.9–10.3)
Chloride: 103 mmol/L (ref 98–111)
Creatinine, Ser: 1.09 mg/dL (ref 0.61–1.24)
GFR calc non Af Amer: >60 mL/min (ref >60)
GLUCOSE: 245 mg/dL — AB (ref 70–99)
POTASSIUM: 5 mmol/L (ref 3.5–5.1)
Sodium: 135 mmol/L (ref 135–145)
TOTAL PROTEIN: 8 g/dL (ref 6.5–8.1)

## 2017-10-08 LAB — GLUCOSE, CAPILLARY
GLUCOSE-CAPILLARY: 253 mg/dL — AB (ref 70–99)
GLUCOSE-CAPILLARY: 189 mg/dL — AB (ref 70–99)
Glucose-Capillary: 232 mg/dL — ABNORMAL HIGH (ref 70–99)
Glucose-Capillary: 262 mg/dL — ABNORMAL HIGH (ref 70–99)

## 2017-10-08 LAB — CBC
HEMATOCRIT: 30.2 % — AB (ref 39.0–52.0)
HEMOGLOBIN: 9.4 g/dL — AB (ref 13.0–17.0)
MCH: 28.2 pg (ref 26.0–34.0)
MCHC: 31.1 g/dL (ref 30.0–36.0)
MCV: 90.7 fL (ref 78.0–100.0)
Platelets: 653 10*3/uL — ABNORMAL HIGH (ref 150–400)
RBC: 3.33 MIL/uL — AB (ref 4.22–5.81)
RDW: 13.8 % (ref 11.5–15.5)
WBC: 6.8 10*3/uL (ref 4.0–10.5)

## 2017-10-08 LAB — CREATININE, SERUM: CREATININE: 1.09 mg/dL (ref 0.61–1.24)

## 2017-10-08 MED ORDER — INSULIN GLARGINE 100 UNIT/ML ~~LOC~~ SOLN
10.0000 [IU] | Freq: Every day | SUBCUTANEOUS | Status: DC
  Administered 2017-10-08 – 2017-10-09 (×2): 10 [IU] via SUBCUTANEOUS
  Filled 2017-10-08 (×2): qty 0.1

## 2017-10-08 NOTE — Progress Notes (Signed)
Inpatient Rehabilitation-Admissions Coordinator    Met with patient and son at the bedside to discuss team's recommendation for inpatient rehabilitation. Shared booklets, expectations while in CIR, expected length of stay, and anticipated functional level at DC. Pt's son and daughter in law would be able to provide needed assist at DC after CIR. Pt appears to be limited by pain and will need to tolerate increased time with therapy prior to admit to CIR. Plan to follow for timing of medical readiness, improved activity tolerance, and IP Rehab bed availability. Call if questions.   Jhonnie Garner, OTR/L  Rehab Admissions Coordinator  [phone] 10/08/2017 5:30 PM

## 2017-10-08 NOTE — PMR Pre-admission (Signed)
PMR Admission Coordinator Pre-Admission Assessment  Patient: [name] is an 57 y.o., male MRN: [id number] DOB: 09/30/1960 Height: 5\' 7"  (170.2 cm) Weight: 102.5 kg (226 lb)              Insurance Information HMO:     PPO:      PCP:      IPA:      80/20:      OTHER: Yes  PRIMARY: [place] Access      Policy#: [id number]      Subscriber: Patient CM Name:       Phone#:      Fax#:  Pre-Cert#:       Employer:  Benefits: Eligible verified on 10/08/17 Phone #: via automated system [phone]    Coverage Code: MADCY Eff. Date: checked on 10/08/17     Deduct: NA      Out of Pocket Max: NA      Life Max: NA CIR: Covered per Medicaid Guidelines      SNF:  Outpatient:      Co-Pay:  Home Health:       Co-Pay:  DME:      Co-Pay:  Providers:   Medicaid Application Date:       Case Manager:  Disability Application Date:       Case Worker:   Emergency Contact Information Contact Information    Name Relation Home Work Mobile   [place], [place] Son   [phone]   [place], [place] Daughter   [phone]     Current Medical History  Patient Admitting Diagnosis: Left BKA  History of Present Illness:  [name] is a 57 year old right-handed male with history of hypertension, uncontrolled diabetes mellitus, tobacco abuse, left ankle fracture with ORIF 1 year ago, peripheral vascular disease with revascularization procedures.  Per chart review and patient, lives with son and daughter in [place].  Independent with standard walker prior to admission.  One level home 3 steps to entry. Per son and pt, son does not work during the day and can be of assistance 24/7.  No other local family.  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  Underwent left transtibial amputation 10/05/2017 per [name] and wound VAC  applied.  Hospital course pain management.  Acute blood loss anemia 9.4 and monitored.  Hemoglobin A1c 11.4 with insulin therapy as directed.  Physical and occupational therapy evaluations completed with recommendations of physical medicine rehab consult.  Patient is to be admitted for a comprehensive rehab program on 10/09/17.         Past Medical History  Past Medical History:  Diagnosis Date  . Diabetes mellitus without complication ([place])   . Gout   . Hyperlipidemia   . Hypertension   . Insomnia     Family History  family history includes Diabetes in his mother.  Prior Rehab/Hospitalizations:  Has the patient had major surgery during 100 days prior to admission? No  Current Medications   Current Facility-Administered Medications:  .  0.9 %  sodium chloride infusion, , Intravenous, Continuous, [name], MD, Stopped at 10/05/17 1353 .  acetaminophen (TYLENOL) tablet 325-650 mg, 325-650 mg, Oral, Q6H PRN, [name], MD, 650 mg at 10/07/17 1515 .  acetaminophen (TYLENOL) tablet 650 mg, 650 mg, Oral, Q6H, [name], MD, 650 mg at 10/09/17 1222 .  amLODipine (NORVASC)  tablet 10 mg, 10 mg, Oral, Daily, [name], MD, 10 mg at 10/09/17 1056 .  aspirin EC tablet 81 mg, 81 mg, Oral, Daily, [name], MD, 81 mg at 10/09/17 1056 .  bisacodyl (DULCOLAX) suppository 10 mg, 10 mg, Rectal, Daily PRN, [name], MD .  docusate sodium (COLACE) capsule 100 mg, 100 mg, Oral, BID, [name], MD, 100 mg at 10/09/17 1056 .  gabapentin (NEURONTIN) capsule 300 mg, 300 mg, Oral, TID, [name], MD, 300 mg at 10/09/17 1056 .  insulin aspart (novoLOG) injection 0-15 Units, 0-15 Units, Subcutaneous, TID WC, Winfrey, Alcario Drought, MD, 8 Units at 10/09/17 1221 .  [START ON 10/10/2017] insulin glargine (LANTUS) injection 14 Units, 14 Units, Subcutaneous, Daily, Riccio, Angela C, DO .  lactated ringers infusion, , Intravenous, Continuous, [name], MD, Last Rate: 10 mL/hr  at 10/05/17 1019 .  Melatonin TABS 4.5 mg, 4.5 mg, Oral, QHS PRN, [name], MD, 4.5 mg at 10/07/17 2348 .  methocarbamol (ROBAXIN) tablet 500 mg, 500 mg, Oral, Q6H PRN, 500 mg at 10/09/17 0205 **OR** methocarbamol (ROBAXIN) 500 mg in dextrose 5 % 50 mL IVPB, 500 mg, Intravenous, Q6H PRN, [name], MD .  metoCLOPramide (REGLAN) tablet 5-10 mg, 5-10 mg, Oral, Q8H PRN **OR** metoCLOPramide (REGLAN) injection 5-10 mg, 5-10 mg, Intravenous, Q8H PRN, [name], MD .  metoprolol succinate (TOPROL-XL) 24 hr tablet 25 mg, 25 mg, Oral, Daily, [name], MD, 25 mg at 10/09/17 1057 .  nutrition supplement (JUVEN) (JUVEN) powder packet 1 packet, 1 packet, Oral, BID BM, [name], MD, 1 packet at 10/09/17 1056 .  ondansetron (ZOFRAN) tablet 4 mg, 4 mg, Oral, Q6H PRN **OR** ondansetron (ZOFRAN) injection 4 mg, 4 mg, Intravenous, Q6H PRN, [name], MD .  oxyCODONE (Oxy IR/ROXICODONE) immediate release tablet 5-10 mg, 5-10 mg, Oral, Q4H PRN, [name], MD, 10 mg at 10/09/17 9622 .  pantoprazole (PROTONIX) EC tablet 40 mg, 40 mg, Oral, Daily, [name], MD, 40 mg at 10/09/17 1056 .  polyethylene glycol (MIRALAX / GLYCOLAX) packet 17 g, 17 g, Oral, Daily, [name], MD, 17 g at 10/09/17 1056 .  polyethylene glycol (MIRALAX / GLYCOLAX) packet 17 g, 17 g, Oral, Daily PRN, [name], MD  Patients Current Diet:  Diet Order           Diet Carb Modified Fluid consistency: Thin; Room service appropriate? Yes  Diet effective now          Precautions / Restrictions Precautions Precautions: Fall Precaution Comments: wound VAC Other Brace/Splint: L leg limb guard Restrictions Weight Bearing Restrictions: Yes LLE Weight Bearing: Non weight bearing   Has the patient had 2 or more falls or a fall with injury in the past year?No  Prior Activity Level Limited Community (1-2x/wk): family would take him to run errands  Development worker, international aid / [place] Devices/Equipment: CBG Meter, Environmental consultant (specify type), Cane (specify quad or straight) Home Equipment: Walker - standard  Prior Device Use: Indicate devices/aids used by the patient prior to current illness, exacerbation or injury? Walker  Prior Functional Level Prior Function Level of Independence: Independent with assistive device(s) Comments: ambulated with standard walker due to pain  Self Care: Did the patient need help bathing, dressing, using the toilet or eating?  Independent  Indoor Mobility: Did the patient need assistance with walking from room to room (with or without device)? Independent  Stairs: Did the patient need assistance with internal or external stairs (with or without device)? Independent  Functional Cognition: Did the patient need help planning regular tasks such as shopping or remembering to take medications? Independent  Current Functional Level Cognition  Overall Cognitive Status: Within Functional Limits for tasks assessed Orientation Level: Oriented X4 General Comments: Pt pleasant throughout session.     Extremity Assessment (includes Sensation/Coordination)  Upper Extremity Assessment: Overall WFL for tasks assessed  Lower Extremity Assessment: LLE deficits/detail LLE Deficits / Details: Assisted pt to don limb protector for transfer. He is s/p L transtibial amputation.     ADLs  Overall ADL's : Needs assistance/impaired Eating/Feeding: Set up, Sitting Grooming: Set up, Oral care, Wash/dry face, Sitting Upper Body Bathing: Set up, Sitting Lower Body Bathing: Minimal assistance, Sitting/lateral leans Upper Body Dressing : Set up, Sitting Lower Body Dressing: Sitting/lateral leans, Moderate assistance Lower Body Dressing Details (indicate cue type and reason): including limb protector Toilet Transfer: Supervision/safety, Anterior/posterior Toilet Transfer Details (indicate cue type and reason): Pt with significant pain and deferred  attempted stand-pivot today. Able to complete anterior-posterior transfer with supervision.  Toileting- Clothing Manipulation and Hygiene: Moderate assistance, Sitting/lateral lean General ADL Comments: Pt able to sit at EOB for grooming tasks prior to anterior-posterior transfer to recliner chair. Assisted to don limb protector and pt verbalizes understanding of its benefits.     Mobility  Overal bed mobility: Needs Assistance Bed Mobility: Sit to Supine Supine to sit: HOB elevated, Supervision Sit to supine: Supervision General bed mobility comments: supervision for safety as pt used momentum and gravity to help lower trunk and lift legs back into bed.     Transfers  Overall transfer level: Needs assistance Equipment used: Rolling walker (2 wheeled) Transfers: Sit to/from Stand, Stand Pivot Transfers Sit to Stand: +2 physical assistance, Min assist Stand pivot transfers: +2 physical assistance, Min assist Anterior-Posterior transfers: Supervision General transfer comment: Two person min assist with RW, verbal cues for safe hand placement, pt needed stability at trunk and RW during movement, and he was only able to pivot on his right foot to the right side.  He was unable to hop and tried to sit prematurely before he was turned all the way around to the bed.  Uncontrolled descent to sit down.     Ambulation / Gait / Stairs / Wheelchair Mobility  Ambulation/Gait General Gait Details: unable at this time.     Posture / Balance Dynamic Sitting Balance Sitting balance - Comments: Some posterior LOB noted at EOB but able to self-correct.  Balance Overall balance assessment: Needs assistance Sitting-balance support: Feet supported, Bilateral upper extremity supported, No upper extremity supported Sitting balance-Leahy Scale: Fair Sitting balance - Comments: Some posterior LOB noted at EOB but able to self-correct.  Standing balance support: Bilateral upper extremity supported Standing  balance-Leahy Scale: Poor Standing balance comment: reliant on bilateral UEs for balance    Special needs/care consideration BiPAP/CPAP: No CPM: No Continuous Drip IV: No Dialysis: No        Days: NA Life Vest: No Oxygen: No Special Bed: No Trach Size: No Wound Vac (area): Yes to surgical incision area on LLE       Skin: LLE incision area                              Location Bowel mgmt:: Last BM 10/08/17 Bladder mgmt: continent  Diabetic mgmt: yes, injections per pt report  Previous Home Environment Living Arrangements: Children Available Help at Discharge: Family, Available 24 hours/day Type of Home: House Home Layout: One level Home Access: Stairs to enter Entrance Stairs-Rails: Right, Left Entrance Stairs-Number of Steps: 3 Bathroom Shower/Tub: Chiropodist: [place]s: No  Discharge Living Setting Plans for Discharge Living Setting: Patient's home, Lives with (comment)(lives in his son's home with son and dtr-in-law) Type of Home at Discharge: House Discharge Home Layout: One level Discharge Home Access: Stairs to enter Entrance Stairs-Rails: Can reach both Entrance Stairs-Number of Steps: 3 Discharge Bathroom Shower/Tub: Tub/shower unit Discharge Bathroom Toilet: Standard Discharge Bathroom Accessibility: Yes How Accessible: Accessible via walker, Other (comment)(possibly walker) Does the patient have any problems obtaining your medications?: No  Social/Family/Support Systems Patient Roles: Parent(pts son and daugther in law would take care of him) Contact Information: son: Rhyatt Brooke [place]): [phone] Anticipated Caregiver: son and daugther in law Anticipated Caregiver's Contact Information: see above Ability/Limitations of Caregiver: son can be there 24/7; however family would like input on getting him new handicapped apartment with live in roommate.  Caregiver Availability: 24/7 Discharge Plan Discussed with Primary  Caregiver: Yes Is Caregiver In Agreement with Plan?: Yes Does Caregiver/Family have Issues with Lodging/Transportation while Pt is in Rehab?: No   Goals/Additional Needs Patient/Family Goal for Rehab: PT/OT: Mod I/Supervision; SLP: NA Expected length of stay: 4-8 days Cultural Considerations: NA Dietary Needs: Carb modified diet (1600-2000 cal); thin liquids;  Equipment Needs: TBD Special Service Needs: NA Additional Information: NA Pt/Family Agrees to Admission and willing to participate: Yes Program Orientation Provided & Reviewed with Pt/Caregiver Including Roles  & Responsibilities: Yes(reviewed with pt, son, and daugther in [place]) Additional Information Needs: TBD  Barriers to Discharge: Home environment access/layout, Weight bearing restrictions  Barriers to Discharge Comments: new amputation; 3 steps to enter home   Decrease burden of Care through IP rehab admission: NA   Possible need for SNF placement upon discharge: Not anticipated    Patient Condition: This patient's medical and functional status has changed since the consult dated: 10/08/17 in which the Rehabilitation Physician determined and documented that the patient's condition is appropriate for intensive rehabilitative care in an inpatient rehabilitation facility. Medical changes are: improved activity tolerance based off of report form mobility aide (pt able to take hop steps) as well as based off of improved pain control per pt report.  Functional changes are: Min Ax2 for tranfers. After evaluating the patient today and speaking with the Rehabilitation physician and acute team, the patient remains appropriate for inpatient rehab. Will admit to inpatient rehab today.  Preadmission Screen Completed By:  Jhonnie Garner, 10/09/2017 12:25 PM ______________________________________________________________________   Discussed status with Dr. Naaman Plummer on 10/09/17 at 12:25PM and received telephone approval for admission today.  Admission  Coordinator:  Jhonnie Garner, time 12:25PM/Date 10/09/17

## 2017-10-08 NOTE — Evaluation (Signed)
Occupational Therapy Evaluation Patient Details Name: [name] MRN: [id number] DOB: [birth year] Today's Date: 10/08/2017    History of Present Illness Pt is a 57 y/o male s/p L transtibial amputation. PMH is significant for T2DM, HTN, PVD, gout, and tobacco use.   Clinical Impression   PTA, pt reports modified independence with ADL and functional mobility utilizing standard walker. Pt currently requiring mod assist for LB dressing, min assist for LB bathing, and mod assist for toileting hygiene. Pt limited by pain this session but was able to complete seated grooming tasks at EOB with set-up and complete simulated toilet transfers with anterior-posterior technique with supervision. Pt is a good candidate for CIR level therapies post-acute D/C to maximize return to independence. Will continue to follow while admitted.     Follow Up Recommendations  CIR    Equipment Recommendations  3 in 1 bedside commode;Other (comment);Tub/shower bench(Drop-arm 3-in-1)    Recommendations for Other Services Rehab consult     Precautions / Restrictions Precautions Precautions: Fall Precaution Comments: wound VAC Restrictions Weight Bearing Restrictions: Yes LLE Weight Bearing: Non weight bearing      Mobility Bed Mobility Overal bed mobility: Needs Assistance Bed Mobility: Supine to Sit;Sit to Supine     Supine to sit: HOB elevated;Supervision     General bed mobility comments: Supervision for safety.   Transfers Overall transfer level: Needs assistance   Transfers: Comptroller transfers: Supervision   General transfer comment: With cues, pt was able to complete anterior-posterior transfer from bed to recliner with supervision. Utilized this technique due to pain.     Balance Overall balance assessment: Needs assistance Sitting-balance support: No upper extremity supported;Feet supported Sitting balance-Leahy Scale: Fair Sitting  balance - Comments: Some posterior LOB noted at EOB but able to self-correct.                                    ADL either performed or assessed with clinical judgement   ADL Overall ADL's : Needs assistance/impaired Eating/Feeding: Set up;Sitting   Grooming: Set up;Oral care;Wash/dry face;Sitting   Upper Body Bathing: Set up;Sitting   Lower Body Bathing: Minimal assistance;Sitting/lateral leans   Upper Body Dressing : Set up;Sitting   Lower Body Dressing: Sitting/lateral leans;Moderate assistance Lower Body Dressing Details (indicate cue type and reason): including limb protector Toilet Transfer: Supervision/safety;Anterior/posterior Toilet Transfer Details (indicate cue type and reason): Pt with significant pain and deferred attempted stand-pivot today. Able to complete anterior-posterior transfer with supervision.  Toileting- Clothing Manipulation and Hygiene: Moderate assistance;Sitting/lateral lean         General ADL Comments: Pt able to sit at EOB for grooming tasks prior to anterior-posterior transfer to recliner chair. Assisted to don limb protector and pt verbalizes understanding of its benefits.      Vision Patient Visual Report: No change from baseline Vision Assessment?: No apparent visual deficits     Perception     Praxis      Pertinent Vitals/Pain Pain Assessment: 0-10 Pain Score: 8  Pain Location: L residual limb Pain Descriptors / Indicators: Sore;Guarding Pain Intervention(s): Limited activity within patient's tolerance;Monitored during session;Repositioned     Hand Dominance     Extremity/Trunk Assessment Upper Extremity Assessment Upper Extremity Assessment: Overall WFL for tasks assessed   Lower Extremity Assessment Lower Extremity Assessment: LLE deficits/detail LLE Deficits / Details: Assisted pt to don limb protector for transfer. [name] is  s/p L transtibial amputation.    Cervical / Trunk Assessment Cervical / Trunk  Assessment: Normal   Communication Communication Communication: No difficulties   Cognition Arousal/Alertness: Awake/alert Behavior During Therapy: WFL for tasks assessed/performed Overall Cognitive Status: Within Functional Limits for tasks assessed                                 General Comments: Pt pleasant throughout session.    General Comments       Exercises     Shoulder Instructions      Home Living Family/patient expects to be discharged to:: Private residence Living Arrangements: Children Available Help at Discharge: Family;Available 24 hours/day Type of Home: House Home Access: Stairs to enter [employer] of Steps: 3 Entrance Stairs-Rails: Right;Left Home Layout: One level     Bathroom Shower/Tub: Teacher, early years/pre: Standard     Home Equipment: Environmental consultant - standard          Prior Functioning/Environment Level of Independence: Independent with assistive device(s)        Comments: ambulated with standard walker due to pain        OT Problem List: Decreased strength;Decreased range of motion;Decreased activity tolerance;Impaired balance (sitting and/or standing);Decreased safety awareness;Decreased knowledge of use of DME or AE;Decreased knowledge of precautions;Pain      OT Treatment/Interventions: Self-care/ADL training;Therapeutic exercise;Energy conservation;DME and/or AE instruction;Therapeutic activities;Patient/family education;Balance training    OT Goals(Current goals can be found in the care plan section) Acute Rehab OT Goals Patient Stated Goal: get a LE prosthesis OT Goal Formulation: With patient Time For Goal Achievement: 10/22/17 Potential to Achieve Goals: Good ADL Goals Pt Will Perform Lower Body Bathing: with min guard assist;sit to/from stand Pt Will Perform Lower Body Dressing: with min guard assist;sit to/from stand Pt Will Transfer to Toilet: with min assist;ambulating;bedside  commode Pt Will Perform Toileting - Clothing Manipulation and hygiene: with min guard assist;sit to/from stand Pt Will Perform Tub/Shower Transfer: Tub transfer;tub bench;rolling walker;ambulating;with min assist  OT Frequency: Min 2X/week   Barriers to D/C:            Co-evaluation              AM-PAC PT "6 Clicks" Daily Activity     Outcome Measure Help from another person eating meals?: None Help from another person taking care of personal grooming?: None Help from another person toileting, which includes using toliet, bedpan, or urinal?: A Lot Help from another person bathing (including washing, rinsing, drying)?: A Lot Help from another person to put on and taking off regular upper body clothing?: None Help from another person to put on and taking off regular lower body clothing?: A Lot 6 Click Score: 18   End of Session Nurse Communication: Other (comment)(mobility tech - pt up in chair with alarm)  Activity Tolerance: Patient tolerated treatment well Patient left: in chair;with call bell/phone within reach;with chair alarm set  OT Visit Diagnosis: Other abnormalities of gait and mobility (R26.89);Pain Pain - Right/Left: Left Pain - part of body: Leg(residual limb)                Time: 0737-1062 OT Time Calculation (min): 18 min Charges:  OT General Charges $OT Visit: 1 Visit OT Evaluation $OT Eval Moderate Complexity: 1 Mod G-Codes:     Norman Herrlich, MS OTR/L  Pager: [facility] A Regino Fournet 10/08/2017, 9:08 AM

## 2017-10-08 NOTE — Progress Notes (Signed)
Inpatient Diabetes Program Recommendations  AACE/ADA: New Consensus Statement on Inpatient Glycemic Control (2019)  Target Ranges:  Prepandial:   less than 140 mg/dL      Peak postprandial:   less than 180 mg/dL (1-2 hours)      Critically ill patients:  140 - 180 mg/dL   Results for [name], [name] (MRN [id number]) as of 10/08/2017 10:51  Ref. Range 10/07/2017 08:00 10/07/2017 12:30 10/07/2017 16:52 10/07/2017 21:28 10/08/2017 08:41  Glucose-Capillary Latest Ref Range: 70 - 99 mg/dL 160 (H) 211 (H) 226 (H) 236 (H) 232 (H)   Review of Glycemic Control  Diabetes history: DM2 Outpatient Diabetes medications: Lantus 70 units daily, Novolog 5 units TID with meals (not taking per chart), Metformin XR 2000 mg QAM Current orders for Inpatient glycemic control: Novolog 0-15 units TID with meals, Lantus 10 units daily  Inpatient Diabetes Program Recommendations: Insulin - Basal: Noted Lantus increased to 10 units daily today. Correction (SSI): Please consider ordering Novolog 0-5 units QHS for bedtime correction. Insulin - Meal Coverage: Please consider ordering Novolog 4 units TID with meals for meal coverage if patient eats at least 50% of meals. HgbA1C: A1C 11.4% on 10/02/2017 indicating an average glucose of 280 mg/dl over the past 2-3 months. Patient was seen by R.Vaughan, RD, CDE, Inpatient Diabetes Coordinator on 10/04/2017 regarding A1C.  Thanks, Barnie Alderman, RN, MSN, CDE Diabetes Coordinator Inpatient Diabetes Program [phone] (Team Pager from 8am to 5pm)

## 2017-10-08 NOTE — Progress Notes (Signed)
Patient ID: [name], male   DOB: 1960-05-05, 57 y.o.   MRN: [id number] Patient is status post transtibial amputation.  The wound VAC has no drainage.  Patient is wearing the limb protector discussed the importance of working on knee extension.  Anticipate discharge to skilled nursing.  Discharge with the Praveena plus portable pump.

## 2017-10-08 NOTE — Consult Note (Signed)
Physical Medicine and Rehabilitation Consult Reason for Consult: Decreased functional mobility Referring Physician: Family medicine   HPI: [name] is a 58 y.o. right-handed male with history of hypertension, uncontrolled diabetes mellitus, tobacco abuse, left ankle fracture with ORIF 1 year ago, peripheral vascular disease.  Per chart review and patient, patient lives with son.  Independent with standard walker prior to admission.  One level home 3 steps to entry.  Son works during the day.  No other local family.  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  Underwent left transtibial amputation 10/05/2017 per [name] and wound VAC applied.  Hospital course pain management.  Acute blood loss anemia 9.4 and monitored.  Hemoglobin A1c 11.4 with insulin therapy as directed.  Physical and occupational therapy evaluations completed with recommendations of physical medicine rehab consult.  Review of Systems  Constitutional: Negative for chills and fever.  HENT: Negative for hearing loss.   Eyes: Negative for blurred vision and double vision.  Respiratory: Negative for shortness of breath.   Cardiovascular: Positive for leg swelling. Negative for chest pain and palpitations.  Gastrointestinal: Positive for constipation. Negative for nausea and vomiting.  Genitourinary: Negative for dysuria, flank pain and hematuria.  Musculoskeletal: Positive for joint pain and myalgias.  Skin: Negative for rash.  Psychiatric/Behavioral: The patient has insomnia.   All other systems reviewed and are negative.  Past Medical History:  Diagnosis Date  . Diabetes mellitus without complication ([place])   . Gout   . Hyperlipidemia   . Hypertension   . Insomnia    Past Surgical History:  Procedure Laterality Date  . JOINT REPLACEMENT     right  knee  . LEG SURGERY     Family History  Problem Relation Age of Onset  . Diabetes Mother    Social History:  reports that he has been smoking cigarettes.  He has a 50.00 pack-year smoking history. He has never used smokeless tobacco. He reports that he drinks about 2.4 oz of alcohol per week. He reports that he does not use drugs. Allergies: No Known Allergies Medications Prior to Admission  Medication Sig Dispense Refill  . amitriptyline (ELAVIL) 25 MG tablet Take 50 mg by mouth.    Marland Kitchen amLODipine (NORVASC) 10 MG tablet Take 10 mg by mouth daily.    . diclofenac (VOLTAREN) 75 MG EC tablet Take 1 tablet (75 mg total) by mouth 2 (two) times daily. 60 tablet 1  . furosemide (LASIX) 40 MG tablet Take 40 mg by mouth daily.    Marland Kitchen lisinopril (PRINIVIL,ZESTRIL) 40 MG tablet Take 40 mg by mouth daily.    . metFORMIN (GLUCOPHAGE-XR) 500 MG 24 hr tablet Take 2,000 mg by mouth daily with breakfast.    . aspirin 81 MG chewable tablet Chew 81 mg by mouth daily.    . colchicine 0.6 MG tablet Take 0.6 mg by mouth.    . etodolac (LODINE) 200 MG capsule Take 1 capsule (200 mg total) by mouth every 8 (eight) hours. (Patient not taking: Reported on 05/28/2017) 12 capsule 0  . gabapentin (NEURONTIN) 100 MG capsule Take 1 capsule (100 mg total) by mouth 3 (three) times daily. (Patient not taking: Reported on 05/28/2017) 90 capsule 5  . glucose blood test strip Use as instructed 100 each 12  . HYDROcodone-acetaminophen (NORCO) 5-325 MG tablet Take 1 tablet by  mouth every 6 (six) hours as needed for moderate pain. (Patient not taking: Reported on 10/04/2017) 15 tablet 0  . ibuprofen (ADVIL,MOTRIN) 800 MG tablet Take 1 tablet (800 mg total) by mouth every 8 (eight) hours as needed for moderate pain. (Patient not taking: Reported on 10/04/2017) 15 tablet 0  . insulin aspart (NOVOLOG) 100 UNIT/ML injection Inject 5 Units into the skin 3 (three) times daily with meals. Please also use Novolog insulin  as sliding scale according  to the chart you are given, thank you (Patient not taking: Reported on 05/28/2017) 20 mL 11  . insulin glargine (LANTUS) 100 UNIT/ML injection Inject 70 Units into the skin daily.     . Insulin Syringe-Needle U-100 25G X 1" 1 ML MISC Any brand, for 4 times a day insulin SQ, 1 month supply. 30 each 0  . Lancets (ACCU-CHEK MULTICLIX) lancets Use as instructed 100 each 12  . metoprolol succinate (TOPROL-XL) 25 MG 24 hr tablet Take 25 mg by mouth daily.    . traMADol (ULTRAM) 50 MG tablet Take 1 tablet (50 mg total) by mouth every 6 (six) hours as needed. (Patient not taking: Reported on 10/04/2017) 12 tablet 0    Home: Home Living Family/patient expects to be discharged to:: Private residence Living Arrangements: Children Available Help at Discharge: Family, Available 24 hours/day Type of Home: House Home Access: Stairs to enter [employer] of Steps: 3 Entrance Stairs-Rails: Right, Left Home Layout: One level Bathroom Shower/Tub: Walk-in shower Home Equipment: Environmental consultant - standard  Functional History: Prior Function Level of Independence: Independent with assistive device(s) Comments: ambulated with standard walker Functional Status:  Mobility: Bed Mobility Overal bed mobility: Needs Assistance Bed Mobility: Supine to Sit, Sit to Supine Supine to sit: Min guard, HOB elevated General bed mobility comments: increased time and effort, min guard for safety, +rail Transfers Overall transfer level: Needs assistance Equipment used: Rolling walker (2 wheeled) Transfers: Sit to/from Stand Sit to Stand: +2 physical assistance, Min assist Stand pivot transfers: Min assist, +2 safety/equipment, +2 physical assistance General transfer comment: Static stand with RW x 30 seconds. Pt required return to EOB due to 10/10 pain. Unable to progress transfers or ambulation. Ambulation/Gait General Gait Details: unable due to pain    ADL:    Cognition: Cognition Overall Cognitive Status:  Within Functional Limits for tasks assessed Orientation Level: Oriented X4 Cognition Arousal/Alertness: Awake/alert Behavior During Therapy: WFL for tasks assessed/performed Overall Cognitive Status: Within Functional Limits for tasks assessed General Comments: mildly agitated. Perseverating on pain. Reports he slept in the recliner last night and just got back in bed.   Blood pressure (!) 130/93, pulse 66, temperature 98.4 F (36.9 C), temperature source Oral, resp. rate 16, height 5\' 7"  (1.702 m), weight 102.5 kg (226 lb), SpO2 99 %. Physical Exam  Vitals reviewed. Constitutional: He is oriented to person, place, and time. He appears well-developed.  Obese  HENT:  Head: Normocephalic and atraumatic.  Eyes: EOM are normal. Right eye exhibits no discharge. Left eye exhibits no discharge.  Neck: Normal range of motion. Neck supple. No thyromegaly present.  Cardiovascular: Normal rate and regular rhythm.  Respiratory: Effort normal and breath sounds normal. No respiratory distress.  GI: Soft. Bowel sounds are normal. He exhibits no distension.  Musculoskeletal:  Left stump tenderness  Neurological: He is alert and oriented to person, place, and time.  Motor: B/l UE 4+/5 proximal to distal RLE: 4-/5 proximal to distal (pain inhibition) LLE: 4-/5 HF (pain inhibition) Sensation intact to  light touch  Skin:  Left amputation site dressed with limb guard in place.   Psychiatric: His affect is blunt. His speech is delayed. He is slowed.    Results for orders placed or performed during the hospital encounter of 10/02/17 (from the past 24 hour(s))  Comprehensive metabolic panel     Status: Abnormal   Collection Time: 10/07/17  6:50 AM  Result Value Ref Range   Sodium 133 (L) 135 - 145 mmol/L   Potassium 5.1 3.5 - 5.1 mmol/L   Chloride 101 98 - 111 mmol/L   CO2 22 22 - 32 mmol/L   Glucose, Bld 194 (H) 70 - 99 mg/dL   BUN 14 6 - 20 mg/dL   Creatinine, Ser 0.98 0.61 - 1.24 mg/dL    Calcium 8.9 8.9 - 10.3 mg/dL   Total Protein 8.9 (H) 6.5 - 8.1 g/dL   Albumin 2.3 (L) 3.5 - 5.0 g/dL   AST 23 15 - 41 U/L   ALT 21 0 - 44 U/L   Alkaline Phosphatase 92 38 - 126 U/L   Total Bilirubin 0.4 0.3 - 1.2 mg/dL   GFR calc non Af Amer >60 >60 mL/min   GFR calc Af Amer >60 >60 mL/min   Anion gap 10 5 - 15  CBC     Status: Abnormal   Collection Time: 10/07/17  6:50 AM  Result Value Ref Range   WBC 6.9 4.0 - 10.5 K/uL   RBC 3.36 (L) 4.22 - 5.81 MIL/uL   Hemoglobin 9.5 (L) 13.0 - 17.0 g/dL   HCT 30.6 (L) 39.0 - 52.0 %   MCV 91.1 78.0 - 100.0 fL   MCH 28.3 26.0 - 34.0 pg   MCHC 31.0 30.0 - 36.0 g/dL   RDW 14.0 11.5 - 15.5 %   Platelets 629 (H) 150 - 400 K/uL  Glucose, capillary     Status: Abnormal   Collection Time: 10/07/17  8:00 AM  Result Value Ref Range   Glucose-Capillary 160 (H) 70 - 99 mg/dL   Comment 1 Notify RN   Glucose, capillary     Status: Abnormal   Collection Time: 10/07/17 12:30 PM  Result Value Ref Range   Glucose-Capillary 211 (H) 70 - 99 mg/dL   Comment 1 Notify RN   Glucose, capillary     Status: Abnormal   Collection Time: 10/07/17  4:52 PM  Result Value Ref Range   Glucose-Capillary 226 (H) 70 - 99 mg/dL   Comment 1 Notify RN   Glucose, capillary     Status: Abnormal   Collection Time: 10/07/17  9:28 PM  Result Value Ref Range   Glucose-Capillary 236 (H) 70 - 99 mg/dL   No results found.   Assessment/Plan: Diagnosis: Left BKA Labs independently reviewed.  Records reviewed and summated above. Clean amputation daily with soap and water Monitor incision site for signs of infection or impending skin breakdown. Staples to remain in place for 3-4 weeks Stump shrinker, for edema control  Scar mobilization massaging to prevent soft tissue adherence Stump protector during therapies Prevent flexion contractures by implementing the following:   Encourage prone lying for 20-30 mins per day BID to avoid hip flexion  Contractures if medically  appropriate;  Avoid pillow under knees when patient is lying in bed in order to prevent both  knee and hip flexion contractures;  Avoid prolonged sitting Post surgical pain control with oral medication Phantom limb pain control with physical modalities including desensitization techniques (gentle self massage to  the residual stump,hot packs if sensation intact, [place]) and mirror therapy, TENS. If ineffective, consider pharmacological treatment for neuropathic pain (e.g gabapentin, pregabalin, amytriptalyine, duloxetine).  When using wheelchair, patient should have knee on amputated side fully extended with board under the seat cushion. Avoid injury to contralateral side  1. Does the need for close, 24 hr/day medical supervision in concert with the patient's rehab needs make it unreasonable for this patient to be served in a less intensive setting? Yes  2. Co-Morbidities requiring supervision/potential complications: Acute blood loss anemia (transfuse if necessary to ensure appropriate perfusion for increased activity tolerance), post-op pain management (Biofeedback training with therapies to help reduce reliance on opiate pain medications, monitor pain control during therapies, and sedation at rest and titrate to maximum efficacy to ensure participation and gains in therapies), HTN (monitor and provide prns in accordance with increased physical exertion and pain), uncontrolled diabetes mellitus (Monitor in accordance with exercise and adjust meds as necessary), tobacco abuse (counsel), peripheral vascular disease (cont meds) 3. Due to safety, skin/wound care, disease management, pain management and patient education, does the patient require 24 hr/day rehab nursing? Yes 4. Does the patient require coordinated care of a physician, rehab nurse, PT (1-2 hrs/day, 5 days/week) and OT (1-2 hrs/day, 5 days/week) to address physical and functional deficits in the context of the above medical diagnosis(es)?  Yes Addressing deficits in the following areas: balance, endurance, locomotion, strength, transferring, bathing, dressing, toileting and psychosocial support 5. Can the patient actively participate in an intensive therapy program of at least 3 hrs of therapy per day at least 5 days per week? Potentially 6. The potential for patient to make measurable gains while on inpatient rehab is excellent 7. Anticipated functional outcomes upon discharge from inpatient rehab are modified independent and supervision  with PT, modified independent and supervision with OT, n/a with SLP. 8. Estimated rehab length of stay to reach the above functional goals is: 4-8 days. 9. Anticipated D/C setting: Home 10. Anticipated post D/C treatments: HH therapy and Home excercise program 11. Overall Rehab/Functional Prognosis: good  RECOMMENDATIONS: This patient's condition is appropriate for continued rehabilitative care in the following setting: CIR when pain better controlled and able to tolerate 3 hours of therapy/day. Patient has agreed to participate in recommended program. Yes Note that insurance prior authorization may be required for reimbursement for recommended care.  Comment: Rehab Admissions Coordinator to follow up.   I have personally performed a face to face diagnostic evaluation, including, but not limited to relevant history and physical exam findings, of this patient and developed relevant assessment and plan.  Additionally, I have reviewed and concur with the physician assistant's documentation above.   Delice Lesch, MD, ABPMR Lavon Paganini Angiulli, PA-C 10/08/2017

## 2017-10-08 NOTE — Anesthesia Postprocedure Evaluation (Signed)
Anesthesia Post Note  Patient: [name]  Procedure(s) Performed: LEFT BELOW KNEE AMPUTATION (Left )     Patient location during evaluation: PACU Anesthesia Type: Regional Level of consciousness: awake and alert Pain management: pain level controlled Vital Signs Assessment: post-procedure vital signs reviewed and stable Respiratory status: spontaneous breathing Cardiovascular status: stable Anesthetic complications: no    Last Vitals:  Vitals:   10/07/17 2129 10/08/17 0506  BP: 129/66 (!) 130/93  Pulse: 63 66  Resp: 18 16  Temp: 36.8 C 36.9 C  SpO2: 100% 99%    Last Pain:  Vitals:   10/08/17 0609  TempSrc:   PainSc: [name]

## 2017-10-08 NOTE — Progress Notes (Signed)
Physical Therapy Treatment Patient Details Name: [name] MRN: [id number] DOB: 22-May-1960 Today's Date: 10/08/2017    History of Present Illness Pt is a 57 y/o male s/p L transtibial amputation. PMH is significant for T2DM, HTN, PVD, gout, and tobacco use.    PT Comments    Pt was able to stand and pivot with RW from recliner chair to the bed with two person assist today.  He was unable to take pivotal hops, but did turn in standing on his right foot with RW.  HEP program initiated with emphasis on knee extension and hip extension.  Limb protector donned in supine at the end of the session (removed for mobility as it has a tendency to slip off).  PT will continue to follow acutely for safe progression of mobility.   Follow Up Recommendations  CIR     Equipment Recommendations  3in1 (PT);Rolling walker with 5" wheels;Wheelchair (measurements PT);Wheelchair cushion (measurements PT);Other (comment)(18x18 WC with L amputee pad)    Recommendations for Other Services   NA     Precautions / Restrictions Precautions Precautions: Fall Precaution Comments: wound VAC Required Braces or Orthoses: Other Brace/Splint Other Brace/Splint: L leg limb guard Restrictions LLE Weight Bearing: Non weight bearing    Mobility  Bed Mobility Overal bed mobility: Needs Assistance Bed Mobility: Sit to Supine       Sit to supine: Supervision   General bed mobility comments: supervision for safety as pt used momentum and gravity to help lower trunk and lift legs back into bed.   Transfers Overall transfer level: Needs assistance Equipment used: Rolling walker (2 wheeled) Transfers: Sit to/from [facility] Sit to Stand: +2 physical assistance;Min assist Stand pivot transfers: +2 physical assistance;Min assist       General transfer comment: Two person min assist with RW, verbal cues for safe hand placement, pt needed stability at trunk and RW during movement, and he was  only able to pivot on his right foot to the right side.  He was unable to hop and tried to sit prematurely before he was turned all the way around to the bed.  Uncontrolled descent to sit down.   Ambulation/Gait             General Gait Details: unable at this time.           Balance Overall balance assessment: Needs assistance Sitting-balance support: Feet supported;Bilateral upper extremity supported;No upper extremity supported Sitting balance-Leahy Scale: Fair     Standing balance support: Bilateral upper extremity supported Standing balance-Leahy Scale: Poor                              Cognition Arousal/Alertness: Awake/alert Behavior During Therapy: WFL for tasks assessed/performed Overall Cognitive Status: Within Functional Limits for tasks assessed                                        Exercises Amputee Exercises Quad Sets: AROM;Left;10 reps Hip Extension: AROM;Left;10 reps;Sidelying    General Comments General comments (skin integrity, edema, etc.): Educated on phantom limb pain and self massage, educated on importance of knee extension and hip extension ROM for a successful prosthesis.       Pertinent Vitals/Pain Pain Assessment: Faces Faces Pain Scale: Hurts whole lot Pain Location: L residual limb Pain Descriptors / Indicators: Sore;Guarding Pain Intervention(s): Limited activity  within patient's tolerance;Monitored during session;Repositioned           PT Goals (current goals can now be found in the care plan section) Acute Rehab PT Goals Patient Stated Goal: get a LE prosthesis Progress towards PT goals: Progressing toward goals    Frequency    Min 5X/week      PT Plan Current plan remains appropriate       AM-PAC PT "6 Clicks" Daily Activity  Outcome Measure  Difficulty turning over in bed (including adjusting bedclothes, sheets and blankets)?: A Little Difficulty moving from lying on back to sitting  on the side of the bed? : A Little Difficulty sitting down on and standing up from a chair with arms (e.g., wheelchair, bedside commode, etc,.)?: Unable Help needed moving to and from a bed to chair (including a wheelchair)?: A Little Help needed walking in hospital room?: Total Help needed climbing 3-5 steps with a railing? : Total 6 Click Score: 12    End of Session   Activity Tolerance: Patient limited by pain Patient left: in bed;with call bell/phone within reach   PT Visit Diagnosis: Other abnormalities of gait and mobility (R26.89);Pain Pain - Right/Left: Left Pain - part of body: Leg     Time: 5790-3833 PT Time Calculation (min) (ACUTE ONLY): 13 min  Charges:  $Therapeutic Activity: 8-22 mins          Lynnleigh Soden B. Ripley, [place], DPT [phone]            10/08/2017, 10:17 PM

## 2017-10-09 ENCOUNTER — Inpatient Hospital Stay (HOSPITAL_COMMUNITY)
Admission: RE | Admit: 2017-10-09 | Discharge: 2017-10-18 | DRG: 560 | Disposition: A | Discharge status: Home Health | Payer: Medicaid Other | Source: Intra-hospital | Attending: Physical Medicine & Rehabilitation | Admitting: Physical Medicine & Rehabilitation

## 2017-10-09 ENCOUNTER — Encounter (HOSPITAL_COMMUNITY): Payer: Self-pay

## 2017-10-09 ENCOUNTER — Other Ambulatory Visit: Payer: Self-pay

## 2017-10-09 DIAGNOSIS — D638 Anemia in other chronic diseases classified elsewhere: Secondary | ICD-10-CM | POA: Diagnosis present

## 2017-10-09 DIAGNOSIS — Z79891 Long term (current) use of opiate analgesic: Secondary | ICD-10-CM

## 2017-10-09 DIAGNOSIS — M109 Gout, unspecified: Secondary | ICD-10-CM | POA: Diagnosis present

## 2017-10-09 DIAGNOSIS — I10 Essential (primary) hypertension: Secondary | ICD-10-CM | POA: Diagnosis present

## 2017-10-09 DIAGNOSIS — D62 Acute posthemorrhagic anemia: Secondary | ICD-10-CM | POA: Diagnosis present

## 2017-10-09 DIAGNOSIS — Z96651 Presence of right artificial knee joint: Secondary | ICD-10-CM | POA: Diagnosis present

## 2017-10-09 DIAGNOSIS — F17210 Nicotine dependence, cigarettes, uncomplicated: Secondary | ICD-10-CM | POA: Diagnosis present

## 2017-10-09 DIAGNOSIS — Z4781 Encounter for orthopedic aftercare following surgical amputation: Principal | ICD-10-CM

## 2017-10-09 DIAGNOSIS — E1140 Type 2 diabetes mellitus with diabetic neuropathy, unspecified: Secondary | ICD-10-CM | POA: Diagnosis present

## 2017-10-09 DIAGNOSIS — E785 Hyperlipidemia, unspecified: Secondary | ICD-10-CM | POA: Diagnosis present

## 2017-10-09 DIAGNOSIS — Z794 Long term (current) use of insulin: Secondary | ICD-10-CM

## 2017-10-09 DIAGNOSIS — Z833 Family history of diabetes mellitus: Secondary | ICD-10-CM

## 2017-10-09 DIAGNOSIS — Z89512 Acquired absence of left leg below knee: Secondary | ICD-10-CM

## 2017-10-09 DIAGNOSIS — E1165 Type 2 diabetes mellitus with hyperglycemia: Secondary | ICD-10-CM | POA: Diagnosis present

## 2017-10-09 DIAGNOSIS — R7989 Other specified abnormal findings of blood chemistry: Secondary | ICD-10-CM | POA: Diagnosis present

## 2017-10-09 DIAGNOSIS — G4700 Insomnia, unspecified: Secondary | ICD-10-CM | POA: Diagnosis present

## 2017-10-09 DIAGNOSIS — S88111S Complete traumatic amputation at level between knee and ankle, right lower leg, sequela: Secondary | ICD-10-CM | POA: Diagnosis not present

## 2017-10-09 DIAGNOSIS — Z79899 Other long term (current) drug therapy: Secondary | ICD-10-CM

## 2017-10-09 DIAGNOSIS — S88112A Complete traumatic amputation at level between knee and ankle, left lower leg, initial encounter: Secondary | ICD-10-CM

## 2017-10-09 DIAGNOSIS — D473 Essential (hemorrhagic) thrombocythemia: Secondary | ICD-10-CM | POA: Diagnosis not present

## 2017-10-09 DIAGNOSIS — G546 Phantom limb syndrome with pain: Secondary | ICD-10-CM | POA: Diagnosis present

## 2017-10-09 DIAGNOSIS — K5903 Drug induced constipation: Secondary | ICD-10-CM | POA: Diagnosis present

## 2017-10-09 DIAGNOSIS — Z7982 Long term (current) use of aspirin: Secondary | ICD-10-CM | POA: Diagnosis not present

## 2017-10-09 DIAGNOSIS — I739 Peripheral vascular disease, unspecified: Secondary | ICD-10-CM

## 2017-10-09 DIAGNOSIS — E1159 Type 2 diabetes mellitus with other circulatory complications: Secondary | ICD-10-CM

## 2017-10-09 DIAGNOSIS — D75839 Thrombocytosis, unspecified: Secondary | ICD-10-CM

## 2017-10-09 DIAGNOSIS — IMO0002 Reserved for concepts with insufficient information to code with codable children: Secondary | ICD-10-CM | POA: Insufficient documentation

## 2017-10-09 LAB — GLUCOSE, CAPILLARY
Glucose-Capillary: 212 mg/dL — ABNORMAL HIGH (ref 70–99)
Glucose-Capillary: 259 mg/dL — ABNORMAL HIGH (ref 70–99)

## 2017-10-09 MED ORDER — OXYCODONE HCL 5 MG PO TABS: 5.0000 mg | ORAL_TABLET | ORAL | 0 refills | Status: DC | PRN

## 2017-10-09 MED ORDER — INSULIN GLARGINE 100 UNIT/ML ~~LOC~~ SOLN: 14.0000 [IU] | Freq: Every day | SUBCUTANEOUS | 11 refills | Status: DC

## 2017-10-09 MED ORDER — FLEET ENEMA 7-19 GM/118ML RE ENEM: 1.0000 | ENEMA | Freq: Once | RECTAL | Status: DC | PRN

## 2017-10-09 MED ORDER — INSULIN GLARGINE 100 UNIT/ML ~~LOC~~ SOLN: 14.0000 [IU] | Freq: Every day | SUBCUTANEOUS | Status: DC

## 2017-10-09 MED ORDER — MELATONIN 3 MG PO TABS
4.5000 mg | ORAL_TABLET | Freq: Every evening | ORAL | Status: DC | PRN
  Administered 2017-10-09 – 2017-10-10 (×2): 4.5 mg via ORAL
  Filled 2017-10-09 (×4): qty 1.5

## 2017-10-09 MED ORDER — INSULIN ASPART 100 UNIT/ML ~~LOC~~ SOLN
0.0000 [IU] | Freq: Every day | SUBCUTANEOUS | Status: DC
  Administered 2017-10-09 – 2017-10-10 (×2): 2 [IU] via SUBCUTANEOUS

## 2017-10-09 MED ORDER — ALUM & MAG HYDROXIDE-SIMETH 200-200-20 MG/5ML PO SUSP: 30.0000 mL | ORAL | Status: DC | PRN

## 2017-10-09 MED ORDER — GABAPENTIN 300 MG PO CAPS
300.0000 mg | ORAL_CAPSULE | Freq: Three times a day (TID) | ORAL | Status: DC
  Administered 2017-10-09 – 2017-10-18 (×26): 300 mg via ORAL
  Filled 2017-10-09 (×26): qty 1

## 2017-10-09 MED ORDER — PANTOPRAZOLE SODIUM 40 MG PO TBEC
40.0000 mg | DELAYED_RELEASE_TABLET | Freq: Every day | ORAL | Status: DC
  Administered 2017-10-10 – 2017-10-18 (×9): 40 mg via ORAL
  Filled 2017-10-09 (×9): qty 1

## 2017-10-09 MED ORDER — BISACODYL 10 MG RE SUPP: 10.0000 mg | Freq: Every day | RECTAL | Status: DC | PRN

## 2017-10-09 MED ORDER — INSULIN GLARGINE 100 UNIT/ML ~~LOC~~ SOLN
14.0000 [IU] | Freq: Every day | SUBCUTANEOUS | Status: DC
  Administered 2017-10-10 – 2017-10-11 (×2): 14 [IU] via SUBCUTANEOUS
  Filled 2017-10-09 (×2): qty 0.14

## 2017-10-09 MED ORDER — TRAZODONE HCL 50 MG PO TABS
25.0000 mg | ORAL_TABLET | Freq: Every evening | ORAL | Status: DC | PRN
  Administered 2017-10-09 – 2017-10-17 (×6): 50 mg via ORAL
  Filled 2017-10-09 (×6): qty 1

## 2017-10-09 MED ORDER — POLYETHYLENE GLYCOL 3350 17 G PO PACK: 17.0000 g | PACK | Freq: Every day | ORAL | 0 refills | Status: DC

## 2017-10-09 MED ORDER — GABAPENTIN 300 MG PO CAPS: 300.0000 mg | ORAL_CAPSULE | Freq: Three times a day (TID) | ORAL | Status: DC

## 2017-10-09 MED ORDER — ENOXAPARIN SODIUM 40 MG/0.4ML ~~LOC~~ SOLN
40.0000 mg | SUBCUTANEOUS | Status: DC
  Administered 2017-10-09 – 2017-10-17 (×9): 40 mg via SUBCUTANEOUS
  Filled 2017-10-09 (×9): qty 0.4

## 2017-10-09 MED ORDER — AMLODIPINE BESYLATE 10 MG PO TABS
10.0000 mg | ORAL_TABLET | Freq: Every day | ORAL | Status: DC
  Administered 2017-10-10 – 2017-10-18 (×9): 10 mg via ORAL
  Filled 2017-10-09 (×9): qty 1

## 2017-10-09 MED ORDER — OXYCODONE HCL 5 MG PO TABS
5.0000 mg | ORAL_TABLET | ORAL | Status: DC | PRN
  Administered 2017-10-09: 5 mg via ORAL
  Administered 2017-10-09: 10 mg via ORAL
  Administered 2017-10-10: 5 mg via ORAL
  Filled 2017-10-09: qty 1
  Filled 2017-10-09 (×2): qty 2

## 2017-10-09 MED ORDER — ACETAMINOPHEN 325 MG PO TABS
325.0000 mg | ORAL_TABLET | ORAL | Status: DC | PRN
  Administered 2017-10-15 – 2017-10-17 (×2): 650 mg via ORAL
  Filled 2017-10-09 (×4): qty 2

## 2017-10-09 MED ORDER — INSULIN ASPART 100 UNIT/ML ~~LOC~~ SOLN: 0.0000 [IU] | Freq: Three times a day (TID) | SUBCUTANEOUS | 11 refills | Status: DC

## 2017-10-09 MED ORDER — INSULIN ASPART 100 UNIT/ML ~~LOC~~ SOLN
0.0000 [IU] | Freq: Three times a day (TID) | SUBCUTANEOUS | Status: DC
  Administered 2017-10-09 – 2017-10-10 (×3): 5 [IU] via SUBCUTANEOUS
  Administered 2017-10-10 – 2017-10-13 (×10): 3 [IU] via SUBCUTANEOUS
  Administered 2017-10-14 – 2017-10-15 (×5): 2 [IU] via SUBCUTANEOUS
  Administered 2017-10-15: 3 [IU] via SUBCUTANEOUS
  Administered 2017-10-16: 2 [IU] via SUBCUTANEOUS
  Administered 2017-10-16: 3 [IU] via SUBCUTANEOUS
  Administered 2017-10-16 – 2017-10-18 (×4): 2 [IU] via SUBCUTANEOUS

## 2017-10-09 MED ORDER — POLYETHYLENE GLYCOL 3350 17 G PO PACK
17.0000 g | PACK | Freq: Every day | ORAL | Status: DC
  Administered 2017-10-10 – 2017-10-18 (×7): 17 g via ORAL
  Filled 2017-10-09 (×9): qty 1

## 2017-10-09 MED ORDER — METOPROLOL SUCCINATE ER 25 MG PO TB24
25.0000 mg | ORAL_TABLET | Freq: Every day | ORAL | Status: DC
  Administered 2017-10-10 – 2017-10-18 (×9): 25 mg via ORAL
  Filled 2017-10-09 (×9): qty 1

## 2017-10-09 MED ORDER — DIPHENHYDRAMINE HCL 12.5 MG/5ML PO ELIX
12.5000 mg | ORAL_SOLUTION | Freq: Four times a day (QID) | ORAL | Status: DC | PRN
  Administered 2017-10-09: 25 mg via ORAL
  Filled 2017-10-09: qty 10

## 2017-10-09 MED ORDER — PROCHLORPERAZINE MALEATE 5 MG PO TABS: 5.0000 mg | ORAL_TABLET | Freq: Four times a day (QID) | ORAL | Status: DC | PRN

## 2017-10-09 MED ORDER — ASPIRIN EC 81 MG PO TBEC
81.0000 mg | DELAYED_RELEASE_TABLET | Freq: Every day | ORAL | Status: DC
  Administered 2017-10-10 – 2017-10-18 (×9): 81 mg via ORAL
  Filled 2017-10-09 (×9): qty 1

## 2017-10-09 MED ORDER — SENNOSIDES-DOCUSATE SODIUM 8.6-50 MG PO TABS
2.0000 | ORAL_TABLET | Freq: Every evening | ORAL | Status: DC | PRN
  Administered 2017-10-09: 2 via ORAL
  Filled 2017-10-09: qty 2

## 2017-10-09 MED ORDER — ACETAMINOPHEN 325 MG PO TABS
650.0000 mg | ORAL_TABLET | Freq: Four times a day (QID) | ORAL | Status: DC
  Administered 2017-10-09 – 2017-10-18 (×35): 650 mg via ORAL
  Filled 2017-10-09 (×35): qty 2

## 2017-10-09 MED ORDER — METFORMIN HCL ER 500 MG PO TB24
1000.0000 mg | ORAL_TABLET | Freq: Two times a day (BID) | ORAL | Status: DC
  Administered 2017-10-09 – 2017-10-18 (×18): 1000 mg via ORAL
  Filled 2017-10-09 (×19): qty 2

## 2017-10-09 MED ORDER — MELATONIN 3 MG PO TABS: 4.5000 mg | ORAL_TABLET | Freq: Every evening | ORAL | 0 refills | Status: DC | PRN

## 2017-10-09 MED ORDER — PROCHLORPERAZINE 25 MG RE SUPP: 12.5000 mg | Freq: Four times a day (QID) | RECTAL | Status: DC | PRN

## 2017-10-09 MED ORDER — ACETAMINOPHEN 325 MG PO TABS: 650.0000 mg | ORAL_TABLET | Freq: Four times a day (QID) | ORAL | 0 refills | Status: DC

## 2017-10-09 MED ORDER — GUAIFENESIN-DM 100-10 MG/5ML PO SYRP: 5.0000 mL | ORAL_SOLUTION | Freq: Four times a day (QID) | ORAL | Status: DC | PRN

## 2017-10-09 MED ORDER — PANTOPRAZOLE SODIUM 40 MG PO TBEC: 40.0000 mg | DELAYED_RELEASE_TABLET | Freq: Every day | ORAL | Status: DC

## 2017-10-09 MED ORDER — JUVEN PO PACK
1.0000 | PACK | Freq: Two times a day (BID) | ORAL | Status: DC
  Administered 2017-10-10: 1 via ORAL
  Filled 2017-10-09 (×2): qty 1

## 2017-10-09 MED ORDER — PROCHLORPERAZINE EDISYLATE 10 MG/2ML IJ SOLN: 5.0000 mg | Freq: Four times a day (QID) | INTRAMUSCULAR | Status: DC | PRN

## 2017-10-09 NOTE — Social Work (Addendum)
CSW aware CIR following for admissions, will continue to follow as back up plan.   12:30pm- CSW has been contacted by admissions coordinator with CIR, pt is going to be able to admit today.   CSW signing off. Please consult if any additional needs arise.  Alexander Mt, [place] [facility] [phone]
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
## 2017-10-09 NOTE — Progress Notes (Signed)
Physical Therapy Treatment Patient Details Name: [name] MRN: [id number] DOB: 12/15/1960 Today's Date: 10/09/2017    History of Present Illness Pt is a 57 y/o male s/p L transtibial amputation. PMH is significant for T2DM, HTN, PVD, gout, and tobacco use.    PT Comments    Pt performed supine/reclined exercises to encourage strengthening in B LEs.  Pt fatigues with activity and surprise at how weak he has gotten.  He did tolerate two standing trials during session with moderate assistance.  Pt  Motivated to return home and resume activities and continues to be an excellent candidate for aggressive CIR therapies.      Follow Up Recommendations  CIR     Equipment Recommendations  3in1 (PT);Rolling walker with 5" wheels;Wheelchair (measurements PT);Wheelchair cushion (measurements PT);Other (comment)(18x18 with L amputee pad.  )    Recommendations for Other Services Rehab consult     Precautions / Restrictions Precautions Precautions: Fall Precaution Comments: wound VAC Required Braces or Orthoses: Other Brace/Splint Other Brace/Splint: L leg limb guard Restrictions Weight Bearing Restrictions: Yes LLE Weight Bearing: Non weight bearing    Mobility  Bed Mobility               General bed mobility comments: Pt seated in recliner able to scoot in seated position unassisted.    Transfers Overall transfer level: Needs assistance Equipment used: Rolling walker (2 wheeled) Transfers: Sit to/from Stand Sit to Stand: Mod assist         General transfer comment: Cues for hand placement to and from seated surface.  Pt required cues for upper trunk control and hip extension.  Pt able to stand around 20-30 sec each trial before reaching fatigue.    Ambulation/Gait Ambulation/Gait assistance: (NT limited standing tolerance.  )               Stairs             Wheelchair Mobility    Modified Rankin (Stroke Patients Only)       Balance Overall  balance assessment: Needs assistance Sitting-balance support: Feet supported;Bilateral upper extremity supported;No upper extremity supported Sitting balance-Leahy Scale: Fair Sitting balance - Comments: Some posterior LOB noted at EOB but able to self-correct.      Standing balance-Leahy Scale: Poor Standing balance comment: reliant on bilateral UEs for balance                            Cognition Arousal/Alertness: Awake/alert Behavior During Therapy: WFL for tasks assessed/performed Overall Cognitive Status: Within Functional Limits for tasks assessed                                 General Comments: Pt pleasant throughout session.       Exercises General Exercises - Lower Extremity Ankle Circles/Pumps: AROM;Right;Supine;20 reps Quad Sets: AROM;Both;10 reps;Supine Heel Slides: AROM;10 reps;Both;Supine Hip ABduction/ADduction: AROM;Both;10 reps;Supine    General Comments        Pertinent Vitals/Pain Pain Assessment: 0-10 Pain Score: 7  Pain Location: L residual limb Pain Descriptors / Indicators: Sore;Guarding Pain Intervention(s): Monitored during session;Repositioned;RN gave pain meds during session;Patient requesting pain meds-RN notified    Home Living                      Prior Function            PT Goals (current goals  can now be found in the care plan section) Acute Rehab PT Goals Patient Stated Goal: get a LE prosthesis Potential to Achieve Goals: Good Progress towards PT goals: Progressing toward goals    Frequency    Min 5X/week      PT Plan Current plan remains appropriate    Co-evaluation              AM-PAC PT "6 Clicks" Daily Activity  Outcome Measure  Difficulty turning over in bed (including adjusting bedclothes, sheets and blankets)?: A Little Difficulty moving from lying on back to sitting on the side of the bed? : A Little Difficulty sitting down on and standing up from a chair with arms  (e.g., wheelchair, bedside commode, etc,.)?: Unable Help needed moving to and from a bed to chair (including a wheelchair)?: A Little Help needed walking in hospital room?: A Lot Help needed climbing 3-5 steps with a railing? : Total 6 Click Score: 13    End of Session Equipment Utilized During Treatment: Gait belt Activity Tolerance: Patient limited by pain Patient left: in bed;with call bell/phone within reach;in chair;with chair alarm set Nurse Communication: Patient requests pain meds;Mobility status PT Visit Diagnosis: Other abnormalities of gait and mobility (R26.89);Pain Pain - Right/Left: Left Pain - part of body: Leg     Time: 7829-5621 PT Time Calculation (min) (ACUTE ONLY): 22 min  Charges:  $Therapeutic Activity: 8-22 mins                    G Codes:       Governor Rooks, PTA pager [phone]    Cristela Blue 10/09/2017, 1:06 PM

## 2017-10-09 NOTE — Discharge Summary (Signed)
[facility] Discharge Summary  Patient name: [name] Medical record number: [id number] Date of birth: 08-Mar-1961 Age: 57 y.o. Gender: male Date of Admission: 10/02/2017  Date of Discharge: 10/09/2017 Admitting Physician: Nathan Resides, MD  Primary Care Provider: Vidal Schwalbe, MD Consultants: [name]  Indication for Hospitalization: L calcaneal Osteomyelitis  Discharge Diagnoses/Problem List:  1. L Calcaneal Osteomyelitis, S/P L BKA 2. T2DM 3. Anemia 4. AKI, resolved 5. HTN 6. Electrolyte Abnromalities, resolved 7. Tobacco use 8. Constipation, resolved  Disposition: CIR  Discharge Condition: Stable  Discharge Exam:   Physical Exam: General: 57yo male, sitting up in chair, in NAD CardioL RRR no m/r/g, RLE 2+ pulses, warm Respiratory: CTAB, no wheezing, rhonchi, crackles Abdomen: Soft, non-tender, non-distended, + bowel sounds Extremities: L BKA with compression     Brief Hospital Course:   [name] a 57 y.o.malepresenting with left ankle wound and LE swelling. PMH is significant forT2DM, HTN,PVD,gout, and tobacco use.  L calcaneal Osteomyelitis:  Upon presentation he stated that he has been experiencing some pain in the LLE since ORIF of L ankle >1 yr ago, but stated that it had worsened in the two weeks prior to admission.  On presentation the left ankle/foot was swollen, erythematous, tender to touch from toes to knees with serosanguinous and mucopurulent secretions from open wound at L medial ankle.  He received a course of vancomycin and zosyn from 7/2-7/6.  MRI on 7/3 showed chronic complex osteomyelitis involving calcaneus and draining medially into large soft tissue abscess extending to open wound.  [name] recommended a trans-tibial amputation, and was performed on 7/5.  Antibiotics were continued 24hrs post surgery. He has remained afebrile and medically stable. The patient's pain continued to improved  throughout the hospital course, and he was discharged to inpatient rehab on Oxycodone 5-10mg  every four hours as needed for pain.  T2DM, uncontrolled:  Patient reported taking Lantus daily at home, (had not been taking Novolog with meals) as well as metformin 2g daily.  His A1C was 11.4 on admission.  He admitted drinking a large amount of Kool Aid at home and it was believed that this was a contributing factor. Throughout the admission, CBGs were monitored, he was on sSSI and Lantus was added, with continued improvement of glucose control.  On discharge, patient on sSSI and Lantus 14 units QD.  Will need continued diabetes follow up as outpatient.  Anemia:  On admission, patient's Hgb was 7.4.  He was transfused 1 unit pRBC and it corrected to 8.2.  The day before surgery, the patient's Hgb was 7.8 and the decision was made to transfuse 1 unit pRBC, with consideration for pending blood loss in surgery as well as his anemia.  His Hgb continued to remain stable and was 9.4 on 7/8. It was recommended to continue Iron supplementation when patient out of immediate post-operative period.  AKI, improved:  Baseline Cr 0.6-0.8, was 0.78 on admission. Creatinine increased to 1.26 on 7/3 and 1.61 on 7/4, patient was started on IVF and it comtinued to improve to Cr 1.09 on 7/8 and was stable at discharge.       Issues for Follow Up:  1. Patient needs follow up with GI for colonscopy. 2. Follow up with PCP for Diabetes control. 3. Recheck Hgb in 1 month fow follow up of anemia. 4. Patient agreed to stop smoking and stated that he did not need a nicotine patch as he has not used one while in the hospital.  Please  follow up on smoking status and counseling. 5. Please recommend appropriate time to begin iron supplementation as it was recommended for the patient's iron deficiency anemia. 6. Recommend follow up BMP in 1 month, re-assess Cr.  Significant Procedures: 7/5 L BKA, 2 units pRBC  transfused  Significant Labs and Imaging:  Recent Labs  Lab 10/06/17 0641 10/07/17 0650 10/08/17 0537  WBC 8.4 6.9 6.8  HGB 9.3* 9.5* 9.4*  HCT 29.5* 30.6* 30.2*  PLT 688* 629* 653*   Recent Labs  Lab 10/04/17 1052 10/05/17 1329 10/06/17 0641 10/07/17 0650 10/08/17 0537 10/08/17 0922  NA 131* 138 135 133* 135  --   K 4.2 5.3* 4.7 5.1 5.0  --   CL 99 109 101 101 103  --   CO2 25 21* 27 22 26   --   GLUCOSE 244* 189* 195* 194* 245*  --   BUN 17 10 10 14 17   --   CREATININE 1.61* 1.12 1.09 0.98 1.09 1.09  CALCIUM 8.1* 8.5* 9.0 8.9 8.9  --   ALKPHOS 120 87 99 92 133*  --   AST 25 18 26 23 21   --   ALT 31 QUANTITY NOT SUFFICIENT, UNABLE TO PERFORM TEST 22 21 18   --   ALBUMIN 2.0* 2.1* 2.4* 2.3* 2.3*  --     Dg Ankle Complete Left  Result Date: 10/02/2017 CLINICAL DATA:  Swelling) none drainage from LEFT ankle, abscess, history hardware EXAM: LEFT ANKLE COMPLETE - 3+ VIEW COMPARISON:  01/21/2015 FINDINGS: Diffuse osseous demineralization. Single screw at distal tibia. Advanced degenerative changes of the ankle joint with irregular joint space narrowing and significant marginal spur formation. Posttraumatic deformities of the distal tibia and fibula. Plantar calcaneal spur. Chronic sclerosis at the calcaneus. No acute fracture, dislocation or bone destruction. Scattered soft tissue swelling. IMPRESSION: Extensive posttraumatic and degenerative changes of the LEFT ankle as above. No definite acute bony abnormalities. If there is persistent clinical concern for osteomyelitis, recommend MR. Electronically Signed   By: [name] M.D.   On: 10/02/2017 18:10   Mr Ankle Left W Wo Contrast  Result Date: 10/03/2017 CLINICAL DATA:  Chronic draining heel ulcer. EXAM: MRI OF THE LEFT ANKLE WITHOUT AND WITH CONTRAST TECHNIQUE: Multiplanar, multisequence MR imaging of the ankle was performed before and after the administration of intravenous contrast. CONTRAST:  34mL MULTIHANCE GADOBENATE  DIMEGLUMINE 529 MG/ML IV SOLN COMPARISON:  Radiograph 10/02/2017 FINDINGS: Remote posttraumatic changes involving the ankle with severe posttraumatic arthritis with marked joint space narrowing and osteophytic spurring. Large os trigonum noted. There is a single trans tibial screw noted. There is a large draining soft tissue abscess involving the medial aspect of the calcaneus extending to the bone with there are chronic appearing changes of osteomyelitis with numerous interconnected bone abscesses and areas of sclerotic/did bone. No definite findings for septic arthritis involving the talocalcaneal joints. No other areas of osteomyelitis are identified. Extensive myofasciitis involving the short flexor muscles. There is also evidence of pyomyositis proximally measuring a maximum of 13.5 mm. IMPRESSION: IMPRESSION 1. 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 2. No definite findings for septic arthritis involving the tibiotalar or subtalar joints. 3. Myofasciitis involving the short flexor muscles with evidence of proximal pyomyositis. Electronically Signed   By: Marijo Sanes M.D.   On: 10/03/2017 20:49   US Venous Img Lower Unilateral Left  Result Date: 09/27/2017 CLINICAL DATA:  Edema x8 months EXAM:  LEFT LOWER EXTREMITY VENOUS DOPPLER ULTRASOUND TECHNIQUE: Gray-scale sonography with compression, as well as color and duplex ultrasound, were performed to evaluate the deep venous system from the level of the common femoral vein through the popliteal and proximal calf veins. COMPARISON:  01/14/2017 FINDINGS: Normal compressibility of the common femoral, superficial femoral, and popliteal veins, as well as the proximal calf veins. No filling defects to suggest DVT on grayscale or color Doppler imaging. Doppler waveforms show normal direction of venous flow, normal respiratory phasicity and response to augmentation.  Prominent inguinal lymph nodes. Subcutaneous edema in the lower leg. Survey views of the contralateral common femoral vein are unremarkable. IMPRESSION: No evidence of left lower extremity deep vein thrombosis. Electronically Signed   By: Lucrezia Europe M.D.   On: 09/27/2017 12:16    Results/Tests Pending at Time of Discharge: None   Discharge Medications:  Allergies as of 10/09/2017   No Known Allergies     Medication List    STOP taking these medications   accu-chek multiclix lancets   amitriptyline 25 MG tablet Commonly known as:  ELAVIL   colchicine 0.6 MG tablet   diclofenac 75 MG EC tablet Commonly known as:  VOLTAREN   etodolac 200 MG capsule Commonly known as:  LODINE   furosemide 40 MG tablet Commonly known as:  LASIX   glucose blood test strip   HYDROcodone-acetaminophen 5-325 MG tablet Commonly known as:  NORCO   ibuprofen 800 MG tablet Commonly known as:  ADVIL,MOTRIN   Insulin Syringe-Needle U-100 25G X 1" 1 ML Misc   metFORMIN 500 MG 24 hr tablet Commonly known as:  GLUCOPHAGE-XR   traMADol 50 MG tablet Commonly known as:  ULTRAM     TAKE these medications   acetaminophen 325 MG tablet Commonly known as:  TYLENOL Take 2 tablets (650 mg total) by mouth every 6 (six) hours.   amLODipine 10 MG tablet Commonly known as:  NORVASC Take 10 mg by mouth daily.   aspirin 81 MG chewable tablet Chew 81 mg by mouth daily.   gabapentin 300 MG capsule Commonly known as:  NEURONTIN Take 1 capsule (300 mg total) by mouth 3 (three) times daily. What changed:    medication strength  how much to take   insulin aspart 100 UNIT/ML injection Commonly known as:  novoLOG Inject 0-15 Units into the skin 3 (three) times daily with meals. What changed:    how much to take  additional instructions   insulin glargine 100 UNIT/ML injection Commonly known as:  LANTUS Inject 0.14 mLs (14 Units total) into the skin daily. Start taking on:  10/10/2017 What changed:   how much to take   lisinopril 40 MG tablet Commonly known as:  PRINIVIL,ZESTRIL Take 40 mg by mouth daily.   Melatonin 3 MG Tabs Take 1.5 tablets (4.5 mg total) by mouth at bedtime as needed (sleep).   metoprolol succinate 25 MG 24 hr tablet Commonly known as:  TOPROL-XL Take 25 mg by mouth daily.   oxyCODONE 5 MG immediate release tablet Commonly known as:  Oxy IR/ROXICODONE Take 1-2 tablets (5-10 mg total) by mouth every 4 (four) hours as needed for moderate pain (pain score 4-6).   pantoprazole 40 MG tablet Commonly known as:  PROTONIX Take 1 tablet (40 mg total) by mouth daily. Start taking on:  10/10/2017   polyethylene glycol packet Commonly known as:  MIRALAX / GLYCOLAX Take 17 g by mouth daily. Start taking on:  10/10/2017  Discharge Instructions: Please refer to Patient Instructions section of EMR for full details.  Patient was counseled important signs and symptoms that should prompt return to medical care, changes in medications, dietary instructions, activity restrictions, and follow up appointments.   Follow-Up Appointments: Follow-up Information    [name], MD In 1 week.   Specialty:  Orthopedic Surgery Contact information: [place] [address] [phone]           Gregery Na, DO 10/09/2017, 12:38 PM PGY-1, [facility]
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
## 2017-10-09 NOTE — Progress Notes (Signed)
Inpatient Diabetes Program Recommendations  AACE/ADA: New Consensus Statement on Inpatient Glycemic Control (2019)  Target Ranges:  Prepandial:   less than 140 mg/dL      Peak postprandial:   less than 180 mg/dL (1-2 hours)      Critically ill patients:  140 - 180 mg/dL   Results for [name], [name] (MRN [id number]) as of 10/09/2017 10:12  Ref. Range 10/08/2017 08:41 10/08/2017 12:03 10/08/2017 17:24 10/08/2017 21:45 10/09/2017 07:54  Glucose-Capillary Latest Ref Range: 70 - 99 mg/dL 232 (H) 253 (H) 189 (H) 262 (H) 212 (H)    Review of Glycemic Control Diabetes history: DM2 Outpatient Diabetes medications: Lantus 70 units daily, Novolog 5 units TID with meals (not taking per chart), Metformin XR 2000 mg QAM Current orders for Inpatient glycemic control: Novolog 0-15 units TID with meals, Lantus 10 units daily  Inpatient Diabetes Program Recommendations: Correction (SSI): Bedtime glucose 262 mg/dl and no insulin given since no bedtime correction ordered. Please consider ordering Novolog 0-5 units QHS for bedtime correction. Insulin - Meal Coverage: Please consider ordering Novolog 4 units TID with meals for meal coverage if patient eats at least 50% of meals. HgbA1C: A1C 11.4% on 10/02/2017 indicating an average glucose of 280 mg/dl over the past 2-3 months. Patient was seen by R.Vaughan, RD, CDE, Inpatient Diabetes Coordinator on 10/04/2017 regarding A1C.  Thanks, Barnie Alderman, RN, MSN, CDE Diabetes Coordinator Inpatient Diabetes Program [phone] (Team Pager from 8am to 5pm)

## 2017-10-09 NOTE — Progress Notes (Signed)
PMR Admission Coordinator Pre-Admission Assessment  Patient: [name] is an 58 y.o., male MRN: [id number] DOB: 1960-04-24 Height: 5\' 7"  (170.2 cm) Weight: 102.5 kg (226 lb)                                                                                                                                                  Insurance Information HMO:     PPO:      PCP:      IPA:      80/20:      OTHER: Yes  PRIMARY: Medicaid [address]/Medicaid [place] Access      Policy#: [id number]      Subscriber: Patient CM Name:       Phone#:      Fax#:  Pre-Cert#:       Employer:  Benefits: Eligible verified on 10/08/17 Phone #: via automated system [phone]    Coverage Code: MADCY Eff. Date: checked on 10/08/17     Deduct: NA      Out of Pocket Max: NA      Life Max: NA CIR: Covered per Medicaid Guidelines      SNF:  Outpatient:      Co-Pay:  Home Health:       Co-Pay:  DME:      Co-Pay:  Providers:   Medicaid Application Date:       Case Manager:  Disability Application Date:       Case Worker:   Emergency Contact Information         Contact Information    Name Relation Home Work Mobile   [place], [place] Son   [phone]   [place], [place] Daughter   [phone]     Current Medical History  Patient Admitting Diagnosis: Left BKA  History of Present Illness: [name] is a 57 year old right-handed male with history of hypertension, uncontrolled diabetes mellitus, tobacco abuse, left ankle fracture with ORIF 1 year ago, peripheral vascular disease with revascularization procedures. Per chart review and patient, lives with son and daughter in [place]. Independent with standard walker prior to admission. One level home 3 steps to entry. Per son and pt, son does not work during the day and can be of assistance 24/7. No other local family. 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 Underwent left transtibial amputation 10/05/2017 per [name] and wound VAC applied. Hospital course pain management. Acute blood loss anemia 9.4 and monitored. Hemoglobin A1c 11.4 with insulin therapy as directed. Physical and occupational therapy evaluations completed with recommendations of physical medicine rehab consult. Patient is to be admitted for a comprehensive rehab program on 10/09/17.     Past Medical History      Past Medical History:  Diagnosis Date  .  Diabetes mellitus without complication ([place])   . Gout   . Hyperlipidemia   . Hypertension   . Insomnia     Family History  family history includes Diabetes in his mother.  Prior Rehab/Hospitalizations:  Has the patient had major surgery during 100 days prior to admission? No  Current Medications   Current Facility-Administered Medications:  .  0.9 %  sodium chloride infusion, , Intravenous, Continuous, [name], MD, Stopped at 10/05/17 1353 .  acetaminophen (TYLENOL) tablet 325-650 mg, 325-650 mg, Oral, Q6H PRN, [name], MD, 650 mg at 10/07/17 1515 .  acetaminophen (TYLENOL) tablet 650 mg, 650 mg, Oral, Q6H, [name], MD, 650 mg at 10/09/17 1222 .  amLODipine (NORVASC) tablet 10 mg, 10 mg, Oral, Daily, [name], MD, 10 mg at 10/09/17 1056 .  aspirin EC tablet 81 mg, 81 mg, Oral, Daily, [name], MD, 81 mg at 10/09/17 1056 .  bisacodyl (DULCOLAX) suppository 10 mg, 10 mg, Rectal, Daily PRN, [name], MD .  docusate sodium (COLACE) capsule 100 mg, 100 mg, Oral, BID, [name], MD, 100 mg at 10/09/17 1056 .  gabapentin (NEURONTIN) capsule 300 mg, 300 mg, Oral, TID, [name], MD, 300 mg at 10/09/17 1056 .  insulin aspart (novoLOG) injection 0-15 Units, 0-15 Units, Subcutaneous, TID WC, Winfrey, Alcario Drought, MD, 8 Units at 10/09/17 1221 .  [START ON 10/10/2017] insulin glargine (LANTUS)  injection 14 Units, 14 Units, Subcutaneous, Daily, Riccio, Angela C, DO .  lactated ringers infusion, , Intravenous, Continuous, [name], MD, Last Rate: 10 mL/hr at 10/05/17 1019 .  Melatonin TABS 4.5 mg, 4.5 mg, Oral, QHS PRN, [name], MD, 4.5 mg at 10/07/17 2348 .  methocarbamol (ROBAXIN) tablet 500 mg, 500 mg, Oral, Q6H PRN, 500 mg at 10/09/17 0205 **OR** methocarbamol (ROBAXIN) 500 mg in dextrose 5 % 50 mL IVPB, 500 mg, Intravenous, Q6H PRN, [name], MD .  metoCLOPramide (REGLAN) tablet 5-10 mg, 5-10 mg, Oral, Q8H PRN **OR** metoCLOPramide (REGLAN) injection 5-10 mg, 5-10 mg, Intravenous, Q8H PRN, [name], MD .  metoprolol succinate (TOPROL-XL) 24 hr tablet 25 mg, 25 mg, Oral, Daily, [name], MD, 25 mg at 10/09/17 1057 .  nutrition supplement (JUVEN) (JUVEN) powder packet 1 packet, 1 packet, Oral, BID BM, [name], MD, 1 packet at 10/09/17 1056 .  ondansetron (ZOFRAN) tablet 4 mg, 4 mg, Oral, Q6H PRN **OR** ondansetron (ZOFRAN) injection 4 mg, 4 mg, Intravenous, Q6H PRN, [name], MD .  oxyCODONE (Oxy IR/ROXICODONE) immediate release tablet 5-10 mg, 5-10 mg, Oral, Q4H PRN, [name], MD, 10 mg at 10/09/17 3818 .  pantoprazole (PROTONIX) EC tablet 40 mg, 40 mg, Oral, Daily, [name], MD, 40 mg at 10/09/17 1056 .  polyethylene glycol (MIRALAX / GLYCOLAX) packet 17 g, 17 g, Oral, Daily, [name], MD, 17 g at 10/09/17 1056 .  polyethylene glycol (MIRALAX / GLYCOLAX) packet 17 g, 17 g, Oral, Daily PRN, [name], MD  Patients Current Diet:       Diet Order           Diet Carb Modified Fluid consistency: Thin; Room service appropriate? Yes  Diet effective now          Precautions / Restrictions Precautions Precautions: Fall Precaution Comments: wound VAC Other Brace/Splint: L leg limb guard Restrictions Weight Bearing Restrictions: Yes LLE Weight Bearing: Non weight bearing   Has  the patient had 2 or more falls  or a fall with injury in the past year?No  Prior Activity Level Limited Community (1-2x/wk): family would take him to run errands  Development worker, international aid / [place] Devices/Equipment: CBG Meter, Environmental consultant (specify type), Cane (specify quad or straight) Home Equipment: Walker - standard  Prior Device Use: Indicate devices/aids used by the patient prior to current illness, exacerbation or injury? Walker  Prior Functional Level Prior Function Level of Independence: Independent with assistive device(s) Comments: ambulated with standard walker due to pain  Self Care: Did the patient need help bathing, dressing, using the toilet or eating?  Independent  Indoor Mobility: Did the patient need assistance with walking from room to room (with or without device)? Independent  Stairs: Did the patient need assistance with internal or external stairs (with or without device)? Independent  Functional Cognition: Did the patient need help planning regular tasks such as shopping or remembering to take medications? Independent  Current Functional Level Cognition  Overall Cognitive Status: Within Functional Limits for tasks assessed Orientation Level: Oriented X4 General Comments: Pt pleasant throughout session.     Extremity Assessment (includes Sensation/Coordination)  Upper Extremity Assessment: Overall WFL for tasks assessed  Lower Extremity Assessment: LLE deficits/detail LLE Deficits / Details: Assisted pt to don limb protector for transfer. He is s/p L transtibial amputation.     ADLs  Overall ADL's : Needs assistance/impaired Eating/Feeding: Set up, Sitting Grooming: Set up, Oral care, Wash/dry face, Sitting Upper Body Bathing: Set up, Sitting Lower Body Bathing: Minimal assistance, Sitting/lateral leans Upper Body Dressing : Set up, Sitting Lower Body Dressing: Sitting/lateral leans, Moderate assistance Lower Body Dressing Details (indicate cue type and  reason): including limb protector Toilet Transfer: Supervision/safety, Anterior/posterior Toilet Transfer Details (indicate cue type and reason): Pt with significant pain and deferred attempted stand-pivot today. Able to complete anterior-posterior transfer with supervision.  Toileting- Clothing Manipulation and Hygiene: Moderate assistance, Sitting/lateral lean General ADL Comments: Pt able to sit at EOB for grooming tasks prior to anterior-posterior transfer to recliner chair. Assisted to don limb protector and pt verbalizes understanding of its benefits.     Mobility  Overal bed mobility: Needs Assistance Bed Mobility: Sit to Supine Supine to sit: HOB elevated, Supervision Sit to supine: Supervision General bed mobility comments: supervision for safety as pt used momentum and gravity to help lower trunk and lift legs back into bed.     Transfers  Overall transfer level: Needs assistance Equipment used: Rolling walker (2 wheeled) Transfers: Sit to/from Stand, Stand Pivot Transfers Sit to Stand: +2 physical assistance, Min assist Stand pivot transfers: +2 physical assistance, Min assist Anterior-Posterior transfers: Supervision General transfer comment: Two person min assist with RW, verbal cues for safe hand placement, pt needed stability at trunk and RW during movement, and he was only able to pivot on his right foot to the right side.  He was unable to hop and tried to sit prematurely before he was turned all the way around to the bed.  Uncontrolled descent to sit down.     Ambulation / Gait / Stairs / Wheelchair Mobility  Ambulation/Gait General Gait Details: unable at this time.     Posture / Balance Dynamic Sitting Balance Sitting balance - Comments: Some posterior LOB noted at EOB but able to self-correct.  Balance Overall balance assessment: Needs assistance Sitting-balance support: Feet supported, Bilateral upper extremity supported, No upper extremity  supported Sitting balance-Leahy Scale: Fair Sitting balance - Comments: Some posterior LOB  noted at EOB but able to self-correct.  Standing balance support: Bilateral upper extremity supported Standing balance-Leahy Scale: Poor Standing balance comment: reliant on bilateral UEs for balance    Special needs/care consideration BiPAP/CPAP: No CPM: No Continuous Drip IV: No Dialysis: No        Days: NA Life Vest: No Oxygen: No Special Bed: No Trach Size: No Wound Vac (area): Yes to surgical incision area on LLE       Skin: LLE incision area                              Location Bowel mgmt:: Last BM 10/08/17 Bladder mgmt: continent  Diabetic mgmt: yes, injections per pt report      Previous Home Environment Living Arrangements: Children Available Help at Discharge: Family, Available 24 hours/day Type of Home: House Home Layout: One level Home Access: Stairs to enter Entrance Stairs-Rails: Right, Left Entrance Stairs-Number of Steps: 3 Bathroom Shower/Tub: Chiropodist: Standard Home Care Services: No  Discharge Living Setting Plans for Discharge Living Setting: Patient's home, Lives with (comment)(lives in his son's home with son and dtr-in-law) Type of Home at Discharge: House Discharge Home Layout: One level Discharge Home Access: Stairs to enter Entrance Stairs-Rails: Can reach both Entrance Stairs-Number of Steps: 3 Discharge Bathroom Shower/Tub: Tub/shower unit Discharge Bathroom Toilet: Standard Discharge Bathroom Accessibility: Yes How Accessible: Accessible via walker, Other (comment)(possibly walker) Does the patient have any problems obtaining your medications?: No  Social/Family/Support Systems Patient Roles: Parent(pts son and daugther in law would take care of him) Contact Information: son: Cloy [place]): [phone] Anticipated Caregiver: son and daugther in law Anticipated Caregiver's Contact Information: see  above Ability/Limitations of Caregiver: son can be there 24/7; however family would like input on getting him new handicapped apartment with live in roommate.  Caregiver Availability: 24/7 Discharge Plan Discussed with Primary Caregiver: Yes Is Caregiver In Agreement with Plan?: Yes Does Caregiver/Family have Issues with Lodging/Transportation while Pt is in Rehab?: No   Goals/Additional Needs Patient/Family Goal for Rehab: PT/OT: Mod I/Supervision; SLP: NA Expected length of stay: 4-8 days Cultural Considerations: NA Dietary Needs: Carb modified diet (1600-2000 cal); thin liquids;  Equipment Needs: TBD Special Service Needs: NA Additional Information: NA Pt/Family Agrees to Admission and willing to participate: Yes Program Orientation Provided & Reviewed with Pt/Caregiver Including Roles  & Responsibilities: Yes(reviewed with pt, son, and daugther in [employer] [place]) Additional Information Needs: TBD  Barriers to Discharge: Home environment access/layout, Weight bearing restrictions  Barriers to Discharge Comments: new amputation; 3 steps to enter home   Decrease burden of Care through IP rehab admission: NA   Possible need for SNF placement upon discharge: Not anticipated    Patient Condition: This patient's medical and functional status has changed since the consult dated: 10/08/17 in which the Rehabilitation Physician determined and documented that the patient's condition is appropriate for intensive rehabilitative care in an inpatient rehabilitation facility. Medical changes are: improved activity tolerance based off of report form mobility aide (pt able to take hop steps) as well as based off of improved pain control per pt report.  Functional changes are: Min Ax2 for tranfers. After evaluating the patient today and speaking with the Rehabilitation physician and acute team, the patient remains appropriate for inpatient rehab. Will admit to inpatient rehab today.  Preadmission Screen  Completed By:  Jhonnie Garner, 10/09/2017 12:25 PM ______________________________________________________________________   Discussed status with Dr. Naaman Plummer on 10/09/17 at  12:25PM and received telephone approval for admission today.  Admission Coordinator:  Jhonnie Garner, time 12:25PM/Date 10/09/17             Cosigned by: Meredith Staggers, MD at 10/09/2017 12:50 PM  Revision History

## 2017-10-09 NOTE — Progress Notes (Signed)
Family Medicine Teaching Service Daily Progress Note Intern Pager: [phone]  Patient name: [name] Medical record number: [id number] Date of birth: Mar 08, 1961 Age: 57 y.o. Gender: male  Primary Care Provider: Vidal Schwalbe, MD Consultants: ortho surg Code Status: fll  [name]:  7/2 admission 7/5 Left transtibial amputation  Assessment and Plan: [name] is a 57 y.o. male presenting with left ankle wound and LE swelling. PMH is significant for T2DM, HTN, PVD, gout, and tobacco use.  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  Patient continues to complain of pain in LLE, rates in 9/10 at first, then states "maybe it's less than that."  Does not appear to be in acute distress.  Was due for a dose of pain medication.  Pain managed with scheduled tylenol, oxycodone 10 Q4H PRN for pain only, dilaudid d/c'ed on 7/8.  Afebrile overnight. CIR has bed for patient and will be discharged from our service today. - Cont Oxycodone to 10mg  q4 prn pain - f/u  blood Cx x 2 - both no growth to date - per ortho recs, discontinued vanc and zosyn on 7/6 - cont tylenol 650mg  PO q6 hrs - monitor pain closely - Miralax currently QD; Mag citrate prn, monitor for constipation  T2DM, uncontrolled: Takes Lantus daily, (has not been taking Novolog with meals) as well as metformin 2 g daily.  A1C 11.4.  CBGs in upper 100s to upper 200s over the past 24 hours.  Received 16 units Novolog on 7/8 and 10 units Lantus. CBG this AM 212. - change to moderate insulin sliding scale TID AC and QHS since sugar control will be helpful for healing - Increase Lantus to 14u QD   Anemia: Has received 2 units pRBCs during admission.  Hgb 9.4 on 7/8.  - After infection is resolved, should begin iron supplementation; will wait until patient is out of immediate post op period to do this - Refer for Outpatient colonoscopy - Pt consents to blood transfusion as necessary - continue  to monitor CBC q48hrs  AKI, improved: Baseline Cr 0.6-0.8, was 0.78 on admission.  1.26 on 7/3 and 1.61 on 7/4, on 7/8 Cr 1.09.   - Continue to monitor with BMP q48hrs - Not on IVF  HTN: BP on admission 142/77 on 7/6, takes amlodipine 10 mg daily, lisinopril 40 mg daily, and Toprol XL 25 mg daily. 115/67  this AM. - Continue BP regimen from home   Electrolyte Abnormalities, resolved: hyponatremia noted on past labs and patient is asymptomatic. Labs on admit Na 130 L, Cl 94 L, Ca 8.8 L.  Chronic issue, this is near baseline.  Electrolytes WNL on 7/8. - Not on IVF - continue to monitor BMP q48hrs  Tobacco use: smokes 1 ppd, has been smoking for 50 years. Declines nicotine patch. - Nicotine patch per patient request  Constipation: Resolved.  Had large BM on 7/3 with no frank blood.  Nurse did not test BM. Patient complaining of constipation and would like more help with BM.  Mag Citrate in chart prn. Patient had a BM on 7/8. - Continue Miralax to QD - continue to monitor  FEN/GI: Carb modified, maintenance fluids Prophylaxis: SCD on R leg  Disposition: CIR  Subjective:  Patient states that he is feeling well this AM.  States that pain is well managed with his pain medication.  Denies CP/SOB.  Notes BM yesterday.  Objective: Temp:  [97.6 F (36.4 C)-98.4 F (36.9 C)] 98.2 F (36.8 C) (  07/09 0506) Pulse Rate:  [60-65] 63 (07/09 1055) Resp:  [16-22] 16 (07/09 0506) BP: (115-138)/(67-78) 115/72 (07/09 1055) SpO2:  [98 %-100 %] 99 % (07/09 0506)    Physical Exam: General: 57yo male, sitting up in chair, in NAD CardioL RRR no m/r/g, RLE 2+ pulses, warm Respiratory: CTAB, no wheezing, rhonchi, crackles Abdomen: Soft, non-tender, non-distended, + bowel sounds Extremities: L BKA with compression     Laboratory: Recent Labs  Lab 10/06/17 0641 10/07/17 0650 10/08/17 0537  WBC 8.4 6.9 6.8  HGB 9.3* 9.5* 9.4*  HCT 29.5* 30.6* 30.2*  PLT 688* 629* 653*   Recent Labs   Lab 10/06/17 0641 10/07/17 0650 10/08/17 0537 10/08/17 0922  NA 135 133* 135  --   K 4.7 5.1 5.0  --   CL 101 101 103  --   CO2 27 22 26   --   BUN 10 14 17   --   CREATININE 1.09 0.98 1.09 1.09  CALCIUM 9.0 8.9 8.9  --   PROT 9.1* 8.9* 8.0  --   BILITOT 0.6 0.4 0.2*  --   ALKPHOS 99 92 133*  --   ALT 22 21 18   --   AST 26 23 21   --   GLUCOSE 195* 194* 245*  --     Imaging/Diagnostic Tests: Dg Ankle Complete Left  Result Date: 10/02/2017 CLINICAL DATA:  Swelling) none drainage from LEFT ankle, abscess, history hardware EXAM: LEFT ANKLE COMPLETE - 3+ VIEW COMPARISON:  01/21/2015 FINDINGS: Diffuse osseous demineralization. Single screw at distal tibia. Advanced degenerative changes of the ankle joint with irregular joint space narrowing and significant marginal spur formation. Posttraumatic deformities of the distal tibia and fibula. Plantar calcaneal spur. Chronic sclerosis at the calcaneus. No acute fracture, dislocation or bone destruction. Scattered soft tissue swelling. IMPRESSION: Extensive posttraumatic and degenerative changes of the LEFT ankle as above. No definite acute bony abnormalities. If there is persistent clinical concern for osteomyelitis, recommend MR. Electronically Signed   By: Lavonia Dana M.D.   On: 10/02/2017 18:10     Rittberger, Bernita Raisin, DO 10/09/2017, 12:14 PM PGY-1, [place] Intern pager: [phone], text pages welcome

## 2017-10-09 NOTE — Progress Notes (Signed)
Inpatient Rehabilitation-Admissions Coordinator   [facility] has received medical approval for admit to CIR today. Floor RN, CM, and SW have been notified of plan. Please call if questions.   Jhonnie Garner, OTR/L  Rehab Admissions Coordinator  [phone] 10/09/2017 1:04 PM

## 2017-10-10 ENCOUNTER — Inpatient Hospital Stay (HOSPITAL_COMMUNITY): Payer: Medicaid Other | Admitting: Physical Therapy

## 2017-10-10 ENCOUNTER — Inpatient Hospital Stay (HOSPITAL_COMMUNITY): Payer: Medicaid Other | Admitting: Occupational Therapy

## 2017-10-10 DIAGNOSIS — D473 Essential (hemorrhagic) thrombocythemia: Secondary | ICD-10-CM

## 2017-10-10 DIAGNOSIS — E1165 Type 2 diabetes mellitus with hyperglycemia: Secondary | ICD-10-CM

## 2017-10-10 DIAGNOSIS — D62 Acute posthemorrhagic anemia: Secondary | ICD-10-CM

## 2017-10-10 DIAGNOSIS — S88112A Complete traumatic amputation at level between knee and ankle, left lower leg, initial encounter: Secondary | ICD-10-CM

## 2017-10-10 DIAGNOSIS — K5903 Drug induced constipation: Secondary | ICD-10-CM

## 2017-10-10 DIAGNOSIS — Z89512 Acquired absence of left leg below knee: Secondary | ICD-10-CM

## 2017-10-10 DIAGNOSIS — D75839 Thrombocytosis, unspecified: Secondary | ICD-10-CM

## 2017-10-10 DIAGNOSIS — I10 Essential (primary) hypertension: Secondary | ICD-10-CM

## 2017-10-10 LAB — CBC WITH DIFFERENTIAL/PLATELET
Basophils Absolute: 0.1 10*3/uL (ref 0.0–0.1)
Basophils Relative: 1 %
Eosinophils Absolute: 0.2 10*3/uL (ref 0.0–0.7)
Eosinophils Relative: 2 %
HCT: 30.8 % — ABNORMAL LOW (ref 39.0–52.0)
Hemoglobin: 9.6 g/dL — ABNORMAL LOW (ref 13.0–17.0)
Lymphocytes Relative: 41 %
Lymphs Abs: 3.2 10*3/uL (ref 0.7–4.0)
MCH: 28.2 pg (ref 26.0–34.0)
MCHC: 31.2 g/dL (ref 30.0–36.0)
MCV: 90.6 fL (ref 78.0–100.0)
Monocytes Absolute: 0.8 10*3/uL (ref 0.1–1.0)
Monocytes Relative: 10 %
Neutro Abs: 3.4 10*3/uL (ref 1.7–7.7)
Neutrophils Relative %: 46 %
Platelets: 580 10*3/uL — ABNORMAL HIGH (ref 150–400)
RBC: 3.4 MIL/uL — ABNORMAL LOW (ref 4.22–5.81)
RDW: 13.5 % (ref 11.5–15.5)
WBC: 7.7 10*3/uL (ref 4.0–10.5)

## 2017-10-10 LAB — COMPREHENSIVE METABOLIC PANEL
ALBUMIN: 2.5 g/dL — AB (ref 3.5–5.0)
ALT: 18 U/L (ref 0–44)
ANION GAP: 8 (ref 5–15)
AST: 17 U/L (ref 15–41)
Alkaline Phosphatase: 84 U/L (ref 38–126)
BILIRUBIN TOTAL: 0.4 mg/dL (ref 0.3–1.2)
BUN: 20 mg/dL (ref 6–20)
CALCIUM: 9.3 mg/dL (ref 8.9–10.3)
CO2: 25 mmol/L (ref 22–32)
CREATININE: 1 mg/dL (ref 0.61–1.24)
Chloride: 102 mmol/L (ref 98–111)
GFR calc non Af Amer: >60 mL/min (ref >60)
GLUCOSE: 223 mg/dL — AB (ref 70–99)
Potassium: 4.9 mmol/L (ref 3.5–5.1)
Sodium: 135 mmol/L (ref 135–145)
Total Protein: 8.2 g/dL — ABNORMAL HIGH (ref 6.5–8.1)

## 2017-10-10 LAB — GLUCOSE, CAPILLARY
GLUCOSE-CAPILLARY: 211 mg/dL — AB (ref 70–99)
GLUCOSE-CAPILLARY: 212 mg/dL — AB (ref 70–99)
Glucose-Capillary: 206 mg/dL — ABNORMAL HIGH (ref 70–99)
Glucose-Capillary: 221 mg/dL — ABNORMAL HIGH (ref 70–99)

## 2017-10-10 MED ORDER — SENNOSIDES-DOCUSATE SODIUM 8.6-50 MG PO TABS
2.0000 | ORAL_TABLET | Freq: Every day | ORAL | Status: DC
  Administered 2017-10-10 – 2017-10-17 (×8): 2 via ORAL
  Filled 2017-10-10 (×9): qty 2

## 2017-10-10 MED ORDER — ADULT MULTIVITAMIN W/MINERALS CH
1.0000 | ORAL_TABLET | Freq: Every day | ORAL | Status: DC
  Administered 2017-10-10 – 2017-10-18 (×9): 1 via ORAL
  Filled 2017-10-10 (×9): qty 1

## 2017-10-10 MED ORDER — PRO-STAT SUGAR FREE PO LIQD
30.0000 mL | Freq: Two times a day (BID) | ORAL | Status: DC
  Administered 2017-10-10 – 2017-10-18 (×16): 30 mL via ORAL
  Filled 2017-10-10 (×17): qty 30

## 2017-10-10 MED ORDER — TRAMADOL HCL 50 MG PO TABS
50.0000 mg | ORAL_TABLET | Freq: Four times a day (QID) | ORAL | Status: DC | PRN
  Administered 2017-10-12 – 2017-10-18 (×11): 50 mg via ORAL
  Filled 2017-10-10 (×10): qty 1

## 2017-10-10 MED ORDER — OXYCODONE HCL 5 MG PO TABS
5.0000 mg | ORAL_TABLET | ORAL | Status: DC | PRN
Start: 2017-10-10 — End: 2017-10-18
  Administered 2017-10-10 – 2017-10-18 (×31): 10 mg via ORAL
  Filled 2017-10-10 (×32): qty 2

## 2017-10-10 MED ORDER — ACETAMINOPHEN 325 MG PO TABS: 650.0000 mg | ORAL_TABLET | Freq: Four times a day (QID) | ORAL | 0 refills | Status: AC

## 2017-10-10 NOTE — Progress Notes (Signed)
Social Work  Social Work Assessment and Plan  Patient Details  Name: [name] MRN: [id number] Date of Birth: 1960-10-21  Today's Date: 10/10/2017  Problem List:  Patient Active Problem List   Diagnosis Date Noted  . Thrombocytosis ([place])   . Constipation due to pain medication   . S/P BKA (below knee amputation) unilateral, left ([place])   . Unilateral complete BKA, left, initial encounter ([place])   . Acute blood loss anemia   . Post-operative pain   . Benign essential HTN   . Tobacco abuse   . S/P amputation   . Subacute osteomyelitis, left ankle and foot ([place])   . Acute osteomyelitis of left calcaneus ([facility])   . Cutaneous abscess of left foot   . Severe protein-calorie malnutrition ([place])   . PAD (peripheral artery disease) ([place])   . Wound infection 10/02/2017  . Type 2 diabetes mellitus treated with insulin ([place])   . Tobacco use disorder   . Lymphedema 05/28/2017  . Essential hypertension 05/28/2017  . Poorly controlled type 2 diabetes mellitus ([place]) 05/28/2017  . DJD (degenerative joint disease) 05/28/2017  . Hyponatremia 04/05/2016  . Anemia 04/05/2016  . Gout 04/05/2016  . Bilateral foot pain 04/04/2016   Past Medical History:  Past Medical History:  Diagnosis Date  . Diabetes mellitus without complication ([place])   . Gout   . Hyperlipidemia   . Hypertension   . Insomnia    Past Surgical History:  Past Surgical History:  Procedure Laterality Date  . AMPUTATION Left 10/05/2017   Procedure: LEFT BELOW KNEE AMPUTATION;  Surgeon: [name], MD;  Location: [facility];  Service: Orthopedics;  Laterality: Left;  . JOINT REPLACEMENT     right knee  . LEG SURGERY     Social History:  reports that he has been smoking cigarettes.  He has a 50.00 pack-year smoking history. He has never used smokeless tobacco. He reports that he drinks about 2.4 oz of alcohol per week. He reports that he does not use drugs.  Family / Support Systems Marital Status: Single Patient  Roles: Parent Children: Lakyn-son [phone] Other Supports: Melissa-daughter [phone] Anticipated Caregiver: Son and daughter in-law Ability/Limitations of Caregiver: Son doesn't work but would like resources for getting a handicapped apartment for pt Caregiver Availability: 24/7 Family Dynamics: Close knit family who are involved and supportive. Both son and daughter will make sure pt has what he needs and provide assistance. He has friends who are supportive and visit him.  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 He wants to be as independent as possible since he does not want to be a burden on his son. He feels he can get used to using a wheelchair and walker. He is one who will adapt and do waht he has to do. Recent Psychosocial Issues: Other health issues been dealing with for a year Pyschiatric History: No issues deferred depression screen due to doing well and able to verbalize his concerns and feelings regarding the loss of his leg.  Substance Abuse History: Tobacco  aware needs to quit but will take time. Aware of the resources available to him. Will continue to work on while here  Patient / Family Perceptions, Expectations & Goals Pt/Family understanding of illness & functional limitations: Pt and son can explain his amputation and feel somewhat relieved it is over and now his healing  can begin. He has been dealing with this issue for sometime now. Both talk with the MD and feel they have a good understanding of his plan going forward. Premorbid pt/family roles/activities: Father, retiree, church member, friend, etc Anticipated changes in roles/activities/participation: resume Pt/family expectations/goals: Pt states: " I plan to be independent when I leave here, don;t want to depend upon others."  Son states: " I hope he is moving aorund on his own, but can help too."  [employer]: None Premorbid Home Care/DME Agencies: Other (Comment)(using a walker pTA) Transportation available at discharge: Family Resource referrals recommended: Support group (specify)  Discharge Planning Living Arrangements: Children Support Systems: Children, Other relatives, Friends/neighbors Type of Residence: Private residence Insurance Resources: [employer] (specify county) Museum/gallery curator Resources: [employer] Screen Referred: Previously completed Living Expenses: Lives with family Money Management: Family Does the patient have any problems obtaining your medications?: No Home Management: Son and daughter in-law Patient/Family Preliminary Plans: Return home with son and his wife who can provide assist if needed. Pt would like to get an apartment on his own and be more independent they he is. Discussed housing authority and resources for this. He will need to pursue once discharged from the hospital. Awaiting team evaluaitons and will work on discharge needs. Social Work Anticipated Follow Up Needs: HH/OP, Support Group  Clinical Impression Pleasant gentleman who is motivated to do well and recover form this amputation. He is willing to work hard and get as independent as possible before going home with his son and daughter in-law. Will await therapy evaluations and work on a safe plan.  Elease Hashimoto 10/10/2017, 12:05 PM

## 2017-10-10 NOTE — Progress Notes (Signed)
Physical Therapy Session Note  Patient Details  Name: [name] MRN: [id number] Date of Birth: 1960/11/06  Today's Date: 10/10/2017 PT Individual Time: 1300-1400 PT Individual Time Calculation (min): 60 min   Short Term Goals: Week 1:  PT Short Term Goal 1 (Week 1): =LTG due to estimated LOS  Skilled Therapeutic Interventions/Progress Updates: Pt received seated in bed, denies pain and agreeable to treatment. Supine>sit with S. Squat pivot transfer min guard to w/c. W/c propulsion x150' with BUE and S; slow speed. Gait trial with standard walker with minA, w/c follow x5' with cues for technique. Stand pivot w/c <>mat table with walker and minA, cues for technique to manage walker. BLE long arc quads, straight leg raise, bridging on bolster, crunches x15 reps. Pt dons L limb guard with minA, cues for technique and managing wound vac. Sit <>stand x4 reps from mat table with min guard; cues for R hand reaching back for seat/mat prior to sitting. Educated pt on peer support availability; declines at this time but encouraged pt to consider. Returned to room totalA; remained seated in w/c at end of session, lap belt intact and RN alerted to need for wall box to attach belt alarm.   Therapy Documentation Precautions:  Precautions Precautions: Fall Precaution Comments: wound VAC Required Braces or Orthoses: Other Brace/Splint Other Brace/Splint: L leg limb guard Restrictions Weight Bearing Restrictions: Yes LLE Weight Bearing: Non weight bearing   See Function Navigator for Current Functional Status.   Therapy/Group: Individual Therapy  [name] 10/10/2017, 2:04 PM
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
## 2017-10-10 NOTE — Patient Care Conference (Signed)
Inpatient RehabilitationTeam Conference and Plan of Care Update Date: 10/10/2017   Time: 2:20 PM    Patient Name: [name]      Medical Record Number: [id number]  Date of Birth: June 19, 1960 Sex: Male         Room/Bed: 4W01C/4W01C-01 Payor Info: Payor: MEDICAID [place] / Plan: MEDICAID [place] ACCESS / Product Type: *No Product type* /    Admitting Diagnosis: L  BKA  Admit Date/Time:  10/09/2017  2:57 PM Admission Comments: No comment available   Primary Diagnosis:  <principal problem not specified> Principal Problem: <principal problem not specified>  Patient Active Problem List   Diagnosis Date Noted  . Thrombocytosis ([place])   . Constipation due to pain medication   . S/P BKA (below knee amputation) unilateral, left ([place])   . Unilateral complete BKA, left, initial encounter ([place])   . Acute blood loss anemia   . Post-operative pain   . Benign essential HTN   . Tobacco abuse   . S/P amputation   . Subacute osteomyelitis, left ankle and foot ([place])   . Acute osteomyelitis of left calcaneus (H[facility])   . Cutaneous abscess of left foot   . Severe protein-calorie malnutrition ([place])   . PAD (peripheral artery disease) ([place])   . Wound infection 10/02/2017  . Type 2 diabetes mellitus treated with insulin ([place])   . Tobacco use disorder   . Lymphedema 05/28/2017  . Essential hypertension 05/28/2017  . Poorly controlled type 2 diabetes mellitus ([place]) 05/28/2017  . DJD (degenerative joint disease) 05/28/2017  . Hyponatremia 04/05/2016  . Anemia 04/05/2016  . Gout 04/05/2016  . Bilateral foot pain 04/04/2016    Expected Discharge Date: Expected Discharge Date: 10/18/17  Team Members Present: Physician leading conference: Dr. Delice Lesch Social Worker Present: Ovidio Kin, LCSW Nurse Present: Frances Maywood, RN PT Present: Kem Parkinson, PT OT Present: Napoleon Form, OT SLP Present: Windell Moulding, SLP PPS Coordinator present : Daiva Nakayama, RN, CRRN     Current Status/Progress  Goal Weekly Team Focus  Medical   Functional deficits secondary to left BKA  Improve mobility, safety, transfers, HTN, DM, wound, constipation  See above   Bowel/Bladder        cont B & B     Swallow/Nutrition/ Hydration             ADL's             Mobility   minA overall  S overall w/c level, short distance gait and stairs for home entry  transfer/gait training, activity tolerance, amputee education   Communication             Safety/Cognition/ Behavioral Observations            Pain        no issues     Skin        monitor skin-no issues        *See Care Plan and progress notes for long and short-term goals.     Barriers to Discharge  Current Status/Progress Possible Resolutions Date Resolved   Physician    Medical stability;Weight bearing restrictions;Wound Care     See above  Therapies, optimize HTN/DM meds, d/c vac ned of the week, foolow labs, optimize bowel meds      Nursing                  PT  Wound Care;Weight bearing restrictions;Home environment access/layout  new amputation, steps to enter home  OT                  SLP                SW                Discharge Planning/Teaching Needs:    Home with son and daughter in-law who can provide 24 hr care if needed.     Team Discussion:  Goals supervision level-fear with ambulation working on and new eval. MD working on BP and DM meds. Constipated and nursing working on this. Wound vac to come off end of the week and will not go home with. ELOS-7 days  Revisions to Treatment Plan:  DC 7/18 new eval    Continued Need for Acute Rehabilitation Level of Care: The patient requires daily medical management by a physician with specialized training in physical medicine and rehabilitation for the following conditions: Daily direction of a multidisciplinary physical rehabilitation program to ensure safe treatment while eliciting the highest outcome that is of practical value to the patient.:  Yes Daily medical management of patient stability for increased activity during participation in an intensive rehabilitation regime.: Yes Daily analysis of laboratory values and/or radiology reports with any subsequent need for medication adjustment of medical intervention for : Blood pressure problems;Diabetes problems;Post surgical problems;Other  Elease Hashimoto 10/11/2017, 8:52 AM
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
## 2017-10-10 NOTE — Care Management Note (Signed)
Inpatient Rehabilitation Center Individual Statement of Services  Patient Name:  [name]  Date:  10/10/2017  Welcome to the [facility].  Our goal is to provide you with an individualized program based on your diagnosis and situation, designed to meet your specific needs.  With this comprehensive rehabilitation program, you will be expected to participate in at least 3 hours of rehabilitation therapies Monday-Friday, with modified therapy programming on the weekends.  Your rehabilitation program will include the following services:  Physical Therapy (PT), Occupational Therapy (OT), 24 hour per day rehabilitation nursing, Case Management (Social Worker), Rehabilitation Medicine, Nutrition Services and Pharmacy Services  Weekly team conferences will be held on Wednesday to discuss your progress.  Your Social Worker will talk with you frequently to get your input and to update you on team discussions.  Team conferences with you and your family in attendance may also be held.  Expected length of stay: 7-10 days Overall anticipated outcome: supervision with cueing  Depending on your progress and recovery, your program may change. Your Social Worker will coordinate services and will keep you informed of any changes. Your Social Worker's name and contact numbers are listed  below.  The following services may also be recommended but are not provided by the [facility] will be made to provide these services after discharge if needed.  Arrangements include referral to agencies that provide these services.  Your insurance has been verified to be:  Medicaid Your primary doctor is:  Vidal Schwalbe  Pertinent information will be shared with your doctor and your insurance company.  Social Worker:  Ovidio Kin, [place] or ([phone]  Information discussed with and copy given to patient by: Elease Hashimoto, 10/10/2017, 11:51 AM
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
## 2017-10-11 ENCOUNTER — Inpatient Hospital Stay (HOSPITAL_COMMUNITY): Payer: Medicaid Other | Admitting: Occupational Therapy

## 2017-10-11 ENCOUNTER — Inpatient Hospital Stay (HOSPITAL_COMMUNITY): Payer: Medicaid Other | Admitting: Physical Therapy

## 2017-10-11 LAB — GLUCOSE, CAPILLARY
GLUCOSE-CAPILLARY: 185 mg/dL — AB (ref 70–99)
GLUCOSE-CAPILLARY: 200 mg/dL — AB (ref 70–99)
Glucose-Capillary: 182 mg/dL — ABNORMAL HIGH (ref 70–99)
Glucose-Capillary: 204 mg/dL — ABNORMAL HIGH (ref 70–99)
Glucose-Capillary: 200 mg/dL — ABNORMAL HIGH (ref 70–99)

## 2017-10-11 MED ORDER — INSULIN GLARGINE 100 UNIT/ML ~~LOC~~ SOLN
25.0000 [IU] | Freq: Every day | SUBCUTANEOUS | Status: DC
Start: 2017-10-11 — End: 2017-10-14
  Administered 2017-10-12 – 2017-10-13 (×2): 25 [IU] via SUBCUTANEOUS
  Filled 2017-10-11 (×4): qty 0.25

## 2017-10-11 MED ORDER — INSULIN GLARGINE 100 UNIT/ML ~~LOC~~ SOLN
10.0000 [IU] | Freq: Once | SUBCUTANEOUS | Status: AC
  Administered 2017-10-11: 10 [IU] via SUBCUTANEOUS
  Filled 2017-10-11: qty 0.1
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
## 2017-10-11 NOTE — Progress Notes (Signed)
Occupational Therapy Session Note  Patient Details  Name: [name] MRN: [id number] Date of Birth: 27-Sep-1960  Today's Date: 10/11/2017 OT Individual Time: 1000-1100 OT Individual Time Calculation (min): 60 min    Short Term Goals: Week 1:  OT Short Term Goal 1 (Week 1): STG=LTG due to LOS  Skilled Therapeutic Interventions/Progress Updates:    Pt seen for OT session focusing on ADL re-training and functional standing balance and endurance. Pt in supine upon arrival, voicing fatigue from previous sessions, however, willing to participate as able.  HE transferred to EOB and completed squat pivot transfer to w/c with supervision and set-up of equipment. Bathing/dressing completed from w/c level at sink, pt stood at sink for clothing management and buttock hygiene. Required increased encouragement to let go of sink ledge in order to wash with R UE buttock and complete clothing management, required assist to advance pants fully over L hip as pt fearful to let go of sink with L UE. Pt able to independently manage limb guard in order to wash underneath.  He self propelled w/c to therapy gym with supervision, intermittent rest breaks required. In gym, completed dynamic standing task at RW, focusing on functional standing balance progressing to only one UE support on RW. Completed x3 trials, pt requiring initially min fading to mod A for standing balance when not supporting with L UE during task. Education provided regarding purpose of activity and functional implications. Pt left seated in w/c at end of session with hand off to PT.   Therapy Documentation Precautions:  Precautions Precautions: Fall Precaution Comments: wound VAC Required Braces or Orthoses: Other Brace/Splint Other Brace/Splint: L leg limb guard Restrictions Weight Bearing Restrictions: Yes LLE Weight Bearing: Non weight bearing Pain: Pain Assessment Pain Scale: 0-10 Pain Score: 7  Pain Type: Acute pain;Surgical  pain Pain Location: Knee Pain Orientation: Left Pain Intervention(s): Repositioned, increased activity  See Function Navigator for Current Functional Status.   Therapy/Group: Individual Therapy  Tyara Dassow L 10/11/2017, 6:53 AM
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
## 2017-10-11 NOTE — Progress Notes (Signed)
Social Work Patient ID: [name], male   DOB: [birth year], 57 y.o.   MRN: [id number]  Met with pt to inform of team conference goals supervision level and target discharge 7/18. He is pleased with how well his fist day went yesterday. Will work on discharge needs for next week.

## 2017-10-11 NOTE — Progress Notes (Signed)
Physical Therapy Session Note  Patient Details  Name: [name] MRN: [id number] Date of Birth: [birth year]  Today's Date: 10/11/2017 PT Individual Time: 1100-1125 PT Individual Time Calculation (min): 25 min   Short Term Goals: Week 1:  PT Short Term Goal 1 (Week 1): =LTG due to estimated LOS  Skilled Therapeutic Interventions/Progress Updates:    pt performs seated UE therex with 4# dowel 2 x 10 chest press, shoulder press, shoulder horiz abd/add, circles CW/CCW, bicep curl.  Pt performs sit <> stand and squat pivot transfers throughout session with min guard.  Gait with standard walker 2 x 20' with min guard.  Pt limited by fatigue and residual limb pain. Pt supervision for supine <> sit.  Pt left in bed with alarm set, needs at hand.  Therapy Documentation Precautions:  Precautions Precautions: Fall Precaution Comments: wound VAC Required Braces or Orthoses: Other Brace/Splint Other Brace/Splint: L leg limb guard Restrictions Weight Bearing Restrictions: Yes LLE Weight Bearing: Non weight bearing    Pain: Pt c/o pain in residual limb with activity, rests as needed during session.  Therapy/Group: Individual Therapy  Avanna Sowder 10/11/2017, 11:26 AM

## 2017-10-11 NOTE — Progress Notes (Signed)
Physical Therapy Session Note  Patient Details  Name: [name] MRN: [id number] Date of Birth: Mar 12, 1961  Today's Date: 10/11/2017 PT Individual Time: 0800-0915 and 1300-1400 PT Individual Time Calculation (min): 75 min and 60 min  Short Term Goals: Week 1:  PT Short Term Goal 1 (Week 1): =LTG due to estimated LOS  Skilled Therapeutic Interventions/Progress Updates:    Session1: Pt received seated in bed, agreeable to PT. Pt reports 8/10 L residual limb pain, RN provides pain medication at beginning of therapy session and pt reports some relief of pain throughout therapy session. Bed mobility Supervision. Squat pivot transfer bed to/from w/c and therapy mat with CGA throughout therapy session. Session focus on safe setup of w/c and management of w/c parts prior to transfers from varying height surfaces. Sit to stand x 5 reps from elevated mat table to RW with min A. Ambulation x 5 ft with RW and min A before onset of fatigue and increase in L residual limb pain. Ascend/descend one 1" step in // bars with mod A, fwds and backwards. Pt reports it is easier to hop up step going fwds, will continue to progress to higher step as pt is able. Toilet transfer with mod A and use of grab bar. Per pt report he has not being using elevated commode seat over toilet but based on assist level needed with transfer he would benefit from use of one. Pt left seated on toilet with call button in reach.  Session 2: Pt received seated in bed, agreeable to PT. Pt reports 7/10 pain in L residual limb, RN provides pain medication at beginning of therapy session. Squat pivot transfer bed to/from w/c and w/c to/from mat table with CGA, v/c for safe setup and management of w/c parts. Sit to from supine with Supervision. Supine BLE therex x 10-15 reps: hip abd, knee flex, SLR, quad sets. Seated BLE therex x 15 reps: marches, LAQ. Education with patient about residual limb positioning to avoid knee flexion contracture  as well as purpose of limb guard. Pt left semi-reclined in bed with needs in reach, bed alarm in place.  Therapy Documentation Precautions:  Precautions Precautions: Fall Precaution Comments: wound VAC Required Braces or Orthoses: Other Brace/Splint Other Brace/Splint: L leg limb guard Restrictions Weight Bearing Restrictions: Yes LLE Weight Bearing: Non weight bearing  See Function Navigator for Current Functional Status.   Therapy/Group: Individual Therapy  Excell Seltzer, PT, DPT  10/11/2017, 12:03 PM

## 2017-10-12 ENCOUNTER — Inpatient Hospital Stay (HOSPITAL_COMMUNITY): Payer: Medicaid Other

## 2017-10-12 ENCOUNTER — Inpatient Hospital Stay (HOSPITAL_COMMUNITY): Payer: Medicaid Other | Admitting: Physical Therapy

## 2017-10-12 LAB — GLUCOSE, CAPILLARY
GLUCOSE-CAPILLARY: 200 mg/dL — AB (ref 70–99)
GLUCOSE-CAPILLARY: 195 mg/dL — AB (ref 70–99)
GLUCOSE-CAPILLARY: 156 mg/dL — AB (ref 70–99)
Glucose-Capillary: 177 mg/dL — ABNORMAL HIGH (ref 70–99)
Glucose-Capillary: 171 mg/dL — ABNORMAL HIGH (ref 70–99)

## 2017-10-12 NOTE — Progress Notes (Addendum)
[place] PHYSICAL MEDICINE & REHABILITATION     PROGRESS NOTE  Subjective/Complaints:  Patient seen sitting up in his chair this morning working with therapies. He states he did not sleep well last night, but cannot identify why. He is pleased that his VAC is being DC today  ROS: Denies CP, SOB, N/V/D.  Objective: Vital Signs: Blood pressure 121/67, pulse 65, temperature 97.8 F (36.6 C), temperature source Oral, resp. rate 12, height 5\' 7"  (1.702 m), weight 85.3 kg (188 lb 0.8 oz), SpO2 99 %. No results found. Recent Labs    10/10/17 0634  WBC 7.7  HGB 9.6*  HCT 30.8*  PLT 580*   Recent Labs    10/10/17 0634  NA 135  K 4.9  CL 102  GLUCOSE 223*  BUN 20  CREATININE 1.00  CALCIUM 9.3   CBG (last 3)  Recent Labs    10/11/17 1656 10/11/17 2106 10/12/17 0701  GLUCAP 200* 200* 177*    Wt Readings from Last 3 Encounters:  10/09/17 85.3 kg (188 lb 0.8 oz)  10/02/17 102.5 kg (226 lb)  05/28/17 100.2 kg (221 lb)    Physical Exam:  BP 121/67 (BP Location: Right Arm)   Pulse 65   Temp 97.8 F (36.6 C) (Oral)   Resp 12   Ht 5\' 7"  (1.702 m)   Wt 85.3 kg (188 lb 0.8 oz)   SpO2 99%   BMI 29.45 kg/m  Constitutional: He appearswell-developedand well-nourished.  HENT: Normocephalicand atraumatic.  Eyes:EOMare normal. No discharge.  Cardiovascular: RRR. No JVD. Respiratory: Norespiratory distress. Clear. GI:He exhibitsno distension. There isno tenderness.  Musculoskeletal: L-BKA with compression sock on top of VAC.  Neurological: He isalertand oriented.  Motor: B/l UE 5/5.  RLE 4-4/5 proximal to distal.  LLE 4-/5 HF (some pain inhibition) Skin: Left BKA dressed with vac.  Well healed old scars on right knee and left forearm from prior injuries. Psychiatric: normal mood. Normal affect.  Assessment/Plan: 1. Functional deficits secondary to left BKA which require 3+ hours per day of interdisciplinary therapy in a comprehensive inpatient rehab  setting. Physiatrist is providing close team supervision and 24 hour management of active medical problems listed below. Physiatrist and rehab team continue to assess barriers to discharge/monitor patient progress toward functional and medical goals.  Function:  Bathing Bathing position   Position: Wheelchair/chair at sink  Bathing parts Body parts bathed by patient: Right arm, Left arm, Chest, Abdomen, Front perineal area, Right upper leg Body parts bathed by helper: Buttocks, Right lower leg, Back  Bathing assist Assist Level: Touching or steadying assistance(Pt > 75%)      Upper Body Dressing/Undressing Upper body dressing   What is the patient wearing?: Pull over shirt/dress     Pull over shirt/dress - Perfomed by patient: Thread/unthread right sleeve, Thread/unthread left sleeve, Put head through opening, Pull shirt over trunk Pull over shirt/dress - Perfomed by helper: Pull shirt over trunk        Upper body assist Assist Level: Supervision or verbal cues      Lower Body Dressing/Undressing Lower body dressing   What is the patient wearing?: Pants, Non-skid slipper socks     Pants- Performed by patient: Thread/unthread right pants leg, Thread/unthread left pants leg Pants- Performed by helper: Pull pants up/down Non-skid slipper socks- Performed by patient: Don/doff left sock(R n/a) Non-skid slipper socks- Performed by helper: Don/doff left sock                  Lower  body assist Assist for lower body dressing: Touching or steadying assistance (Pt > 75%)      Toileting Toileting     Toileting steps completed by helper: Adjust clothing prior to toileting, Performs perineal hygiene, Adjust clothing after toileting Toileting Assistive Devices: Grab bar or rail  Toileting assist     Transfers Chair/bed transfer   Chair/bed transfer method: Squat pivot Chair/bed transfer assist level: Touching or steadying assistance (Pt > 75%) Chair/bed transfer assistive  device: Armrests     Locomotion Ambulation     Max distance: 5' Assist level: Touching or steadying assistance (Pt > 75%)   Wheelchair   Type: Manual Max wheelchair distance: 150' Assist Level: Supervision or verbal cues  Cognition Comprehension Comprehension assist level: Follows basic conversation/direction with no assist  Expression Expression assist level: Expresses complex 90% of the time/cues < 10% of the time  Social Interaction Social Interaction assist level: Interacts appropriately with others with medication or extra time (anti-anxiety, antidepressant).  Problem Solving Problem solving assist level: Solves basic 90% of the time/requires cueing < 10% of the time  Memory Memory assist level: Recognizes or recalls 75 - 89% of the time/requires cueing 10 - 24% of the time    Medical Problem List and Plan: 1.Functional deficitssecondary to left BKA  Continue CIR 2. DVT Prophylaxis/Anticoagulation: Start--Pharmaceutical:Lovenox 3. Pain Management:Continue oxycodone prn with Neurontin tid for neuropathy/phantom pain. 4. Mood:LCSW to follow for evaluation and support. 5. Neuropsych: This patientiscapable of making decisions on hisown behalf. 6. Skin/Wound Care:Routine pressure relief measures. Monitor wound for changes once DC VAC today. Continue protein supplement to help promote healing. 7. Fluids/Electrolytes/Nutrition: Monitor I/Os.  BMP within acceptable range, except for glucose on 7/10  Labs ordered for Monday 8. T2DM: Hgb A1c- 11.4-- poorly controlled. Was on Lantus 75 units at home with metformin 2000 mg daily PTA.   Lantus 15 units, increased to 25 on 7/11  Resumed metformin at 1000 mg bid   Will monitor BS ac/hs and titrate with increased physical activity  Remains elevated, plan to increase again in the next couple days 9. ABLA on Anemia of chronic illness?:   Hb 9.6 on 7/10  Labs ordered for Monday  Cont to monitor 10. HTN: Monitor BP bid.  Continue Norvasc.   Controlled on 7/12 11. Thrombocytosis: Likely reactive and Lovenox added. Continue to monitor for recovery.   Plts 580 on 7/10  Labs ordered for Monday  Cont to monitor 12. Constipation: Increased bowel reg on 7/10  LOS (Days) 3 A FACE TO FACE EVALUATION WAS PERFORMED  Delmi Fulfer Lorie Phenix 10/12/2017 7:53 AM

## 2017-10-12 NOTE — Progress Notes (Signed)
Occupational Therapy Session Note  Patient Details  Name: [name] MRN: [id number] Date of Birth: 29-Dec-1960  Today's Date: 10/12/2017 OT Individual Time: 4627-0350 OT Individual Time Calculation (min): 72 min    Short Term Goals: Week 2:    Week 3:     Skilled Therapeutic Interventions/Progress Updates:    1;1. Pt with pain in knee. RN aware and pt wiling to participate in tx with therapy to tolerance. Pt squat pivot transfer EOB>w/c with VC for hand placement. Pt completes UB bathing and dressing at sink with set up and A to wash back. Pt completes standing at sink with touching A for static standing balance to wash buttocks and advance pants past hips. Pt hesitant with washing buttocks with LUE, however with encouragemetn pt able to attempt and complete with min-MOD A for standing balance demoing R lean. Pt able to correct with VC. Pt propels w/c to tx gym for BUE strengthening and endurance. Pt completes 2x30 passes of basketball (chest, bounce and overhead) for BUE strengthening and coordination required for functional transfers. Pt completes static standing with 1UE support on walker while completing game of checkers with touching A in prep for clothing management. Pt compeltes stand pivot transfer with MIN A and touching A for clothing management prior to toileting. Pt left seated on commode and verbalizes he will pull cord prior for supervision for transfer. NT aware of positioning  Therapy Documentation Precautions:  Precautions Precautions: Fall Precaution Comments: wound VAC Required Braces or Orthoses: Other Brace/Splint Other Brace/Splint: L leg limb guard Restrictions Weight Bearing Restrictions: Yes LLE Weight Bearing: Non weight bearing General:   See Function Navigator for Current Functional Status.   Therapy/Group: Individual Therapy  Tonny Branch 10/12/2017, 7:22 AM

## 2017-10-12 NOTE — Progress Notes (Signed)
Physical Therapy Session Note  Patient Details  Name: [name] MRN: [id number] Date of Birth: 1960/05/14  Today's Date: 10/12/2017 PT Individual Time: 1000-1100 and 1300-1400 PT Individual Time Calculation (min): 60 min and 60 min (total 120 min)   Short Term Goals: Week 1:  PT Short Term Goal 1 (Week 1): =LTG due to estimated LOS  Skilled Therapeutic Interventions/Progress Updates: Tx 1: Pt received asleep in bed, easily awoken but lethargic and requires increased time to initiate mobility activities. Supine>sit with S. Squat pivot to L with close S. W/c propulsion to gym with S; provided pt with w/c gloves to improve grip and efficiency of propulsion. Gait x15' with RW and min guard. Ascent/descent one step 4" height with BUE support on RW with min guard. Sit <>supine<>prone wth S; educated re: importance of prone lying for hip flexor stretch. Performed 2x15 prone LE extension. Returned to w/c min guard squat pivot. W/c propulsion to room. Pt declined remaining OOB at this time; squat pivot to bed with S. Remained supine in bed, all needs in reach.   Tx 2: Pt received asleep in bed, easily awoken and agreeable to treatment. Supine>sit with S. Squat pivot transfer bed<>w/c with S. W/c propulsion indoors/outdoors with S, slow speed >150' with occasional rest breaks as needed d/t fatigue. Performed standing toileting in handicap accessible bathroom; min guard for standing balance while urinating. Min cues to problem solve accessing sink, hand towel, trashcan. Squat pivot w/c<>outdoor bench for practice with community access. LLE hamstring stretch x5 min while therapist and pt discussed peer support visitor again; pt appeared more interested however continues to decline peer support at this time. Pt verbalizes "I just want me leg back"; provided emotional support, discussed phases of grieving for loss of his limb. Will refer to neuropsych for consultation. Squat pivot w/c <>nustep with S.  Performed BUE/RLE nustep level 6 with average 40 steps/min x8 min; educated pt regarding importance of cardiovascular exercise for heart health and circulatory benefits. Returned to bed squat pivot with S. Remained in bed, all needs in reach at completion of session.      Therapy Documentation Precautions:  Precautions Precautions: Fall Precaution Comments: wound VAC Required Braces or Orthoses: Other Brace/Splint Other Brace/Splint: L leg limb guard Restrictions Weight Bearing Restrictions: Yes LLE Weight Bearing: Non weight bearing Pain: Pain Assessment Pain Scale: 0-10 Pain Score: 4  Pain Type: Acute pain Pain Location: Knee Pain Orientation: Left Pain Descriptors / Indicators: Aching Pain Frequency: Intermittent Pain Onset: On-going Patients Stated Pain Goal: 2 Pain Intervention(s): Medication (See eMAR)   See Function Navigator for Current Functional Status.   Therapy/Group: Individual Therapy  [name] 10/12/2017, 12:01 PM

## 2017-10-13 ENCOUNTER — Inpatient Hospital Stay (HOSPITAL_COMMUNITY): Payer: Medicaid Other | Admitting: Physical Therapy

## 2017-10-13 ENCOUNTER — Inpatient Hospital Stay (HOSPITAL_COMMUNITY): Payer: Medicaid Other | Admitting: Occupational Therapy

## 2017-10-13 LAB — GLUCOSE, CAPILLARY
GLUCOSE-CAPILLARY: 181 mg/dL — AB (ref 70–99)
GLUCOSE-CAPILLARY: 153 mg/dL — AB (ref 70–99)
Glucose-Capillary: 186 mg/dL — ABNORMAL HIGH (ref 70–99)
Glucose-Capillary: 160 mg/dL — ABNORMAL HIGH (ref 70–99)

## 2017-10-13 NOTE — Progress Notes (Signed)
Physical Therapy Session Note  Patient Details  Name: [name] MRN: [id number] Date of Birth: 12/09/1960  Today's Date: 10/13/2017 PT Individual Time: 0900-1000; 1500-1530 PT Individual Time Calculation (min): 60 min and 30 min  Short Term Goals: Week 1:  PT Short Term Goal 1 (Week 1): =LTG due to estimated LOS  Skilled Therapeutic Interventions/Progress Updates:    Session 1: Pt received supine in bed, agreeable to PT. Pt reports 10/10 pain in L residual limb, RN provides pain medication at beginning of therapy session. Bed mobility Supervision. Squat pivot transfer bed to w/c with SBA. Manual w/c propulsion 2 x 150 ft with BUE with Supervision, increased time needed to complete. Sit to stand with min A to RW. Ascend/descend one 3" step with RW and min A. Attempt to ascend 3" stair with B handrails going forwards facing stairs, pt reports he doesn't feel able to clear his RLE. Ascend/descend 3 x 3" steps with B handrails and mod A going backwards. Ambulation x 10 ft with RW and min A for balance. Toilet transfer with min A and use of grab bar and elevated commode seat over toilet. Pt left seated on toilet with call button in reach.  Session 2: Pt received supine in bed, agreeable to PT. Reports pain is improved from AM session, not rated. Pt's son present during therapy session. Pt and son have questions with regards to how patient is performing stairs so far. Demonstration of how pt completed stairs this AM. Pt's son reports that the handrails on the stairs at their home are too far apart to reach both at the same time. Discussed scooting up stairs vs using a ramp to enter home. Will continue to follow up with case management and primary PT for best method for pt to safely enter his home. Manual w/c propulsion 2 x 150 ft with BUE with Supervision. Seated LLE therex x 10 reps. Education with patient about touching his residual limb for desensitization as well as limb care with shrinker.  Pt appears more receptive to education and in better spirits this PM with his family present. Pt returned to bed with SBA for SPT, min cues for safe w/c setup prior to transfer. Pt left supine in bed with needs in reach.  Therapy Documentation Precautions:  Precautions Precautions: Fall Precaution Comments: wound VAC Required Braces or Orthoses: Other Brace/Splint Other Brace/Splint: L leg limb guard Restrictions Weight Bearing Restrictions: Yes LLE Weight Bearing: Non weight bearing  See Function Navigator for Current Functional Status.   Therapy/Group: Individual Therapy  Excell Seltzer, PT, DPT  10/13/2017, 10:00 AM

## 2017-10-13 NOTE — Progress Notes (Signed)
[place] PHYSICAL MEDICINE & REHABILITATION     PROGRESS NOTE  Subjective/Complaints:  No new issues. RN apparently just took off vac dressing. Having pain still in stump but tolerable  ROS: Patient denies fever, rash, sore throat, blurred vision, nausea, vomiting, diarrhea, cough, shortness of breath or chest pain, joint or back pain, headache, or mood change.    Objective: Vital Signs: Blood pressure 121/73, pulse 68, temperature 99 F (37.2 C), temperature source Oral, resp. rate 18, height 5\' 7"  (1.702 m), weight 85.3 kg (188 lb 0.8 oz), SpO2 97 %. No results found. No results for input(s): WBC, HGB, HCT, PLT in the last 72 hours. No results for input(s): NA, K, CL, GLUCOSE, BUN, CREATININE, CALCIUM in the last 72 hours.  Invalid input(s): CO CBG (last 3)  Recent Labs    10/12/17 1640 10/12/17 2141 10/13/17 0640  GLUCAP 195* 156* 181*    Wt Readings from Last 3 Encounters:  10/09/17 85.3 kg (188 lb 0.8 oz)  10/02/17 102.5 kg (226 lb)  05/28/17 100.2 kg (221 lb)    Physical Exam:  BP 121/73 (BP Location: Right Arm)   Pulse 68   Temp 99 F (37.2 C) (Oral)   Resp 18   Ht 5\' 7"  (1.702 m)   Wt 85.3 kg (188 lb 0.8 oz)   SpO2 97%   BMI 29.45 kg/m  Constitutional: No distress . Vital signs reviewed. HEENT: EOMI, oral membranes moist Neck: supple Cardiovascular: RRR without murmur. No JVD    Respiratory: CTA Bilaterally without wheezes or rales. Normal effort    GI: BS +, non-tender, non-distended  Musculoskeletal: L-BKA tender, swollen Neurological: He isalertand oriented.  Motor: B/l UE 5/5.  RLE 4-4/5 proximal to distal.  LLE 4-/5 HF (pain inhibition) Skin: Left BKA incision cdi with staples, mkinimal s/s drainage  Well healed old scars on right knee and left forearm from prior injuries. Psychiatric:  cooperative  Assessment/Plan: 1. Functional deficits secondary to left BKA which require 3+ hours per day of interdisciplinary therapy in a  comprehensive inpatient rehab setting. Physiatrist is providing close team supervision and 24 hour management of active medical problems listed below. Physiatrist and rehab team continue to assess barriers to discharge/monitor patient progress toward functional and medical goals.  Function:  Bathing Bathing position   Position: Wheelchair/chair at sink  Bathing parts Body parts bathed by patient: Right arm, Left arm, Chest, Abdomen, Front perineal area, Right upper leg Body parts bathed by helper: Buttocks, Right lower leg, Back  Bathing assist Assist Level: Touching or steadying assistance(Pt > 75%)      Upper Body Dressing/Undressing Upper body dressing   What is the patient wearing?: Pull over shirt/dress     Pull over shirt/dress - Perfomed by patient: Thread/unthread right sleeve, Thread/unthread left sleeve, Put head through opening, Pull shirt over trunk Pull over shirt/dress - Perfomed by helper: Pull shirt over trunk        Upper body assist Assist Level: Supervision or verbal cues      Lower Body Dressing/Undressing Lower body dressing   What is the patient wearing?: Pants, Non-skid slipper socks     Pants- Performed by patient: Thread/unthread right pants leg, Thread/unthread left pants leg Pants- Performed by helper: Pull pants up/down Non-skid slipper socks- Performed by patient: Don/doff left sock(R n/a) Non-skid slipper socks- Performed by helper: Don/doff left sock                  Lower body assist Assist for lower  body dressing: Touching or steadying assistance (Pt > 75%)      Toileting Toileting   Toileting steps completed by patient: Adjust clothing prior to toileting, Performs perineal hygiene, Adjust clothing after toileting(per Jobelle Mesias, NT at bed level) Toileting steps completed by helper: Adjust clothing prior to toileting, Performs perineal hygiene, Adjust clothing after toileting Toileting Assistive Devices: Grab bar or rail   Toileting assist     Transfers Chair/bed transfer   Chair/bed transfer method: Squat pivot Chair/bed transfer assist level: Supervision or verbal cues Chair/bed transfer assistive device: Armrests     Locomotion Ambulation     Max distance: 15 Assist level: Touching or steadying assistance (Pt > 75%)   Wheelchair   Type: Manual Max wheelchair distance: 150' Assist Level: Supervision or verbal cues  Cognition Comprehension Comprehension assist level: Follows basic conversation/direction with no assist  Expression Expression assist level: Expresses complex 90% of the time/cues < 10% of the time  Social Interaction Social Interaction assist level: Interacts appropriately with others with medication or extra time (anti-anxiety, antidepressant).  Problem Solving Problem solving assist level: Solves basic 90% of the time/requires cueing < 10% of the time  Memory Memory assist level: Recognizes or recalls 75 - 89% of the time/requires cueing 10 - 24% of the time    Medical Problem List and Plan: 1.Functional deficitssecondary to left BKA  Continue CIR 2. DVT Prophylaxis/Anticoagulation: Start--Pharmaceutical:Lovenox 3. Pain Management:Continue oxycodone prn with Neurontin tid for neuropathy/phantom pain. 4. Mood:LCSW to follow for evaluation and support. 5. Neuropsych: This patientiscapable of making decisions on hisown behalf. 6. Skin/Wound Care:dry dressing/shrinker sock daily 7. Fluids/Electrolytes/Nutrition: Monitor I/Os.  BMP within acceptable range, except for glucose on 7/10 8. T2DM: Hgb A1c- 11.4-- poorly controlled. Was on Lantus 75 units at home with metformin 2000 mg daily PTA.   Lantus 15 units, increased to 25 on 7/11  Resumed metformin at 1000 mg bid   Improving control 7/13 9. ABLA on Anemia of chronic illness?:   Hb 9.6 on 7/10  Cont to monitor 10. HTN: Monitor BP bid. Continue Norvasc.   Controlled on 7/11 11. Thrombocytosis: Likely reactive and  Lovenox added. Continue to monitor for recovery.   Plts 580 on 7/10  Cont to monitor 12. Constipation: Increased bowel reg on 7/10. Needs bm today  LOS (Days) 4 A FACE TO FACE EVALUATION WAS PERFORMED  Meredith Staggers 10/13/2017 7:41 AM

## 2017-10-13 NOTE — Progress Notes (Addendum)
Occupational Therapy Session Note  Patient Details  Name: Shellie Goettl MRN: [id number] Date of Birth: 01/31/1961  Today's Date: 10/13/2017 OT Individual Time: 1100-1130 2nd session 1- 1:26 complained of only 2 hours sleep last night (missed 49 minutes of his session) OT Individual Time Calculation (min): 30 min   Skilled Therapeutic Interventions/Progress Updates: patient complained of great pain in residual limb but concurred to sitting up in bed wash upper body and and doff his shirt which had spilled milk on it.   He had no other clothes and was agreeable to don a scrub after washing upper body.  He completed the upperbody bathind and dressing.  He was able to return demonstration on utilizing bed controls, with extra time.  This will help him become more independent with bed positioning.   As well, he completed oral care with set up.  RN brought in pain meds and explained pain med schedule.   This patient was left with call bell andphone within reach at the end of the session.   He asked to remain in bed and rest as he'd participated in other out of bed therapies earlier and stated he'd only had 2 hours sleep last night    2nd session.   He transferred bed to/fr w/c with min A squat pivot to his left and to his right.  He completed sit to stand with CGA x 4 to wash and pull up pants and practice.   He required max A to periwash this session as he stated he was too fatigued to balance to complete all the washing tasks.  He pulled up his pants with moderate assistANCE.   He requested to ly back down and rest for 1 hour before his next session.   Call bell and phone were placed within his reach.  Therapy Documentation Precautions:  Precautions Precautions: Fall Precaution Comments: wound VAC Required Braces or Orthoses: Other Brace/Splint Other Brace/Splint: L leg limb guard Restrictions Weight Bearing Restrictions: Yes LLE Weight Bearing: Non weight bearing   Pain:8/10 left  residual limb.   RN gave pain meds    Therapy/Group: Individual Therapy  Alfredia Ferguson [facility] 10/13/2017, 3:34 PM

## 2017-10-14 ENCOUNTER — Inpatient Hospital Stay (HOSPITAL_COMMUNITY): Payer: Medicaid Other

## 2017-10-14 LAB — GLUCOSE, CAPILLARY
GLUCOSE-CAPILLARY: 122 mg/dL — AB (ref 70–99)
GLUCOSE-CAPILLARY: 139 mg/dL — AB (ref 70–99)
GLUCOSE-CAPILLARY: 142 mg/dL — AB (ref 70–99)
Glucose-Capillary: 148 mg/dL — ABNORMAL HIGH (ref 70–99)

## 2017-10-14 MED ORDER — INSULIN GLARGINE 100 UNIT/ML ~~LOC~~ SOLN
28.0000 [IU] | Freq: Every day | SUBCUTANEOUS | Status: DC
  Administered 2017-10-14 – 2017-10-18 (×5): 28 [IU] via SUBCUTANEOUS
  Filled 2017-10-14 (×6): qty 0.28

## 2017-10-14 NOTE — Progress Notes (Signed)
Physical Therapy Session Note  Patient Details  Name: Cardarius Senat MRN: [id number] Date of Birth: [birth year]  Today's Date: 10/14/2017 PT Individual Time: 0800-0855 PT Individual Time Calculation (min): 55 min   Short Term Goals: Week 1:  PT Short Term Goal 1 (Week 1): =LTG due to estimated LOS  Skilled Therapeutic Interventions/Progress Updates:    Pt supine in bed upon PT arrival, agreeable to therapy tx and reports pain 9/10 in L LE. Pt also reports he did not sleep well last night. Pt transferred supine>sitting EOB with supervision and performed squat pivot transfer to w/c with supervision. Increased time for w/c parts management with verbal cues. Pt propelled w/c from room<>gym this session 2 x 200 ft with B UEs and supervision, increased time with verbal cues for increased UE stride. Pt performed squat pivot to mat with supervision, focusing on independence with set up and w/c parts management. Pt transferred to supine and performed therex for strengthening: 2 x 10 SLR, 2 x 10 hip flexion, 2 x 10 sidelying hip abduction, 2 x 10 prone hip extension, and x 10 R LE bridges. Pt transferred to sitting with supervision and performed sit<>stand with RW and supervision, pt performed hip abduction/flexion/extension 2 x 10 of each with L LE in standing with RW for strengthening. Pt transferred back to w/c squat pivot, propelled w/c to room. Pt performed squat pivot back to bed and left with needs in reach, bed alarm set.   Therapy Documentation Precautions:  Precautions Precautions: Fall Precaution Comments: wound VAC Required Braces or Orthoses: Other Brace/Splint Other Brace/Splint: L leg limb guard Restrictions Weight Bearing Restrictions: Yes LLE Weight Bearing: Non weight bearing   See Function Navigator for Current Functional Status.   Therapy/Group: Individual Therapy  Netta Corrigan, PT, DPT 10/14/2017, 7:40 AM

## 2017-10-14 NOTE — Progress Notes (Signed)
[facility] MEDICINE & REHABILITATION     PROGRESS NOTE  Subjective/Complaints:  Had a good night.  Pain seems under control.  ROS: Patient denies fever, rash, sore throat, blurred vision, nausea, vomiting, diarrhea, cough, shortness of breath or chest pain, joint or back pain, headache, or mood change.    Objective: Vital Signs: Blood pressure 109/64, pulse 63, temperature 98.2 F (36.8 C), temperature source Oral, resp. rate 17, height 5\' 7"  (1.702 m), weight 85.3 kg (188 lb 0.8 oz), SpO2 97 %. No results found. No results for input(s): WBC, HGB, HCT, PLT in the last 72 hours. No results for input(s): NA, K, CL, GLUCOSE, BUN, CREATININE, CALCIUM in the last 72 hours.  Invalid input(s): CO CBG (last 3)  Recent Labs    10/13/17 1643 10/13/17 2134 10/14/17 0631  GLUCAP 160* 153* 122*    Wt Readings from Last 3 Encounters:  10/09/17 85.3 kg (188 lb 0.8 oz)  10/02/17 102.5 kg (226 lb)  05/28/17 100.2 kg (221 lb)    Physical Exam:  BP 109/64 (BP Location: Right Arm)   Pulse 63   Temp 98.2 F (36.8 C) (Oral)   Resp 17   Ht 5\' 7"  (1.702 m)   Wt 85.3 kg (188 lb 0.8 oz)   SpO2 97%   BMI 29.45 kg/m  Constitutional: No distress . Vital signs reviewed. HEENT: EOMI, oral membranes moist Neck: supple Cardiovascular: RRR without murmur. No JVD    Respiratory: CTA Bilaterally without wheezes or rales. Normal effort    GI: BS +, non-tender, non-distended  Musculoskeletal: L-BKA tender, swollen Neurological: He isalertand oriented.  Motor: B/l UE 5/5.  RLE 4-4/5 proximal to distal.  LLE 4-/5 HF (pain inhibition) Skin: Left BKA incision cdi with staples, with minimal s/s drainage Well healed old scars on right knee and left forearm from prior injuries. Psychiatric:  cooperative  Assessment/Plan: 1. Functional deficits secondary to left BKA which require 3+ hours per day of interdisciplinary therapy in a comprehensive inpatient rehab setting. Physiatrist is  providing close team supervision and 24 hour management of active medical problems listed below. Physiatrist and rehab team continue to assess barriers to discharge/monitor patient progress toward functional and medical goals.  Function:  Bathing Bathing position   Position: Wheelchair/chair at sink  Bathing parts Body parts bathed by patient: Right arm, Left arm, Chest, Abdomen, Front perineal area, Right upper leg Body parts bathed by helper: Buttocks, Right lower leg, Back  Bathing assist Assist Level: Touching or steadying assistance(Pt > 75%)      Upper Body Dressing/Undressing Upper body dressing   What is the patient wearing?: Pull over shirt/dress     Pull over shirt/dress - Perfomed by patient: Thread/unthread right sleeve, Thread/unthread left sleeve, Put head through opening, Pull shirt over trunk Pull over shirt/dress - Perfomed by helper: Pull shirt over trunk        Upper body assist Assist Level: Supervision or verbal cues      Lower Body Dressing/Undressing Lower body dressing   What is the patient wearing?: Pants, Non-skid slipper socks     Pants- Performed by patient: Thread/unthread right pants leg, Thread/unthread left pants leg Pants- Performed by helper: Pull pants up/down Non-skid slipper socks- Performed by patient: Don/doff left sock(R n/a) Non-skid slipper socks- Performed by helper: Don/doff left sock                  Lower body assist Assist for lower body dressing: Touching or steadying assistance (Pt >  75%)      Toileting Toileting   Toileting steps completed by patient: Adjust clothing prior to toileting, Performs perineal hygiene, Adjust clothing after toileting Toileting steps completed by helper: Adjust clothing prior to toileting, Performs perineal hygiene, Adjust clothing after toileting Toileting Assistive Devices: Grab bar or rail  Toileting assist Assist level: Touching or steadying assistance (Pt.75%)   Transfers Chair/bed  transfer   Chair/bed transfer method: Squat pivot Chair/bed transfer assist level: Supervision or verbal cues Chair/bed transfer assistive device: Armrests     Locomotion Ambulation     Max distance: 10' Assist level: Touching or steadying assistance (Pt > 75%)   Wheelchair   Type: Manual Max wheelchair distance: 150' Assist Level: Supervision or verbal cues  Cognition Comprehension Comprehension assist level: Follows basic conversation/direction with no assist  Expression Expression assist level: Expresses complex 90% of the time/cues < 10% of the time  Social Interaction Social Interaction assist level: Interacts appropriately with others with medication or extra time (anti-anxiety, antidepressant).  Problem Solving Problem solving assist level: Solves basic 90% of the time/requires cueing < 10% of the time  Memory Memory assist level: Recognizes or recalls 75 - 89% of the time/requires cueing 10 - 24% of the time    Medical Problem List and Plan: 1.Functional deficitssecondary to left BKA  Continue CIR therapies 2. DVT Prophylaxis/Anticoagulation: Start--Pharmaceutical:Lovenox 3. Pain Management:Continue oxycodone prn with Neurontin tid for neuropathy/phantom pain. 4. Mood:LCSW to follow for evaluation and support. 5. Neuropsych: This patientiscapable of making decisions on hisown behalf. 6. Skin/Wound Care:dry dressing/shrinker sock daily 7. Fluids/Electrolytes/Nutrition: Monitor I/Os.  BMP within acceptable range, except for glucose on 7/10 8. T2DM: Hgb A1c- 11.4-- poorly controlled. Was on Lantus 75 units at home with metformin 2000 mg daily PTA.   -improving control but still suboptimal  Lantus 15 units, increased to 25 on 7/11---increase to 28u beginning today  Resumed metformin at 1000 mg bid  9. ABLA on Anemia of chronic illness?:   Hb 9.6 on 7/10  Cont to monitor 10. HTN: Monitor BP bid. Continue Norvasc.   Controlled on 7/11 11. Thrombocytosis:  Likely reactive and Lovenox added. Continue to monitor for recovery.   Plts 580 on 7/10  Cont to monitor 12. Constipation: Increased bowel reg on 7/10. Had bm 7/13  LOS (Days) 5 A FACE TO FACE EVALUATION WAS PERFORMED  Meredith Staggers 10/14/2017 7:38 AM

## 2017-10-15 ENCOUNTER — Inpatient Hospital Stay (HOSPITAL_COMMUNITY): Payer: Medicaid Other | Admitting: Occupational Therapy

## 2017-10-15 ENCOUNTER — Inpatient Hospital Stay (HOSPITAL_COMMUNITY): Payer: Medicaid Other | Admitting: Physical Therapy

## 2017-10-15 DIAGNOSIS — S88111S Complete traumatic amputation at level between knee and ankle, right lower leg, sequela: Secondary | ICD-10-CM

## 2017-10-15 LAB — BASIC METABOLIC PANEL
ANION GAP: 10 (ref 5–15)
BUN: 16 mg/dL (ref 6–20)
CHLORIDE: 102 mmol/L (ref 98–111)
CO2: 24 mmol/L (ref 22–32)
Calcium: 9.2 mg/dL (ref 8.9–10.3)
Creatinine, Ser: 0.85 mg/dL (ref 0.61–1.24)
GFR calc Af Amer: >60 mL/min (ref >60)
GFR calc non Af Amer: >60 mL/min (ref >60)
GLUCOSE: 170 mg/dL — AB (ref 70–99)
POTASSIUM: 4.3 mmol/L (ref 3.5–5.1)
Sodium: 136 mmol/L (ref 135–145)

## 2017-10-15 LAB — CBC WITH DIFFERENTIAL/PLATELET
ABS IMMATURE GRANULOCYTES: 0 10*3/uL (ref 0.0–0.1)
Basophils Absolute: 0.1 10*3/uL (ref 0.0–0.1)
Basophils Relative: 1 %
Eosinophils Absolute: 0.1 10*3/uL (ref 0.0–0.7)
Eosinophils Relative: 1 %
HEMATOCRIT: 31.6 % — AB (ref 39.0–52.0)
HEMOGLOBIN: 10 g/dL — AB (ref 13.0–17.0)
Immature Granulocytes: 0 %
LYMPHS ABS: 3.4 10*3/uL (ref 0.7–4.0)
LYMPHS PCT: 32 %
MCH: 28.7 pg (ref 26.0–34.0)
MCHC: 31.6 g/dL (ref 30.0–36.0)
MCV: 90.8 fL (ref 78.0–100.0)
MONO ABS: 0.8 10*3/uL (ref 0.1–1.0)
MONOS PCT: 7 %
NEUTROS ABS: 6.1 10*3/uL (ref 1.7–7.7)
Neutrophils Relative %: 59 %
Platelets: 525 10*3/uL — ABNORMAL HIGH (ref 150–400)
RBC: 3.48 MIL/uL — ABNORMAL LOW (ref 4.22–5.81)
RDW: 13.4 % (ref 11.5–15.5)
WBC: 10.4 10*3/uL (ref 4.0–10.5)

## 2017-10-15 LAB — GLUCOSE, CAPILLARY
GLUCOSE-CAPILLARY: 129 mg/dL — AB (ref 70–99)
GLUCOSE-CAPILLARY: 129 mg/dL — AB (ref 70–99)
GLUCOSE-CAPILLARY: 133 mg/dL — AB (ref 70–99)
Glucose-Capillary: 152 mg/dL — ABNORMAL HIGH (ref 70–99)

## 2017-10-15 NOTE — Progress Notes (Signed)
Occupational Therapy Session Note  Patient Details  Name: [name] MRN: [id number] Date of Birth: 1961/01/16  Today's Date: 10/15/2017 OT Individual Time: 8657-8469 OT Individual Time Calculation (min): 60 min    Short Term Goals: Week 1:  OT Short Term Goal 1 (Week 1): STG=LTG due to LOS  Skilled Therapeutic Interventions/Progress Updates:    Pt seen for OT session focusing on functional transfers and ambulation. Pt asleep in supine upon arrival, required increased time and encouragement to wake up-and participate. Pt admitted during session being upset therapist keep waking him up for therapies. Reviewed point of IPR and wrote out schedule in easy to read format for pt to be able to plan naps and know when therapy sessions are scheduled.  He Transferred to EOB mod I using hospital bed functions, completed supervision squat pivot transfer to w/c.  He declined need for bathing/dressing this morning. He self propelled w/c throughout unit with supervision and VCs for effective propulsion technique, however, very slow propulsion speed and rest breaks required.  In ADL apartment, education and demonstration provided for transfer technique to tub/transfer bench as pt has at home. Pt ambulated into bathroom using RW with CGA and completed transfer with supervision once seated.  He then completed transfer to standard toilet, encouraged not using of grab bar as he doesn't have one at home. Completed with CGA. Pt would benefit from [facility] over toilet to have access to armrests as well as elevated seating surface for easier transfers. Pt with continent BM while completing task, hygiene and clothing management completed standing at RW with min steadying assist. Discussed use of lateral leans for toileting task in order to increase safety though pt declined to attempt.  He self propelled w/c back to room. With encouragement to build OOB tolerance, pt agreeable to sit up in recliner. Ambulated ~39ft to  recliner. Pt left seated in recliner at end of session with all needs in reach and NT present.   Therapy Documentation Precautions:  Precautions Precautions: Fall Precaution Comments: wound VAC Required Braces or Orthoses: Other Brace/Splint Other Brace/Splint: L leg limb guard Restrictions Weight Bearing Restrictions: Yes LLE Weight Bearing: Non weight bearing Pain: Pain Assessment Pain Score: 0-No pain Faces Pain Scale: No hurt  See Function Navigator for Current Functional Status.   Therapy/Group: Individual Therapy  Casha Estupinan L 10/15/2017, 7:05 AM

## 2017-10-15 NOTE — Progress Notes (Signed)
Physical Therapy Session Note  Patient Details  Name: [name] MRN: [id number] Date of Birth: [birth year]  Today's Date: 10/15/2017 PT Individual Time: 0800-0900 PT Individual Time Calculation (min): 60 min   Short Term Goals: Week 1:  PT Short Term Goal 1 (Week 1): =LTG due to estimated LOS  Skilled Therapeutic Interventions/Progress Updates:    Pt reported 9/10 LLE leg pain near incision site prior to treatment session onset. Pt performed supine>sitting EOB with supervision. PT donned limb guard and initiated squat pivot transfer>w/c providing CGA with Pt demonstrating correct sequencing and safety considerations during transfer. Pt performed grooming at sink with set up assist requiring increased time to perform ADL task. Pt self propelled w/c>treatment gym for 150 feet using B UE's and supervision. PT initiated short distance ambulation with RW for 10 feet providing CGA. Pt performed ambulatory transfer to edge of mat and doffed shrinker for therapist to assess healing of L LE incision line. Therapist notes incision is progressing well with healing and notes LLE limb shrinkage inc which will require shrinker to be resized for better fit. PT assisted donning of shrinker using donning ring for efficiency with task providing total A. Therapist initiated L LE there ex for strengthening including; 3x15 reps long arc quads, hip extension with bolster, gluteal isometrics for 10 reps/5 sec holds, and supine hip add/abd with towel and band around distal femurs for 10 reps with 5 sec hold for each exercise. Pt performed squat pivot transfer from mat>w/c with therapist providing CGA. Therapist propelled w/c back to Pt room for time efficiency and initiated squat pivot from w/c>bed providing CGA.Pt was positioned in supine with tray table and call bell in reach and all needs met.   Therapy Documentation Precautions:  Precautions Precautions: Fall Precaution Comments: wound VAC Required Braces or  Orthoses: Other Brace/Splint Other Brace/Splint: L leg limb guard Restrictions Weight Bearing Restrictions: Yes LLE Weight Bearing: Non weight bearing  See Function Navigator for Current Functional Status.   Therapy/Group: Individual Therapy  Floreen Comber 10/15/2017, 10:12 AM

## 2017-10-15 NOTE — Progress Notes (Signed)
Occupational Therapy Session Note  Patient Details  Name: [name] MRN: [id number] Date of Birth: 12-Apr-1960  Today's Date: 10/15/2017 OT Individual Time: 1130-1200 and 1300-1400 OT Individual Time Calculation (min): 30 min and 60 min    Short Term Goals: Week 1:  OT Short Term Goal 1 (Week 1): STG=LTG due to LOS  Skilled Therapeutic Interventions/Progress Updates:    Session 1: Upon entering the room, pt seated in recliner chair with no c/o pain this session. OT provided pt with paper handout for B UE strengthening HEP. OT demonstrated and reviewed HEP with use of orange level 2 resistive band. Pt returning demonstrations with min verbal cues for proper technique. Pt performed 2 sets of 10 chest pulls, alternating punches, shoulder diagonals, shoulder flexion, and bicep curls. Pt needing multiple rest breaks secondary to muscle fatigue. Pt remained in recliner chair with lunch tray set up with call bell and all needed items within reach. Chair alarm activated.   Session 2: Upon entering the room, pt seated in recliner chair and agreeable to OT intervention. Pt performing squat pivot transfer from recliner >wheelchair with min A. OT assisted pt into bathroom via wheelchair and pt utilized grab bar with min verbal cues for safety awareness to lock wheelchair breaks and set up. Min A squat pivot transfer and pt able to have BM. Pt able to perform hygiene with min A for standing balance. Pt returning to wheelchair in same manner and transferring into TTB with use of grab bar and min A squat pivot. Pt performs lateral leans to wash buttocks and peri area. Pt needing steady assistance when washing R foot. Pt transferred back into wheelchair and performed grooming with supervision at sink. Pt donning pull over shirt with set up A and donned R sock with steady assistance for safety. Pt transferred to bed and donned pull over pants with lateral leans on EOB. Pt returning to supine with supervision.  Call bell and all needed items within reach upon exiting the room.   Therapy Documentation Precautions:  Precautions Precautions: Fall Precaution Comments: wound VAC Required Braces or Orthoses: Other Brace/Splint Other Brace/Splint: L leg limb guard Restrictions Weight Bearing Restrictions: Yes LLE Weight Bearing: Non weight bearing Pain: Pain Assessment Pain Score: 0-No pain Faces Pain Scale: No hurt  See Function Navigator for Current Functional Status.   Therapy/Group: Individual Therapy  Gypsy Decant 10/15/2017, 12:35 PM

## 2017-10-15 NOTE — Progress Notes (Addendum)
[facility] PHYSICAL MEDICINE & REHABILITATION     PROGRESS NOTE  Subjective/Complaints:  Patient seen sitting up in his bed this morning. He states he slept well overnight. He states he had a good night. He states he had a good weekend.  ROS: Denies CP, SOB, N/V/D  Objective: Vital Signs: Blood pressure 101/67, pulse 63, temperature 98.3 F (36.8 C), temperature source Oral, resp. rate 16, height 5\' 7"  (1.702 m), weight 85.3 kg (188 lb 0.8 oz), SpO2 97 %. No results found. No results for input(s): WBC, HGB, HCT, PLT in the last 72 hours. No results for input(s): NA, K, CL, GLUCOSE, BUN, CREATININE, CALCIUM in the last 72 hours.  Invalid input(s): CO CBG (last 3)  Recent Labs    10/14/17 1633 10/14/17 2211 10/15/17 0655  GLUCAP 148* 142* 129*    Wt Readings from Last 3 Encounters:  10/09/17 85.3 kg (188 lb 0.8 oz)  10/02/17 102.5 kg (226 lb)  05/28/17 100.2 kg (221 lb)    Physical Exam:  BP 101/67 (BP Location: Right Arm)   Pulse 63   Temp 98.3 F (36.8 C) (Oral)   Resp 16   Ht 5\' 7"  (1.702 m)   Wt 85.3 kg (188 lb 0.8 oz)   SpO2 97%   BMI 29.45 kg/m  Constitutional: No distress . Vital signs reviewed. HENT: Normocephalic.  Atraumatic. Eyes: EOMI. No discharge. Cardiovascular: RRR. No JVD. Respiratory: CTA Bilaterally. Normal effort. GI: BS +. Non-distended. Musculoskeletal:L-BKA TTP with edema Neurological: He isalertand oriented.  Motor: B/l UE 5/5.  RLE 4-4/5 proximal to distal.  LLE 4-/5 HF (pain inhibition) Skin:Left BKA incision with staples c/d/i Well healed old scars on right knee and left forearm from prior injuries. Psychiatric:  Normal mood. Normal behavior.  Assessment/Plan: 1. Functional deficits secondary to left BKA which require 3+ hours per day of interdisciplinary therapy in a comprehensive inpatient rehab setting. Physiatrist is providing close team supervision and 24 hour management of active medical problems listed  below. Physiatrist and rehab team continue to assess barriers to discharge/monitor patient progress toward functional and medical goals.  Function:  Bathing Bathing position   Position: Wheelchair/chair at sink  Bathing parts Body parts bathed by patient: Right arm, Left arm, Chest, Abdomen, Front perineal area, Right upper leg Body parts bathed by helper: Buttocks, Right lower leg, Back  Bathing assist Assist Level: Touching or steadying assistance(Pt > 75%)      Upper Body Dressing/Undressing Upper body dressing   What is the patient wearing?: Pull over shirt/dress     Pull over shirt/dress - Perfomed by patient: Thread/unthread right sleeve, Thread/unthread left sleeve, Put head through opening, Pull shirt over trunk Pull over shirt/dress - Perfomed by helper: Pull shirt over trunk        Upper body assist Assist Level: Supervision or verbal cues      Lower Body Dressing/Undressing Lower body dressing   What is the patient wearing?: Pants, Non-skid slipper socks     Pants- Performed by patient: Thread/unthread right pants leg, Thread/unthread left pants leg Pants- Performed by helper: Pull pants up/down Non-skid slipper socks- Performed by patient: Don/doff left sock(R n/a) Non-skid slipper socks- Performed by helper: Don/doff left sock                  Lower body assist Assist for lower body dressing: Touching or steadying assistance (Pt > 75%)      Toileting Toileting   Toileting steps completed by patient: Adjust clothing prior to  toileting, Performs perineal hygiene, Adjust clothing after toileting Toileting steps completed by helper: Adjust clothing prior to toileting, Performs perineal hygiene, Adjust clothing after toileting Toileting Assistive Devices: Grab bar or rail  Toileting assist Assist level: Touching or steadying assistance (Pt.75%)   Transfers Chair/bed transfer   Chair/bed transfer method: Squat pivot Chair/bed transfer assist level:  Supervision or verbal cues Chair/bed transfer assistive device: Armrests     Locomotion Ambulation     Max distance: 10' Assist level: Touching or steadying assistance (Pt > 75%)   Wheelchair   Type: Manual Max wheelchair distance: 150' Assist Level: Supervision or verbal cues  Cognition Comprehension Comprehension assist level: Follows basic conversation/direction with no assist  Expression Expression assist level: Expresses complex 90% of the time/cues < 10% of the time  Social Interaction Social Interaction assist level: Interacts appropriately with others with medication or extra time (anti-anxiety, antidepressant).  Problem Solving Problem solving assist level: Solves basic 90% of the time/requires cueing < 10% of the time  Memory Memory assist level: Recognizes or recalls 75 - 89% of the time/requires cueing 10 - 24% of the time    Medical Problem List and Plan: 1.Functional deficitssecondary to left BKA  Continue CIR  2. DVT Prophylaxis/Anticoagulation: Pharmaceutical:Lovenox 3. Pain Management:Continue oxycodone prn with Neurontin tid for neuropathy/phantom pain. 4. Mood:LCSW to follow for evaluation and support. 5. Neuropsych: This patientiscapable of making decisions on hisown behalf. 6. Skin/Wound Care:dry dressing/shrinker sock daily 7. Fluids/Electrolytes/Nutrition: Monitor I/Os.  BMP within acceptable range, except for glucose on 7/10 8. T2DM: Hgb A1c- 11.4-- poorly controlled. Was on Lantus 75 units at home with metformin 2000 mg daily PTA.   -improving control but still suboptimal  Lantus 15 units, increased to 25 on 7/11, increase to 28u on 7/14  Resumed metformin at 1000 mg bid  9. ABLA on Anemia of chronic illness?:   Hb 9.6 on 7/10  Labs pending  Cont to monitor 10. HTN: Monitor BP bid. Continue Norvasc.   Controlled on 7/15 11. Thrombocytosis: Likely reactive and Lovenox added. Continue to monitor for recovery.   Plts 580 on 7/10  Labs  pending  Cont to monitor 12. Constipation: Increased bowel reg on 7/10.   LOS (Days) 6 A FACE TO FACE EVALUATION WAS PERFORMED  Hiroyuki Ozanich Lorie Phenix 10/15/2017 7:59 AM

## 2017-10-16 ENCOUNTER — Inpatient Hospital Stay (HOSPITAL_COMMUNITY): Payer: Medicaid Other | Admitting: Occupational Therapy

## 2017-10-16 ENCOUNTER — Inpatient Hospital Stay (HOSPITAL_COMMUNITY): Payer: Medicaid Other | Admitting: Physical Therapy

## 2017-10-16 LAB — GLUCOSE, CAPILLARY
GLUCOSE-CAPILLARY: 156 mg/dL — AB (ref 70–99)
GLUCOSE-CAPILLARY: 122 mg/dL — AB (ref 70–99)
Glucose-Capillary: 122 mg/dL — ABNORMAL HIGH (ref 70–99)
Glucose-Capillary: 155 mg/dL — ABNORMAL HIGH (ref 70–99)

## 2017-10-16 NOTE — Progress Notes (Signed)
Social Work Patient ID: [name], male   DOB: 16-Jun-1960, 57 y.o.   MRN: [id number]  Have scheduled family education with pt's son tomorrow from 11-12 and 2-3 pm.  Team aware of the plan.
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
## 2017-10-16 NOTE — Progress Notes (Signed)
Occupational Therapy Session Note  Patient Details  Name: [name] MRN: [id number] Date of Birth: June 16, 1960  Today's Date: 10/16/2017 OT Individual Time: 9417-4081 and 1415-1500 OT Individual Time Calculation (min): 60 min and 45 min   Short Term Goals: Week 1:  OT Short Term Goal 1 (Week 1): STG=LTG due to LOS  Skilled Therapeutic Interventions/Progress Updates:    Session One: Pt seen for OT session focusing on functional transfers, w/c propulsion, functional ambulation and education. Pt asleep in supine upon arrival, increased time and encouragement to awaken. He donned limb guard in long sitting with increased time.  Throughout session, completed squat pivot transfers with close supervision and mod cuing for proper set-up of w/c in prep for transfers with pt requiring increased time to problem solve how to manage w/c parts.  With encouragement, he completed oral care from standing position with heavy reliance on UEs for balance and maintaining UE support throughout task.  He self propelled w/c off unit with supervision, education provided for community w/c management including backing into elevators. Outside, pt transferred onto park bench in manner described above. While resting on bench, education and discussion regarding return to activity, meaning occupations, importance of smoking cessation to promote optimal healing, amputee support group, continuum of care, and d/c planning. Pt voices feeling "ready enough" for planned d/c home Thursday though was unable to verbalize concerns.  He completed short distance ambulation outside over bricks in prep for community re-entry, completed with CGA. Limited ambulation as pt does not have shoes and wearing non-skid sock. Pt taken back to room total A in w/c at end of session, requesting return to supine. Ambulated ~69ft back to bed. Pt left in supine at end of session, all needs in reach. Encouraged pt to think about what he would like to  work on during PM session. Pt reports wanting to "smoke a cigarette"... Cont on with education.   Session Two: PT seen for OT session focusing on functional transfers and standing balance/endurance. Pt sitting up in w/c upon arrival, agreeable to tx session. He self propelled w/c to therapy gym with supervision. Session focus on w/c set-up in prep for transfer. Completed x3 during session, pt completing set-up x2 trial and directing caregiver with 3rd trial. Pt initially requiring max cuing for proper set-up of w/c. Pt stating " I can do the transfer with the way it's set-up", education provided regarding setting up environment in safest most efficient method possible to reduce fall risk. During last trial pt able to set-up with min cuing.  In supine, completed x1 set of 10 L LE raises and x10 ABduction. While pt completing exercises, therapist spoke with CSW and pt regarding having son come in for hands on training in prep for d/c home- family ed planned for tomorrow. Pt completed dynamic standing task from EOM, removing clothes pins placed around waist band in simulation of LB dressing task. Completed x3 trials, overall CGA, however, one episode of LOB to L requiring mod A to regain balance. Seated rest breaks provided btwn trials. Pt returned to room at end of session, request to return to supine, left with all needs in reach and bed alarm on.   Therapy Documentation Precautions:  Precautions Precautions: Fall Precaution Comments: wound VAC Required Braces or Orthoses: Other Brace/Splint Other Brace/Splint: L leg limb guard Restrictions Weight Bearing Restrictions: Yes LLE Weight Bearing: Non weight bearing  See Function Navigator for Current Functional Status.   Therapy/Group: Individual Therapy  [name] L 10/16/2017, 6:50  AM

## 2017-10-16 NOTE — Progress Notes (Signed)
Recreational Therapy Session Note  Patient Details  Name: [name] MRN: [id number] Date of Birth: [birth year] Today's Date: 10/16/2017  Order received.  Pt with scheduled discharge for Thursday 7/18, TR eval deferred. [place] 10/16/2017, 1:51 PM

## 2017-10-16 NOTE — Progress Notes (Signed)
Physical Therapy Session Note  Patient Details  Name: Jahson Emanuele MRN: [id number] Date of Birth: Aug 06, 1960  Today's Date: 10/17/2017 PT Individual Time:  - 1300-1400   PT Individual Minutes: 60 min  Short Term Goals: Week 1:  PT Short Term Goal 1 (Week 1): =LTG due to estimated LOS  Skilled Therapeutic Interventions/Progress Updates: Pt received supine in bed, denies pain and agreeable to treatment. Supine>sit modI. Squat pivot transfer S to w/c. W/c propulsion 2x150' with BUE and modI. Performed ascent/descent with one crutch and rail with modA +2 for safety and pt fearful. Trialed with crutch on both sides, and with crutch on bottom or step above; requires significantly increased time to initiate and perform regardless of setup as pt doesn't trust crutch and rail to hold him. Ultimately discussed need for son to come in to trial bumping up stairs. Therapist called pt's son however no answer and unavailable voicemail. Pt performed couch and bed transfer in ADL apartment with min cues for w/c setup, S for transfer. Returned to room totalA. Remained in w/c at end of session; required encouragement to remain up in chair for 15 minute break before next session.      Therapy Documentation Precautions:  Precautions Precautions: Fall Precaution Comments: wound VAC Required Braces or Orthoses: Other Brace/Splint Other Brace/Splint: L leg limb guard Restrictions Weight Bearing Restrictions: Yes LLE Weight Bearing: Non weight bearing   See Function Navigator for Current Functional Status.   Therapy/Group: Individual Therapy  [name] 10/17/2017, 7:57 AM

## 2017-10-16 NOTE — Progress Notes (Signed)
Occupational Therapy Session Note  Patient Details  Name: [name] MRN: [id number] Date of Birth: 1961/03/11  Today's Date: 10/16/2017 OT Individual Time: 5701-7793 OT Individual Time Calculation (min): 30 min    Short Term Goals: Week 1:  OT Short Term Goal 1 (Week 1): STG=LTG due to LOS      Skilled Therapeutic Interventions/Progress Updates:    pt received in bed and agreeable to therapy.  Pt sat to EOB and completed a squat pivot to w/c with supervision.  He was taken to day room to work on UE strength and AROM on UBE exercises bike for 12 min at level 4.  He then work on self propelling his w/c back to his room. Pt went about 50 ft and then became tired.  Therapist assisted the rest of the way back to the room.  Pt then transferred back to bed with S.  Bed alarm set and all needs met.  Therapy Documentation Precautions:  Precautions Precautions: Fall Precaution Comments: wound VAC Required Braces or Orthoses: Other Brace/Splint Other Brace/Splint: L leg limb guard Restrictions Weight Bearing Restrictions: Yes LLE Weight Bearing: Non weight bearing   Pain: no c/o pain   ADL:   See Function Navigator for Current Functional Status.   Therapy/Group: Individual Therapy  [place] 10/16/2017, 12:22 PM

## 2017-10-17 ENCOUNTER — Inpatient Hospital Stay (HOSPITAL_COMMUNITY): Payer: Medicaid Other | Admitting: Occupational Therapy

## 2017-10-17 ENCOUNTER — Inpatient Hospital Stay (HOSPITAL_COMMUNITY): Payer: Medicaid Other | Admitting: Physical Therapy

## 2017-10-17 LAB — GLUCOSE, CAPILLARY
GLUCOSE-CAPILLARY: 119 mg/dL — AB (ref 70–99)
GLUCOSE-CAPILLARY: 127 mg/dL — AB (ref 70–99)
Glucose-Capillary: 131 mg/dL — ABNORMAL HIGH (ref 70–99)
Glucose-Capillary: 118 mg/dL — ABNORMAL HIGH (ref 70–99)
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
## 2017-10-17 NOTE — Progress Notes (Signed)
Occupational Therapy Discharge Summary  Patient Details  Name: [name] MRN: [id number] Date of Birth: 02/26/1961   Patient has met 9 of 9 long term goals due to improved activity tolerance, improved balance, postural control, ability to compensate for deficits and improved coordination.  Patient to discharge at overall Supervision level.  Patient's care partner is independent to provide the necessary physical and cognitive assistance at discharge.  Pt reports baseline memory impairments effecting his ability to properly recall set-up of w/c in prep for transfers and some poor safety awareness. Hands on family education completed with pt's son. He voices and demonstrates willing and ableness to provide the 24 hr supervision assist which is recommended at d/c.   Recommendation:  Patient will benefit from ongoing skilled OT services in home health setting to continue to advance functional skills in the area of BADL, iADL and Reduce care partner burden.  Equipment: drop arm BSC and tub transfer bench  Reasons for discharge: treatment goals met and discharge from hospital  Patient/family agrees with progress made and goals achieved: Yes  OT Discharge Precautions/Restrictions  Precautions Precautions: Fall Required Braces or Orthoses: Other Brace/Splint Other Brace/Splint: L leg limb guard Restrictions Weight Bearing Restrictions: Yes LLE Weight Bearing: Non weight bearing Vision Baseline Vision/History: (Reports visual changes 2/2 diabetes, however, does not own glassess) Patient Visual Report: No change from baseline Vision Assessment?: No apparent visual deficits Perception  Perception: Within Functional Limits Praxis Praxis: Intact Cognition Overall Cognitive Status: Within Functional Limits for tasks assessed Arousal/Alertness: Awake/alert Orientation Level: Oriented X4 Memory: Impaired Memory Impairment: Decreased recall of new information Awareness: Appears  intact Problem Solving: Impaired Problem Solving Impairment: Verbal basic;Functional basic Behaviors: Impulsive Safety/Judgment: Impaired Comments: Slightly impulsive with mobility. Reports baseline memory impairments which is now effecting his ability to recall new education Sensation Sensation Light Touch: Appears Intact Coordination Gross Motor Movements are Fluid and Coordinated: Yes Fine Motor Movements are Fluid and Coordinated: Yes Motor  Motor Motor: Within Functional Limits Motor - Discharge Observations: Generalized weakness; impaired due to recent amputation Trunk/Postural Assessment  Cervical Assessment Cervical Assessment: Within Functional Limits Thoracic Assessment Thoracic Assessment: Exceptions to WFL(Kyphotic) Lumbar Assessment Lumbar Assessment: Exceptions to WFL(Posterior pelvic tilt) Postural Control Postural Control: Deficits on evaluation(2/2 recent amputation)  Balance Balance Balance Assessed: Yes Static Sitting Balance Static Sitting - Balance Support: No upper extremity supported;Feet supported Static Sitting - Level of Assistance: 6: Modified independent (Device/Increase time) Static Sitting - Comment/# of Minutes: Sitting EOB Dynamic Sitting Balance Dynamic Sitting - Balance Support: During functional activity;Feet supported Dynamic Sitting - Level of Assistance: 6: Modified independent (Device/Increase time);5: Stand by assistance Static Standing Balance Static Standing - Balance Support: Right upper extremity supported;Left upper extremity supported;During functional activity Static Standing - Level of Assistance: 5: Stand by assistance Static Standing - Comment/# of Minutes: Standing with RW Dynamic Standing Balance Dynamic Standing - Balance Support: During functional activity;Right upper extremity supported;Left upper extremity supported Dynamic Standing - Level of Assistance: 5: Stand by assistance;4: Min assist Dynamic Standing - Comments:  Standing at RW during ADL tasks Extremity/Trunk Assessment RUE Assessment RUE Assessment: Within Functional Limits LUE Assessment LUE Assessment: Within Functional Limits   See Function Navigator for Current Functional Status.  Rosaura Bolon L 10/17/2017, 7:29 AM

## 2017-10-17 NOTE — Progress Notes (Signed)
[place] PHYSICAL MEDICINE & REHABILITATION     PROGRESS NOTE  Subjective/Complaints:   No issues overnite Request additional stump shrinker  ROS: Denies CP, SOB, N/V/D  Objective: Vital Signs: Blood pressure 110/74, pulse 70, temperature 98.3 F (36.8 C), temperature source Oral, resp. rate 17, height 5\' 7"  (1.702 m), weight 85.3 kg (188 lb 0.8 oz), SpO2 99 %. No results found. Recent Labs    10/15/17 0802  WBC 10.4  HGB 10.0*  HCT 31.6*  PLT 525*   Recent Labs    10/15/17 0802  NA 136  K 4.3  CL 102  GLUCOSE 170*  BUN 16  CREATININE 0.85  CALCIUM 9.2   CBG (last 3)  Recent Labs    10/16/17 1657 10/16/17 2135 10/17/17 0634  GLUCAP 122* 155* 131*    Wt Readings from Last 3 Encounters:  10/09/17 85.3 kg (188 lb 0.8 oz)  10/02/17 102.5 kg (226 lb)  05/28/17 100.2 kg (221 lb)    Physical Exam:  BP 110/74 (BP Location: Right Arm)   Pulse 70   Temp 98.3 F (36.8 C) (Oral)   Resp 17   Ht 5\' 7"  (1.702 m)   Wt 85.3 kg (188 lb 0.8 oz)   SpO2 99%   BMI 29.45 kg/m  Constitutional: No distress . Vital signs reviewed. HENT: Normocephalic.  Atraumatic. Eyes: EOMI. No discharge. Cardiovascular: RRR. No JVD. Respiratory: CTA Bilaterally. Normal effort. GI: BS +. Non-distended. Musculoskeletal:L-BKA TTP with edema, improving Neurological: He isalertand oriented.  Motor: B/l UE 5/5.  RLE 4+/5 proximal to distal.  LLE 4/5 HF (pain inhibition) Skin:Left BKA incision with staples c/d/i, mild/mod edema Well healed old scars on right knee and left forearm from prior injuries. Psychiatric:  Normal mood. Normal behavior.  Assessment/Plan: 1. Functional deficits secondary to left BKA which require 3+ hours per day of interdisciplinary therapy in a comprehensive inpatient rehab setting. Physiatrist is providing close team supervision and 24 hour management of active medical problems listed below. Physiatrist and rehab team continue to assess barriers to  discharge/monitor patient progress toward functional and medical goals.  Function:  Bathing Bathing position   Position: Wheelchair/chair at sink  Bathing parts Body parts bathed by patient: Right arm, Left arm, Chest, Abdomen, Front perineal area, Right upper leg, Buttocks, Left upper leg, Right lower leg Body parts bathed by helper: Back  Bathing assist Assist Level: Touching or steadying assistance(Pt > 75%)      Upper Body Dressing/Undressing Upper body dressing   What is the patient wearing?: Pull over shirt/dress     Pull over shirt/dress - Perfomed by patient: Thread/unthread right sleeve, Thread/unthread left sleeve, Put head through opening, Pull shirt over trunk Pull over shirt/dress - Perfomed by helper: Pull shirt over trunk        Upper body assist Assist Level: Supervision or verbal cues, Set up      Lower Body Dressing/Undressing Lower body dressing   What is the patient wearing?: Pants, Non-skid slipper socks     Pants- Performed by patient: Thread/unthread right pants leg, Thread/unthread left pants leg, Pull pants up/down Pants- Performed by helper: Pull pants up/down Non-skid slipper socks- Performed by patient: Don/doff left sock Non-skid slipper socks- Performed by helper: Don/doff left sock                  Lower body assist Assist for lower body dressing: Touching or steadying assistance (Pt > 75%)      Toileting Toileting   Toileting steps  completed by patient: Adjust clothing prior to toileting, Performs perineal hygiene, Adjust clothing after toileting Toileting steps completed by helper: Adjust clothing prior to toileting, Performs perineal hygiene, Adjust clothing after toileting Toileting Assistive Devices: Grab bar or rail, Other (comment)(RW)  Toileting assist Assist level: Touching or steadying assistance (Pt.75%)   Transfers Chair/bed transfer   Chair/bed transfer method: Squat pivot Chair/bed transfer assist level: Touching or  steadying assistance (Pt > 75%) Chair/bed transfer assistive device: Armrests     Locomotion Ambulation     Max distance: 10 Assist level: Touching or steadying assistance (Pt > 75%)   Wheelchair   Type: Manual Max wheelchair distance: 150' Assist Level: Supervision or verbal cues  Cognition Comprehension Comprehension assist level: Follows basic conversation/direction with no assist  Expression Expression assist level: Expresses complex 90% of the time/cues < 10% of the time  Social Interaction Social Interaction assist level: Interacts appropriately with others with medication or extra time (anti-anxiety, antidepressant).  Problem Solving Problem solving assist level: Solves basic 90% of the time/requires cueing < 10% of the time  Memory Memory assist level: Recognizes or recalls 75 - 89% of the time/requires cueing 10 - 24% of the time    Medical Problem List and Plan: 1.Functional deficitssecondary to left BKA  Continue CIR   Planned for discharge on Tomorrow Will see patient for transitional care management in 1-2 weeks post discharge. 2. DVT Prophylaxis/Anticoagulation: Pharmaceutical:Lovenox 3. Pain Management:Continue oxycodone prn with Neurontin tid for neuropathy/phantom pain.No C/os 4. Mood:LCSW to follow for evaluation and support. 5. Neuropsych: This patientiscapable of making decisions on hisown behalf. 6. Skin/Wound Care:dry dressing/shrinker sock daily 7. Fluids/Electrolytes/Nutrition: Monitor I/Os.  BMP within acceptable range, except for glucose on 7/10 8. T2DM: Hgb A1c- 11.4-- poorly controlled. Was on Lantus 75 units at home with metformin 2000 mg daily PTA.   -improving control but still suboptimal  Lantus 15 units, increased to 25 on 7/11, increase to 28u on 7/14  Resumed metformin at 1000 mg bid  CBG (last 3)  Recent Labs    10/16/17 1657 10/16/17 2135 10/17/17 0634  GLUCAP 122* 155* 131*   9. ABLA on Anemia of chronic illness?:   Hb  10.0 on 7/15  Cont to monitor 10. HTN: Monitor BP bid. Continue Norvasc.  Vitals:   10/16/17 2004 10/17/17 0520  BP: 117/72 110/74  Pulse: 71 70  Resp: 17 17  Temp: 98 F (36.7 C) 98.3 F (36.8 C)  SpO2: 99% 99%    Controlled on 7/17 11. Thrombocytosis: Likely reactive   Continue to monitor for recovery.   Plts 525 on 7/15  Cont to monitor 12. Constipation: denies problem LOS (Days) 8 A FACE TO FACE EVALUATION WAS PERFORMED  Charlett Blake 10/17/2017 7:38 AM

## 2017-10-17 NOTE — Progress Notes (Signed)
Orthopedic Tech Progress Note Patient Details:  [name] 05-19-1960 [id number]  Patient ID: [name], male   DOB: 20-Apr-1960, 57 y.o.   MRN: [id number]   Hildred Priest 10/17/2017, 9:42 AM Called in bio-tech brace order; spoke with [facility]

## 2017-10-17 NOTE — Progress Notes (Signed)
Occupational Therapy Session Note  Patient Details  Name: [name] MRN: [id number] Date of Birth: [birth year]  Today's Date: 10/17/2017 OT Individual Time: 0845-1000 OT Individual Time Calculation (min): 75 min    Short Term Goals: Week 1:  OT Short Term Goal 1 (Week 1): STG=LTG due to LOS  Skilled Therapeutic Interventions/Progress Updates:    Pt seen for OT ADL bathing/dressing session. Pt awake in supine upon arrival, agreeable to tx session. Voiced "minimal" pain at surgical site, reports being pre-medicated prior to tx session and willing to cont on with session.  Throughout session, he completed squat pivot and stand pivot transfers with supervision. He bathed seated on tub bench in shower, lateral leans to complete buttock hygiene. Pt able to maintain dynamic sitting balance to reach forward to wash LE with UE support for balance. He returned to w/c to dress. Noted minimal drainage at surgical site, RN made aware and dressing applied prior to shrinker and limb guard. R LE propped on stool for pt to be able to reach foot to don sock.  Following seated rest break, he ambulated ~30ft into bathroom using RW to complete toilet transfer in simulation of home functional ambulation. Completed with close supervision. Pt able to independently set-up w/c in prep for transfer. Supervision squat pivot to return to bed. Education provided regarding Equities trader, importance of changing out shrinker, and maintaining skin integrity for B UEs.  Pt left in supine at end of session, bed alarm on and all needs in reach.   Therapy Documentation Precautions:  Precautions Precautions: Fall Precaution Comments: wound VAC Required Braces or Orthoses: Other Brace/Splint Other Brace/Splint: L leg limb guard Restrictions Weight Bearing Restrictions: Yes LLE Weight Bearing: Non weight bearing Pain: Pain Assessment Pain Score: 3   See Function Navigator for Current Functional  Status.   Therapy/Group: Individual Therapy  Marlena Barbato L 10/17/2017, 7:08 AM

## 2017-10-17 NOTE — Progress Notes (Signed)
Occupational Therapy Session Note  Patient Details  Name: [name] MRN: [id number] Date of Birth: 1961/01/09  Today's Date: 10/17/2017 OT Individual Time: 1100-1150 OT Individual Time Calculation (min): 50 min    Short Term Goals: Week 1:  OT Short Term Goal 1 (Week 1): STG=LTG due to LOS      Skilled Therapeutic Interventions/Progress Updates:    Pt seen this session for transfer training review with pt's son and his daughter in law.  Demonstrated w/c set up to family with ability to doff brake extenders as needed, moving and placing leg rests, position of w/c to prepare for transfers.   Demonstrated pt transferring using a stand pivot from bed to w/c, then to elevated toilet.  Return demonstration from family from toilet back to w/c.   [employer] arrived to bring new limb shrinkers and demonstrated to family how to don. Pt taken to tub room to simulate set up of their home bathroom. Pt will need to transfer from w/c to toilet and then onto tub bench. Pt was able to do so with family providing S.  Pt is able to complete all transfers with S and emphasized to family how important it is for pt to have 24 hr S as the pt is at a high fall risk due to his difficulty with safe w/c set up for transfers. Pt's family stated that they understood.   The family had to leave early today for a family event and would not be able to stay for afternoon session.  Pt's PT agreed to start family ed with family at 11:50.   Therapy Documentation Precautions:  Precautions Precautions: Fall Precaution Comments: wound VAC Required Braces or Orthoses: Other Brace/Splint Other Brace/Splint: L leg limb guard Restrictions Weight Bearing Restrictions: Yes LLE Weight Bearing: Non weight bearing    Vital Signs: Therapy Vitals Temp: 98.3 F (36.8 C) Temp Source: Oral Pulse Rate: 66 Resp: 17 BP: 110/64 Patient Position (if appropriate): Sitting Oxygen Therapy SpO2: 99 % O2 Device: Room  Air Pain: Pain Assessment Pain Scale: 0-10 Pain Score: 8  Pain Type: Acute pain Pain Location: Leg Pain Orientation: Left Pain Descriptors / Indicators: Aching Pain Frequency: Constant Pain Onset: On-going Patients Stated Pain Goal: 1 Pain Intervention(s): Medication (See eMAR) ADL:    See Function Navigator for Current Functional Status.   Therapy/Group: Individual Therapy  [place] 10/17/2017, 9:10 AM

## 2017-10-17 NOTE — Patient Care Conference (Signed)
Inpatient RehabilitationTeam Conference and Plan of Care Update Date: 10/17/2017   Time: 10:00 AM    Patient Name: [name]      Medical Record Number: [id number]  Date of Birth: 07/04/1960 Sex: Male         Room/Bed: 4W01C/4W01C-01 Payor Info: Payor: MEDICAID [place] / Plan: MEDICAID [place] ACCESS / Product Type: *No Product type* /    Admitting Diagnosis: L  BKA  Admit Date/Time:  10/09/2017  2:57 PM Admission Comments: No comment available   Primary Diagnosis:  Unilateral complete BKA, left, initial encounter ([place]) Principal Problem: Unilateral complete BKA, left, initial encounter [facility])  Patient Active Problem List   Diagnosis Date Noted  . Unilateral complete BKA, right, sequela ([place])   . Thrombocytosis ([place])   . Constipation due to pain medication   . S/P BKA (below knee amputation) unilateral, left ([place])   . Unilateral complete BKA, left, initial encounter ([place])   . Acute blood loss anemia   . Post-operative pain   . Benign essential HTN   . Tobacco abuse   . S/P amputation   . Subacute osteomyelitis, left ankle and foot ([place])   . Acute osteomyelitis of left calcaneus ([facility])   . Cutaneous abscess of left foot   . Severe protein-calorie malnutrition ([place])   . PAD (peripheral artery disease) ([place])   . Wound infection 10/02/2017  . Type 2 diabetes mellitus treated with insulin ([place])   . Tobacco use disorder   . Lymphedema 05/28/2017  . Essential hypertension 05/28/2017  . Poorly controlled type 2 diabetes mellitus ([place]) 05/28/2017  . DJD (degenerative joint disease) 05/28/2017  . Hyponatremia 04/05/2016  . Anemia 04/05/2016  . Gout 04/05/2016  . Bilateral foot pain 04/04/2016    Expected Discharge Date: Expected Discharge Date: 10/18/17  Team Members Present: Physician leading conference: Dr. Alysia Penna Social Worker Present: Ovidio Kin, LCSW Nurse Present: Other (comment)(Latoya troxler-LPN) PT Present: Kem Parkinson, PT OT Present: Napoleon Form, OT SLP Present: Windell Moulding, SLP PPS Coordinator present : Daiva Nakayama, RN, CRRN     Current Status/Progress Goal Weekly Team Focus  Medical   Good wound healing, no sig pain,   ensure good healing of BKA, HTN adn DM management  D/C planning   Bowel/Bladder   continent B&B. LBM 7/16  remain continent bowel and bladder  Assess bowel and bladder needs q shift and PRN   Swallow/Nutrition/ Hydration             ADL's   Supervision squat pivot transfers; min A dynamic standing balance during ADL tasks; CGA functional ambulation with RW  Supervision overall  Functional standing balance/endurance, functional transfers, d/c planning, amputee education   Mobility   S overall; modA stairs  S overall w/c level, short distance gait and stairs for home entry  amputee education, family training including bumping up/down stairs for home entry vs ambulance transfer   Communication             Safety/Cognition/ Behavioral Observations            Pain   C/O pain to LLE, scheduled Tylenol Q6h, Neurontin 300mg  TID, PRN Oxycodone 5-10mg  q4hr, Tramadol 50mg  q6h  pain <=3/10  assess pain qshift and PRN, medicate as needed   Skin   L BKA, staples intact  skin remain intact and free from breakdown  assess skin q shift and PRN      *See Care Plan and progress notes for long and short-term goals.  Barriers to Discharge  Current Status/Progress Possible Resolutions Date Resolved   Physician    Medical stability;Wound Care     progressing with rehab  plan D/C in am      Nursing                  PT                    OT                  SLP                SW                Discharge Planning/Teaching Needs:  HOme with son who will be here today to go through therapies and learn pt's care. Pt will need 24 hr supervision, son aware of this.      Team Discussion:  Progressing in his therapies toward supervision level. Son here for family training today. Wound looks good ordered stump  shrinker. Refused peer support and thinking about smoking cessation. Preparing for DC tomorrow. Medically stable for DC  Revisions to Treatment Plan:  DC 7/18    Continued Need for Acute Rehabilitation Level of Care: The patient requires daily medical management by a physician with specialized training in physical medicine and rehabilitation for the following conditions: Daily direction of a multidisciplinary physical rehabilitation program to ensure safe treatment while eliciting the highest outcome that is of practical value to the patient.: Yes Daily medical management of patient stability for increased activity during participation in an intensive rehabilitation regime.: Yes Daily analysis of laboratory values and/or radiology reports with any subsequent need for medication adjustment of medical intervention for : Blood pressure problems;Diabetes problems;Post surgical problems  Maycen Degregory, Gardiner Rhyme 10/17/2017, 2:30 PM

## 2017-10-17 NOTE — Progress Notes (Signed)
Social Work  Discharge Note  The overall goal for the admission was met for:   Discharge location: [place]  Length of Stay: Yes-9 DAYS  Discharge activity level: Yes-SUPERVISION LEVEL  Home/community participation: Yes  Services provided included: MD, RD, PT, OT, RN, CM, Pharmacy, Neuropsych and SW  Financial Services: Medicaid  Follow-up services arranged: Home Health: [facility], DME: [facility] and Patient/Family has no preference for HH/DME agencies  Comments (or additional information):SON AND DAUGHTER [place] TO ASSIST PT AT [place]. SON TO HAVE FRIEND PUT UP TEMPORARY RAMP TO GET INTO THE HOME TODAY.  Patient/Family verbalized understanding of follow-up arrangements: Yes  Individual responsible for coordination of the follow-up plan: SELF & Bra[place]-SON  Confirmed correct DME delivered: Elease Hashimoto 10/17/2017    Elease Hashimoto

## 2017-10-17 NOTE — Progress Notes (Signed)
Physical Therapy Discharge Summary  Patient Details  Name: Bode Pieper MRN: [id number] Date of Birth: [birth year]  Today's Date: 10/17/2017 PT Individual Time: 7622-6333 and 1155-1230  PT Individual Time Calculation (min): 30 min and 35 min (total 65 min)   Patient has met 9 of 10 long term goals due to improved activity tolerance, improved balance, improved postural control, increased strength and ability to compensate for deficits.  Patient to discharge at a wheelchair level Supervision, S for short distance ambulation.   Patient's care partner is independent to provide the necessary physical and cognitive assistance at discharge.  Reasons goals not met: all goals met  Recommendation:  Patient will benefit from ongoing skilled PT services in home health setting to continue to advance safe functional mobility, address ongoing impairments in strength, balance, activity tolerance, ROM, amputee education, prosthetic preparation, and minimize fall risk.  Equipment: w/c  Reasons for discharge: treatment goals met and discharge from hospital  Patient/family agrees with progress made and goals achieved: Yes  PT Discharge Precautions/Restrictions Precautions Precautions: Fall Required Braces or Orthoses: Other Brace/Splint Other Brace/Splint: L leg limb guard, shrinker Restrictions Weight Bearing Restrictions: Yes LLE Weight Bearing: Non weight bearing Vital Signs Therapy Vitals Temp: 98 F (36.7 C) Temp Source: Oral Pulse Rate: 66 Resp: 19 BP: 113/67 Patient Position (if appropriate): Lying Oxygen Therapy SpO2: 96 % O2 Device: Room Air Pain Pain Assessment Pain Scale: 0-10 Pain Score: 7  Pain Type: Acute pain Pain Location: Leg Pain Orientation: Left Pain Descriptors / Indicators: Aching Pain Frequency: Intermittent Pain Onset: On-going Patients Stated Pain Goal: 2 Pain Intervention(s): Medication (See eMAR) Vision/Perception  Perception Perception: Within  Functional Limits Praxis Praxis: Intact  Cognition Overall Cognitive Status: Within Functional Limits for tasks assessed Arousal/Alertness: Awake/alert Orientation Level: Oriented X4 Memory: Impaired Memory Impairment: Decreased recall of new information Awareness: Appears intact Problem Solving: Impaired Problem Solving Impairment: Verbal basic;Functional basic Behaviors: Impulsive Safety/Judgment: Impaired Comments: Slightly impulsive with mobility. Reports baseline memory impairments which is now effecting his ability to recall new education Sensation Sensation Light Touch: Appears Intact Coordination Gross Motor Movements are Fluid and Coordinated: Yes Fine Motor Movements are Fluid and Coordinated: Yes Motor  Motor Motor: Within Functional Limits Motor - Discharge Observations: Generalized weakness; impaired due to recent amputation  Mobility Bed Mobility Bed Mobility: Sit to Supine;Supine to Sit Supine to Sit: Independent Sit to Supine: Independent Transfers Transfers: Sit to Stand;Squat Pivot Transfers;Stand Pivot Transfers Sit to Stand: Supervision/Verbal cueing Stand Pivot Transfers: Supervision/Verbal cueing Squat Pivot Transfers: Supervision/Verbal cueing Transfer (Assistive device): Standard walker Locomotion  Gait Ambulation: Yes Gait Assistance: Supervision/Verbal cueing Gait Distance (Feet): 25 Feet Assistive device: Standard walker Gait Assistance Details: Verbal cues for precautions/safety;Verbal cues for technique Gait Gait: Yes Gait Pattern: Impaired Gait Pattern: Poor foot clearance - right Gait velocity: significantly decreased Stairs / Additional Locomotion Stairs: Yes Stairs Assistance: Moderate Assistance - Patient 50 - 74% Stair Management Technique: Two rails;Step to pattern Number of Stairs: 3 Height of Stairs: 3 Wheelchair Mobility Wheelchair Mobility: Yes Wheelchair Assistance: Independent with Garment/textile technologist: Both upper extremities Wheelchair Parts Management: Independent Distance: 150'  Trunk/Postural Assessment  Cervical Assessment Cervical Assessment: Within Functional Limits Thoracic Assessment Thoracic Assessment: Exceptions to WFL(Kyphotic) Lumbar Assessment Lumbar Assessment: Exceptions to WFL(Posterior pelvic tilt) Postural Control Postural Control: Deficits on evaluation(2/2 recent amputation)  Balance Balance Balance Assessed: Yes Static Sitting Balance Static Sitting - Balance Support: No upper extremity supported;Feet supported Static Sitting - Level of Assistance: 6: Modified independent (  Device/Increase time) Dynamic Sitting Balance Dynamic Sitting - Balance Support: During functional activity;Feet supported Dynamic Sitting - Level of Assistance: 6: Modified independent (Device/Increase time) Static Standing Balance Static Standing - Balance Support: Right upper extremity supported;Left upper extremity supported;During functional activity Static Standing - Level of Assistance: 5: Stand by assistance Dynamic Standing Balance Dynamic Standing - Balance Support: During functional activity;Right upper extremity supported;Left upper extremity supported Dynamic Standing - Level of Assistance: 5: Stand by assistance Extremity Assessment  RUE Assessment RUE Assessment: Within Functional Limits  LUE assessment: WFL RLE Assessment RLE Assessment: Within Functional Limits General Strength Comments: 4+/5 throughout LLE Assessment LLE Assessment: Within Functional Limits(BKA) Passive Range of Motion (PROM) Comments: WFL General Strength Comments: grossly 4/5 throughout hip/knee; ankle N/A d/t BKA  Skilled Therapeutic Intervention: Tx 1: Pt's son and daughter in law present for family education session; unable to stay for PT session later in the day as scheduled. Therapist performed abbreviated family education session to accommodate pt's family. Reviewed  recommendations for ascent/descent steps; son reports he is unable to bump patient due to back problems. Demo'ed other options for home entry, however son reporting he is contacting a friend who will be able to put up a temporary ramp for home entry tomorrow. Reviewed gait with walker, car transfer, w/c>bed all with son providing S. Discussed maintenance of shrinker, use of limb guard, goal of edema reduction and maintaining strength/ROM in LLE in preparation for prosthetic fitting/training. Returned to bed with S squat pivot at end of session, all needs in reach.   Tx 2: Pt received seated in bed, denies pain and agreeable to treatment. Supine <>sit modI. Transfer w/c <>bed, w/c <>mat table with S squat pivot. Reviewed LE strengthening/ROM exercises for UEs/LLE 3x15 reps each including chair dips, glute bridge on towel roll, straight leg raise. Discussed importance of positioning for reducing risk of hip/knee flexion contractures. Pt with no further questions or concerns regarding HEP. Returned to bed at end of session as above; remained in bed, all needs in reach.   See Function Navigator for Current Functional Status.  Benjiman Core Briany Aye 10/17/2017, 2:45 PM

## 2017-10-18 LAB — GLUCOSE, CAPILLARY
GLUCOSE-CAPILLARY: 126 mg/dL — AB (ref 70–99)
Glucose-Capillary: 132 mg/dL — ABNORMAL HIGH (ref 70–99)

## 2017-10-18 MED ORDER — OXYCODONE HCL 5 MG PO TABS: 5.0000 mg | ORAL_TABLET | Freq: Four times a day (QID) | ORAL | 0 refills | Status: DC | PRN

## 2017-10-18 MED ORDER — PANTOPRAZOLE SODIUM 40 MG PO TBEC: 40.0000 mg | DELAYED_RELEASE_TABLET | Freq: Every day | ORAL | 0 refills | Status: AC

## 2017-10-18 MED ORDER — NICOTINE 14 MG/24HR TD PT24: 14.0000 mg | MEDICATED_PATCH | Freq: Every day | TRANSDERMAL | 0 refills | Status: DC

## 2017-10-18 MED ORDER — MELATONIN 3 MG PO TABS: 4.5000 mg | ORAL_TABLET | Freq: Every evening | ORAL | 0 refills | Status: DC | PRN

## 2017-10-18 MED ORDER — GABAPENTIN 300 MG PO CAPS: 300.0000 mg | ORAL_CAPSULE | Freq: Three times a day (TID) | ORAL | 0 refills | Status: DC

## 2017-10-18 MED ORDER — ADULT MULTIVITAMIN W/MINERALS CH: 1.0000 | ORAL_TABLET | Freq: Every day | ORAL | 0 refills | Status: DC

## 2017-10-18 MED ORDER — METOPROLOL SUCCINATE ER 25 MG PO TB24: 25.0000 mg | ORAL_TABLET | Freq: Every day | ORAL | 0 refills | Status: AC

## 2017-10-18 MED ORDER — TRAMADOL HCL 50 MG PO TABS: 50.0000 mg | ORAL_TABLET | Freq: Four times a day (QID) | ORAL | 0 refills | Status: DC | PRN

## 2017-10-18 MED ORDER — AMLODIPINE BESYLATE 10 MG PO TABS: 10.0000 mg | ORAL_TABLET | Freq: Every day | ORAL | 0 refills | Status: AC

## 2017-10-18 MED ORDER — POLYETHYLENE GLYCOL 3350 17 G PO PACK: 17.0000 g | PACK | Freq: Every day | ORAL | 0 refills | Status: DC

## 2017-10-18 MED ORDER — INSULIN GLARGINE 100 UNIT/ML ~~LOC~~ SOLN: 28.0000 [IU] | Freq: Every day | SUBCUTANEOUS | 0 refills | Status: DC

## 2017-10-18 MED ORDER — METFORMIN HCL ER 500 MG PO TB24: 1000.0000 mg | ORAL_TABLET | Freq: Two times a day (BID) | ORAL | Status: DC

## 2017-10-18 NOTE — Discharge Instructions (Signed)
Inpatient Rehab Discharge Instructions  [name] Discharge date and time: 10/18/17   Activities/Precautions/ Functional Status: Activity: no lifting, driving, or strenuous exercise for till cleared by MD Diet: diabetic diet Wound Care: keep wound clean and dry Contact MD if you develop any problems with your incision/wound--redness, swelling, increase in pain, drainage or if you develop fever or chills.   Functional status:  ___ No restrictions     ___ Walk up steps independently _X__ 24/7 supervision/assistance   ___ Walk up steps with assistance ___ Intermittent supervision/assistance  ___ Bathe/dress independently ___ Walk with walker     _X__ Bathe/dress with assistance ___ Walk Independently    ___ Shower independently ___ Walk with assistance    ___ Shower with assistance _X__ No alcohol     ___ Return to work/school ________   Special Instructions: 1. Monitor blood sugars before meals and at bedtime.    COMMUNITY REFERRALS UPON DISCHARGE:    Home Health:   PT, OT, RN   Agency:[facility]    [place]   Date of last service:10/18/2017  Medical Equipment/Items Ordered:WHEELCHAIR, [place]   [phone]   GENERAL COMMUNITY RESOURCES FOR PATIENT/FAMILY: Support [place] SUPPORT GROUP THE SECOND Tuesday @ 7:00-8:30 PM AT [facility] [phone]   My questions have been answered and I understand these instructions. I will adhere to these goals and the provided educational materials after my discharge from the hospital.  Patient/Caregiver Signature _______________________________ Date __________  Clinician Signature _______________________________________ Date __________  Please bring this form and your medication list with you to all your follow-up doctor's appointments.

## 2017-10-18 NOTE — Plan of Care (Signed)
Patient d/c home with family.

## 2017-10-18 NOTE — Progress Notes (Signed)
[facility]IC[facility] RE[facility]ILITATION     PROGRESS NOTE  Subjective/Complaints:   No new issues overnite  ROS: Denies CP, SOB, N/V/D  Objective: Vital Signs: Blood pressure 113/72, pulse 69, temperature 97.8 F (36.6 C), temperature source Oral, resp. rate 18, height 5\' 7"  (1.702 m), weight 85.3 kg (188 lb 0.8 oz), SpO2 99 %. No results found. Recent Labs    10/15/17 0802  WBC 10.4  HGB 10.0*  HCT 31.6*  PLT 525*   Recent Labs    10/15/17 0802  NA 136  K 4.3  CL 102  GLUCOSE 170*  BUN 16  CREATININE 0.85  CALCIUM 9.2   CBG (last 3)  Recent Labs    10/17/17 1644 10/17/17 2152 10/18/17 0641  GLUCAP 119* 127* 132*    Wt Readings from Last 3 Encounters:  10/09/17 85.3 kg (188 lb 0.8 oz)  10/02/17 102.5 kg (226 lb)  05/28/17 100.2 kg (221 lb)    Physical Exam:  BP 113/72 (BP Location: Right Arm)   Pulse 69   Temp 97.8 F (36.6 C) (Oral)   Resp 18   Ht 5\' 7"  (1.702 m)   Wt 85.3 kg (188 lb 0.8 oz)   SpO2 99%   BMI 29.45 kg/m  Constitutional: No distress . Vital signs reviewed. HENT: Normocephalic.  Atraumatic. Eyes: EOMI. No discharge. Cardiovascular: RRR. No JVD. Respiratory: CTA Bilaterally. Normal effort. GI: BS +. Non-distended. Musculoskeletal:L-BKA TTP with mild edema,  Neurological: He isalertand oriented.  Motor: B/l UE 5/5.  RLE 4+/5 proximal to distal.  LLE 4/5 HF (pain inhibition) Skin:Left BKA incision with staples c/d/i, no active drainage or bleeding no scabs Well healed old scars on right knee and left forearm from prior injuries. Psychiatric:  Normal mood. Normal behavior.  Assessment/Plan: 1. Functional deficits secondary to left BKA  Stable for D/C today F/u PCP in 3-4 weeks F/u PM&R Dr Posey Pronto <2 weeks See D/C summary See D/C instructions Function:  Bathing Bathing position   Position: Shower  Bathing parts Body parts bathed by patient: Right arm, Left arm, Chest, Abdomen, Front perineal area, Right upper  leg, Buttocks, Left upper leg, Right lower leg, Back Body parts bathed by helper: Back  Bathing assist Assist Level: Supervision or verbal cues      Upper Body Dressing/Undressing Upper body dressing   What is the patient wearing?: Pull over shirt/dress     Pull over shirt/dress - Perfomed by patient: Thread/unthread right sleeve, Thread/unthread left sleeve, Put head through opening, Pull shirt over trunk Pull over shirt/dress - Perfomed by helper: Pull shirt over trunk        Upper body assist Assist Level: Set up   Set up : To obtain clothing/put away  Lower Body Dressing/Undressing Lower body dressing   What is the patient wearing?: Pants, Non-skid slipper socks     Pants- Performed by patient: Thread/unthread right pants leg, Thread/unthread left pants leg, Pull pants up/down Pants- Performed by helper: Pull pants up/down Non-skid slipper socks- Performed by patient: Don/doff left sock Non-skid slipper socks- Performed by helper: Don/doff left sock                  Lower body assist Assist for lower body dressing: Supervision or verbal cues      Toileting Toileting   Toileting steps completed by patient: Adjust clothing prior to toileting, Performs perineal hygiene, Adjust clothing after toileting Toileting steps completed by helper: Adjust clothing prior to toileting, Performs perineal hygiene, Adjust  clothing after toileting Toileting Assistive Devices: Grab bar or rail  Toileting assist Assist level: Supervision or verbal cues   Transfers Chair/bed transfer   Chair/bed transfer method: Squat pivot Chair/bed transfer assist level: Supervision or verbal cues Chair/bed transfer assistive device: Armrests     Locomotion Ambulation     Max distance: 25 Assist level: Supervision or verbal cues   Wheelchair   Type: Manual Max wheelchair distance: 150' Assist Level: No help, No cues, assistive device, takes more than reasonable amount of time   Cognition Comprehension Comprehension assist level: Follows basic conversation/direction with no assist  Expression Expression assist level: Expresses basic needs/ideas: With no assist  Social Interaction Social Interaction assist level: Interacts appropriately with others with medication or extra time (anti-anxiety, antidepressant).  Problem Solving Problem solving assist level: Solves basic 90% of the time/requires cueing < 10% of the time  Memory Memory assist level: Recognizes or recalls 75 - 89% of the time/requires cueing 10 - 24% of the time    Medical Problem List and Plan: 1.Functional deficitssecondary to left BKA  Continue CIR   Planned for discharge today. 2. DVT Prophylaxis/Anticoagulation: Pharmaceutical:Lovenox 3. Pain Management:Continue oxycodone prn with Neurontin tid for neuropathy/phantom pain.No C/os 4. Mood:LCSW to follow for evaluation and support. 5. Neuropsych: This patientiscapable of making decisions on hisown behalf. 6. Skin/Wound Care:dry dressing/shrinker sock daily 7. Fluids/Electrolytes/Nutrition: Monitor I/Os.  BMP within acceptable range, except for glucose on 7/10 8. T2DM: Hgb A1c- 11.4-- poorly controlled. Was on Lantus 75 units at home with metformin 2000 mg daily PTA.   -improving control but still suboptimal  Lantus 15 units, increased to 25 on 7/11, increase to 28u on 7/14  Resumed metformin at 1000 mg bid  CBG (last 3)  Recent Labs    10/17/17 1644 10/17/17 2152 10/18/17 0641  GLUCAP 119* 127* 132*  controlled 9. ABLA on Anemia of chronic illness?:   Hb 10.0 on 7/15  Cont to monitor 10. HTN: Monitor BP bid. Continue Norvasc.  Vitals:   10/17/17 2000 10/18/17 0422  BP: 123/75 113/72  Pulse: 61 69  Resp: 17 18  Temp: 98.6 F (37 C) 97.8 F (36.6 C)  SpO2: 100% 99%    Controlled on 7/18 11. Thrombocytosis: Likely reactive   Continue to monitor for recovery.   Plts 525 on 7/15  Cont to monitor 12. Constipation:  denies problem LOS (Days) 9 A FACE TO FACE EVALUATION WAS PERFORMED  Charlett Blake 10/18/2017 7:38 AM

## 2017-10-18 NOTE — Progress Notes (Signed)
Patient ID: [name], male   DOB: 17-Nov-1960, 57 y.o.   MRN: [id number]   Patient being d/c home with family. Belongings were took home yesterday by family. No c/o pain at this time. Patient took shower today with minimal supervision. Discharge instructions given and family verbalize understanding.

## 2017-10-22 ENCOUNTER — Telehealth: Payer: Self-pay | Admitting: Registered Nurse

## 2017-10-22 NOTE — Telephone Encounter (Signed)
Transitional Care call  Transitional Care Call Questions Answered by Daughter in law Melissa  Patient name: [name] DOB: 1960-06-17 1. Are you/is patient experiencing any problems since coming home? No a. Are there any questions regarding any aspect of care? No 2. Are there any questions regarding medications administration/dosing? No a. Are meds being taken as prescribed? Yes b. "Patient should review meds with caller to confirm"  Medication List Reviewed 3. Have there been any falls? No 4. Has Home Health been to the house and/or have they contacted you? Yes, [facility] a. If not, have you tried to contact them? NA b. Can we help you contact them? NA 5. Are bowels and bladder emptying properly? Yes a. Are there any unexpected incontinence issues? No b. If applicable, is patient following bowel/bladder programs? NA 6. Any fevers, problems with breathing, unexpected pain? No 7. Are there any skin problems or new areas of breakdown? No 8. Has the patient/family member arranged specialty MD follow up (ie cardiology/neurology/renal/surgical/etc.)?  Yes, she is in the process of scheduling an appointment with [name] she states.  a. Can we help arrange? NA 9. Does the patient need any other services or support that we can help arrange? No 10. Are caregivers following through as expected in assisting the patient? Yes 11. Has the patient quit smoking, drinking alcohol, or using drugs as recommended? Melissa states [name] is not smoking, drinking alcoholor using illicit  drugs.   Appointment date/time 10/30/2017, with Danella Sensing ANP. At [facility]

## 2017-10-24 ENCOUNTER — Telehealth: Payer: Self-pay

## 2017-10-24 NOTE — Telephone Encounter (Signed)
Sheryl, RN/[facility] called asking if Dr. Posey Pronto will be signing off on patients orders. Per discharge summary yes Dr. Posey Pronto will sign off. Sheryl notified.

## 2017-10-24 NOTE — Telephone Encounter (Signed)
Ben,PT/[facility] called requesting PT for 1wk1, 2wk2,1wk2 for patient. Verbal orders given per discharge summary.

## 2017-10-25 ENCOUNTER — Encounter: Payer: Self-pay | Admitting: "Endocrinology

## 2017-10-30 ENCOUNTER — Encounter: Payer: Self-pay | Admitting: Registered Nurse

## 2017-10-30 ENCOUNTER — Encounter (INDEPENDENT_AMBULATORY_CARE_PROVIDER_SITE_OTHER): Payer: Self-pay | Admitting: Orthopedic Surgery

## 2017-10-30 ENCOUNTER — Encounter: Payer: Medicaid Other | Attending: Registered Nurse | Admitting: Registered Nurse

## 2017-10-30 ENCOUNTER — Ambulatory Visit (INDEPENDENT_AMBULATORY_CARE_PROVIDER_SITE_OTHER): Payer: Medicaid Other | Admitting: Orthopedic Surgery

## 2017-10-30 VITALS — BP 135/80 | HR 69 | Ht 67.0 in | Wt 226.0 lb

## 2017-10-30 VITALS — Ht 67.0 in | Wt 226.0 lb

## 2017-10-30 DIAGNOSIS — E785 Hyperlipidemia, unspecified: Secondary | ICD-10-CM | POA: Insufficient documentation

## 2017-10-30 DIAGNOSIS — E119 Type 2 diabetes mellitus without complications: Secondary | ICD-10-CM

## 2017-10-30 DIAGNOSIS — M109 Gout, unspecified: Secondary | ICD-10-CM | POA: Insufficient documentation

## 2017-10-30 DIAGNOSIS — Z89512 Acquired absence of left leg below knee: Secondary | ICD-10-CM | POA: Diagnosis not present

## 2017-10-30 DIAGNOSIS — G4700 Insomnia, unspecified: Secondary | ICD-10-CM | POA: Diagnosis not present

## 2017-10-30 DIAGNOSIS — I10 Essential (primary) hypertension: Secondary | ICD-10-CM

## 2017-10-30 DIAGNOSIS — X58XXXA Exposure to other specified factors, initial encounter: Secondary | ICD-10-CM | POA: Insufficient documentation

## 2017-10-30 DIAGNOSIS — Z794 Long term (current) use of insulin: Secondary | ICD-10-CM | POA: Diagnosis not present

## 2017-10-30 DIAGNOSIS — S88112A Complete traumatic amputation at level between knee and ankle, left lower leg, initial encounter: Secondary | ICD-10-CM | POA: Diagnosis present

## 2017-10-30 DIAGNOSIS — M25561 Pain in right knee: Secondary | ICD-10-CM | POA: Insufficient documentation

## 2017-10-30 DIAGNOSIS — Z87891 Personal history of nicotine dependence: Secondary | ICD-10-CM | POA: Insufficient documentation

## 2017-10-30 DIAGNOSIS — IMO0002 Reserved for concepts with insufficient information to code with codable children: Secondary | ICD-10-CM

## 2017-10-30 MED ORDER — OXYCODONE-ACETAMINOPHEN 5-325 MG PO TABS: 1.0000 | ORAL_TABLET | Freq: Three times a day (TID) | ORAL | 0 refills | Status: DC | PRN

## 2017-10-30 NOTE — Progress Notes (Signed)
Office Visit Note   Patient: [name]           Date of Birth: [birth year]           MRN: [id number] Visit Date: 10/30/2017              Requested by: Vidal Schwalbe, MD [address] [place], [place] [address] PCP: Vidal Schwalbe, MD  Chief Complaint  Patient presents with  . Left Leg - Routine Post Op    10/05/17 left BKA      HPI: Patient is a 57 year old gentleman who presents 4 weeks status post left transtibial amputation he is working with [employer] he has a limb protector and 4 extra-large stump shrinker  Assessment & Plan: Visit Diagnoses:  1. Below knee amputation status, left ([place])     Plan: Patient will continue with the stump shrinker the staples were harvested continue with the limb protector continue with knee extension exercises.  Follow-Up Instructions: Return in about 1 month (around 11/27/2017).   Ortho Exam  Patient is alert, oriented, no adenopathy, well-dressed, normal affect, normal respiratory effort. Examination the incision is well-healed there is no redness no drainage no cellulitis.  The staples were harvested.  Patient was placed back in his stump shrinker.  He has full extension.  Imaging: No results found. No images are attached to the encounter.  Labs: Lab Results  Component Value Date   HGBA1C 11.4 (H) 10/02/2017   ESRSEDRATE 67 (H) 08/05/2011   LABURIC 7.5 04/05/2016   LABURIC 7.7 (H) 04/04/2016   REPTSTATUS 10/07/2017 FINAL 10/02/2017   CULT  10/02/2017    NO GROWTH 5 DAYS Performed at [facility], [place] [address], [place], [place] [address]      Lab Results  Component Value Date   ALBUMIN 2.5 (L) 10/10/2017   ALBUMIN 2.3 (L) 10/08/2017   ALBUMIN 2.3 (L) 10/07/2017   LABURIC 7.5 04/05/2016   LABURIC 7.7 (H) 04/04/2016    Body mass index is 35.4 kg/m.  Orders:  No orders of the defined types were placed in this encounter.  Meds ordered this encounter  Medications  . oxyCODONE-acetaminophen  (PERCOCET/ROXICET) 5-325 MG tablet    Sig: Take 1 tablet by mouth every 8 (eight) hours as needed.    Dispense:  20 tablet    Refill:  0     Procedures: No procedures performed  Clinical Data: No additional findings.  ROS:  All other systems negative, except as noted in the HPI. Review of Systems  Objective: Vital Signs: Ht 5\' 7"  (1.702 m)   Wt 226 lb (102.5 kg)   BMI 35.40 kg/m   Specialty Comments:  No specialty comments available.  PMFS History: Patient Active Problem List   Diagnosis Date Noted  . Unilateral complete BKA, right, sequela ([place])   . Thrombocytosis ([place])   . Constipation due to pain medication   . Below knee amputation status, left ([place])   . Unilateral complete BKA, left, initial encounter ([facility])   . Acute blood loss anemia   . Post-operative pain   . Benign essential HTN   . Tobacco abuse   . S/P amputation   . Subacute osteomyelitis, left ankle and foot ([facility])   . Acute osteomyelitis of left calcaneus ([facility])   . Cutaneous abscess of left foot   . Severe protein-calorie malnutrition ([facility])   . PAD (peripheral artery disease) ([facility])   . Wound infection 10/02/2017  . Type 2 diabetes mellitus treated with insulin (  [place])   . Tobacco use disorder   . Lymphedema 05/28/2017  . Essential hypertension 05/28/2017  . Poorly controlled type 2 diabetes mellitus ([facility]) 05/28/2017  . DJD (degenerative joint disease) 05/28/2017  . Hyponatremia 04/05/2016  . Anemia 04/05/2016  . Gout 04/05/2016  . Bilateral foot pain 04/04/2016   Past Medical History:  Diagnosis Date  . Diabetes mellitus without complication ([facility])   . Gout   . Hyperlipidemia   . Hypertension   . Insomnia     Family History  Problem Relation Age of Onset  . Diabetes Mother     Past Surgical History:  Procedure Laterality Date  . AMPUTATION Left 10/05/2017   Procedure: LEFT BELOW KNEE AMPUTATION;  Surgeon: [name], MD;  Location: [facility];  Service: Orthopedics;  Laterality: Left;  .  JOINT REPLACEMENT     right knee  . LEG SURGERY     Social History   Occupational History  . Not on file  Tobacco Use  . Smoking status: Former Smoker    Packs/day: 1.00    Years: 50.00    Pack years: 50.00    Types: Cigarettes  . Smokeless tobacco: Never Used  Substance and Sexual Activity  . Alcohol use: Not Currently  . Drug use: No  . Sexual activity: Not on file

## 2017-10-30 NOTE — Progress Notes (Signed)
Subjective:    Patient ID: [name], male    DOB: [birth year], 57 y.o.   MRN: [id number]  HPI: Mr. [name] is a 57 year old male who is here for transitional care visit in follow up of his Left BKA, Type 2 DM with insulin and hypertension. He has been home with home health therapies with [facility]. He states he has pain in his left stump and right knee. He rates his pain 6.   Mr. [name] has a follow up appointment with [name] today, he asked about re-fill of his Oxycodone. Since he has a follow up appointment with [name] today he was instructed to speak with [name] he and family verbalizes understanding.   Son and daughter-in law in room, all questions answered.   Pain Inventory Average Pain 8 Pain Right Now 6 My pain is aching  In the last 24 hours, has pain interfered with the following? General activity 5 Relation with others 5 Enjoyment of life 5 What TIME of day is your pain at its worst? all Sleep (in general) Fair  Pain is worse with: na Pain improves with: medication Relief from Meds: 4  Mobility do you drive?  no use a wheelchair needs help with transfers  Function disabled: date disabled .  Neuro/Psych No problems in this area  Prior Studies Any changes since last visit?  no  Physicians involved in your care Any changes since last visit?  no   Family History  Problem Relation Age of Onset  . Diabetes Mother    Social History   Socioeconomic History  . Marital status: Single    Spouse name: Not on file  . Number of children: Not on file  . Years of education: Not on file  . Highest education level: Not on file  Occupational History  . Not on file  Social Needs  . Financial resource strain: Not on file  . Food insecurity:    Worry: Not on file    Inability: Not on file  . Transportation needs:    Medical: Not on file    Non-medical: Not on file  Tobacco Use  . Smoking status: Former Smoker   Packs/day: 1.00    Years: 50.00    Pack years: 50.00    Types: Cigarettes  . Smokeless tobacco: Never Used  Substance and Sexual Activity  . Alcohol use: Not Currently  . Drug use: No  . Sexual activity: Not on file  Lifestyle  . Physical activity:    Days per week: Not on file    Minutes per session: Not on file  . Stress: Not on file  Relationships  . Social connections:    Talks on phone: Not on file    Gets together: Not on file    Attends religious service: Not on file    Active member of club or organization: Not on file    Attends meetings of clubs or organizations: Not on file    Relationship status: Not on file  Other Topics Concern  . Not on file  Social History Narrative  . Not on file   Past Surgical History:  Procedure Laterality Date  . AMPUTATION Left 10/05/2017   Procedure: LEFT BELOW KNEE AMPUTATION;  Surgeon: [name], MD;  Location: [facility];  Service: Orthopedics;  Laterality: Left;  . JOINT REPLACEMENT     right knee  . LEG SURGERY     Past Medical History:  Diagnosis Date  .  Diabetes mellitus without complication ([place] [place])   . Gout   . Hyperlipidemia   . Hypertension   . Insomnia    Ht 5\' 7"  (1.702 m)   Wt 226 lb (102.5 kg)   BMI 35.40 kg/m   Opioid Risk Score:   Fall Risk Score:  `1  Depression screen PHQ 2/9  Depression screen PHQ 2/9 03/29/2017  Decreased Interest 0  Down, Depressed, Hopeless 0  PHQ - 2 Score 0     Review of Systems  Constitutional: Negative.   HENT: Negative.   Eyes: Negative.   Respiratory: Negative.   Cardiovascular: Negative.   Gastrointestinal: Negative.   Endocrine: Negative.   Genitourinary: Negative.   Musculoskeletal: Positive for arthralgias, gait problem and myalgias.  Skin: Negative.   Allergic/Immunologic: Negative.   Hematological: Negative.   Psychiatric/Behavioral: Negative.   All other systems reviewed and are negative.      Objective:   Physical Exam  Constitutional: He is oriented  to person, place, and time. He appears well-developed and well-nourished.  HENT:  Head: Normocephalic and atraumatic.  Neck: Normal range of motion. Neck supple.  Cardiovascular: Normal rate and regular rhythm.  Pulmonary/Chest: Effort normal and breath sounds normal.  Musculoskeletal:  Normal Muscle Bulk and Muscle Testing Reveals:  Upper Extremities: Full ROM and Muscle Strength 5/5 Lower Extremities: Right Full ROM and Muscle Strength  Left Stump with Staples OTA, no drainage/ stump protector in place. Arrived in wheelchair    Neurological: He is alert and oriented to person, place, and time.  Skin: Skin is warm and dry.  Psychiatric: He has a normal mood and affect. His behavior is normal.  Nursing note and vitals reviewed.         Assessment & Plan:  1. S/P Left BKA: [name] Following.  2. HTN: Continue current medication regime. PCP Following. 3. Type 2 DM with Insulin: Endocrinologist Following. Educated on Tight Glucose Control and adhering to Diabetic Diet. He verbalizes understanding.   30 minutes minutes of face to face patient care time was spent during this visit. All questions were encouraged and answered.  F/U in 4- 6 weeks with Dr. Posey Pronto

## 2017-10-31 ENCOUNTER — Telehealth: Payer: Self-pay | Admitting: Physical Medicine & Rehabilitation

## 2017-10-31 NOTE — Telephone Encounter (Signed)
Call was for [facility], patient did get pain medication from [name].  They wanted to let her know.  Any questions, please call patient.

## 2017-11-01 ENCOUNTER — Telehealth: Payer: Self-pay | Admitting: *Deleted

## 2017-11-01 NOTE — Telephone Encounter (Signed)
Caryl Pina, RN, [name] left a message asking for verbal orders to switch [name] skilled nursing visit this week to next week 1week1. Medical record reviewed, verbal orders given per office protocol

## 2017-11-07 DIAGNOSIS — Z4781 Encounter for orthopedic aftercare following surgical amputation: Secondary | ICD-10-CM

## 2017-11-07 DIAGNOSIS — G4700 Insomnia, unspecified: Secondary | ICD-10-CM

## 2017-11-07 DIAGNOSIS — M1990 Unspecified osteoarthritis, unspecified site: Secondary | ICD-10-CM

## 2017-11-07 DIAGNOSIS — D649 Anemia, unspecified: Secondary | ICD-10-CM | POA: Diagnosis not present

## 2017-11-07 DIAGNOSIS — E1151 Type 2 diabetes mellitus with diabetic peripheral angiopathy without gangrene: Secondary | ICD-10-CM | POA: Diagnosis not present

## 2017-11-07 DIAGNOSIS — Z79891 Long term (current) use of opiate analgesic: Secondary | ICD-10-CM

## 2017-11-07 DIAGNOSIS — E1140 Type 2 diabetes mellitus with diabetic neuropathy, unspecified: Secondary | ICD-10-CM

## 2017-11-07 DIAGNOSIS — Z9181 History of falling: Secondary | ICD-10-CM

## 2017-11-07 DIAGNOSIS — Z89512 Acquired absence of left leg below knee: Secondary | ICD-10-CM

## 2017-11-07 DIAGNOSIS — Z7982 Long term (current) use of aspirin: Secondary | ICD-10-CM

## 2017-11-07 DIAGNOSIS — E440 Moderate protein-calorie malnutrition: Secondary | ICD-10-CM

## 2017-11-07 DIAGNOSIS — Z794 Long term (current) use of insulin: Secondary | ICD-10-CM

## 2017-11-07 DIAGNOSIS — E785 Hyperlipidemia, unspecified: Secondary | ICD-10-CM

## 2017-11-07 DIAGNOSIS — M109 Gout, unspecified: Secondary | ICD-10-CM

## 2017-11-07 DIAGNOSIS — I10 Essential (primary) hypertension: Secondary | ICD-10-CM | POA: Diagnosis not present

## 2017-11-28 ENCOUNTER — Ambulatory Visit: Payer: Medicaid Other | Admitting: Physical Medicine & Rehabilitation

## 2017-11-29 ENCOUNTER — Ambulatory Visit (INDEPENDENT_AMBULATORY_CARE_PROVIDER_SITE_OTHER): Payer: Medicaid Other | Admitting: Physician Assistant

## 2017-11-29 ENCOUNTER — Encounter (INDEPENDENT_AMBULATORY_CARE_PROVIDER_SITE_OTHER): Payer: Self-pay | Admitting: Orthopedic Surgery

## 2017-11-29 ENCOUNTER — Encounter: Payer: Medicaid Other | Attending: Registered Nurse | Admitting: Physical Medicine & Rehabilitation

## 2017-11-29 ENCOUNTER — Encounter: Payer: Self-pay | Admitting: Physical Medicine & Rehabilitation

## 2017-11-29 VITALS — BP 128/86 | HR 63 | Ht 67.0 in | Wt 226.0 lb

## 2017-11-29 VITALS — Ht 67.0 in | Wt 226.0 lb

## 2017-11-29 DIAGNOSIS — E119 Type 2 diabetes mellitus without complications: Secondary | ICD-10-CM | POA: Diagnosis not present

## 2017-11-29 DIAGNOSIS — G4700 Insomnia, unspecified: Secondary | ICD-10-CM | POA: Diagnosis not present

## 2017-11-29 DIAGNOSIS — I10 Essential (primary) hypertension: Secondary | ICD-10-CM | POA: Diagnosis not present

## 2017-11-29 DIAGNOSIS — M25561 Pain in right knee: Secondary | ICD-10-CM | POA: Insufficient documentation

## 2017-11-29 DIAGNOSIS — E785 Hyperlipidemia, unspecified: Secondary | ICD-10-CM | POA: Diagnosis not present

## 2017-11-29 DIAGNOSIS — M109 Gout, unspecified: Secondary | ICD-10-CM | POA: Diagnosis not present

## 2017-11-29 DIAGNOSIS — R269 Unspecified abnormalities of gait and mobility: Secondary | ICD-10-CM

## 2017-11-29 DIAGNOSIS — Z89512 Acquired absence of left leg below knee: Secondary | ICD-10-CM

## 2017-11-29 DIAGNOSIS — S88112A Complete traumatic amputation at level between knee and ankle, left lower leg, initial encounter: Secondary | ICD-10-CM | POA: Diagnosis not present

## 2017-11-29 DIAGNOSIS — Z794 Long term (current) use of insulin: Secondary | ICD-10-CM | POA: Diagnosis not present

## 2017-11-29 DIAGNOSIS — Z87891 Personal history of nicotine dependence: Secondary | ICD-10-CM | POA: Diagnosis not present

## 2017-11-29 MED ORDER — OXYCODONE-ACETAMINOPHEN 5-325 MG PO TABS: 1.0000 | ORAL_TABLET | Freq: Three times a day (TID) | ORAL | 0 refills | Status: DC | PRN

## 2017-11-29 MED ORDER — GABAPENTIN 300 MG PO CAPS: 300.0000 mg | ORAL_CAPSULE | Freq: Three times a day (TID) | ORAL | 1 refills | Status: DC
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
## 2017-11-30 ENCOUNTER — Encounter (INDEPENDENT_AMBULATORY_CARE_PROVIDER_SITE_OTHER): Payer: Self-pay | Admitting: Physician Assistant

## 2017-11-30 NOTE — Progress Notes (Signed)
Office Visit Note   Patient: [name]           Date of Birth: 11/28/1960           MRN: [id number] Visit Date: 11/29/2017              Requested by: Vidal Schwalbe, MD [address] [place], [place] [address] PCP: Vidal Schwalbe, MD   Assessment & Plan: Visit Diagnoses:  1. 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  It is felt that the patient will be a K3 community ambulator with the ability or potential for ambulation with variable cadence, traverse most environmental barriers and may have vocational therapeutic or exercise activity that demands prosthetic utilization beyond simple locomotion including climbing stairs, bleachers, in all types of uneven terrain.  Percocet was refilled this visit.  Follow-Up Instructions: Return in about 4 weeks (around 12/27/2017).   Orders:  No orders of the defined types were placed in this encounter.  Meds ordered this encounter  Medications  . oxyCODONE-acetaminophen (PERCOCET/ROXICET) 5-325 MG tablet    Sig: Take 1 tablet by mouth every 8 (eight) hours as needed for moderate pain or severe pain.    Dispense:  20 tablet    Refill:  0      Procedures: No procedures performed   Clinical Data: No additional findings.   Subjective: Chief Complaint  Patient presents with  . Left Leg - Follow-up    HPI Is a 57 year old male who is seen for postoperative follow-up following left transtibial amputation on 10/05/2017.  He reports that he has been doing well.  He has had no wound healing issues.  He is intermittently having some pain but it is not severe.  He would like to proceed to getting a prosthesis. Review of Systems   Objective: Vital Signs: Ht 5\' 7"  (1.702 m)   Wt 226 lb (102.5 kg)   BMI 35.40 kg/m   Physical Exam Patient appears well-nourished well-developed and in no distress.  He is alert and oriented and  appropriate and well-groomed. Ortho Exam On examination of his left transtibial amputation it is well-healed.  There is minimal to no edema.  He is utilizing a stump shrinker.  There is no signs of cellulitis. Specialty Comments:  No specialty comments available.  Imaging: No results found.   PMFS History: Patient Active Problem List   Diagnosis Date Noted  . S/P BKA (below knee amputation) unilateral, left ([place]) 11/29/2017  . Unilateral complete BKA, right, sequela ([place])   . Thrombocytosis ([place])   . Constipation due to pain medication   . Below knee amputation status, left ([place])   . Unilateral complete BKA, left, initial encounter ([place])   . Acute blood loss anemia   . Post-operative pain   . Benign essential HTN   . Tobacco abuse   . S/P amputation   . Subacute osteomyelitis, left ankle and foot ([place])   . Acute osteomyelitis of left calcaneus ([facility])   . Cutaneous abscess of left foot   . Severe protein-calorie malnutrition ([place])   . PAD (peripheral artery disease) ([place])   . Wound infection 10/02/2017  . Type 2 diabetes mellitus treated with insulin ([place])   . Tobacco use disorder   . Lymphedema 05/28/2017  . Essential hypertension 05/28/2017  . Poorly controlled type 2 diabetes mellitus ([place]) 05/28/2017  . DJD (degenerative joint disease) 05/28/2017  .  Hyponatremia 04/05/2016  . Anemia 04/05/2016  . Gout 04/05/2016  . Bilateral foot pain 04/04/2016   Past Medical History:  Diagnosis Date  . Diabetes mellitus without complication ([place])   . Gout   . Hyperlipidemia   . Hypertension   . Insomnia     Family History  Problem Relation Age of Onset  . Diabetes Mother     Past Surgical History:  Procedure Laterality Date  . AMPUTATION Left 10/05/2017   Procedure: LEFT BELOW KNEE AMPUTATION;  Surgeon: [name], MD;  Location: [facility];  Service: Orthopedics;  Laterality: Left;  . JOINT REPLACEMENT     right knee  . LEG SURGERY     Social History   Occupational  History  . Not on file  Tobacco Use  . Smoking status: Former Smoker    Packs/day: 1.00    Years: 50.00    Pack years: 50.00    Types: Cigarettes  . Smokeless tobacco: Never Used  Substance and Sexual Activity  . Alcohol use: Not Currently  . Drug use: No  . Sexual activity: Not on file

## 2017-12-12 ENCOUNTER — Encounter: Payer: Self-pay | Admitting: "Endocrinology

## 2017-12-12 ENCOUNTER — Ambulatory Visit (INDEPENDENT_AMBULATORY_CARE_PROVIDER_SITE_OTHER): Payer: Medicaid Other | Admitting: "Endocrinology

## 2017-12-12 VITALS — BP 126/79 | HR 62 | Ht 67.0 in | Wt 226.0 lb

## 2017-12-12 DIAGNOSIS — I10 Essential (primary) hypertension: Secondary | ICD-10-CM | POA: Diagnosis not present

## 2017-12-12 DIAGNOSIS — E1159 Type 2 diabetes mellitus with other circulatory complications: Secondary | ICD-10-CM | POA: Diagnosis not present

## 2017-12-12 DIAGNOSIS — E782 Mixed hyperlipidemia: Secondary | ICD-10-CM | POA: Diagnosis not present

## 2017-12-12 MED ORDER — INSULIN GLARGINE 100 UNIT/ML ~~LOC~~ SOLN: 30.0000 [IU] | Freq: Every day | SUBCUTANEOUS | 0 refills | Status: DC

## 2017-12-12 MED ORDER — METFORMIN HCL ER 500 MG PO TB24: 500.0000 mg | ORAL_TABLET | Freq: Two times a day (BID) | ORAL | Status: DC

## 2017-12-12 NOTE — Progress Notes (Signed)
Endocrinology Consult Note       12/12/2017, 1:24 PM   Subjective:    Patient ID: [name], male    DOB: 1961/01/23.  [name] is being seen in consultation for management of currently uncontrolled complicated symptomatic  Type 2 diabetes requested by  Vidal Schwalbe, MD.   Past Medical History:  Diagnosis Date  . Diabetes mellitus without complication ([place])   . Gout   . Hyperlipidemia   . Hypertension   . Insomnia    Past Surgical History:  Procedure Laterality Date  . AMPUTATION Left 10/05/2017   Procedure: LEFT BELOW KNEE AMPUTATION;  Surgeon: [name], MD;  Location: [facility];  Service: Orthopedics;  Laterality: Left;  . JOINT REPLACEMENT     right knee  . LEG SURGERY     Social History   Socioeconomic History  . Marital status: Single    Spouse name: Not on file  . Number of children: Not on file  . Years of education: Not on file  . Highest education level: Not on file  Occupational History  . Not on file  Social Needs  . Financial resource strain: Not on file  . Food insecurity:    Worry: Not on file    Inability: Not on file  . Transportation needs:    Medical: Not on file    Non-medical: Not on file  Tobacco Use  . Smoking status: Former Smoker    Packs/day: 1.00    Years: 50.00    Pack years: 50.00    Types: Cigarettes  . Smokeless tobacco: Never Used  Substance and Sexual Activity  . Alcohol use: Not Currently  . Drug use: No  . Sexual activity: Not on file  Lifestyle  . Physical activity:    Days per week: Not on file    Minutes per session: Not on file  . Stress: Not on file  Relationships  . Social connections:    Talks on phone: Not on file    Gets together: Not on file    Attends religious service: Not on file    Active member of club or organization: Not on file    Attends meetings of clubs or organizations: Not on file     Relationship status: Not on file  Other Topics Concern  . Not on file  Social History Narrative  . Not on file   Outpatient Encounter Medications as of 12/12/2017  Medication Sig  . amitriptyline (ELAVIL) 25 MG tablet Take 25 mg by mouth at bedtime.  Marland Kitchen amLODipine (NORVASC) 10 MG tablet Take 1 tablet (10 mg total) by mouth daily.  Marland Kitchen aspirin 81 MG chewable tablet Chew 81 mg by mouth daily.  Marland Kitchen gabapentin (NEURONTIN) 300 MG capsule Take 1 capsule (300 mg total) by mouth 3 (three) times daily.  . insulin glargine (LANTUS) 100 UNIT/ML injection Inject 0.3 mLs (30 Units total) into the skin daily.  . metFORMIN (GLUCOPHAGE-XR) 500 MG 24 hr tablet Take 1 tablet (500 mg total) by mouth 2 (two) times daily after a meal. Take two pills with breakfast and two pills with supper  . metoprolol  succinate (TOPROL-XL) 25 MG 24 hr tablet Take 1 tablet (25 mg total) by mouth daily.  . Multiple Vitamin (MULTIVITAMIN WITH MINERALS) TABS tablet Take 1 tablet by mouth daily.  Marland Kitchen oxyCODONE (OXY IR/ROXICODONE) 5 MG immediate release tablet Take 1 tablet (5 mg total) by mouth every 6 (six) hours as needed for severe pain.  . pantoprazole (PROTONIX) 40 MG tablet Take 1 tablet (40 mg total) by mouth daily.  . polyethylene glycol (MIRALAX / GLYCOLAX) packet Take 17 g by mouth daily.  . rosuvastatin (CRESTOR) 5 MG tablet Take 5 mg by mouth daily.  . [DISCONTINUED] insulin glargine (LANTUS) 100 UNIT/ML injection Inject 0.28 mLs (28 Units total) into the skin daily.  . [DISCONTINUED] metFORMIN (GLUCOPHAGE-XR) 500 MG 24 hr tablet Take 2 tablets (1,000 mg total) by mouth 2 (two) times daily. Take two pills with breakfast and two pills with supper  . acetaminophen (TYLENOL) 325 MG tablet Take 2 tablets (650 mg total) by mouth every 6 (six) hours.  . Melatonin 3 MG TABS Take 1.5 tablets (4.5 mg total) by mouth at bedtime as needed (sleep).  . [DISCONTINUED] oxyCODONE-acetaminophen (PERCOCET/ROXICET) 5-325 MG tablet Take 1 tablet by  mouth every 8 (eight) hours as needed for moderate pain or severe pain.   No facility-administered encounter medications on file as of 12/12/2017.     ALLERGIES: No Known Allergies  VACCINATION STATUS:  There is no immunization history on file for this patient.  Diabetes  He presents for his initial diabetic visit. He has type 2 diabetes mellitus. Onset time: He was diagnosed at approximate age of 55 years. His disease course has been worsening. There are no hypoglycemic associated symptoms. Pertinent negatives for hypoglycemia include no confusion, headaches, pallor or seizures. Associated symptoms include blurred vision, polydipsia and polyuria. Pertinent negatives for diabetes include no chest pain, no fatigue, no polyphagia and no weakness. There are no hypoglycemic complications. Symptoms are worsening. Diabetic complications include nephropathy and PVD. (Patient is status post left below-knee amputation as a complication of diabetes.) Risk factors for coronary artery disease include diabetes mellitus, dyslipidemia, family history, male sex, obesity, hypertension, sedentary lifestyle and tobacco exposure. Current diabetic treatment includes insulin injections and oral agent (monotherapy) (Is currently on Lantus 28 units daily in the morning and metformin 2000 g daily.). His weight is fluctuating minimally. He is following a generally unhealthy diet. When asked about meal planning, he reported none. He has not had a previous visit with a dietitian. 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 (Did not bring any meter nor logs to review.  His recent A1c was 11.4%.) An ACE inhibitor/angiotensin II receptor blocker is not being taken. He does not see a podiatrist.Eye exam is current.  Hyperlipidemia  This is a chronic problem. The current episode started more than 1 year ago. Exacerbating diseases include diabetes and obesity. Pertinent negatives include no  chest pain, myalgias or shortness of breath. Current antihyperlipidemic treatment includes statins. Risk factors for coronary artery disease include dyslipidemia, diabetes mellitus, hypertension, male sex, obesity, a sedentary lifestyle and family history.  Hypertension  This is a chronic problem. The current episode started more than 1 year ago. Associated symptoms include blurred vision. Pertinent negatives include no chest pain, headaches, neck pain, palpitations or shortness of breath. Risk factors for coronary artery disease include dyslipidemia, family history, diabetes mellitus, obesity, male gender, sedentary lifestyle and smoking/tobacco exposure. Past treatments include calcium channel blockers and beta blockers. Hypertensive end-organ damage includes  PVD.      Review of Systems  Constitutional: Negative for chills, fatigue, fever and unexpected weight change.  HENT: Negative for dental problem, mouth sores and trouble swallowing.   Eyes: Positive for blurred vision. Negative for visual disturbance.  Respiratory: Negative for cough, choking, chest tightness, shortness of breath and wheezing.   Cardiovascular: Negative for chest pain, palpitations and leg swelling.  Gastrointestinal: Negative for abdominal distention, abdominal pain, constipation, diarrhea, nausea and vomiting.  Endocrine: Positive for polydipsia and polyuria. Negative for polyphagia.  Genitourinary: Negative for dysuria, flank pain, hematuria and urgency.  Musculoskeletal: Positive for gait problem. Negative for back pain, myalgias and neck pain.       Patient is on a wheelchair due to recent left BKA.  Skin: Negative for pallor, rash and wound.  Neurological: Negative for seizures, syncope, weakness, numbness and headaches.  Psychiatric/Behavioral: Negative.  Negative for confusion and dysphoric mood.    Objective:    BP 126/79   Pulse 62   Ht 5\' 7"  (1.702 m)   Wt 226 lb (102.5 kg) Comment: Unable to weigh  today. Wheelchair bound. BKA.  BMI 35.40 kg/m   Wt Readings from Last 3 Encounters:  12/12/17 226 lb (102.5 kg)  11/29/17 226 lb (102.5 kg)  11/29/17 226 lb (102.5 kg)     Physical Exam  Constitutional: He is oriented to person, place, and time. He appears well-developed and well-nourished. He is cooperative. No distress.  HENT:  Head: Normocephalic and atraumatic.  Eyes: EOM are normal.  Neck: Normal range of motion. Neck supple. No tracheal deviation present. No thyromegaly present.  Cardiovascular: Normal rate, S1 normal, S2 normal and normal heart sounds. Exam reveals no gallop.  No murmur heard. Pulses:      Dorsalis pedis pulses are 1+ on the right side, and 1+ on the left side.       Posterior tibial pulses are 1+ on the right side, and 1+ on the left side.  Pulmonary/Chest: Effort normal and breath sounds normal. No respiratory distress. He has no wheezes.  Abdominal: Soft. Bowel sounds are normal. He exhibits no distension. There is no tenderness. There is no guarding and no CVA tenderness.  Musculoskeletal: He exhibits no edema.       Right shoulder: He exhibits no swelling and no deformity.  Neurological: He is alert and oriented to person, place, and time. He has normal strength and normal reflexes. No cranial nerve deficit or sensory deficit. Gait normal.  Skin: Skin is warm and dry. No rash noted. No cyanosis. Nails show no clubbing.  Psychiatric: He has a normal mood and affect. His speech is normal. Judgment normal. Cognition and memory are normal.  Patient is accompanied by his son and daughter-in-law.  He has reluctant affect.      CMP ( most recent) CMP     Component Value Date/Time   NA 136 10/15/2017 0802   NA 134 (L) 08/22/2012 1857   K 4.3 10/15/2017 0802   K 4.5 08/22/2012 1857   CL 102 10/15/2017 0802   CL 101 08/22/2012 1857   CO2 24 10/15/2017 0802   CO2 23 08/22/2012 1857   GLUCOSE 170 (H) 10/15/2017 0802   GLUCOSE 379 (H) 08/22/2012 1857    BUN 16 10/15/2017 0802   BUN 6 (L) 08/22/2012 1857   CREATININE 0.85 10/15/2017 0802   CREATININE 0.61 08/22/2012 1857   CALCIUM 9.2 10/15/2017 0802   CALCIUM 9.1 08/22/2012 1857   PROT 8.2 (H) 10/10/2017 1610  PROT 8.4 (H) 08/22/2012 1857   ALBUMIN 2.5 (L) 10/10/2017 0634   ALBUMIN 3.9 08/22/2012 1857   AST 17 10/10/2017 0634   AST 40 (H) 08/22/2012 1857   ALT 18 10/10/2017 0634   ALT 39 08/22/2012 1857   ALKPHOS 84 10/10/2017 0634   ALKPHOS 99 08/22/2012 1857   BILITOT 0.4 10/10/2017 0634   BILITOT 0.3 08/22/2012 1857   GFRNONAA >60 10/15/2017 0802   GFRNONAA >60 08/22/2012 1857   GFRAA >60 10/15/2017 0802   GFRAA >60 08/22/2012 1857     Diabetic Labs (most recent): Lab Results  Component Value Date   HGBA1C 11.4 (H) 10/02/2017     Assessment & Plan:   1. DM type 2 causing vascular disease ([place]) - [name] has currently uncontrolled symptomatic type 2 DM since 57 years of age,  with most recent A1c of 11.4 %. Recent labs reviewed.  -his diabetes is complicated by peripheral neuropathy, peripheral arterial disease complicated by left BKA and he remains at extremely high risk for more acute and chronic complications which include CAD, CVA, CKD, retinopathy, and neuropathy. These are all discussed in detail with him.  - I have counseled him on diet management and weight loss, by adopting a carbohydrate restricted/protein rich diet.  - Suggestion is made for him to avoid simple carbohydrates  from his diet including Cakes, Sweet Desserts, Ice Cream, Soda (diet and regular), Sweet Tea, Candies, Chips, Cookies, Store Bought Juices, Alcohol in Excess of  1-2 drinks a day, Artificial Sweeteners,  Coffee Creamer, and "Sugar-free" Products. This will help patient to have more stable blood glucose profile and potentially avoid unintended weight gain.  - I encouraged him to switch to  unprocessed or minimally processed complex starch and increased protein intake (animal  or plant source), fruits, and vegetables.  - he is advised to stick to a routine mealtimes to eat 3 meals  a day and avoid unnecessary snacks ( to snack only to correct hypoglycemia).   - he will be scheduled with [name], RDN, CDE for individualized diabetes education.  - I have approached him with the following individualized plan to manage diabetes and patient agrees:   -Based on his current glycemic burden, he will likely require intensive treatment with basal/bolus insulin in order for him to achieve and maintain control of diabetes to target.   -In preparation, I approached him to start strict monitoring of blood glucose 4 times a day-before meals and at bedtime. -In the meantime, I advised him to increase Lantus to 30 units and switch to nightly. - Patient is warned not to take insulin without proper monitoring per orders. -Adjustment parameters are given for hypo and hyperglycemia in writing. - he is encouraged to call clinic for blood glucose levels less than 70 or above 300 mg /dl. - I advised him to lower his metformin 500 mg ER twice daily after breakfast and supper, therapeutically suitable for patient .  - he is not a candidate for SGLT2 inhibitors due to recent amputation as a complication of peripheral arterial disease and peripheral neuropathy.  - he will be considered for incretin therapy as appropriate next visit. - Patient specific target  A1c;  LDL, HDL, Triglycerides, and  Waist Circumference were discussed in detail.  2) BP/HTN:  his blood pressure is controlled to target at blood pressure.   he is advised to continue his current medications including amlodipine 10 mg p.o. daily with breakfast as well as metoprolol 25 mg p.o. daily.  3) Lipids/HPL:   No recent lipid panel to review.   he  is advised to continue Crestor 5mg  daily at bedtime.  Side effects and precautions discussed with him.  4)  Weight/Diet:  Body mass index is 35.4 kg/m.  - clearly complicating  his diabetes care. CDE Consult will be initiated . Exercise, and detailed carbohydrates information provided  -  detailed on discharge instructions.  5) Chronic Care/Health Maintenance:  -he  Is on Statin medications and  is encouraged to initiate and continue to follow up with Ophthalmology, Dentist,  Podiatrist at least yearly or according to recommendations, and advised to  stay away from smoking. I have recommended yearly flu vaccine and pneumonia vaccine at least every 5 years; he is advised on exercise of upper body plus stationary devices due to his limitation from left BKA,  and  sleep for at least 7 hours a day.  - I advised patient to maintain close follow up with Vidal Schwalbe, MD for primary care needs.  - Time spent with the patient: 45 minutes, of which >50% was spent in obtaining information about his symptoms, reviewing his previous labs, evaluations, and treatments, counseling him about his currently uncontrolled, complicated type 2 diabetes; lipidemia, hypertension, and developing developing  plans for long term treatment based on the latest recommendations.  Ercole Lemoine participated in the discussions, expressed understanding, and voiced agreement with the above plans.  All questions were answered to his satisfaction. he is encouraged to contact clinic should he have any questions or concerns prior to his return visit.  Follow up plan: - Return in about 10 days (around 12/22/2017) for Follow up with Meter and Logs Only - no Labs.  Glade Lloyd, MD [facility] [address] [place], [place] [address] Phone: [phone]  Fax: [phone]    12/12/2017, 1:24 PM  This note was partially dictated with voice recognition software. Similar sounding words can be transcribed inadequately or may not  be corrected upon review.

## 2017-12-12 NOTE — Patient Instructions (Signed)

## 2017-12-27 ENCOUNTER — Telehealth (INDEPENDENT_AMBULATORY_CARE_PROVIDER_SITE_OTHER): Payer: Self-pay | Admitting: Orthopedic Surgery

## 2017-12-27 ENCOUNTER — Encounter (INDEPENDENT_AMBULATORY_CARE_PROVIDER_SITE_OTHER): Payer: Self-pay | Admitting: Orthopedic Surgery

## 2017-12-27 ENCOUNTER — Ambulatory Visit (INDEPENDENT_AMBULATORY_CARE_PROVIDER_SITE_OTHER): Payer: Medicaid Other | Admitting: Physician Assistant

## 2017-12-27 VITALS — Ht 67.0 in | Wt 226.0 lb

## 2017-12-27 DIAGNOSIS — Z89512 Acquired absence of left leg below knee: Secondary | ICD-10-CM

## 2017-12-27 DIAGNOSIS — E440 Moderate protein-calorie malnutrition: Secondary | ICD-10-CM

## 2017-12-27 DIAGNOSIS — E119 Type 2 diabetes mellitus without complications: Secondary | ICD-10-CM

## 2017-12-27 DIAGNOSIS — Z794 Long term (current) use of insulin: Secondary | ICD-10-CM

## 2017-12-27 MED ORDER — OXYCODONE HCL 5 MG PO TABS: 5.0000 mg | ORAL_TABLET | Freq: Three times a day (TID) | ORAL | 0 refills | Status: DC | PRN

## 2017-12-28 ENCOUNTER — Encounter (INDEPENDENT_AMBULATORY_CARE_PROVIDER_SITE_OTHER): Payer: Self-pay | Admitting: Physician Assistant

## 2017-12-28 NOTE — Progress Notes (Signed)
Office Visit Note   Patient: [name]           Date of Birth: 19-Apr-1960           MRN: [id number] Visit Date: 12/27/2017              Requested by: Vidal Schwalbe, MD [address] [place], [place] [address] PCP: Vidal Schwalbe, MD  Chief Complaint  Patient presents with  . Left Foot - Follow-up, Routine Post Op      HPI: Patient is a 57 year old male who is seen for postoperative follow-up following a left transtibial amputation on 10/05/2017.  He is continued to do well.  He is utilizing his stump shrinker stocking as instructed and only having mild pain intermittently.  He is is having a lot of phantom pain at times and is on Neurontin 3 times a day.  He he is working with biotech for prosthetic evaluation.  Assessment & Plan: Visit Diagnoses:  1. 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 Type 2 diabetes mellitus treated with insulin ([place])   3. Protein-calorie malnutrition, moderate ([place])     Plan: Continue to work with biotech for prosthesis.  Continue stump shrinker.  We did refill his OxyIR today.  He will follow-up in 4 weeks or sooner should he have difficulties in the interim.  Follow-Up Instructions: Return in about 4 weeks (around 01/24/2018).   Ortho Exam  Patient is alert, oriented, no adenopathy, well-dressed, normal affect, normal respiratory effort. The left transtibial amputation is well-healed.  There is very minimal edema.  There are no signs of cellulitis.  He is utilizing his stump shrinker stocking.  He has full extension and good flexion of the left knee.  Imaging: No results found. No images are attached to the encounter.  Labs: Lab Results  Component Value Date   HGBA1C 11.4 (H) 10/02/2017   ESRSEDRATE 67 (H) 08/05/2011   LABURIC 7.5 04/05/2016   LABURIC 7.7 (H) 04/04/2016   REPTSTATUS 10/07/2017 FINAL 10/02/2017   CULT  10/02/2017    NO GROWTH 5 DAYS Performed at [facility], [place] [address],  [place], [place] [address]      Lab Results  Component Value Date   ALBUMIN 2.5 (L) 10/10/2017   ALBUMIN 2.3 (L) 10/08/2017   ALBUMIN 2.3 (L) 10/07/2017   LABURIC 7.5 04/05/2016   LABURIC 7.7 (H) 04/04/2016    Body mass index is 35.4 kg/m.  Orders:  No orders of the defined types were placed in this encounter.  Meds ordered this encounter  Medications  . oxyCODONE (OXY IR/ROXICODONE) 5 MG immediate release tablet    Sig: Take 1 tablet (5 mg total) by mouth every 8 (eight) hours as needed for severe pain.    Dispense:  28 tablet    Refill:  0     Procedures: No procedures performed  Clinical Data: No additional findings.  ROS:  All other systems negative, except as noted in the HPI. Review of Systems  Objective: Vital Signs: Ht 5\' 7"  (1.702 m)   Wt 226 lb (102.5 kg)   BMI 35.40 kg/m   Specialty Comments:  No specialty comments available.  PMFS History: Patient Active Problem List   Diagnosis Date Noted  . Mixed hyperlipidemia 12/12/2017  . S/P BKA (below knee amputation) unilateral, left ([place]) 11/29/2017  . Unilateral complete BKA, right, sequela ([place])   . Thrombocytosis ([place])   . Constipation due to pain medication   .  Below knee amputation status, left ([place])   . Unilateral complete BKA, left, initial encounter ([place])   . Acute blood loss anemia   . Post-operative pain   . Essential hypertension, benign   . Tobacco abuse   . S/P amputation   . Subacute osteomyelitis, left ankle and foot ([place])   . Acute osteomyelitis of left calcaneus ([facility])   . Cutaneous abscess of left foot   . Severe protein-calorie malnutrition ([place])   . PAD (peripheral artery disease) ([place])   . Wound infection 10/02/2017  . Type 2 diabetes mellitus treated with insulin ([place])   . Tobacco use disorder   . Lymphedema 05/28/2017  . Essential hypertension 05/28/2017  . DM type 2 causing vascular disease ([place]) 05/28/2017  . DJD (degenerative joint disease) 05/28/2017  . Hyponatremia  04/05/2016  . Anemia 04/05/2016  . Gout 04/05/2016  . Bilateral foot pain 04/04/2016   Past Medical History:  Diagnosis Date  . Diabetes mellitus without complication ([place])   . Gout   . Hyperlipidemia   . Hypertension   . Insomnia     Family History  Problem Relation Age of Onset  . Diabetes Mother     Past Surgical History:  Procedure Laterality Date  . AMPUTATION Left 10/05/2017   Procedure: LEFT BELOW KNEE AMPUTATION;  Surgeon: [name], MD;  Location: [facility];  Service: Orthopedics;  Laterality: Left;  . JOINT REPLACEMENT     right knee  . LEG SURGERY     Social History   Occupational History  . Not on file  Tobacco Use  . Smoking status: Former Smoker    Packs/day: 1.00    Years: 50.00    Pack years: 50.00    Types: Cigarettes  . Smokeless tobacco: Never Used  Substance and Sexual Activity  . Alcohol use: Not Currently  . Drug use: No  . Sexual activity: Not on file

## 2017-12-31 ENCOUNTER — Ambulatory Visit (INDEPENDENT_AMBULATORY_CARE_PROVIDER_SITE_OTHER): Payer: Medicaid Other | Admitting: "Endocrinology

## 2017-12-31 ENCOUNTER — Encounter: Payer: Self-pay | Admitting: "Endocrinology

## 2017-12-31 VITALS — BP 128/78 | HR 58 | Ht 67.0 in

## 2017-12-31 DIAGNOSIS — I10 Essential (primary) hypertension: Secondary | ICD-10-CM | POA: Diagnosis not present

## 2017-12-31 DIAGNOSIS — E1159 Type 2 diabetes mellitus with other circulatory complications: Secondary | ICD-10-CM | POA: Diagnosis not present

## 2017-12-31 DIAGNOSIS — E782 Mixed hyperlipidemia: Secondary | ICD-10-CM | POA: Diagnosis not present

## 2017-12-31 MED ORDER — INSULIN GLARGINE 100 UNIT/ML ~~LOC~~ SOLN: 50.0000 [IU] | Freq: Every day | SUBCUTANEOUS | 0 refills | Status: DC

## 2017-12-31 NOTE — Progress Notes (Signed)
Endocrinology follow-up  Note       12/31/2017, 12:46 PM   Subjective:    Patient ID: [name], male    DOB: 07-Feb-1961.  Nichlas Dragan is being seen in follow-up for management of currently uncontrolled complicated symptomatic  Type 2 diabetes requested by  Vidal Schwalbe, MD.   Past Medical History:  Diagnosis Date  . Diabetes mellitus without complication ([place])   . Gout   . Hyperlipidemia   . Hypertension   . Insomnia    Past Surgical History:  Procedure Laterality Date  . AMPUTATION Left 10/05/2017   Procedure: LEFT BELOW KNEE AMPUTATION;  Surgeon: [name], MD;  Location: [facility];  Service: Orthopedics;  Laterality: Left;  . JOINT REPLACEMENT     right knee  . LEG SURGERY     Social History   Socioeconomic History  . Marital status: Single    Spouse name: Not on file  . Number of children: Not on file  . Years of education: Not on file  . Highest education level: Not on file  Occupational History  . Not on file  Social Needs  . Financial resource strain: Not on file  . Food insecurity:    Worry: Not on file    Inability: Not on file  . Transportation needs:    Medical: Not on file    Non-medical: Not on file  Tobacco Use  . Smoking status: Former Smoker    Packs/day: 1.00    Years: 50.00    Pack years: 50.00    Types: Cigarettes  . Smokeless tobacco: Never Used  Substance and Sexual Activity  . Alcohol use: Not Currently  . Drug use: No  . Sexual activity: Not on file  Lifestyle  . Physical activity:    Days per week: Not on file    Minutes per session: Not on file  . Stress: Not on file  Relationships  . Social connections:    Talks on phone: Not on file    Gets together: Not on file    Attends religious service: Not on file    Active member of club or organization: Not on file    Attends meetings of clubs or organizations: Not on file     Relationship status: Not on file  Other Topics Concern  . Not on file  Social History Narrative  . Not on file   Outpatient Encounter Medications as of 12/31/2017  Medication Sig  . acetaminophen (TYLENOL) 325 MG tablet Take 2 tablets (650 mg total) by mouth every 6 (six) hours.  Marland Kitchen amitriptyline (ELAVIL) 25 MG tablet Take 25 mg by mouth at bedtime.  Marland Kitchen amLODipine (NORVASC) 10 MG tablet Take 1 tablet (10 mg total) by mouth daily.  Marland Kitchen aspirin 81 MG chewable tablet Chew 81 mg by mouth daily.  Marland Kitchen gabapentin (NEURONTIN) 300 MG capsule Take 1 capsule (300 mg total) by mouth 3 (three) times daily.  . insulin glargine (LANTUS) 100 UNIT/ML injection Inject 0.5 mLs (50 Units total) into the skin daily.  . Melatonin 3 MG TABS Take 1.5 tablets (4.5 mg total) by mouth at bedtime as  needed (sleep).  . metFORMIN (GLUCOPHAGE-XR) 500 MG 24 hr tablet Take 1 tablet (500 mg total) by mouth 2 (two) times daily after a meal. Take two pills with breakfast and two pills with supper  . metoprolol succinate (TOPROL-XL) 25 MG 24 hr tablet Take 1 tablet (25 mg total) by mouth daily.  . Multiple Vitamin (MULTIVITAMIN WITH MINERALS) TABS tablet Take 1 tablet by mouth daily.  Marland Kitchen oxyCODONE (OXY IR/ROXICODONE) 5 MG immediate release tablet Take 1 tablet (5 mg total) by mouth every 8 (eight) hours as needed for severe pain.  . pantoprazole (PROTONIX) 40 MG tablet Take 1 tablet (40 mg total) by mouth daily.  . polyethylene glycol (MIRALAX / GLYCOLAX) packet Take 17 g by mouth daily.  . rosuvastatin (CRESTOR) 5 MG tablet Take 5 mg by mouth daily.  . [DISCONTINUED] insulin glargine (LANTUS) 100 UNIT/ML injection Inject 0.3 mLs (30 Units total) into the skin daily.   No facility-administered encounter medications on file as of 12/31/2017.     ALLERGIES: No Known Allergies  VACCINATION STATUS:  There is no immunization history on file for this patient.  Diabetes  He presents for his follow-up diabetic visit. He has type 2  diabetes mellitus. Onset time: He was diagnosed at approximate age of 57 years. His disease course has been worsening. There are no hypoglycemic associated symptoms. Pertinent negatives for hypoglycemia include no confusion, headaches, pallor or seizures. Associated symptoms include blurred vision, polydipsia and polyuria. Pertinent negatives for diabetes include no chest pain, no fatigue, no polyphagia and no weakness. There are no hypoglycemic complications. Symptoms are worsening. Diabetic complications include nephropathy and PVD. (Patient is status post left below-knee amputation as a complication of diabetes.) Risk factors for coronary artery disease include diabetes mellitus, dyslipidemia, family history, male sex, obesity, hypertension, sedentary lifestyle and tobacco exposure. Current diabetic treatment includes insulin injections and oral agent (monotherapy) (Is currently on Lantus 28 units daily in the morning and metformin 2000 g daily.). His weight is fluctuating minimally. He is following a generally unhealthy diet. When asked about meal planning, he reported none. He has not had a previous visit with a dietitian. 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 His breakfast blood glucose range is generally >200 mg/dl. His lunch blood glucose range is generally >200 mg/dl. His dinner blood glucose range is generally >200 mg/dl. His bedtime blood glucose range is generally >200 mg/dl. His overall blood glucose range is >200 mg/dl. (He presents with significantly above target glycemic profile both fasting and postprandial.  His most recent A1c was 11.4%.    ) An ACE inhibitor/angiotensin II receptor blocker is not being taken. He does not see a podiatrist.Eye exam is current.  Hyperlipidemia  This is a chronic problem. The current episode started more than 1 year ago. Exacerbating diseases include diabetes and obesity. Pertinent negatives include no chest pain,  myalgias or shortness of breath. Current antihyperlipidemic treatment includes statins. Risk factors for coronary artery disease include dyslipidemia, diabetes mellitus, hypertension, male sex, obesity, a sedentary lifestyle and family history.  Hypertension  This is a chronic problem. The current episode started more than 1 year ago. Associated symptoms include blurred vision. Pertinent negatives include no chest pain, headaches, neck pain, palpitations or shortness of breath. Risk factors for coronary artery disease include dyslipidemia, family history, diabetes mellitus, obesity, male gender, sedentary lifestyle and smoking/tobacco exposure. Past treatments include calcium channel blockers and beta blockers. Hypertensive end-organ damage includes PVD.  Review of Systems  Constitutional: Negative for chills, fatigue, fever and unexpected weight change.  HENT: Negative for dental problem, mouth sores and trouble swallowing.   Eyes: Positive for blurred vision. Negative for visual disturbance.  Respiratory: Negative for cough, choking, chest tightness, shortness of breath and wheezing.   Cardiovascular: Negative for chest pain, palpitations and leg swelling.  Gastrointestinal: Negative for abdominal distention, abdominal pain, constipation, diarrhea, nausea and vomiting.  Endocrine: Positive for polydipsia and polyuria. Negative for polyphagia.  Genitourinary: Negative for dysuria, flank pain, hematuria and urgency.  Musculoskeletal: Positive for gait problem. Negative for back pain, myalgias and neck pain.       Patient is on a wheelchair due to recent left BKA.  Skin: Negative for pallor, rash and wound.  Neurological: Negative for seizures, syncope, weakness, numbness and headaches.  Psychiatric/Behavioral: Negative.  Negative for confusion and dysphoric mood.    Objective:    BP 128/78   Pulse (!) 58   Ht 5\' 7"  (1.702 m)   BMI 35.40 kg/m   Wt Readings from Last 3 Encounters:   12/27/17 226 lb (102.5 kg)  12/12/17 226 lb (102.5 kg)  11/29/17 226 lb (102.5 kg)     Physical Exam  Constitutional: He is oriented to person, place, and time. He appears well-developed and well-nourished. He is cooperative. No distress.  HENT:  Head: Normocephalic and atraumatic.  Eyes: EOM are normal.  Neck: Normal range of motion. Neck supple. No tracheal deviation present. No thyromegaly present.  Cardiovascular: Normal rate, S1 normal, S2 normal and normal heart sounds. Exam reveals no gallop.  No murmur heard. Pulses:      Dorsalis pedis pulses are 1+ on the right side, and 1+ on the left side.       Posterior tibial pulses are 1+ on the right side, and 1+ on the left side.  Pulmonary/Chest: Effort normal. No respiratory distress. He has no wheezes.  Abdominal: He exhibits no distension. There is no tenderness. There is no guarding and no CVA tenderness.  Musculoskeletal: He exhibits no edema.       Right shoulder: He exhibits no swelling and no deformity.  Neurological: He is alert and oriented to person, place, and time. He has normal strength and normal reflexes. No cranial nerve deficit or sensory deficit. Gait normal.  Skin: Skin is warm and dry. No rash noted. No cyanosis. Nails show no clubbing.  Psychiatric: He has a normal mood and affect. His speech is normal. Judgment normal. Cognition and memory are normal.  Patient is accompanied by his son .  He has reluctant affect.      CMP ( most recent) CMP     Component Value Date/Time   NA 136 10/15/2017 0802   NA 134 (L) 08/22/2012 1857   K 4.3 10/15/2017 0802   K 4.5 08/22/2012 1857   CL 102 10/15/2017 0802   CL 101 08/22/2012 1857   CO2 24 10/15/2017 0802   CO2 23 08/22/2012 1857   GLUCOSE 170 (H) 10/15/2017 0802   GLUCOSE 379 (H) 08/22/2012 1857   BUN 16 10/15/2017 0802   BUN 6 (L) 08/22/2012 1857   CREATININE 0.85 10/15/2017 0802   CREATININE 0.61 08/22/2012 1857   CALCIUM 9.2 10/15/2017 0802   CALCIUM  9.1 08/22/2012 1857   PROT 8.2 (H) 10/10/2017 0634   PROT 8.4 (H) 08/22/2012 1857   ALBUMIN 2.5 (L) 10/10/2017 0634   ALBUMIN 3.9 08/22/2012 1857   AST 17 10/10/2017 0634   AST  40 (H) 08/22/2012 1857   ALT 18 10/10/2017 0634   ALT 39 08/22/2012 1857   ALKPHOS 84 10/10/2017 0634   ALKPHOS 99 08/22/2012 1857   BILITOT 0.4 10/10/2017 0634   BILITOT 0.3 08/22/2012 1857   GFRNONAA >60 10/15/2017 0802   GFRNONAA >60 08/22/2012 1857   GFRAA >60 10/15/2017 0802   GFRAA >60 08/22/2012 1857     Diabetic Labs (most recent): Lab Results  Component Value Date   HGBA1C 11.4 (H) 10/02/2017     Assessment & Plan:   1. DM type 2 causing vascular disease ([place]) - Kendell Molyneux has currently uncontrolled symptomatic type 2 DM since 57 years of age. -Presents with persistent hyperglycemia, moderately above target both fasting and postprandial.   Most recent A1c was 11.4%.  Recent labs reviewed.  -his diabetes is complicated by peripheral neuropathy, peripheral arterial disease complicated by left BKA and he remains at extremely high risk for more acute and chronic complications which include CAD, CVA, CKD, retinopathy, and neuropathy. These are all discussed in detail with him.  - I have counseled him on diet management and weight loss, by adopting a carbohydrate restricted/protein rich diet.  -He still admits to dietary discretion including unnecessary snacks and sweetened beverages.  -  Suggestion is made for him to avoid simple carbohydrates  from his diet including Cakes, Sweet Desserts / Pastries, Ice Cream, Soda (diet and regular), Sweet Tea, Candies, Chips, Cookies, Store Bought Juices, Alcohol in Excess of  1-2 drinks a day, Artificial Sweeteners, and "Sugar-free" Products. This will help patient to have stable blood glucose profile and potentially avoid unintended weight gain.  - I encouraged him to switch to  unprocessed or minimally processed complex starch and increased  protein intake (animal or plant source), fruits, and vegetables.  - he is advised to stick to a routine mealtimes to eat 3 meals  a day and avoid unnecessary snacks ( to snack only to correct hypoglycemia).   - he will be scheduled with Jearld Fenton, RDN, CDE for individualized diabetes education.  - I have approached him with the following individualized plan to manage diabetes and patient agrees:   -Based on his current glycemic burden, he has some room on his basal insulin before considering prandial insulin.   -He is advised to increase his dose to 50 units nightly, continue to monitor blood glucose 2 times a day-before breakfast and at bedtime.     - he is encouraged to call clinic for blood glucose levels less than 70 or above 300 mg /dl. - I advised him to lower his metformin 500 mg ER twice daily after breakfast and supper, therapeutically suitable for patient .  - he is not a candidate for SGLT2 inhibitors due to recent amputation as a complication of peripheral arterial disease and peripheral neuropathy.  - he will be considered for incretin therapy as appropriate next visit. - Patient specific target  A1c;  LDL, HDL, Triglycerides, and  Waist Circumference were discussed in detail.  2) BP/HTN:  his blood pressure is controlled to target at blood pressure.   he is advised to continue his current medications including amlodipine 10 mg p.o. daily with breakfast as well as metoprolol 25 mg p.o. Daily.  3) Lipids/HPL:   No recent lipid panel to review.   he  is advised to continue Crestor 5mg  daily at bedtime.  Side effects and precautions discussed with him.  He will have fasting lipid panel before his next visit.  4)  Weight/Diet:  Body mass index is 35.4 kg/m.  - clearly complicating his diabetes care. CDE Consult will be initiated . Exercise, and detailed carbohydrates information provided  -  detailed on discharge instructions.  5) Chronic Care/Health Maintenance:  -he  Is  on Statin medications and  is encouraged to initiate and continue to follow up with Ophthalmology, Dentist,  Podiatrist at least yearly or according to recommendations, and advised to  stay away from smoking. I have recommended yearly flu vaccine and pneumonia vaccine at least every 5 years; he is advised on exercise of upper body plus stationary devices due to his limitation from left BKA,  and  sleep for at least 7 hours a day.  - I advised patient to maintain close follow up with Vidal Schwalbe, MD for primary care needs.  - Time spent with the patient: 25 min, of which >50% was spent in reviewing his blood glucose logs , discussing his hypo- and hyper-glycemic episodes, reviewing his current and  previous labs and insulin doses and developing a plan to avoid hypo- and hyper-glycemia. Please refer to Patient Instructions for Blood Glucose Monitoring and Insulin/Medications Dosing Guide"  in media tab for additional information. Whitaker Gigante participated in the discussions, expressed understanding, and voiced agreement with the above plans.  All questions were answered to his satisfaction. he is encouraged to contact clinic should he have any questions or concerns prior to his return visit.   Follow up plan: - Return in about 5 weeks (around 02/04/2018) for Follow up with Pre-visit Labs, Meter, and Logs.  Glade Lloyd, MD [facility] [address] [place], [place] [address] Phone: [phone]  Fax: [phone]    12/31/2017, 12:46 PM  This note was partially dictated with voice recognition software. Similar sounding words can be transcribed inadequately or may not  be corrected upon review.

## 2017-12-31 NOTE — Patient Instructions (Signed)

## 2018-01-21 ENCOUNTER — Other Ambulatory Visit: Payer: Self-pay

## 2018-01-21 MED ORDER — METFORMIN HCL ER 500 MG PO TB24: 500.0000 mg | ORAL_TABLET | Freq: Two times a day (BID) | ORAL | 2 refills | Status: DC

## 2018-01-22 ENCOUNTER — Encounter: Payer: Self-pay | Admitting: Nutrition

## 2018-01-22 ENCOUNTER — Encounter: Payer: Medicaid Other | Attending: Emergency Medicine | Admitting: Nutrition

## 2018-01-22 VITALS — Ht 67.0 in | Wt 210.0 lb

## 2018-01-22 DIAGNOSIS — Z713 Dietary counseling and surveillance: Secondary | ICD-10-CM | POA: Insufficient documentation

## 2018-01-22 DIAGNOSIS — IMO0002 Reserved for concepts with insufficient information to code with codable children: Secondary | ICD-10-CM

## 2018-01-22 DIAGNOSIS — E1159 Type 2 diabetes mellitus with other circulatory complications: Secondary | ICD-10-CM | POA: Insufficient documentation

## 2018-01-22 DIAGNOSIS — E118 Type 2 diabetes mellitus with unspecified complications: Secondary | ICD-10-CM

## 2018-01-22 DIAGNOSIS — E1165 Type 2 diabetes mellitus with hyperglycemia: Secondary | ICD-10-CM

## 2018-01-22 NOTE — Patient Instructions (Addendum)
Goals 1. Follow My Plate 2. Eat 3-4 carb choices per meal 3. Keep drinking water 4. Don't skip meals 5. Take 50 units of lantus daily-give in abdomen area instead of arms or legs. Marland Kitchen

## 2018-01-22 NOTE — Progress Notes (Signed)
  Medical Nutrition Therapy:  Appt start time: 0930 end time:  1030.   Assessment:  Primary concerns today: Diabetes Typd 2.  He lives alone. Prepaars hiis own meals. PMH: Alcohol abuse, HTN and Hyperlipidemia.Her with his son and daughter in law. Just got a prostesis yesteray. Had right  AKA on  5th of July 2019.    Lives by himself and cooks for himself. Currently taking 50 units of Lantus daily and suppose to be taking Metformin 500 mg BID. Admits to forgetting to take it at times. .  Sees DR. Nida, Endocrinology..  Lost 13 lbs in the last few months. Tries to eat three meals per day. Doesn't eat breakfast daily. His son does his shopping for him. Testing blood sugars  BS in am and pm 200's since taking insulin per Dr. Dorris Fetch. No able to ambulate much right now. Willing to work on eating habits to improve health and his DM.   Lab Results  Component Value Date   HGBA1C 11.4 (H) 10/02/2017     Preferred Learning Style:   Auditory  Visual  Hands on  Learning Readiness:   Ready  Change in progress   MEDICATIONS:   DIETARY INTAKE:    24-hr recall:  B ( AM): Rainbran, cherrios, corn flakes, 2% milk,   Snk ( AM):   L ( PM): Bologna Sandwich with cheese on wheat bread, water Snk ( PM):  D ( PM): Chicken, water Snk ( PM):  Beverages: water  Usual physical activity: ADL   Estimated energy needs: 1800  calories 200 g carbohydrates 135 g protein 50 g fat  Progress Towards Goal(s):  In progress.   Nutritional Diagnosis:  NB-1.1 Food and nutrition-related knowledge deficit As related to Diabetes Type 2.  As evidenced by A1C 11.4%.    Intervention:  Nutrition and Diabetes education provided on My Plate, CHO counting, meal planning, portion sizes, timing of meals, avoiding snacks between meals unless having a low blood sugar, target ranges for A1C and blood sugars, signs/symptoms and treatment of hyper/hypoglycemia, monitoring blood sugars, taking medications as  prescribed, benefits of exercising 30 minutes per day and prevention of complications of DM. Marland Kitchen Goals 1. Follow My Plate 2. Eat 3-4 carb choices per meal 3. Keep drinking water 4. Don't skip meals 5. Take 50 units of lantus daily-give in abdomen area instead of arms or legs. . Teaching Method Utilized:  Visual Auditory Hands on  Handouts given during visit include:  The Plate Method   Meal Plan Card  Diabetes Instructions.  Barriers to learning/adherence to lifestyle change: None  Demonstrated degree of understanding via:  Teach Back   Monitoring/Evaluation:  Dietary intake, exercise, meal planning, and body weight in 1 month(s). SUggested he make an appt with dentist and eye dr.

## 2018-01-24 ENCOUNTER — Ambulatory Visit (INDEPENDENT_AMBULATORY_CARE_PROVIDER_SITE_OTHER): Payer: Medicaid Other | Admitting: Physician Assistant

## 2018-01-30 ENCOUNTER — Encounter: Payer: Medicaid Other | Admitting: Physical Medicine & Rehabilitation

## 2018-01-31 ENCOUNTER — Telehealth: Payer: Self-pay

## 2018-01-31 NOTE — Telephone Encounter (Signed)
Advise to increase lantus to 60 units qhs.

## 2018-01-31 NOTE — Telephone Encounter (Signed)
No answer. VM not set up. Will try again later

## 2018-01-31 NOTE — Telephone Encounter (Signed)
Pt states he has had high BG readings.   Date Before breakfast Before lunch Before supper Bedtime  10/29 319     10/30 401     10/31 362             Pt taking: Lantus 50 units qhs, MTF XR 500 mg bid

## 2018-02-01 LAB — LIPID PANEL
Cholesterol: 134 mg/dL (ref <200)
HDL: 19 mg/dL — ABNORMAL LOW (ref >40)
LDL CHOLESTEROL (CALC): 68 mg/dL
NON-HDL CHOLESTEROL (CALC): 115 mg/dL (ref <130)
Total CHOL/HDL Ratio: 7.1 (calc) — ABNORMAL HIGH (ref <5.0)
Triglycerides: 386 mg/dL — ABNORMAL HIGH (ref <150)

## 2018-02-01 LAB — HEMOGLOBIN A1C
EAG (MMOL/L): 15.2 (calc)
Hgb A1c MFr Bld: 11.2 % of total Hgb — ABNORMAL HIGH (ref <5.7)
MEAN PLASMA GLUCOSE: 275 (calc)

## 2018-02-01 LAB — COMPLETE METABOLIC PANEL WITH GFR
AG Ratio: 1.3 (calc) (ref 1.0–2.5)
ALT: 14 U/L (ref 9–46)
AST: 12 U/L (ref 10–35)
Albumin: 4.7 g/dL (ref 3.6–5.1)
Alkaline phosphatase (APISO): 83 U/L (ref 40–115)
BUN: 25 mg/dL (ref 7–25)
CALCIUM: 10.2 mg/dL (ref 8.6–10.3)
CO2: 25 mmol/L (ref 20–32)
CREATININE: 0.93 mg/dL (ref 0.70–1.33)
Chloride: 96 mmol/L — ABNORMAL LOW (ref 98–110)
GFR, EST NON AFRICAN AMERICAN: 90 mL/min/{1.73_m2} (ref >=60)
GFR, Est African American: 104 mL/min/{1.73_m2} (ref >=60)
Globulin: 3.7 g/dL (calc) (ref 1.9–3.7)
Glucose, Bld: 358 mg/dL — ABNORMAL HIGH (ref 65–99)
POTASSIUM: 4.5 mmol/L (ref 3.5–5.3)
Sodium: 131 mmol/L — ABNORMAL LOW (ref 135–146)
Total Bilirubin: 0.3 mg/dL (ref 0.2–1.2)
Total Protein: 8.4 g/dL — ABNORMAL HIGH (ref 6.1–8.1)

## 2018-02-01 LAB — T4, FREE: FREE T4: 1 ng/dL (ref 0.8–1.8)

## 2018-02-01 LAB — MICROALBUMIN / CREATININE URINE RATIO
Creatinine, Urine: 80 mg/dL (ref 20–320)
MICROALB UR: 1.4 mg/dL
Microalb Creat Ratio: 18 mcg/mg creat (ref <30)

## 2018-02-01 LAB — VITAMIN D 25 HYDROXY (VIT D DEFICIENCY, FRACTURES): Vit D, 25-Hydroxy: 20 ng/mL — ABNORMAL LOW (ref 30–100)

## 2018-02-01 LAB — TSH: TSH: 2.89 m[IU]/L (ref 0.40–4.50)

## 2018-02-01 MED ORDER — INSULIN GLARGINE 100 UNIT/ML SOLOSTAR PEN: 60.0000 [IU] | PEN_INJECTOR | Freq: Every day | SUBCUTANEOUS | 2 refills | Status: DC

## 2018-02-04 ENCOUNTER — Ambulatory Visit (INDEPENDENT_AMBULATORY_CARE_PROVIDER_SITE_OTHER): Payer: Medicaid Other | Admitting: Physician Assistant

## 2018-02-04 ENCOUNTER — Encounter (INDEPENDENT_AMBULATORY_CARE_PROVIDER_SITE_OTHER): Payer: Self-pay | Admitting: Physician Assistant

## 2018-02-04 VITALS — Ht 67.0 in | Wt 210.0 lb

## 2018-02-04 DIAGNOSIS — Z89512 Acquired absence of left leg below knee: Secondary | ICD-10-CM

## 2018-02-04 DIAGNOSIS — E440 Moderate protein-calorie malnutrition: Secondary | ICD-10-CM

## 2018-02-04 DIAGNOSIS — E119 Type 2 diabetes mellitus without complications: Secondary | ICD-10-CM

## 2018-02-04 DIAGNOSIS — Z794 Long term (current) use of insulin: Secondary | ICD-10-CM

## 2018-02-05 ENCOUNTER — Encounter (INDEPENDENT_AMBULATORY_CARE_PROVIDER_SITE_OTHER): Payer: Self-pay | Admitting: Physician Assistant

## 2018-02-05 NOTE — Progress Notes (Signed)
Office Visit Note   Patient: [name]           Date of Birth: Jan 17, 1961           MRN: [id number] Visit Date: 02/04/2018              Requested by: Vidal Schwalbe, MD [address] [place], [place] [address] PCP: Vidal Schwalbe, MD  Chief Complaint  Patient presents with  . Left Leg - Follow-up    Left BKA   [employer]      HPI: The patient is a 57 yo male who is seen for post operative follow up following a Left transtibial amputation 10/05/17. He has been working with [employer] who fabricated his prosthesis. He comes in today with his family.  He needs orders for Physical therapy and orders for more stump shrinkers as he lost them in a recent move.    Assessment & Plan: Visit Diagnoses:  1. 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 Type 2 diabetes mellitus treated with insulin ([place])   3. Protein-calorie malnutrition, moderate ([place])     Plan: Prescriptions for [employer] for stump shrinker stockings and Prescription for Physical therapy given to patient. He would like to get PT at [facility] in [place] if possible. He will follow up here on a prn basis.   Follow-Up Instructions: Return if symptoms worsen or fail to improve.   Ortho Exam  Patient is alert, oriented, no adenopathy, well-dressed, normal affect, normal respiratory effort. The left transtibial amputation is well healed. No ulcers or pressure areas. No signs of infection or cellulitis. Minimal edema of residual limb. Ambulates with walker.   Imaging: No results found. No images are attached to the encounter.  Labs: Lab Results  Component Value Date   HGBA1C 11.2 (H) 01/31/2018   HGBA1C 11.4 (H) 10/02/2017   ESRSEDRATE 67 (H) 08/05/2011   LABURIC 7.5 04/05/2016   LABURIC 7.7 (H) 04/04/2016   REPTSTATUS 10/07/2017 FINAL 10/02/2017   CULT  10/02/2017    NO GROWTH 5 DAYS Performed at [facility], [place] [address], [place],  [address]      Lab Results    Component Value Date   ALBUMIN 2.5 (L) 10/10/2017   ALBUMIN 2.3 (L) 10/08/2017   ALBUMIN 2.3 (L) 10/07/2017   LABURIC 7.5 04/05/2016   LABURIC 7.7 (H) 04/04/2016    Body mass index is 32.89 kg/m.  Orders:  No orders of the defined types were placed in this encounter.  No orders of the defined types were placed in this encounter.    Procedures: No procedures performed  Clinical Data: No additional findings.  ROS:  All other systems negative, except as noted in the HPI. Review of Systems  Objective: Vital Signs: Ht 5\' 7"  (1.702 m)   Wt 210 lb (95.3 kg)   BMI 32.89 kg/m   Specialty Comments:  No specialty comments available.  PMFS History: Patient Active Problem List   Diagnosis Date Noted  . Mixed hyperlipidemia 12/12/2017  . S/P BKA (below knee amputation) unilateral, left ([place]) 11/29/2017  . Unilateral complete BKA, right, sequela ([place])   . Thrombocytosis ([place])   . Constipation due to pain medication   . Below knee amputation status, left   . Unilateral complete BKA, left, initial encounter ([place])   . Acute blood loss anemia   . Post-operative pain   . Essential hypertension, benign   . Tobacco abuse   . S/P amputation   .  Subacute osteomyelitis, left ankle and foot ([place])   . Acute osteomyelitis of left calcaneus ([facility])   . Cutaneous abscess of left foot   . Severe protein-calorie malnutrition ([place])   . PAD (peripheral artery disease) ([place])   . Wound infection 10/02/2017  . Type 2 diabetes mellitus treated with insulin ([place])   . Tobacco use disorder   . Lymphedema 05/28/2017  . Essential hypertension 05/28/2017  . DM type 2 causing vascular disease ([place]) 05/28/2017  . DJD (degenerative joint disease) 05/28/2017  . Hyponatremia 04/05/2016  . Anemia 04/05/2016  . Gout 04/05/2016  . Bilateral foot pain 04/04/2016   Past Medical History:  Diagnosis Date  . Diabetes mellitus without complication ([place])   . Gout   . Hyperlipidemia   . Hypertension    . Insomnia     Family History  Problem Relation Age of Onset  . Diabetes Mother     Past Surgical History:  Procedure Laterality Date  . AMPUTATION Left 10/05/2017   Procedure: LEFT BELOW KNEE AMPUTATION;  Surgeon: [name], MD;  Location: [facility];  Service: Orthopedics;  Laterality: Left;  . JOINT REPLACEMENT     right knee  . LEG SURGERY     Social History   Occupational History  . Not on file  Tobacco Use  . Smoking status: Former Smoker    Packs/day: 1.00    Years: 50.00    Pack years: 50.00    Types: Cigarettes  . Smokeless tobacco: Never Used  Substance and Sexual Activity  . Alcohol use: Not Currently  . Drug use: No  . Sexual activity: Not on file

## 2018-02-06 ENCOUNTER — Encounter: Payer: Self-pay | Admitting: Nutrition

## 2018-02-06 ENCOUNTER — Encounter: Payer: Self-pay | Admitting: "Endocrinology

## 2018-02-06 ENCOUNTER — Ambulatory Visit (INDEPENDENT_AMBULATORY_CARE_PROVIDER_SITE_OTHER): Payer: Medicaid Other | Admitting: "Endocrinology

## 2018-02-06 ENCOUNTER — Encounter: Payer: Medicaid Other | Attending: Emergency Medicine | Admitting: Nutrition

## 2018-02-06 VITALS — BP 148/86 | HR 71 | Ht 67.0 in | Wt 212.0 lb

## 2018-02-06 DIAGNOSIS — E1165 Type 2 diabetes mellitus with hyperglycemia: Secondary | ICD-10-CM

## 2018-02-06 DIAGNOSIS — I10 Essential (primary) hypertension: Secondary | ICD-10-CM

## 2018-02-06 DIAGNOSIS — E118 Type 2 diabetes mellitus with unspecified complications: Secondary | ICD-10-CM

## 2018-02-06 DIAGNOSIS — E559 Vitamin D deficiency, unspecified: Secondary | ICD-10-CM | POA: Diagnosis not present

## 2018-02-06 DIAGNOSIS — E1159 Type 2 diabetes mellitus with other circulatory complications: Secondary | ICD-10-CM

## 2018-02-06 DIAGNOSIS — E782 Mixed hyperlipidemia: Secondary | ICD-10-CM

## 2018-02-06 DIAGNOSIS — IMO0002 Reserved for concepts with insufficient information to code with codable children: Secondary | ICD-10-CM

## 2018-02-06 MED ORDER — VITAMIN D3 125 MCG (5000 UT) PO CAPS: 5000.0000 [IU] | ORAL_CAPSULE | Freq: Every day | ORAL | 0 refills | Status: DC

## 2018-02-06 MED ORDER — INSULIN GLARGINE 100 UNIT/ML SOLOSTAR PEN: 80.0000 [IU] | PEN_INJECTOR | Freq: Every day | SUBCUTANEOUS | 2 refills | Status: DC

## 2018-02-06 NOTE — Patient Instructions (Signed)

## 2018-02-06 NOTE — Progress Notes (Signed)
Endocrinology follow-up  Note       02/06/2018, 1:12 PM   Subjective:    Patient ID: [name], male    DOB: 1961/02/02.  [name] is being seen in follow-up for management of currently uncontrolled complicated symptomatic  Type 2 diabetes, hyperlipidemia, hypertension. PMD:   Vidal Schwalbe, MD.   Past Medical History:  Diagnosis Date  . Diabetes mellitus without complication ([place])   . Gout   . Hyperlipidemia   . Hypertension   . Insomnia    Past Surgical History:  Procedure Laterality Date  . AMPUTATION Left 10/05/2017   Procedure: LEFT BELOW KNEE AMPUTATION;  Surgeon: [name], MD;  Location: [facility];  Service: Orthopedics;  Laterality: Left;  . JOINT REPLACEMENT     right knee  . LEG SURGERY     Social History   Socioeconomic History  . Marital status: Single    Spouse name: Not on file  . Number of children: Not on file  . Years of education: Not on file  . Highest education level: Not on file  Occupational History  . Not on file  Social Needs  . Financial resource strain: Not on file  . Food insecurity:    Worry: Not on file    Inability: Not on file  . Transportation needs:    Medical: Not on file    Non-medical: Not on file  Tobacco Use  . Smoking status: Former Smoker    Packs/day: 1.00    Years: 50.00    Pack years: 50.00    Types: Cigarettes  . Smokeless tobacco: Never Used  Substance and Sexual Activity  . Alcohol use: Not Currently  . Drug use: No  . Sexual activity: Not on file  Lifestyle  . Physical activity:    Days per week: Not on file    Minutes per session: Not on file  . Stress: Not on file  Relationships  . Social connections:    Talks on phone: Not on file    Gets together: Not on file    Attends religious service: Not on file    Active member of club or organization: Not on file    Attends meetings of clubs or organizations:  Not on file    Relationship status: Not on file  Other Topics Concern  . Not on file  Social History Narrative  . Not on file   Outpatient Encounter Medications as of 02/06/2018  Medication Sig  . acetaminophen (TYLENOL) 325 MG tablet Take 2 tablets (650 mg total) by mouth every 6 (six) hours.  Marland Kitchen amitriptyline (ELAVIL) 25 MG tablet Take 25 mg by mouth at bedtime.  Marland Kitchen amLODipine (NORVASC) 10 MG tablet Take 1 tablet (10 mg total) by mouth daily.  Marland Kitchen aspirin 81 MG chewable tablet Chew 81 mg by mouth daily.  . Cholecalciferol (VITAMIN D3) 125 MCG (5000 UT) CAPS Take 1 capsule (5,000 Units total) by mouth daily.  Marland Kitchen gabapentin (NEURONTIN) 300 MG capsule Take 1 capsule (300 mg total) by mouth 3 (three) times daily.  . insulin glargine (LANTUS) 100 UNIT/ML injection Inject 0.5 mLs (50 Units total)  into the skin daily.  . Insulin Glargine (LANTUS) 100 UNIT/ML Solostar Pen Inject 80 Units into the skin at bedtime.  . Melatonin 3 MG TABS Take 1.5 tablets (4.5 mg total) by mouth at bedtime as needed (sleep).  . metFORMIN (GLUCOPHAGE-XR) 500 MG 24 hr tablet Take 1 tablet (500 mg total) by mouth 2 (two) times daily after a meal.  . metoprolol succinate (TOPROL-XL) 25 MG 24 hr tablet Take 1 tablet (25 mg total) by mouth daily.  . Multiple Vitamin (MULTIVITAMIN WITH MINERALS) TABS tablet Take 1 tablet by mouth daily.  Marland Kitchen oxyCODONE (OXY IR/ROXICODONE) 5 MG immediate release tablet Take 1 tablet (5 mg total) by mouth every 8 (eight) hours as needed for severe pain.  . pantoprazole (PROTONIX) 40 MG tablet Take 1 tablet (40 mg total) by mouth daily.  . polyethylene glycol (MIRALAX / GLYCOLAX) packet Take 17 g by mouth daily.  . rosuvastatin (CRESTOR) 5 MG tablet Take 5 mg by mouth daily.  . [DISCONTINUED] Insulin Glargine (LANTUS) 100 UNIT/ML Solostar Pen Inject 60 Units into the skin at bedtime.   No facility-administered encounter medications on file as of 02/06/2018.     ALLERGIES: No Known  Allergies  VACCINATION STATUS:  There is no immunization history on file for this patient.  Diabetes  He presents for his follow-up diabetic visit. He has type 2 diabetes mellitus. Onset time: He was diagnosed at approximate age of 19 years. His disease course has been worsening. There are no hypoglycemic associated symptoms. Pertinent negatives for hypoglycemia include no confusion, headaches, pallor or seizures. Associated symptoms include blurred vision, polydipsia and polyuria. Pertinent negatives for diabetes include no chest pain, no fatigue, no polyphagia and no weakness. There are no hypoglycemic complications. Symptoms are worsening. Diabetic complications include nephropathy and PVD. (Patient is status post left below-knee amputation as a complication of diabetes.) Risk factors for coronary artery disease include diabetes mellitus, dyslipidemia, family history, male sex, obesity, hypertension, sedentary lifestyle and tobacco exposure. Current diabetic treatment includes insulin injections and oral agent (monotherapy) (Is currently on Lantus 28 units daily in the morning and metformin 2000 g daily.). His weight is fluctuating minimally. He is following a generally unhealthy diet. When asked about meal planning, he reported none. He has not had a previous visit with a dietitian. 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 His breakfast blood glucose range is generally >200 mg/dl. His bedtime blood glucose range is generally >200 mg/dl. His overall blood glucose range is >200 mg/dl. (  ) An ACE inhibitor/angiotensin II receptor blocker is not being taken. He does not see a podiatrist.Eye exam is current.  Hyperlipidemia  This is a chronic problem. The current episode started more than 1 year ago. Exacerbating diseases include diabetes and obesity. Pertinent negatives include no chest pain, myalgias or shortness of breath. Current antihyperlipidemic  treatment includes statins. Risk factors for coronary artery disease include dyslipidemia, diabetes mellitus, hypertension, male sex, obesity, a sedentary lifestyle and family history.  Hypertension  This is a chronic problem. The current episode started more than 1 year ago. Associated symptoms include blurred vision. Pertinent negatives include no chest pain, headaches, neck pain, palpitations or shortness of breath. Risk factors for coronary artery disease include dyslipidemia, family history, diabetes mellitus, obesity, male gender, sedentary lifestyle and smoking/tobacco exposure. Past treatments include calcium channel blockers and beta blockers. Hypertensive end-organ damage includes PVD.     Review of Systems  Constitutional: Negative for chills, fatigue, fever  and unexpected weight change.  HENT: Negative for dental problem, mouth sores and trouble swallowing.   Eyes: Positive for blurred vision. Negative for visual disturbance.  Respiratory: Negative for cough, choking, chest tightness, shortness of breath and wheezing.   Cardiovascular: Negative for chest pain, palpitations and leg swelling.  Gastrointestinal: Negative for abdominal distention, abdominal pain, constipation, diarrhea, nausea and vomiting.  Endocrine: Positive for polydipsia and polyuria. Negative for polyphagia.  Genitourinary: Negative for dysuria, flank pain, hematuria and urgency.  Musculoskeletal: Positive for gait problem. Negative for back pain, myalgias and neck pain.       Patient is on a wheelchair due to recent left BKA.  Skin: Negative for pallor, rash and wound.  Neurological: Negative for seizures, syncope, weakness, numbness and headaches.  Psychiatric/Behavioral: Negative.  Negative for confusion and dysphoric mood.    Objective:    BP (!) 148/86   Pulse 71   Ht 5\' 7"  (1.702 m)   Wt 212 lb (96.2 kg)   BMI 33.20 kg/m   Wt Readings from Last 3 Encounters:  02/06/18 212 lb (96.2 kg)  02/04/18 210  lb (95.3 kg)  01/22/18 210 lb (95.3 kg)     Physical Exam  Constitutional: He is oriented to person, place, and time. He appears well-developed and well-nourished. He is cooperative. No distress.  Generally poor hygiene.  HENT:  Head: Normocephalic and atraumatic.  Eyes: EOM are normal.  Neck: Normal range of motion. Neck supple. No tracheal deviation present. No thyromegaly present.  Cardiovascular: Normal rate, S1 normal, S2 normal and normal heart sounds. Exam reveals no gallop.  No murmur heard. Pulses:      Dorsalis pedis pulses are 1+ on the right side, and 1+ on the left side.       Posterior tibial pulses are 1+ on the right side, and 1+ on the left side.  Pulmonary/Chest: Effort normal. No respiratory distress. He has no wheezes.  Abdominal: He exhibits no distension. There is no tenderness. There is no guarding and no CVA tenderness.  Musculoskeletal: He exhibits no edema.       Right shoulder: He exhibits no swelling and no deformity.  Patient is a wheelchair-bound due to recent left BKA.  Neurological: He is alert and oriented to person, place, and time. He has normal strength and normal reflexes. No cranial nerve deficit or sensory deficit. Gait normal.  Skin: Skin is warm and dry. No rash noted. No cyanosis. Nails show no clubbing.  Psychiatric: He has a normal mood and affect. His speech is normal. Judgment normal. Cognition and memory are normal.  Patient is accompanied by his son .  He has reluctant affect.      Diabetic Labs (most recent): Lab Results  Component Value Date   HGBA1C 11.2 (H) 01/31/2018   HGBA1C 11.4 (H) 10/02/2017   Recent Results (from the past 2160 hour(s))  Hemoglobin A1c     Status: Abnormal   Collection Time: 01/31/18  9:01 AM  Result Value Ref Range   Hgb A1c MFr Bld 11.2 (H) <5.7 % of total Hgb    Comment: For someone without known diabetes, a hemoglobin A1c value of 6.5% or greater indicates that they may have  diabetes and this  should be confirmed with a follow-up  test. . For someone with known diabetes, a value <7% indicates  that their diabetes is well controlled and a value  greater than or equal to 7% indicates suboptimal  control. A1c targets should be individualized based  on  duration of diabetes, age, comorbid conditions, and  other considerations. . Currently, no consensus exists regarding use of hemoglobin A1c for diagnosis of diabetes for children. .    Mean Plasma Glucose 275 (calc)   eAG (mmol/L) 15.2 (calc)  COMPLETE METABOLIC PANEL WITH GFR     Status: Abnormal   Collection Time: 01/31/18  9:01 AM  Result Value Ref Range   Glucose, Bld 358 (H) 65 - 99 mg/dL    Comment: .            Fasting reference interval . For someone without known diabetes, a glucose value >125 mg/dL indicates that they may have diabetes and this should be confirmed with a follow-up test. .    BUN 25 7 - 25 mg/dL   Creat 0.93 0.70 - 1.33 mg/dL    Comment: For patients >52 years of age, the reference limit for Creatinine is approximately 13% higher for people identified as African-American. .    GFR, Est Non African American 90 > OR = 60 mL/min/1.7m2   GFR, Est African American 104 > OR = 60 mL/min/1.20m2   BUN/Creatinine Ratio NOT APPLICABLE 6 - 22 (calc)   Sodium 131 (L) 135 - 146 mmol/L   Potassium 4.5 3.5 - 5.3 mmol/L   Chloride 96 (L) 98 - 110 mmol/L   CO2 25 20 - 32 mmol/L   Calcium 10.2 8.6 - 10.3 mg/dL   Total Protein 8.4 (H) 6.1 - 8.1 g/dL   Albumin 4.7 3.6 - 5.1 g/dL   Globulin 3.7 1.9 - 3.7 g/dL (calc)   AG Ratio 1.3 1.0 - 2.5 (calc)   Total Bilirubin 0.3 0.2 - 1.2 mg/dL   Alkaline phosphatase (APISO) 83 40 - 115 U/L   AST 12 10 - 35 U/L   ALT 14 9 - 46 U/L  Microalbumin / creatinine urine ratio     Status: None   Collection Time: 01/31/18  9:01 AM  Result Value Ref Range   Creatinine, Urine 80 20 - 320 mg/dL   Microalb, Ur 1.4 mg/dL    Comment: Reference Range Not established     Microalb Creat Ratio 18 <30 mcg/mg creat    Comment: . The ADA defines abnormalities in albumin excretion as follows: Marland Kitchen Category         Result (mcg/mg creatinine) . Normal                    <30 Microalbuminuria         30-299  Clinical albuminuria   > OR = 300 . The ADA recommends that at least two of three specimens collected within a 3-6 month period be abnormal before considering a patient to be within a diagnostic category.   Lipid panel     Status: Abnormal   Collection Time: 01/31/18  9:01 AM  Result Value Ref Range   Cholesterol 134 <200 mg/dL   HDL 19 (L) >40 mg/dL   Triglycerides 386 (H) <150 mg/dL    Comment: . If a non-fasting specimen was collected, consider repeat triglyceride testing on a fasting specimen if clinically indicated.  Yates Decamp et al. J. of Clin. Lipidol. 3825;0:539-767. Marland Kitchen    LDL Cholesterol (Calc) 68 mg/dL (calc)    Comment: Reference range: <100 . Desirable range <100 mg/dL for primary prevention;   <70 mg/dL for patients with CHD or diabetic patients  with > or = 2 CHD risk factors. Marland Kitchen LDL-C is now calculated using the Martin-Hopkins  calculation,  which is a validated novel method providing  better accuracy than the Friedewald equation in the  estimation of LDL-C.  Cresenciano Genre et al. Annamaria Helling. 9147;829(56): 2061-2068  (http://education.[facility].com/faq/FAQ164)    Total CHOL/HDL Ratio 7.1 (H) <5.0 (calc)   Non-HDL Cholesterol (Calc) 115 <130 mg/dL (calc)    Comment: For patients with diabetes plus 1 major ASCVD risk  factor, treating to a non-HDL-C goal of <100 mg/dL  (LDL-C of <70 mg/dL) is considered a therapeutic  option.   VITAMIN D 25 Hydroxy (Vit-D Deficiency, Fractures)     Status: Abnormal   Collection Time: 01/31/18  9:01 AM  Result Value Ref Range   Vit D, 25-Hydroxy 20 (L) 30 - 100 ng/mL    Comment: Vitamin D Status         25-OH Vitamin D: . Deficiency:                    <20 ng/mL Insufficiency:             20 - 29  ng/mL Optimal:                 > or = 30 ng/mL . For 25-OH Vitamin D testing on patients on  D2-supplementation and patients for whom quantitation  of D2 and D3 fractions is required, the QuestAssureD(TM) 25-OH VIT D, (D2,D3), LC/MS/MS is recommended: order  code 870 [id number] [id number] (patients >88yrs). . For more information on this test, go to: http://education.[facility].com/faq/FAQ163 (This link is being provided for  informational/educational purposes only.)   T4, free     Status: None   Collection Time: 01/31/18  9:01 AM  Result Value Ref Range   Free T4 1.0 0.8 - 1.8 ng/dL  TSH     Status: None   Collection Time: 01/31/18  9:01 AM  Result Value Ref Range   TSH 2.89 0.40 - 4.50 mIU/L     Lipid Panel     Component Value Date/Time   CHOL 134 01/31/2018 0901   TRIG 386 (H) 01/31/2018 0901   HDL 19 (L) 01/31/2018 0901   CHOLHDL 7.1 (H) 01/31/2018 0901   LDLCALC 68 01/31/2018 0901     Assessment & Plan:   1. DM type 2 causing vascular disease ([place]) - Sunil Halsted has currently uncontrolled symptomatic type 2 DM since 57 years of age. -Presents with persistent hyperglycemia, significantly above target fasting and at bedtime.  He did not monitor enough times daily.  His previsit labs show A1c of 11.2%, unchanged from his last visit.   Recent labs reviewed.  -his diabetes is complicated by peripheral neuropathy, peripheral arterial disease complicated by left BKA and he remains at extremely high risk for more acute and chronic complications which include CAD, CVA, CKD, retinopathy, and neuropathy. These are all discussed in detail with him.  - I have counseled him on diet management and weight loss, by adopting a carbohydrate restricted/protein rich diet.  -He still admits to dietary discretion including unnecessary snacks and sweetened beverages. -He still admits to dietary indiscretions, including consumption of sweets and sweetened beverages.  -  Suggestion is made for  him to avoid simple carbohydrates  from his diet including Cakes, Sweet Desserts / Pastries, Ice Cream, Soda (diet and regular), Sweet Tea, Candies, Chips, Cookies, Store Bought Juices, Alcohol in Excess of  1-2 drinks a day, Artificial Sweeteners, and "Sugar-free" Products. This will help patient to have stable blood glucose profile and potentially avoid unintended weight gain.  - I encouraged him to  switch to  unprocessed or minimally processed complex starch and increased protein intake (animal or plant source), fruits, and vegetables.  - he is advised to stick to a routine mealtimes to eat 3 meals  a day and avoid unnecessary snacks ( to snack only to correct hypoglycemia).   - he has been  scheduled with Jearld Fenton, RDN, CDE for individualized diabetes education.  - I have approached him with the following individualized plan to manage diabetes and patient agrees:   -Based on his current glycemic burden, he still has some room on his basal insulin before considering prandial insulin.   -He is advised to increase his Lantus to 80 units nightly,  continue to monitor blood glucose 2 times a day-before breakfast and at bedtime.   - he is encouraged to call clinic for blood glucose levels less than 70 or above 300 mg /dl. - He is advised to continue metformin 500 mg ER twice daily after breakfast and supper, therapeutically suitable for patient .  - he is not a candidate for SGLT2 inhibitors due to recent amputation as a complication of peripheral arterial disease and peripheral neuropathy.  - he will be considered for incretin therapy as appropriate next visit. - Patient specific target  A1c;  LDL, HDL, Triglycerides, and  Waist Circumference were discussed in detail.  2) BP/HTN:  his blood pressure is not controlled to target.     he is advised to continue his current medications including amlodipine 10 mg p.o. daily with breakfast as well as metoprolol 25 mg p.o. Daily.  3) Lipids/HPL:  His recent labs showed controlled LDL of 68.  Continue to benefit from statin therapy.  He is advised to continue Crestor 5 mg p.o. Nightly   Side effects and precautions discussed with him.   4)  Weight/Diet:  Body mass index is 33.2 kg/m.  - clearly complicating his diabetes care. CDE Consult will be initiated . Exercise, and detailed carbohydrates information provided  -  detailed on discharge instructions.  5) Chronic Care/Health Maintenance:  -he  Is on Statin medications and  is encouraged to initiate and continue to follow up with Ophthalmology, Dentist,  Podiatrist at least yearly or according to recommendations, and advised to  stay away from smoking. I have recommended yearly flu vaccine and pneumonia vaccine at least every 5 years; he is advised on exercise of upper body plus stationary devices due to his limitation from left BKA,  and  sleep for at least 7 hours a day.  - I advised patient to maintain close follow up with Vidal Schwalbe, MD for primary care needs.  - Time spent with the patient: 25 min, of which >50% was spent in reviewing his blood glucose logs , discussing his hypo- and hyper-glycemic episodes, reviewing his current and  previous labs and insulin doses and developing a plan to avoid hypo- and hyper-glycemia. Please refer to Patient Instructions for Blood Glucose Monitoring and Insulin/Medications Dosing Guide"  in media tab for additional information. Skylar Laprade participated in the discussions, expressed understanding, and voiced agreement with the above plans.  All questions were answered to his satisfaction. he is encouraged to contact clinic should he have any questions or concerns prior to his return visit.  Follow up plan: - Return in about 3 months (around 05/09/2018) for Follow up with Pre-visit Labs, Meter, and Logs.  Glade Lloyd, MD [facility] [address] [place], [place] [address] Phone:  [phone]  Fax:  [phone]    02/06/2018, 1:12 PM  This note was partially dictated with voice recognition software. Similar sounding words can be transcribed inadequately or may not  be corrected upon review.

## 2018-02-06 NOTE — Progress Notes (Signed)
  Medical Nutrition Therapy:  Appt start time: 1115end time:  5956  Assessment:  Primary concerns today: Diabetes Typd 2.  He lives alone. Prepaars hiis own meals. PMH: Alcohol abuse, HTN and Hyperlipidemia.Here with his son . Waking with a walker with his new prosthesis. To see  Dr Dorris Fetch today. Increased  insulin from 60 units to 80 units today.   FBS 300's in am and Forgets his insulin at night a few times a week. Assisted him on setting alarm on his phone as a reminder. His son helps remind him. Now has bubble packs for medications to assist with consistency of taking meds.   Eating fruit as snacks between meals. Cooks for himself and not eating well balanced meals. Likes foods fried with fat back and eats processed meats. His son is helping buy healthier foods for him to eat. He is trying to eat more lower carb vegetables when he has them.  Willing to work on eating habits to improve health and his DM.   Lab Results  Component Value Date   HGBA1C 11.2 (H) 01/31/2018     Preferred Learning Style:   Auditory  Visual  Hands on  Learning Readiness:   Ready  Change in progress   MEDICATIONS:   DIETARY INTAKE:    24-hr recall:  B ( AM), cherrios 2 cups  2% milk,   Snk ( AM):   L ( PM):2 cups black eyed peas or bean and water, green beans or greensSnk ( PM):  D ( PM): Meat and starchy vegetables.water Snk ( PM):  Beverages: water  Usual physical activity: ADL   Estimated energy needs: 1800  calories 200 g carbohydrates 135 g protein 50 g fat  Progress Towards Goal(s):  In progress.   Nutritional Diagnosis:  NB-1.1 Food and nutrition-related knowledge deficit As related to Diabetes Type 2.  As evidenced by A1C 11.4%.    Intervention:  Nutrition and Diabetes education provided on My Plate, CHO counting, meal planning, portion sizes, timing of meals, avoiding snacks between meals unless having a low blood sugar, target ranges for A1C and blood sugars, signs/symptoms  and treatment of hyper/hypoglycemia, monitoring blood sugars, taking medications as prescribed, benefits of exercising 30 minutes per day and prevention of complications of DM. Marland Kitchen Goals  Take insulin at night-80 units at 730 pm. Increase more lower carb veggies with meals. Cut back on fat back and high fat foods Test blood sugars twice a day; before breakfast and bedtime. . Teaching Method Utilized:  Visual Auditory Hands on  Handouts given during visit include:  The Plate Method   Meal Plan Card  Diabetes Instructions.  Barriers to learning/adherence to lifestyle change: None  Demonstrated degree of understanding via:  Teach Back   Monitoring/Evaluation:  Dietary intake, exercise, meal planning, and body weight in 3 month(s). Suggested he make an appt with dentist and eye dr.

## 2018-02-06 NOTE — Patient Instructions (Addendum)
Goals  Take insulin at night-80 units at 730 pm. Increase more lower carb veggies with meals. Cut back on fat back and high fat foods Test blood sugars twice a day; before breakfast and bedtime.

## 2018-02-15 ENCOUNTER — Encounter: Payer: Self-pay | Admitting: Physical Medicine & Rehabilitation

## 2018-02-15 ENCOUNTER — Encounter: Payer: Medicaid Other | Attending: Registered Nurse | Admitting: Physical Medicine & Rehabilitation

## 2018-02-15 ENCOUNTER — Other Ambulatory Visit: Payer: Self-pay

## 2018-02-15 VITALS — BP 126/77 | HR 69 | Ht 67.0 in | Wt 217.0 lb

## 2018-02-15 DIAGNOSIS — Z794 Long term (current) use of insulin: Secondary | ICD-10-CM | POA: Diagnosis not present

## 2018-02-15 DIAGNOSIS — M109 Gout, unspecified: Secondary | ICD-10-CM | POA: Diagnosis not present

## 2018-02-15 DIAGNOSIS — Z89512 Acquired absence of left leg below knee: Secondary | ICD-10-CM

## 2018-02-15 DIAGNOSIS — R269 Unspecified abnormalities of gait and mobility: Secondary | ICD-10-CM

## 2018-02-15 DIAGNOSIS — E785 Hyperlipidemia, unspecified: Secondary | ICD-10-CM | POA: Insufficient documentation

## 2018-02-15 DIAGNOSIS — Z87891 Personal history of nicotine dependence: Secondary | ICD-10-CM | POA: Diagnosis not present

## 2018-02-15 DIAGNOSIS — G4700 Insomnia, unspecified: Secondary | ICD-10-CM | POA: Diagnosis not present

## 2018-02-15 DIAGNOSIS — E119 Type 2 diabetes mellitus without complications: Secondary | ICD-10-CM | POA: Diagnosis not present

## 2018-02-15 DIAGNOSIS — S88112A Complete traumatic amputation at level between knee and ankle, left lower leg, initial encounter: Secondary | ICD-10-CM | POA: Diagnosis not present

## 2018-02-15 DIAGNOSIS — I10 Essential (primary) hypertension: Secondary | ICD-10-CM | POA: Diagnosis not present

## 2018-02-15 DIAGNOSIS — M25561 Pain in right knee: Secondary | ICD-10-CM | POA: Diagnosis not present
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
## 2018-02-19 ENCOUNTER — Other Ambulatory Visit: Payer: Self-pay

## 2018-02-19 ENCOUNTER — Encounter (HOSPITAL_COMMUNITY): Payer: Self-pay

## 2018-02-19 ENCOUNTER — Ambulatory Visit (HOSPITAL_COMMUNITY): Payer: Medicaid Other

## 2018-02-19 DIAGNOSIS — M6281 Muscle weakness (generalized): Secondary | ICD-10-CM | POA: Insufficient documentation

## 2018-02-19 DIAGNOSIS — R262 Difficulty in walking, not elsewhere classified: Secondary | ICD-10-CM | POA: Insufficient documentation

## 2018-02-19 DIAGNOSIS — R2689 Other abnormalities of gait and mobility: Secondary | ICD-10-CM | POA: Insufficient documentation

## 2018-02-19 DIAGNOSIS — R2681 Unsteadiness on feet: Secondary | ICD-10-CM | POA: Insufficient documentation

## 2018-02-19 NOTE — Therapy (Signed)
Patient had arrived without his referral so the evaluation was rescheduled by front office staff for this Friday with another therapist.   Floria Raveling. Hartnett-Rands, MS, PT [facility] [place] [phone] 02/19/2018

## 2018-02-20 ENCOUNTER — Encounter: Payer: Self-pay | Admitting: Internal Medicine

## 2018-02-21 ENCOUNTER — Other Ambulatory Visit (INDEPENDENT_AMBULATORY_CARE_PROVIDER_SITE_OTHER): Payer: Self-pay

## 2018-02-21 ENCOUNTER — Telehealth (INDEPENDENT_AMBULATORY_CARE_PROVIDER_SITE_OTHER): Payer: Self-pay | Admitting: Specialist

## 2018-02-21 NOTE — Telephone Encounter (Signed)
This is a Pharmacologist patient

## 2018-02-21 NOTE — Telephone Encounter (Signed)
Mandy from [facility] called to get written order. Patient is coming tomorrow for appt.  [phone] Attn:Mandy

## 2018-02-21 NOTE — Telephone Encounter (Signed)
Faxed order for Left BKA physical therapy for prosthetic gait training.

## 2018-02-22 ENCOUNTER — Ambulatory Visit (HOSPITAL_COMMUNITY): Payer: Medicaid Other

## 2018-02-22 ENCOUNTER — Encounter (HOSPITAL_COMMUNITY): Payer: Self-pay

## 2018-02-25 ENCOUNTER — Ambulatory Visit (HOSPITAL_COMMUNITY): Payer: Medicaid Other | Attending: Emergency Medicine | Admitting: Physical Therapy

## 2018-02-25 ENCOUNTER — Encounter (HOSPITAL_COMMUNITY): Payer: Self-pay | Admitting: Physical Therapy

## 2018-02-25 ENCOUNTER — Other Ambulatory Visit: Payer: Self-pay

## 2018-02-25 DIAGNOSIS — R262 Difficulty in walking, not elsewhere classified: Secondary | ICD-10-CM | POA: Diagnosis not present

## 2018-02-25 DIAGNOSIS — M6281 Muscle weakness (generalized): Secondary | ICD-10-CM | POA: Diagnosis present

## 2018-02-25 DIAGNOSIS — R2689 Other abnormalities of gait and mobility: Secondary | ICD-10-CM | POA: Diagnosis present

## 2018-02-25 DIAGNOSIS — R2681 Unsteadiness on feet: Secondary | ICD-10-CM | POA: Diagnosis present
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
## 2018-03-05 ENCOUNTER — Encounter (HOSPITAL_COMMUNITY): Payer: Self-pay

## 2018-03-05 ENCOUNTER — Ambulatory Visit (HOSPITAL_COMMUNITY): Payer: Medicaid Other | Attending: Emergency Medicine

## 2018-03-05 DIAGNOSIS — R2689 Other abnormalities of gait and mobility: Secondary | ICD-10-CM

## 2018-03-05 DIAGNOSIS — M6281 Muscle weakness (generalized): Secondary | ICD-10-CM | POA: Insufficient documentation

## 2018-03-05 DIAGNOSIS — R262 Difficulty in walking, not elsewhere classified: Secondary | ICD-10-CM | POA: Insufficient documentation

## 2018-03-05 DIAGNOSIS — S88112A Complete traumatic amputation at level between knee and ankle, left lower leg, initial encounter: Secondary | ICD-10-CM | POA: Insufficient documentation

## 2018-03-05 DIAGNOSIS — R2681 Unsteadiness on feet: Secondary | ICD-10-CM | POA: Diagnosis present

## 2018-03-05 NOTE — Patient Instructions (Signed)
Abduction: Side Leg Lift (Eccentric) - Side-Lying    Lie on side. Lift top leg slightly higher than shoulder level. Keep top leg straight with body, toes pointing forward. Slowly lower for 3-5 seconds. 10 reps per set, 1-2 sets per day, at least 4 days per week.  http://ecce.exer.us/62   Copyright  VHI. All rights reserved.  Straight Leg Raise (Prone)    Abdomen and head supported, keep left knee locked and raise leg at hip. Avoid arching low back. Repeat 10 times per set. Do 1-2 sets per session. Do at least 4 sessions per week.  http://orth.exer.us/1112   Copyright  VHI. All rights reserved.   HIP: Flexors - Supine    Lie on edge of surface. Place leg off the surface, allow knee to bend. Bring other knee toward chest. Hold 30 seconds. 3 reps per set, 2 sets per day, 5 days per week Rest lowered foot on stool.  Copyright  VHI. All rights reserved.
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
## 2018-03-07 ENCOUNTER — Ambulatory Visit (HOSPITAL_COMMUNITY): Payer: Medicaid Other

## 2018-03-07 ENCOUNTER — Encounter (HOSPITAL_COMMUNITY): Payer: Self-pay

## 2018-03-07 DIAGNOSIS — R2689 Other abnormalities of gait and mobility: Secondary | ICD-10-CM | POA: Diagnosis not present

## 2018-03-07 DIAGNOSIS — M6281 Muscle weakness (generalized): Secondary | ICD-10-CM

## 2018-03-07 DIAGNOSIS — R2681 Unsteadiness on feet: Secondary | ICD-10-CM

## 2018-03-07 NOTE — Therapy (Signed)
[facility], [place], [address] Phone: [phone]   Fax:  [phone]  Physical Therapy Treatment  Patient Details  Name: [name] MRN: [id number] Date of Birth: [birth year] Referring Provider (PT): Meridee Score, MD   Encounter Date: 03/07/2018  PT End of Session - 03/07/18 1139    Visit Number  3    Number of Visits  13    Date for PT Re-Evaluation  04/08/18   Minireassess 03/18/18   Authorization Type  Medicaid    Authorization Time Period  3 visits approved 12/02-->03/17/18    Authorization - Visit Number  2    Authorization - Number of Visits  3    PT Start Time  0947   pt late for apt   PT Stop Time  1205    PT Time Calculation (min)  39 min    Equipment Utilized During Treatment  Gait belt;Other (comment)   Lt LE prosthesis   Activity Tolerance  Patient tolerated treatment well;No increased pain    Behavior During Therapy  WFL for tasks assessed/performed       Past Medical History:  Diagnosis Date  . Diabetes mellitus without complication ([place])   . Gout   . Hyperlipidemia   . Hypertension   . Insomnia     Past Surgical History:  Procedure Laterality Date  . AMPUTATION Left 10/05/2017   Procedure: LEFT BELOW KNEE AMPUTATION;  Surgeon: [name], MD;  Location: [facility];  Service: Orthopedics;  Laterality: Left;  . JOINT REPLACEMENT     right knee  . LEG SURGERY      There were no vitals filed for this visit.  Subjective Assessment - 03/07/18 1133    Subjective  Pt stated he is feeling good today, no reports of pain.  Reports he examined leg following last session, no signs of irritation.  Has not began HEP yet    Patient Stated Goals  Patient would like to walk without the walker    Currently in Pain?  No/denies                       [facility] Adult PT Treatment/Exercise - 03/07/18 0001      Ambulation/Gait   Ambulation/Gait  Yes    Ambulation Distance (Feet)   226 Feet    Assistive device  Rolling walker    Gait Pattern  Step-through pattern;Decreased hip/knee flexion - left;Decreased stance time - left;Decreased stride length    Ambulation Surface  Level;Indoor    Gait Comments  Educated on benefits of higher RW to improve posture and reduce UE pressure      Exercises   Exercises  Knee/Hip      Knee/Hip Exercises: Stretches   Hip Flexor Stretch  Left;3 reps;30 seconds    Hip Flexor Stretch Limitations  supine      Knee/Hip Exercises: Standing   Functional Squat  10 reps;3 seconds    Functional Squat Limitations  front of mat    Gait Training  247ft with RW, cueing for equal stance phase.    Other Standing Knee Exercises  sidestep 2RT front of mat with RW    Other Standing Knee Exercises  partial tandem stance 2x 30" with intermittent HHA      Knee/Hip Exercises: Seated   Sit to Sand  10 reps;without UE support   split stance due to Lt knee mobility     Knee/Hip Exercises: Sidelying  Hip ABduction  15 reps      Knee/Hip Exercises: Prone   Hamstring Curl  15 reps;3 seconds    Hip Extension  Both;15 reps               PT Short Term Goals - 02/25/18 1233      PT SHORT TERM GOAL #1   Title  Patient will report understanding and regular compliance of HEP in order to improve strength, balance, gait and overall functional mobility.     Time  3    Period  Weeks    Status  New    Target Date  03/18/18      PT SHORT TERM GOAL #2   Title  Patient will demonstrate improvement of 1/2 MMT strength grade in all muscle groups tested as deficient in order to improve gait mechanics and ability to navigate stairs.     Time  3    Period  Weeks    Status  New    Target Date  03/18/18      PT SHORT TERM GOAL #3   Title  Patient will demonstrate ability to maintain single limb stance for at least 3 seconds on each lower extremity indicating improved safety and ease of navigating stairs.     Time  3    Period  Weeks    Status  New     Target Date  03/18/18        PT Long Term Goals - 02/25/18 1235      PT LONG TERM GOAL #1   Title  Patient will demonstrate ability to maintain single limb stance for at least 10 seconds on each lower extremity indicating improved safety, confidence, and ease of navigating stairs.     Time  6    Period  Weeks    Status  New    Target Date  04/08/18      PT LONG TERM GOAL #2   Title  Patient will demonstrate improvement of at least 10% on the ABC scale indicating improved percieved confidence and safety in daily activities which require balance.     Time  6    Period  Weeks    Status  New    Target Date  04/08/18      PT LONG TERM GOAL #3   Title  Patient will demonstrate improvement of 1 MMT strength grade in all muscle groups tested as deficient in order to improve gait mechanics and ability to navigate stairs.     Time  6    Period  Weeks    Status  New    Target Date  04/08/18      PT LONG TERM GOAL #4   Title  Patient will demonstrate ability to ambulate at a gait velocity of at least 0.8 m/s on the 2MWT with LRAD indicating improved safety ambulating at home and in the community.     Time  6    Period  Weeks    Status  New    Target Date  04/08/18      PT LONG TERM GOAL #5   Title  Patient will report that ascending and descending 4 6-inch stairs is not more than minimally difficult, indicating improved confidence and safety with stair negotiation.     Time  6    Period  Weeks    Status  New    Target Date  04/08/18  Plan - 03/07/18 1225    Clinical Impression Statement  Reviewed form with HEP wiht good mechanics noted, min cueing for form with supine hip flexor stretch.  Added STS, minisquats and sidestep for gluteal strengthening.  Intermittent HHA required with partial tandem stance LOB episodes.  No reports of pain through session, was limited by fatigue with activities.    Rehab Potential  Fair    Clinical Impairments Affecting Rehab Potential   Positive: Motivated; Negative: comorbidities    PT Frequency  2x / week    PT Duration  6 weeks    PT Treatment/Interventions  ADLs/Self Care Home Management;Aquatic Therapy;Electrical Stimulation;DME Instruction;Gait training;Stair training;Functional mobility training;Therapeutic activities;Therapeutic exercise;Balance training;Neuromuscular re-education;Patient/family education;Orthotic Fit/Training;Prosthetic Training;Wheelchair mobility training;Manual techniques;Manual lymph drainage;Compression bandaging;Scar mobilization;Passive range of motion;Dry needling;Energy conservation;Taping    PT Next Visit Plan  Review compliance wiht HEP.  Progress hip strengthening.  Begin standing abd/ext and continue balance training.  Add step up training next session.  Begin gait training with goal to progress to without AD as able.      PT Home Exercise Plan  03/05/2018: hip abd and extension; supine hip flexor strech.       Patient will benefit from skilled therapeutic intervention in order to improve the following deficits and impairments:  Abnormal gait, Decreased balance, Decreased endurance, Difficulty walking, Impaired sensation, Improper body mechanics, Decreased activity tolerance, Decreased strength  Visit Diagnosis: Other abnormalities of gait and mobility  Muscle weakness (generalized)  Unsteadiness on feet     Problem List Patient Active Problem List   Diagnosis Date Noted  . Vitamin D deficiency 02/06/2018  . Mixed hyperlipidemia 12/12/2017  . S/P BKA (below knee amputation) unilateral, left ([place]) 11/29/2017  . Unilateral complete BKA, right, sequela ([place])   . Thrombocytosis ([place])   . Constipation due to pain medication   . Below knee amputation status, left   . Unilateral complete BKA, left, initial encounter ([place])   . Acute blood loss anemia   . Post-operative pain   . Essential hypertension, benign   . Tobacco abuse   . S/P amputation   . Subacute osteomyelitis, left  ankle and foot ([place])   . Acute osteomyelitis of left calcaneus ([facility])   . Cutaneous abscess of left foot   . Severe protein-calorie malnutrition ([place])   . PAD (peripheral artery disease) ([place])   . Wound infection 10/02/2017  . Type 2 diabetes mellitus treated with insulin ([place])   . Tobacco use disorder   . Lymphedema 05/28/2017  . Essential hypertension 05/28/2017  . DM type 2 causing vascular disease ([place]) 05/28/2017  . DJD (degenerative joint disease) 05/28/2017  . Hyponatremia 04/05/2016  . Anemia 04/05/2016  . Gout 04/05/2016  . Bilateral foot pain 04/04/2016   Ihor Austin, LPTA; [place]  Aldona Lento 03/07/2018, 12:30 PM  [facility], [place], [address] Phone: [phone]   Fax:  [phone]  Name: [name] MRN: [id number] Date of Birth: 20-Sep-1960

## 2018-03-12 ENCOUNTER — Ambulatory Visit (HOSPITAL_COMMUNITY): Payer: Medicaid Other

## 2018-03-12 ENCOUNTER — Encounter (HOSPITAL_COMMUNITY): Payer: Self-pay

## 2018-03-12 ENCOUNTER — Other Ambulatory Visit: Payer: Self-pay

## 2018-03-12 DIAGNOSIS — M6281 Muscle weakness (generalized): Secondary | ICD-10-CM

## 2018-03-12 DIAGNOSIS — S88112A Complete traumatic amputation at level between knee and ankle, left lower leg, initial encounter: Secondary | ICD-10-CM

## 2018-03-12 DIAGNOSIS — R2689 Other abnormalities of gait and mobility: Secondary | ICD-10-CM

## 2018-03-12 DIAGNOSIS — R2681 Unsteadiness on feet: Secondary | ICD-10-CM

## 2018-03-12 DIAGNOSIS — R262 Difficulty in walking, not elsewhere classified: Secondary | ICD-10-CM
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
## 2018-03-14 ENCOUNTER — Ambulatory Visit (HOSPITAL_COMMUNITY): Payer: Medicaid Other

## 2018-03-18 ENCOUNTER — Ambulatory Visit (HOSPITAL_COMMUNITY): Payer: Medicaid Other | Admitting: Physical Therapy

## 2018-03-18 ENCOUNTER — Encounter (HOSPITAL_COMMUNITY): Payer: Self-pay | Admitting: Physical Therapy

## 2018-03-18 DIAGNOSIS — R2689 Other abnormalities of gait and mobility: Secondary | ICD-10-CM | POA: Diagnosis not present

## 2018-03-18 DIAGNOSIS — R2681 Unsteadiness on feet: Secondary | ICD-10-CM

## 2018-03-18 DIAGNOSIS — M6281 Muscle weakness (generalized): Secondary | ICD-10-CM
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
## 2018-03-21 ENCOUNTER — Ambulatory Visit (HOSPITAL_COMMUNITY): Payer: Medicaid Other | Admitting: Physical Therapy

## 2018-03-21 ENCOUNTER — Encounter (HOSPITAL_COMMUNITY): Payer: Self-pay | Admitting: Physical Therapy

## 2018-03-21 DIAGNOSIS — R2689 Other abnormalities of gait and mobility: Secondary | ICD-10-CM

## 2018-03-21 DIAGNOSIS — R2681 Unsteadiness on feet: Secondary | ICD-10-CM

## 2018-03-21 DIAGNOSIS — M6281 Muscle weakness (generalized): Secondary | ICD-10-CM
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
## 2018-03-26 ENCOUNTER — Ambulatory Visit (HOSPITAL_COMMUNITY): Payer: Medicaid Other

## 2018-03-26 ENCOUNTER — Encounter (HOSPITAL_COMMUNITY): Payer: Self-pay

## 2018-03-26 DIAGNOSIS — R2681 Unsteadiness on feet: Secondary | ICD-10-CM

## 2018-03-26 DIAGNOSIS — R2689 Other abnormalities of gait and mobility: Secondary | ICD-10-CM

## 2018-03-26 DIAGNOSIS — M6281 Muscle weakness (generalized): Secondary | ICD-10-CM
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
## 2018-03-28 ENCOUNTER — Ambulatory Visit (HOSPITAL_COMMUNITY): Payer: Medicaid Other

## 2018-03-28 ENCOUNTER — Telehealth (HOSPITAL_COMMUNITY): Payer: Self-pay

## 2018-03-28 ENCOUNTER — Ambulatory Visit (HOSPITAL_COMMUNITY): Payer: Medicaid Other | Admitting: Physical Therapy

## 2018-03-28 NOTE — Telephone Encounter (Signed)
Patient's son called and canceled his appt

## 2018-03-29 ENCOUNTER — Ambulatory Visit (HOSPITAL_COMMUNITY): Payer: Medicaid Other | Admitting: Physical Therapy

## 2018-03-29 ENCOUNTER — Telehealth (HOSPITAL_COMMUNITY): Payer: Self-pay | Admitting: Physical Therapy

## 2018-03-29 ENCOUNTER — Encounter (HOSPITAL_COMMUNITY): Payer: Self-pay | Admitting: Physical Therapy

## 2018-03-29 DIAGNOSIS — R2689 Other abnormalities of gait and mobility: Secondary | ICD-10-CM | POA: Diagnosis not present

## 2018-03-29 DIAGNOSIS — R2681 Unsteadiness on feet: Secondary | ICD-10-CM

## 2018-03-29 DIAGNOSIS — R262 Difficulty in walking, not elsewhere classified: Secondary | ICD-10-CM

## 2018-03-29 DIAGNOSIS — M6281 Muscle weakness (generalized): Secondary | ICD-10-CM

## 2018-03-29 DIAGNOSIS — S88112A Complete traumatic amputation at level between knee and ankle, left lower leg, initial encounter: Secondary | ICD-10-CM

## 2018-03-29 NOTE — Telephone Encounter (Signed)
Need to wait for approval
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
## 2018-04-02 ENCOUNTER — Encounter (HOSPITAL_COMMUNITY): Payer: Medicaid Other

## 2018-04-05 ENCOUNTER — Ambulatory Visit (HOSPITAL_COMMUNITY): Payer: Medicaid Other | Attending: Emergency Medicine | Admitting: Physical Therapy

## 2018-04-05 ENCOUNTER — Encounter (HOSPITAL_COMMUNITY): Payer: Self-pay | Admitting: Physical Therapy

## 2018-04-05 DIAGNOSIS — R262 Difficulty in walking, not elsewhere classified: Secondary | ICD-10-CM | POA: Diagnosis present

## 2018-04-05 DIAGNOSIS — M6281 Muscle weakness (generalized): Secondary | ICD-10-CM | POA: Diagnosis present

## 2018-04-05 DIAGNOSIS — R2681 Unsteadiness on feet: Secondary | ICD-10-CM | POA: Diagnosis present

## 2018-04-05 DIAGNOSIS — R2689 Other abnormalities of gait and mobility: Secondary | ICD-10-CM | POA: Insufficient documentation

## 2018-04-05 DIAGNOSIS — S88112A Complete traumatic amputation at level between knee and ankle, left lower leg, initial encounter: Secondary | ICD-10-CM | POA: Insufficient documentation
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
## 2018-04-09 ENCOUNTER — Encounter (HOSPITAL_COMMUNITY): Payer: Self-pay | Admitting: Physical Therapy

## 2018-04-09 ENCOUNTER — Ambulatory Visit (HOSPITAL_COMMUNITY): Payer: Medicaid Other | Admitting: Physical Therapy

## 2018-04-09 DIAGNOSIS — R2681 Unsteadiness on feet: Secondary | ICD-10-CM

## 2018-04-09 DIAGNOSIS — R2689 Other abnormalities of gait and mobility: Secondary | ICD-10-CM

## 2018-04-09 DIAGNOSIS — M6281 Muscle weakness (generalized): Secondary | ICD-10-CM

## 2018-04-09 NOTE — Addendum Note (Signed)
Addended by: Clarene Critchley on: 04/09/2018 07:58 AM   Modules accepted: Orders
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
## 2018-04-11 ENCOUNTER — Ambulatory Visit (HOSPITAL_COMMUNITY): Payer: Medicaid Other | Admitting: Physical Therapy

## 2018-04-13 ENCOUNTER — Other Ambulatory Visit: Payer: Self-pay | Admitting: "Endocrinology

## 2018-04-17 ENCOUNTER — Encounter (HOSPITAL_COMMUNITY): Payer: Self-pay | Admitting: Physical Therapy

## 2018-04-17 ENCOUNTER — Ambulatory Visit (HOSPITAL_COMMUNITY): Payer: Medicaid Other | Admitting: Physical Therapy

## 2018-04-17 DIAGNOSIS — R2681 Unsteadiness on feet: Secondary | ICD-10-CM

## 2018-04-17 DIAGNOSIS — M6281 Muscle weakness (generalized): Secondary | ICD-10-CM

## 2018-04-17 DIAGNOSIS — R2689 Other abnormalities of gait and mobility: Secondary | ICD-10-CM
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
## 2018-04-23 ENCOUNTER — Encounter (HOSPITAL_COMMUNITY): Payer: Self-pay

## 2018-04-23 ENCOUNTER — Ambulatory Visit (HOSPITAL_COMMUNITY): Payer: Medicaid Other

## 2018-04-23 DIAGNOSIS — R2689 Other abnormalities of gait and mobility: Secondary | ICD-10-CM | POA: Diagnosis not present

## 2018-04-23 DIAGNOSIS — M6281 Muscle weakness (generalized): Secondary | ICD-10-CM

## 2018-04-23 DIAGNOSIS — R2681 Unsteadiness on feet: Secondary | ICD-10-CM

## 2018-04-23 DIAGNOSIS — S88112A Complete traumatic amputation at level between knee and ankle, left lower leg, initial encounter: Secondary | ICD-10-CM

## 2018-04-23 DIAGNOSIS — R262 Difficulty in walking, not elsewhere classified: Secondary | ICD-10-CM
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
## 2018-04-25 ENCOUNTER — Ambulatory Visit (HOSPITAL_COMMUNITY): Payer: Medicaid Other

## 2018-04-25 ENCOUNTER — Encounter (HOSPITAL_COMMUNITY): Payer: Self-pay

## 2018-04-25 ENCOUNTER — Other Ambulatory Visit: Payer: Self-pay

## 2018-04-25 DIAGNOSIS — M6281 Muscle weakness (generalized): Secondary | ICD-10-CM

## 2018-04-25 DIAGNOSIS — R2689 Other abnormalities of gait and mobility: Secondary | ICD-10-CM | POA: Diagnosis not present

## 2018-04-25 DIAGNOSIS — S88112A Complete traumatic amputation at level between knee and ankle, left lower leg, initial encounter: Secondary | ICD-10-CM

## 2018-04-25 DIAGNOSIS — R2681 Unsteadiness on feet: Secondary | ICD-10-CM

## 2018-04-25 DIAGNOSIS — R262 Difficulty in walking, not elsewhere classified: Secondary | ICD-10-CM
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
## 2018-04-30 ENCOUNTER — Encounter (HOSPITAL_COMMUNITY): Payer: Self-pay | Admitting: Physical Therapy

## 2018-04-30 ENCOUNTER — Ambulatory Visit (HOSPITAL_COMMUNITY): Payer: Medicaid Other | Admitting: Physical Therapy

## 2018-04-30 DIAGNOSIS — R2681 Unsteadiness on feet: Secondary | ICD-10-CM

## 2018-04-30 DIAGNOSIS — R2689 Other abnormalities of gait and mobility: Secondary | ICD-10-CM

## 2018-04-30 DIAGNOSIS — M6281 Muscle weakness (generalized): Secondary | ICD-10-CM
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
## 2018-05-02 ENCOUNTER — Telehealth (HOSPITAL_COMMUNITY): Payer: Self-pay | Admitting: Emergency Medicine

## 2018-05-02 ENCOUNTER — Ambulatory Visit (HOSPITAL_COMMUNITY): Payer: Medicaid Other | Admitting: Physical Therapy

## 2018-05-02 NOTE — Telephone Encounter (Signed)
05/02/18  called to cx because of another appt that he has

## 2018-05-03 LAB — COMPLETE METABOLIC PANEL WITH GFR
AG Ratio: 1.2 (calc) (ref 1.0–2.5)
ALBUMIN MSPROF: 4.2 g/dL (ref 3.6–5.1)
ALKALINE PHOSPHATASE (APISO): 63 U/L (ref 40–115)
ALT: 16 U/L (ref 9–46)
AST: 11 U/L (ref 10–35)
BUN: 19 mg/dL (ref 7–25)
CHLORIDE: 103 mmol/L (ref 98–110)
CO2: 25 mmol/L (ref 20–32)
CREATININE: 0.83 mg/dL (ref 0.70–1.33)
Calcium: 9.8 mg/dL (ref 8.6–10.3)
GFR, Est African American: 112 mL/min/{1.73_m2} (ref >=60)
GFR, Est Non African American: 96 mL/min/{1.73_m2} (ref >=60)
GLOBULIN: 3.4 g/dL (ref 1.9–3.7)
Glucose, Bld: 271 mg/dL — ABNORMAL HIGH (ref 65–99)
Potassium: 4.4 mmol/L (ref 3.5–5.3)
Sodium: 138 mmol/L (ref 135–146)
TOTAL PROTEIN: 7.6 g/dL (ref 6.1–8.1)
Total Bilirubin: 0.2 mg/dL (ref 0.2–1.2)

## 2018-05-03 LAB — HEMOGLOBIN A1C
HEMOGLOBIN A1C: 12.8 %{Hb} — AB (ref <5.7)
Mean Plasma Glucose: 321 (calc)
eAG (mmol/L): 17.8 (calc)

## 2018-05-07 ENCOUNTER — Encounter (HOSPITAL_COMMUNITY): Payer: Medicaid Other | Admitting: Physical Therapy

## 2018-05-08 ENCOUNTER — Ambulatory Visit (HOSPITAL_COMMUNITY): Payer: Medicaid Other | Attending: Emergency Medicine | Admitting: Physical Therapy

## 2018-05-08 ENCOUNTER — Encounter (HOSPITAL_COMMUNITY): Payer: Self-pay | Admitting: Physical Therapy

## 2018-05-08 DIAGNOSIS — R2681 Unsteadiness on feet: Secondary | ICD-10-CM

## 2018-05-08 DIAGNOSIS — R2689 Other abnormalities of gait and mobility: Secondary | ICD-10-CM

## 2018-05-08 DIAGNOSIS — M6281 Muscle weakness (generalized): Secondary | ICD-10-CM | POA: Insufficient documentation
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
## 2018-05-09 ENCOUNTER — Encounter: Payer: Self-pay | Admitting: "Endocrinology

## 2018-05-09 ENCOUNTER — Ambulatory Visit (INDEPENDENT_AMBULATORY_CARE_PROVIDER_SITE_OTHER): Payer: Medicaid Other | Admitting: "Endocrinology

## 2018-05-09 VITALS — BP 144/87 | HR 73 | Ht 67.0 in | Wt 223.0 lb

## 2018-05-09 DIAGNOSIS — E1159 Type 2 diabetes mellitus with other circulatory complications: Secondary | ICD-10-CM

## 2018-05-09 DIAGNOSIS — E782 Mixed hyperlipidemia: Secondary | ICD-10-CM

## 2018-05-09 DIAGNOSIS — I10 Essential (primary) hypertension: Secondary | ICD-10-CM | POA: Diagnosis not present

## 2018-05-09 DIAGNOSIS — E559 Vitamin D deficiency, unspecified: Secondary | ICD-10-CM | POA: Diagnosis not present

## 2018-05-09 MED ORDER — INSULIN GLARGINE 100 UNIT/ML ~~LOC~~ SOLN: 80.0000 [IU] | Freq: Every day | SUBCUTANEOUS | 1 refills | Status: DC

## 2018-05-09 NOTE — Patient Instructions (Signed)

## 2018-05-09 NOTE — Progress Notes (Signed)
Endocrinology follow-up  Note       05/09/2018, 11:16 AM   Subjective:    Patient ID: [name], male    DOB: [birth year].  [name] is being seen in follow-up for management of currently uncontrolled complicated symptomatic  Type 2 diabetes, hyperlipidemia, hypertension. PMD:   Vidal Schwalbe, MD.   Past Medical History:  Diagnosis Date  . Diabetes mellitus without complication ([place])   . Gout   . Hyperlipidemia   . Hypertension   . Insomnia    Past Surgical History:  Procedure Laterality Date  . AMPUTATION Left 10/05/2017   Procedure: LEFT BELOW KNEE AMPUTATION;  Surgeon: [name], MD;  Location: [facility];  Service: Orthopedics;  Laterality: Left;  . JOINT REPLACEMENT     right knee  . LEG SURGERY     Social History   Socioeconomic History  . Marital status: Single    Spouse name: Not on file  . Number of children: Not on file  . Years of education: Not on file  . Highest education level: Not on file  Occupational History  . Not on file  Social Needs  . Financial resource strain: Not on file  . Food insecurity:    Worry: Not on file    Inability: Not on file  . Transportation needs:    Medical: Not on file    Non-medical: Not on file  Tobacco Use  . Smoking status: Former Smoker    Packs/day: 1.00    Years: 50.00    Pack years: 50.00    Types: Cigarettes  . Smokeless tobacco: Never Used  Substance and Sexual Activity  . Alcohol use: Not Currently  . Drug use: No  . Sexual activity: Not on file  Lifestyle  . Physical activity:    Days per week: Not on file    Minutes per session: Not on file  . Stress: Not on file  Relationships  . Social connections:    Talks on phone: Not on file    Gets together: Not on file    Attends religious service: Not on file    Active member of club or organization: Not on file    Attends meetings of clubs or organizations:  Not on file    Relationship status: Not on file  Other Topics Concern  . Not on file  Social History Narrative  . Not on file   Outpatient Encounter Medications as of 05/09/2018  Medication Sig  . Insulin Aspart (NOVOLOG FLEXPEN [place]) Inject 10 Units into the skin 3 (three) times daily before meals.  Marland Kitchen acetaminophen (TYLENOL) 325 MG tablet Take 2 tablets (650 mg total) by mouth every 6 (six) hours.  Marland Kitchen amitriptyline (ELAVIL) 25 MG tablet Take 25 mg by mouth at bedtime.  Marland Kitchen amLODipine (NORVASC) 10 MG tablet Take 1 tablet (10 mg total) by mouth daily.  Marland Kitchen aspirin 81 MG chewable tablet Chew 81 mg by mouth daily.  . Cholecalciferol (VITAMIN D3) 125 MCG (5000 UT) CAPS TAKE (1) CAPSULE BY MOUTH ONCE DAILY.  Marland Kitchen insulin glargine (LANTUS) 100 UNIT/ML injection Inject 0.5 mLs (50 Units total) into the  skin daily. (Patient taking differently: Inject 80 Units into the skin daily. )  . LANTUS SOLOSTAR 100 UNIT/ML Solostar Pen INJECT 80 UNITS S.Q. AT BEDTIME.  . Melatonin 3 MG TABS Take 1.5 tablets (4.5 mg total) by mouth at bedtime as needed (sleep).  . metFORMIN (GLUCOPHAGE-XR) 500 MG 24 hr tablet TAKE 1 TABLET BY MOUTH TWICE DAILY AFTER A MEAL  . metoprolol succinate (TOPROL-XL) 25 MG 24 hr tablet Take 1 tablet (25 mg total) by mouth daily.  . Multiple Vitamin (MULTIVITAMIN WITH MINERALS) TABS tablet Take 1 tablet by mouth daily.  . pantoprazole (PROTONIX) 40 MG tablet Take 1 tablet (40 mg total) by mouth daily.  . polyethylene glycol (MIRALAX / GLYCOLAX) packet Take 17 g by mouth daily.  . rosuvastatin (CRESTOR) 5 MG tablet Take 5 mg by mouth daily.  . [DISCONTINUED] oxyCODONE (OXY IR/ROXICODONE) 5 MG immediate release tablet Take 1 tablet (5 mg total) by mouth every 8 (eight) hours as needed for severe pain.   No facility-administered encounter medications on file as of 05/09/2018.     ALLERGIES: No Known Allergies  VACCINATION STATUS:  There is no immunization history on file for this  patient.  Diabetes  He presents for his follow-up diabetic visit. He has type 2 diabetes mellitus. Onset time: He was diagnosed at approximate age of 41 years. His disease course has been worsening. There are no hypoglycemic associated symptoms. Pertinent negatives for hypoglycemia include no confusion, headaches, pallor or seizures. Associated symptoms include blurred vision, polydipsia and polyuria. Pertinent negatives for diabetes include no chest pain, no fatigue, no polyphagia and no weakness. There are no hypoglycemic complications. Symptoms are worsening. Diabetic complications include nephropathy and PVD. (Patient is status post left below-knee amputation as a complication of diabetes.) Risk factors for coronary artery disease include diabetes mellitus, dyslipidemia, family history, male sex, obesity, hypertension, sedentary lifestyle and tobacco exposure. Current diabetic treatment includes insulin injections and oral agent (monotherapy) (Is currently on Lantus 28 units daily in the morning and metformin 2000 g daily.). His weight is increasing steadily. He is following a generally unhealthy diet. When asked about meal planning, he reported none. He has not had a previous visit with a dietitian. 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 His breakfast blood glucose range is generally >200 mg/dl. His bedtime blood glucose range is generally >200 mg/dl. His overall blood glucose range is >200 mg/dl. (He returns with persistent severe hyperglycemia, A1c of 12.8%.  He has taken his Lantus at 80 units nightly, also took NovoLog at high doses without orders from [place] at times causing tight blood glucose readings. ) An ACE inhibitor/angiotensin II receptor blocker is not being taken. He does not see a podiatrist.Eye exam is current.  Hyperlipidemia  This is a chronic problem. The current episode started more than 1 year ago. Exacerbating diseases include diabetes and  obesity. Pertinent negatives include no chest pain, myalgias or shortness of breath. Current antihyperlipidemic treatment includes statins. Risk factors for coronary artery disease include dyslipidemia, diabetes mellitus, hypertension, male sex, obesity, a sedentary lifestyle and family history.  Hypertension  This is a chronic problem. The current episode started more than 1 year ago. Associated symptoms include blurred vision. Pertinent negatives include no chest pain, headaches, neck pain, palpitations or shortness of breath. Risk factors for coronary artery disease include dyslipidemia, family history, diabetes mellitus, obesity, male gender, sedentary lifestyle and smoking/tobacco exposure. Past treatments include calcium channel blockers and beta blockers. Hypertensive  end-organ damage includes PVD.     Review of Systems  Constitutional: Negative for chills, fatigue, fever and unexpected weight change.  HENT: Negative for dental problem, mouth sores and trouble swallowing.   Eyes: Positive for blurred vision. Negative for visual disturbance.  Respiratory: Negative for cough, choking, chest tightness, shortness of breath and wheezing.   Cardiovascular: Negative for chest pain, palpitations and leg swelling.  Gastrointestinal: Negative for abdominal distention, abdominal pain, constipation, diarrhea, nausea and vomiting.  Endocrine: Positive for polydipsia and polyuria. Negative for polyphagia.  Genitourinary: Negative for dysuria, flank pain, hematuria and urgency.  Musculoskeletal: Positive for gait problem. Negative for back pain, myalgias and neck pain.       Patient is on a wheelchair due to recent left BKA.  Skin: Negative for pallor, rash and wound.  Neurological: Negative for seizures, syncope, weakness, numbness and headaches.  Psychiatric/Behavioral: Negative.  Negative for confusion and dysphoric mood.    Objective:    BP (!) 144/87   Pulse 73   Ht 5\' 7"  (1.702 m)   Wt 223  lb (101.2 kg)   BMI 34.93 kg/m   Wt Readings from Last 3 Encounters:  05/09/18 223 lb (101.2 kg)  02/15/18 217 lb (98.4 kg)  02/06/18 212 lb (96.2 kg)     Physical Exam Constitutional:      General: He is not in acute distress.    Appearance: He is well-developed.     Comments: Generally poor hygiene.  HENT:     Head: Normocephalic and atraumatic.  Neck:     Musculoskeletal: Normal range of motion and neck supple.     Thyroid: No thyromegaly.     Trachea: No tracheal deviation.  Cardiovascular:     Rate and Rhythm: Normal rate.     Pulses:          Dorsalis pedis pulses are 1+ on the right side and 1+ on the left side.       Posterior tibial pulses are 1+ on the right side and 1+ on the left side.     Heart sounds: Normal heart sounds, S1 normal and S2 normal. No murmur. No gallop.   Pulmonary:     Effort: Pulmonary effort is normal. No respiratory distress.     Breath sounds: No wheezing.  Abdominal:     General: There is no distension.     Tenderness: There is no abdominal tenderness. There is no guarding.  Musculoskeletal:     Right shoulder: He exhibits no swelling and no deformity.     Comments: Patient is a wheelchair-bound due to recent left BKA.  Skin:    General: Skin is warm and dry.     Findings: No rash.     Nails: There is no clubbing.   Neurological:     Mental Status: He is alert and oriented to person, place, and time.     Cranial Nerves: No cranial nerve deficit.     Sensory: No sensory deficit.     Gait: Gait normal.     Deep Tendon Reflexes: Reflexes are normal and symmetric.  Psychiatric:        Speech: Speech normal.        Behavior: Behavior is cooperative.        Judgment: Judgment normal.     Comments: Patient is accompanied by his son .  He has reluctant affect.     Diabetic Labs (most recent): Lab Results  Component Value Date   HGBA1C 12.8 (H)  05/02/2018   HGBA1C 11.2 (H) 01/31/2018   HGBA1C 11.4 (H) 10/02/2017   Recent Results  (from the past 2160 hour(s))  Hemoglobin A1c     Status: Abnormal   Collection Time: 05/02/18 10:09 AM  Result Value Ref Range   Hgb A1c MFr Bld 12.8 (H) <5.7 % of total Hgb    Comment: For someone without known diabetes, a hemoglobin A1c value of 6.5% or greater indicates that they may have  diabetes and this should be confirmed with a follow-up  test. . For someone with known diabetes, a value <7% indicates  that their diabetes is well controlled and a value  greater than or equal to 7% indicates suboptimal  control. A1c targets should be individualized based on  duration of diabetes, age, comorbid conditions, and  other considerations. . Currently, no consensus exists regarding use of hemoglobin A1c for diagnosis of diabetes for children. .    Mean Plasma Glucose 321 (calc)   eAG (mmol/L) 17.8 (calc)  COMPLETE METABOLIC PANEL WITH GFR     Status: Abnormal   Collection Time: 05/02/18 10:09 AM  Result Value Ref Range   Glucose, Bld 271 (H) 65 - 99 mg/dL    Comment: .            Fasting reference interval . For someone without known diabetes, a glucose value >125 mg/dL indicates that they may have diabetes and this should be confirmed with a follow-up test. .    BUN 19 7 - 25 mg/dL   Creat 0.83 0.70 - 1.33 mg/dL    Comment: For patients >61 years of age, the reference limit for Creatinine is approximately 13% higher for people identified as African-American. .    GFR, Est Non African American 96 > OR = 60 mL/min/1.50m2   GFR, Est African American 112 > OR = 60 mL/min/1.61m2   BUN/Creatinine Ratio NOT APPLICABLE 6 - 22 (calc)   Sodium 138 135 - 146 mmol/L   Potassium 4.4 3.5 - 5.3 mmol/L   Chloride 103 98 - 110 mmol/L   CO2 25 20 - 32 mmol/L   Calcium 9.8 8.6 - 10.3 mg/dL   Total Protein 7.6 6.1 - 8.1 g/dL   Albumin 4.2 3.6 - 5.1 g/dL   Globulin 3.4 1.9 - 3.7 g/dL (calc)   AG Ratio 1.2 1.0 - 2.5 (calc)   Total Bilirubin 0.2 0.2 - 1.2 mg/dL   Alkaline phosphatase  (APISO) 63 40 - 115 U/L   AST 11 10 - 35 U/L   ALT 16 9 - 46 U/L     Lipid Panel     Component Value Date/Time   CHOL 134 01/31/2018 0901   TRIG 386 (H) 01/31/2018 0901   HDL 19 (L) 01/31/2018 0901   CHOLHDL 7.1 (H) 01/31/2018 0901   LDLCALC 68 01/31/2018 0901     Assessment & Plan:   1. DM type 2 causing vascular disease ([name]) - [name] has currently uncontrolled symptomatic type 2 DM since 58 years of age. -Presents with persistent hyperglycemia, significantly above target fasting and at bedtime.  His A1c is higher at 12.8% increasing from 11.2%.  -His son is accompanied him to clinic, complains that his father does not follow the dietary recommendations provided.  Recent labs reviewed.  -his diabetes is complicated by peripheral neuropathy, peripheral arterial disease complicated by left BKA and he remains at extremely high risk for more acute and chronic complications which include CAD, CVA, CKD, retinopathy, and neuropathy. These are  all discussed in detail with him.  - I have counseled him on diet management and weight loss, by adopting a carbohydrate restricted/protein rich diet.  - Patient admits there is a room for improvement in his diet and drink choices. -  Suggestion is made for him to avoid simple carbohydrates  from his diet including Cakes, Sweet Desserts / Pastries, Ice Cream, Soda (diet and regular), Sweet Tea, Candies, Chips, Cookies, Store Bought Juices, Alcohol in Excess of  1-2 drinks a day, Artificial Sweeteners, and "Sugar-free" Products. This will help patient to have stable blood glucose profile and potentially avoid unintended weight gain.  - I encouraged him to switch to  unprocessed or minimally processed complex starch and increased protein intake (animal or plant source), fruits, and vegetables.  - he is advised to stick to a routine mealtimes to eat 3 meals  a day and avoid unnecessary snacks ( to snack only to correct hypoglycemia).   -  he has been  scheduled with Jearld Fenton, RDN, CDE for individualized diabetes education.  - I have approached him with the following individualized plan to manage diabetes and patient agrees:   -Based on his current glycemic burden, he will require intensive treatment with basal/bolus insulin.   -Patient himself is likely cognitively challenged, his son and daughter-in-law are offering to help him with execution of this treatment plan.  If his home situation is not adequate for this plan, he should be considered for group home placement. -He already has some NovoLog supplies at home.   -He is advised to continue Lantus 80 units nightly, initiate NovoLog 10 units 3 times daily AC for pre-meal blood glucose readings of 90 mg/dL, associated with strict monitoring of blood glucose 4 times a day- before meals and at bedtime.    - he is encouraged to call clinic for blood glucose levels less than 70 or above 300 mg /dl. - He is advised to continue metformin 500 mg ER twice daily after breakfast and supper, therapeutically suitable for patient .  - he is not a candidate for SGLT2 inhibitors due to recent amputation as a complication of peripheral arterial disease and peripheral neuropathy.  - he will be considered for incretin therapy as appropriate next visit. - Patient specific target  A1c;  LDL, HDL, Triglycerides, and  Waist Circumference were discussed in detail.  2) BP/HTN:  his blood pressure is not controlled to target.    he is advised to continue his current medications including amlodipine 10 mg p.o. daily with breakfast as well as metoprolol 25 mg p.o. Daily.  3) Lipids/HPL: His recent labs showed controlled LDL of 68.  Continue to benefit from statin therapy.  He is advised to continue Crestor 5 mg p.o. Nightly   Side effects and precautions discussed with him.   4)  Weight/Diet:  Body mass index is 34.93 kg/m.  - clearly complicating his diabetes care. CDE Consult will be initiated .  Exercise, and detailed carbohydrates information provided  -  detailed on discharge instructions.  5) Chronic Care/Health Maintenance:  -he  Is on Statin medications and  is encouraged to initiate and continue to follow up with Ophthalmology, Dentist,  Podiatrist at least yearly or according to recommendations, and advised to  stay away from smoking. I have recommended yearly flu vaccine and pneumonia vaccine at least every 5 years; he is advised on exercise of upper body plus stationary devices due to his limitation from left BKA,  and  sleep for at  least 7 hours a day.  - I advised patient to maintain close follow up with Vidal Schwalbe, MD for primary care needs.  - Time spent with the patient: 25 min, of which >50% was spent in reviewing his blood glucose logs , discussing his hypoglycemia and hyperglycemia episodes, reviewing his current and  previous labs / studies and medications  doses and developing a plan to avoid hypoglycemia and hyperglycemia. Please refer to Patient Instructions for Blood Glucose Monitoring and Insulin/Medications Dosing Guide"  in media tab for additional information. [name] participated in the discussions, expressed understanding, and voiced agreement with the above plans.  All questions were answered to his satisfaction. he is encouraged to contact clinic should he have any questions or concerns prior to his return visit.   Follow up plan: - Return in about 2 weeks (around 05/23/2018) for Follow up with Meter and Logs Only - no Labs.  Glade Lloyd, MD [facility] [address] [place], [place] [address] Phone: [phone]  Fax: [phone]    05/09/2018, 11:16 AM  This note was partially dictated with voice recognition software. Similar sounding words can be transcribed inadequately or may not  be corrected upon review.

## 2018-05-10 ENCOUNTER — Encounter (HOSPITAL_COMMUNITY): Payer: Self-pay | Admitting: Physical Therapy

## 2018-05-10 ENCOUNTER — Telehealth (HOSPITAL_COMMUNITY): Payer: Self-pay | Admitting: Physical Therapy

## 2018-05-10 ENCOUNTER — Ambulatory Visit (HOSPITAL_COMMUNITY): Payer: Medicaid Other | Admitting: Physical Therapy

## 2018-05-10 NOTE — Telephone Encounter (Signed)
Pt called and requested to D/C -he saw the nurse yesterday and he is better

## 2018-05-10 NOTE — Therapy (Signed)
[facility] [place], [place], [address] Phone: [phone]   Fax:  [phone]  Patient Details  Name: [name] MRN: [id number] Date of Birth: July 15, 1960 Referring Provider:  No ref. provider found  Encounter Date: 05/10/2018  PHYSICAL THERAPY DISCHARGE SUMMARY  Visits from Start of Care: 15   Current functional level related to goals / functional outcomes: Unable to assess as patient did not return for re-assessment.    Remaining deficits: Unable to assess as patient did not return for re-assessment.   Education / Equipment: Patient had been educated on HEP and benefits of HEP as well as about performing regular skin checks of his residual limb.  Plan: Patient agrees to discharge.  Patient goals were partially met. Patient is being discharged due to the patient's request.  ?????          Clarene Critchley PT, DPT 9:43 AM, 05/10/18 [place], [place], [address] Phone: [phone]   Fax:  [phone]

## 2018-05-22 ENCOUNTER — Encounter: Payer: Medicaid Other | Admitting: Physical Medicine & Rehabilitation

## 2018-05-24 ENCOUNTER — Ambulatory Visit (INDEPENDENT_AMBULATORY_CARE_PROVIDER_SITE_OTHER): Payer: Medicaid Other | Admitting: "Endocrinology

## 2018-05-24 ENCOUNTER — Encounter: Payer: Self-pay | Admitting: "Endocrinology

## 2018-05-24 VITALS — BP 138/88 | HR 84 | Ht 67.0 in | Wt 226.0 lb

## 2018-05-24 DIAGNOSIS — E782 Mixed hyperlipidemia: Secondary | ICD-10-CM | POA: Diagnosis not present

## 2018-05-24 DIAGNOSIS — E1159 Type 2 diabetes mellitus with other circulatory complications: Secondary | ICD-10-CM

## 2018-05-24 DIAGNOSIS — I10 Essential (primary) hypertension: Secondary | ICD-10-CM

## 2018-05-24 NOTE — Progress Notes (Signed)
Endocrinology follow-up  Note       05/24/2018, 12:00 PM   Subjective:    Patient ID: [name], male    DOB: 08-Jul-1960.  Roger Tawil is being seen in follow-up for management of currently uncontrolled complicated symptomatic  Type 2 diabetes, hyperlipidemia, hypertension. PMD:   Vidal Schwalbe, MD.   Past Medical History:  Diagnosis Date  . Diabetes mellitus without complication ([place])   . Gout   . Hyperlipidemia   . Hypertension   . Insomnia    Past Surgical History:  Procedure Laterality Date  . AMPUTATION Left 10/05/2017   Procedure: LEFT BELOW KNEE AMPUTATION;  Surgeon: [name], MD;  Location: [facility];  Service: Orthopedics;  Laterality: Left;  . JOINT REPLACEMENT     right knee  . LEG SURGERY     Social History   Socioeconomic History  . Marital status: Single    Spouse name: Not on file  . Number of children: Not on file  . Years of education: Not on file  . Highest education level: Not on file  Occupational History  . Not on file  Social Needs  . Financial resource strain: Not on file  . Food insecurity:    Worry: Not on file    Inability: Not on file  . Transportation needs:    Medical: Not on file    Non-medical: Not on file  Tobacco Use  . Smoking status: Former Smoker    Packs/day: 1.00    Years: 50.00    Pack years: 50.00    Types: Cigarettes  . Smokeless tobacco: Never Used  Substance and Sexual Activity  . Alcohol use: Not Currently  . Drug use: No  . Sexual activity: Not on file  Lifestyle  . Physical activity:    Days per week: Not on file    Minutes per session: Not on file  . Stress: Not on file  Relationships  . Social connections:    Talks on phone: Not on file    Gets together: Not on file    Attends religious service: Not on file    Active member of club or organization: Not on file    Attends meetings of clubs or  organizations: Not on file    Relationship status: Not on file  Other Topics Concern  . Not on file  Social History Narrative  . Not on file   Outpatient Encounter Medications as of 05/24/2018  Medication Sig  . acetaminophen (TYLENOL) 325 MG tablet Take 2 tablets (650 mg total) by mouth every 6 (six) hours.  Marland Kitchen amitriptyline (ELAVIL) 25 MG tablet Take 25 mg by mouth at bedtime.  Marland Kitchen amLODipine (NORVASC) 10 MG tablet Take 1 tablet (10 mg total) by mouth daily.  Marland Kitchen aspirin 81 MG chewable tablet Chew 81 mg by mouth daily.  . Cholecalciferol (VITAMIN D3) 125 MCG (5000 UT) CAPS TAKE (1) CAPSULE BY MOUTH ONCE DAILY.  Marland Kitchen Insulin Aspart (NOVOLOG FLEXPEN [place]) Inject 15-21 Units into the skin 3 (three) times daily before meals.  . insulin glargine (LANTUS) 100 UNIT/ML injection Inject 0.8 mLs (80 Units total) into the  skin daily.  . Melatonin 3 MG TABS Take 1.5 tablets (4.5 mg total) by mouth at bedtime as needed (sleep).  . metFORMIN (GLUCOPHAGE-XR) 500 MG 24 hr tablet TAKE 1 TABLET BY MOUTH TWICE DAILY AFTER A MEAL  . metoprolol succinate (TOPROL-XL) 25 MG 24 hr tablet Take 1 tablet (25 mg total) by mouth daily.  . Multiple Vitamin (MULTIVITAMIN WITH MINERALS) TABS tablet Take 1 tablet by mouth daily.  . pantoprazole (PROTONIX) 40 MG tablet Take 1 tablet (40 mg total) by mouth daily.  . polyethylene glycol (MIRALAX / GLYCOLAX) packet Take 17 g by mouth daily.  . rosuvastatin (CRESTOR) 5 MG tablet Take 5 mg by mouth daily.  . [DISCONTINUED] LANTUS SOLOSTAR 100 UNIT/ML Solostar Pen INJECT 80 UNITS S.Q. AT BEDTIME.   No facility-administered encounter medications on file as of 05/24/2018.     ALLERGIES: No Known Allergies  VACCINATION STATUS:  There is no immunization history on file for this patient.  Diabetes  He presents for his follow-up diabetic visit. He has type 2 diabetes mellitus. Onset time: He was diagnosed at approximate age of 11 years. His disease course has been worsening. There are  no hypoglycemic associated symptoms. Pertinent negatives for hypoglycemia include no confusion, headaches, pallor or seizures. Associated symptoms include blurred vision, polydipsia and polyuria. Pertinent negatives for diabetes include no chest pain, no fatigue, no polyphagia and no weakness. There are no hypoglycemic complications. Symptoms are worsening. Diabetic complications include nephropathy and PVD. (Patient is status post left below-knee amputation as a complication of diabetes.) Risk factors for coronary artery disease include diabetes mellitus, dyslipidemia, family history, male sex, obesity, hypertension, sedentary lifestyle and tobacco exposure. Current diabetic treatment includes insulin injections and oral agent (monotherapy) (Is currently on Lantus 28 units daily in the morning and metformin 2000 g daily.). His weight is increasing steadily. He is following a generally unhealthy diet. When asked about meal planning, he reported none. He has not had a previous visit with a dietitian. 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 His breakfast blood glucose range is generally >200 mg/dl. His lunch blood glucose range is generally >200 mg/dl. His dinner blood glucose range is generally >200 mg/dl. His bedtime blood glucose range is generally >200 mg/dl. His overall blood glucose range is >200 mg/dl. (He returns with persistent severe hyperglycemia, despite basal/bolus insulin.  His most recent A1c of 12.8%.   He did not document his insulin administration records. ) An ACE inhibitor/angiotensin II receptor blocker is not being taken. He does not see a podiatrist.Eye exam is current.  Hyperlipidemia  This is a chronic problem. The current episode started more than 1 year ago. Exacerbating diseases include diabetes and obesity. Pertinent negatives include no chest pain, myalgias or shortness of breath. Current antihyperlipidemic treatment includes statins.  Risk factors for coronary artery disease include dyslipidemia, diabetes mellitus, hypertension, male sex, obesity, a sedentary lifestyle and family history.  Hypertension  This is a chronic problem. The current episode started more than 1 year ago. Associated symptoms include blurred vision. Pertinent negatives include no chest pain, headaches, neck pain, palpitations or shortness of breath. Risk factors for coronary artery disease include dyslipidemia, family history, diabetes mellitus, obesity, male gender, sedentary lifestyle and smoking/tobacco exposure. Past treatments include calcium channel blockers and beta blockers. Hypertensive end-organ damage includes PVD.     Review of Systems  Constitutional: Negative for chills, fatigue, fever and unexpected weight change.  HENT: Negative for dental problem, mouth sores  and trouble swallowing.   Eyes: Positive for blurred vision. Negative for visual disturbance.  Respiratory: Negative for cough, choking, chest tightness, shortness of breath and wheezing.   Cardiovascular: Negative for chest pain, palpitations and leg swelling.  Gastrointestinal: Negative for abdominal distention, abdominal pain, constipation, diarrhea, nausea and vomiting.  Endocrine: Positive for polydipsia and polyuria. Negative for polyphagia.  Genitourinary: Negative for dysuria, flank pain, hematuria and urgency.  Musculoskeletal: Positive for gait problem. Negative for back pain, myalgias and neck pain.       Patient is on a wheelchair due to recent left BKA.  Skin: Negative for pallor, rash and wound.  Neurological: Negative for seizures, syncope, weakness, numbness and headaches.  Psychiatric/Behavioral: Negative.  Negative for confusion and dysphoric mood.    Objective:    BP 138/88   Pulse 84   Ht 5\' 7"  (1.702 m)   Wt 226 lb (102.5 kg)   BMI 35.40 kg/m   Wt Readings from Last 3 Encounters:  05/24/18 226 lb (102.5 kg)  05/09/18 223 lb (101.2 kg)  02/15/18 217  lb (98.4 kg)     Physical Exam Constitutional:      General: He is not in acute distress.    Appearance: He is well-developed.     Comments: Generally poor hygiene.  HENT:     Head: Normocephalic and atraumatic.  Neck:     Musculoskeletal: Normal range of motion and neck supple.     Thyroid: No thyromegaly.     Trachea: No tracheal deviation.  Cardiovascular:     Rate and Rhythm: Normal rate.     Pulses:          Dorsalis pedis pulses are 1+ on the right side and 1+ on the left side.       Posterior tibial pulses are 1+ on the right side and 1+ on the left side.     Heart sounds: Normal heart sounds, S1 normal and S2 normal. No murmur. No gallop.   Pulmonary:     Effort: Pulmonary effort is normal. No respiratory distress.     Breath sounds: No wheezing.  Abdominal:     General: There is no distension.     Tenderness: There is no abdominal tenderness. There is no guarding.  Musculoskeletal:     Right shoulder: He exhibits no swelling and no deformity.     Comments: Patient walks with a walker  due to recent left BKA.  Skin:    General: Skin is warm and dry.     Findings: No rash.     Nails: There is no clubbing.   Neurological:     Mental Status: He is alert and oriented to person, place, and time.     Cranial Nerves: No cranial nerve deficit.     Sensory: No sensory deficit.     Gait: Gait normal.     Deep Tendon Reflexes: Reflexes are normal and symmetric.  Psychiatric:        Speech: Speech normal.        Behavior: Behavior is cooperative.        Judgment: Judgment normal.     Comments: Patient is accompanied by his daughter-in-law.    He has reluctant affect.     Diabetic Labs (most recent): Lab Results  Component Value Date   HGBA1C 12.8 (H) 05/02/2018   HGBA1C 11.2 (H) 01/31/2018   HGBA1C 11.4 (H) 10/02/2017   Recent Results (from the past 2160 hour(s))  Hemoglobin A1c  Status: Abnormal   Collection Time: 05/02/18 10:09 AM  Result Value Ref Range    Hgb A1c MFr Bld 12.8 (H) <5.7 % of total Hgb    Comment: For someone without known diabetes, a hemoglobin A1c value of 6.5% or greater indicates that they may have  diabetes and this should be confirmed with a follow-up  test. . For someone with known diabetes, a value <7% indicates  that their diabetes is well controlled and a value  greater than or equal to 7% indicates suboptimal  control. A1c targets should be individualized based on  duration of diabetes, age, comorbid conditions, and  other considerations. . Currently, no consensus exists regarding use of hemoglobin A1c for diagnosis of diabetes for children. .    Mean Plasma Glucose 321 (calc)   eAG (mmol/L) 17.8 (calc)  COMPLETE METABOLIC PANEL WITH GFR     Status: Abnormal   Collection Time: 05/02/18 10:09 AM  Result Value Ref Range   Glucose, Bld 271 (H) 65 - 99 mg/dL    Comment: .            Fasting reference interval . For someone without known diabetes, a glucose value >125 mg/dL indicates that they may have diabetes and this should be confirmed with a follow-up test. .    BUN 19 7 - 25 mg/dL   Creat 0.83 0.70 - 1.33 mg/dL    Comment: For patients >32 years of age, the reference limit for Creatinine is approximately 13% higher for people identified as African-American. .    GFR, Est Non African American 96 > OR = 60 mL/min/1.66m2   GFR, Est African American 112 > OR = 60 mL/min/1.45m2   BUN/Creatinine Ratio NOT APPLICABLE 6 - 22 (calc)   Sodium 138 135 - 146 mmol/L   Potassium 4.4 3.5 - 5.3 mmol/L   Chloride 103 98 - 110 mmol/L   CO2 25 20 - 32 mmol/L   Calcium 9.8 8.6 - 10.3 mg/dL   Total Protein 7.6 6.1 - 8.1 g/dL   Albumin 4.2 3.6 - 5.1 g/dL   Globulin 3.4 1.9 - 3.7 g/dL (calc)   AG Ratio 1.2 1.0 - 2.5 (calc)   Total Bilirubin 0.2 0.2 - 1.2 mg/dL   Alkaline phosphatase (APISO) 63 40 - 115 U/L   AST 11 10 - 35 U/L   ALT 16 9 - 46 U/L     Lipid Panel     Component Value Date/Time   CHOL 134  01/31/2018 0901   TRIG 386 (H) 01/31/2018 0901   HDL 19 (L) 01/31/2018 0901   CHOLHDL 7.1 (H) 01/31/2018 0901   LDLCALC 68 01/31/2018 0901     Assessment & Plan:   1. DM type 2 causing vascular disease ([place]) - Saverio Pomerleau has currently uncontrolled symptomatic type 2 DM since 59 years of age. -Presents with persistent hyperglycemia, significantly above target fasting and at bedtime.  His A1c is higher at 12.8% increasing from 11.2%.  -His daughter-in-law is accompanied him to clinic today, complains that her father-in-law does not follow the dietary recommendations provided.     Recent labs reviewed.  -his diabetes is complicated by peripheral neuropathy, peripheral arterial disease complicated by left BKA and he remains at extremely high risk for more acute and chronic complications which include CAD, CVA, CKD, retinopathy, and neuropathy. These are all discussed in detail with him.  - I have counseled him on diet management and weight loss, by adopting a carbohydrate restricted/protein rich diet.  -  Patient admits there is a room for improvement in his diet and drink choices. -  Suggestion is made for him to avoid simple carbohydrates  from his diet including Cakes, Sweet Desserts / Pastries, Ice Cream, Soda (diet and regular), Sweet Tea, Candies, Chips, Cookies, Store Bought Juices, Alcohol in Excess of  1-2 drinks a day, Artificial Sweeteners, and "Sugar-free" Products. This will help patient to have stable blood glucose profile and potentially avoid unintended weight gain.   - I encouraged him to switch to  unprocessed or minimally processed complex starch and increased protein intake (animal or plant source), fruits, and vegetables.  - he is advised to stick to a routine mealtimes to eat 3 meals  a day and avoid unnecessary snacks ( to snack only to correct hypoglycemia).   - he has been  scheduled with Jearld Fenton, RDN, CDE for individualized diabetes education.  - I  have approached him with the following individualized plan to manage diabetes and patient agrees:   -Based on his current glycemic burden, he will continue to require intensive treatment with basal/bolus insulin.    -Patient himself is likely cognitively challenged, his son and daughter-in-law are offering to help him with execution of this treatment plan.  If his home situation is not adequate for this plan, he should be considered for group home placement.  -He is advised to continue Lantus 80 units nightly, increase NovoLog to 15-21 units 3 times daily AC for pre-meal blood glucose readings of 90 mg/dL, associated with strict monitoring of blood glucose 4 times a day- before meals and at bedtime.    - he is encouraged to call clinic for blood glucose levels less than 70 or above 300 mg /dl. - He is advised to continue metformin 500 mg ER twice daily after breakfast and supper, therapeutically suitable for patient .  - he is not a candidate for SGLT2 inhibitors due to recent amputation as a complication of peripheral arterial disease and peripheral neuropathy.  - he will be considered for incretin therapy as appropriate next visit. - Patient specific target  A1c;  LDL, HDL, Triglycerides, and  Waist Circumference were discussed in detail.  2) BP/HTN:  his blood pressure is controlled to target.  he is advised to continue his current medications including amlodipine 10 mg p.o. daily with breakfast as well as metoprolol 25 mg p.o. Daily.  3) Lipids/HPL: His recent labs showed controlled LDL of 68.  He will continue to benefit from statin therapy.  He is advised to continue Crestor 5 mg p.o. Nightly   Side effects and precautions discussed with him.   4)  Weight/Diet:  Body mass index is 35.4 kg/m.  - clearly complicating his diabetes care. CDE Consult will be initiated . Exercise, and detailed carbohydrates information provided  -  detailed on discharge instructions.  5) Chronic Care/Health  Maintenance:  -he  Is on Statin medications and  is encouraged to initiate and continue to follow up with Ophthalmology, Dentist,  Podiatrist at least yearly or according to recommendations, and advised to  stay away from smoking. I have recommended yearly flu vaccine and pneumonia vaccine at least every 5 years; he is advised on exercise of upper body plus stationary devices due to his limitation from left BKA,  and  sleep for at least 7 hours a day.  - I advised patient to maintain close follow up with Vidal Schwalbe, MD for primary care needs.  - Time spent with the patient: 25 min,  of which >50% was spent in reviewing his blood glucose logs , discussing his hypoglycemia and hyperglycemia episodes, reviewing his current and  previous labs / studies and medications  doses and developing a plan to avoid hypoglycemia and hyperglycemia. Please refer to Patient Instructions for Blood Glucose Monitoring and Insulin/Medications Dosing Guide"  in media tab for additional information. Abram Holsclaw participated in the discussions, expressed understanding, and voiced agreement with the above plans.  All questions were answered to his satisfaction. he is encouraged to contact clinic should he have any questions or concerns prior to his return visit.   Follow up plan: - Return in about 3 months (around 08/22/2018) for Follow up with Pre-visit Labs, Meter, and Logs.  Glade Lloyd, MD [facility] [address] [place],  [address] Phone: [phone]  Fax: [phone]    05/24/2018, 12:00 PM  This note was partially dictated with voice recognition software. Similar sounding words can be transcribed inadequately or may not  be corrected upon review.

## 2018-05-24 NOTE — Patient Instructions (Signed)

## 2018-05-27 ENCOUNTER — Other Ambulatory Visit: Payer: Self-pay | Admitting: *Deleted

## 2018-05-27 ENCOUNTER — Encounter: Payer: Self-pay | Admitting: Gastroenterology

## 2018-05-27 ENCOUNTER — Ambulatory Visit (INDEPENDENT_AMBULATORY_CARE_PROVIDER_SITE_OTHER): Payer: Medicaid Other | Admitting: Gastroenterology

## 2018-05-27 ENCOUNTER — Telehealth: Payer: Self-pay | Admitting: *Deleted

## 2018-05-27 VITALS — BP 124/77 | HR 80 | Temp 97.2°F | Ht 67.0 in | Wt 229.8 lb

## 2018-05-27 DIAGNOSIS — Z1211 Encounter for screening for malignant neoplasm of colon: Secondary | ICD-10-CM

## 2018-05-27 DIAGNOSIS — D649 Anemia, unspecified: Secondary | ICD-10-CM

## 2018-05-27 MED ORDER — CLENPIQ 10-3.5-12 MG-GM -GM/160ML PO SOLN: 1.0000 | Freq: Once | ORAL | 0 refills | Status: AC

## 2018-05-27 NOTE — Progress Notes (Signed)
Primary Care Physician:  Vidal Schwalbe, MD  Primary Gastroenterologist:  Garfield Cornea, MD   Chief Complaint  Patient presents with  . Consult    TCS. Never had done prior. aternal uncle-colon cancer    HPI:  [name] is a 58 y.o. male here at the request of Dr. Bartolo Darter for screening colonoscopy. Patient presents with his son today. 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   Maternal uncle had colon cancer at advanced age. Multiple family members on mother's side of the family had cancer (uncles, brothers) but not sure what kind.   Patient denies constipation, diarrhea, melena, brbpr, heartburn, abd pain, unintentional weight loss, dysphagia, vomiting.    No prior colonoscopy.   Current Outpatient Medications  Medication Sig Dispense Refill  . acetaminophen (TYLENOL) 325 MG tablet Take 2 tablets (650 mg total) by mouth every 6 (six) hours. 240 tablet 0  . amitriptyline (ELAVIL) 25 MG tablet Take 25 mg by mouth at bedtime.    Marland Kitchen amLODipine (NORVASC) 10 MG tablet Take 1 tablet (10 mg total) by mouth daily. 30 tablet 0  . aspirin 81 MG chewable tablet Chew 81 mg by mouth daily.    . Cholecalciferol (VITAMIN D3) 125 MCG (5000 UT) CAPS TAKE (1) CAPSULE BY MOUTH ONCE DAILY. 30 capsule 2  . Insulin Aspart (NOVOLOG FLEXPEN [place]) Inject 15-21 Units into the skin 3 (three) times daily before meals.    . insulin glargine (LANTUS) 100 UNIT/ML injection Inject 0.8 mLs (80 Units total) into the skin daily. 2 vial 1  . Melatonin 3 MG TABS Take 1.5 tablets (4.5 mg total) by mouth at bedtime as needed (sleep). 60 tablet 0  . metFORMIN (GLUCOPHAGE-XR) 500 MG 24 hr tablet TAKE 1 TABLET BY MOUTH TWICE DAILY AFTER A MEAL 60 tablet 2  . metoprolol succinate (TOPROL-XL) 25 MG 24 hr tablet Take 1 tablet (25 mg total) by mouth daily. 30 tablet 0  . Multiple Vitamin (MULTIVITAMIN WITH MINERALS) TABS tablet Take 1 tablet by mouth daily. 30 tablet 0  . pantoprazole  (PROTONIX) 40 MG tablet Take 1 tablet (40 mg total) by mouth daily. 30 tablet 0  . polyethylene glycol (MIRALAX / GLYCOLAX) packet Take 17 g by mouth daily. 30 each 0  . rosuvastatin (CRESTOR) 5 MG tablet Take 5 mg by mouth daily.     No current facility-administered medications for this visit.     Allergies as of 05/27/2018  . (No Known Allergies)    Past Medical History:  Diagnosis Date  . Diabetes mellitus without complication ([place])   . Gout   . Hyperlipidemia   . Hypertension   . Insomnia   . Osteoarthritis     Past Surgical History:  Procedure Laterality Date  . AMPUTATION Left 10/05/2017   Procedure: LEFT BELOW KNEE AMPUTATION;  Surgeon: [name], MD;  Location: [facility];  Service: Orthopedics;  Laterality: Left;  . JOINT REPLACEMENT     right knee  . LEG SURGERY      Family History  Problem Relation Age of Onset  . Diabetes Mother   . Colon cancer Maternal Uncle        greater than age 6  . Cancer Other        multiple family memebers on mother's side of family.     Social History   Socioeconomic History  . Marital status: Single    Spouse name: Not on file  . Number of children: Not  on file  . Years of education: Not on file  . Highest education level: Not on file  Occupational History  . Not on file  Social Needs  . Financial resource strain: Not on file  . Food insecurity:    Worry: Not on file    Inability: Not on file  . Transportation needs:    Medical: Not on file    Non-medical: Not on file  Tobacco Use  . Smoking status: Former Smoker    Packs/day: 1.00    Years: 50.00    Pack years: 50.00    Types: Cigarettes    Last attempt to quit: 10/01/2017    Years since quitting: 0.6  . Smokeless tobacco: Never Used  Substance and Sexual Activity  . 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 Drug use: No  . Sexual activity: Not on file  Lifestyle  . Physical activity:    Days per week: Not on file     Minutes per session: Not on file  . Stress: Not on file  Relationships  . Social connections:    Talks on phone: Not on file    Gets together: Not on file    Attends religious service: Not on file    Active member of club or organization: Not on file    Attends meetings of clubs or organizations: Not on file    Relationship status: Not on file  . Intimate partner violence:    Fear of current or ex partner: Not on file    Emotionally abused: Not on file    Physically abused: Not on file    Forced sexual activity: Not on file  Other Topics Concern  . Not on file  Social History Narrative  . Not on file      ROS:  General: Negative for anorexia, weight loss, fever, chills, fatigue, weakness. Eyes: Negative for vision changes.  ENT: Negative for hoarseness, difficulty swallowing , nasal congestion. CV: Negative for chest pain, angina, palpitations, dyspnea on exertion, peripheral edema.  Respiratory: Negative for dyspnea at rest, dyspnea on exertion, cough, sputum, wheezing.  GI: See history of present illness. GU:  Negative for dysuria, hematuria, urinary incontinence, urinary frequency, nocturnal urination.  MS: Negative for joint pain, low back pain.  Derm: Negative for rash or itching.  Neuro: Negative for weakness, abnormal sensation, seizure, frequent headaches, memory loss, confusion.  Psych: Negative for anxiety, depression, suicidal ideation, hallucinations.  Endo: Negative for unusual weight change.  Heme: Negative for bruising or bleeding. Allergy: Negative for rash or hives.    Physical Examination:  BP 124/77   Pulse 80   Temp (!) 97.2 F (36.2 C) (Oral)   Ht 5\' 7"  (1.702 m)   Wt 229 lb 12.8 oz (104.2 kg)   BMI 35.99 kg/m    General: Well-nourished, well-developed in no acute distress.  Head: Normocephalic, atraumatic.   Eyes: Conjunctiva pink, no icterus. Mouth: Oropharyngeal mucosa moist and pink , no lesions erythema or exudate. Neck: Supple without  thyromegaly, masses, or lymphadenopathy.  Lungs: Clear to auscultation bilaterally.  Heart: Regular rate and rhythm, no murmurs rubs or gallops.  Abdomen: Bowel sounds are normal, nontender, nondistended, no hepatosplenomegaly or masses, no abdominal bruits or    hernia , no rebound or guarding.   Rectal: not performed Extremities: left BKA. No lower extremity edema. No clubbing or deformities.  Neuro: Alert and oriented x 4 , grossly normal neurologically.  Skin:  Warm and dry, no rash or jaundice.   Psych: Alert and cooperative, normal mood and affect.  Labs: Lab Results  Component Value Date   HGBA1C 12.8 (H) 05/02/2018   Lab Results  Component Value Date   CREATININE 0.83 05/02/2018   BUN 19 05/02/2018   NA 138 05/02/2018   K 4.4 05/02/2018   CL 103 05/02/2018   CO2 25 05/02/2018   Lab Results  Component Value Date   ALT 16 05/02/2018   AST 11 05/02/2018   ALKPHOS 84 10/10/2017   BILITOT 0.2 05/02/2018   Lab Results  Component Value Date   WBC 10.4 10/15/2017   HGB 10.0 (L) 10/15/2017   HCT 31.6 (L) 10/15/2017   MCV 90.8 10/15/2017   PLT 525 (H) 10/15/2017     Imaging Studies: No results found.

## 2018-05-27 NOTE — Assessment & Plan Note (Signed)
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 May be secondary to anemia of chronic disease. Dates back at least to 2013. Recheck with preop labs.

## 2018-05-27 NOTE — Patient Instructions (Signed)
Colonoscopy as scheduled.  Please see separate instructions.

## 2018-05-27 NOTE — Telephone Encounter (Signed)
Spoke with pt and appt scheduled for 07/22/2018 at 7:30am with RMR. Aware will call with pre-op appt. Instructions will be mailed. Prep sent into the pharmacy

## 2018-05-27 NOTE — Assessment & Plan Note (Signed)
First ever colonoscopy, plan for deep sedation given prior h/o etoh abuse.  I have discussed the risks, alternatives, benefits with regards to but not limited to the risk of reaction to medication, bleeding, infection, perforation and the patient is agreeable to proceed. Written consent to be obtained.

## 2018-05-28 NOTE — Progress Notes (Signed)
cc'ed to pcp °

## 2018-05-28 NOTE — Telephone Encounter (Signed)
Pre-op scheduled for 07/18/2018 at 9:00am. Letter mailed. Patient aware

## 2018-06-04 ENCOUNTER — Telehealth: Payer: Self-pay

## 2018-06-04 DIAGNOSIS — E1159 Type 2 diabetes mellitus with other circulatory complications: Secondary | ICD-10-CM

## 2018-06-04 MED ORDER — GLUCOSE BLOOD VI STRP: 100.0000 | ORAL_STRIP | Status: DC | PRN

## 2018-06-04 NOTE — Telephone Encounter (Signed)
[name], CMA  

## 2018-06-06 ENCOUNTER — Telehealth: Payer: Self-pay

## 2018-06-06 DIAGNOSIS — E1159 Type 2 diabetes mellitus with other circulatory complications: Secondary | ICD-10-CM

## 2018-06-06 MED ORDER — GLUCOSE BLOOD VI STRP: 100.0000 | ORAL_STRIP | Freq: Four times a day (QID) | Status: DC

## 2018-06-06 MED ORDER — INSULIN ASPART 100 UNIT/ML FLEXPEN: 15.0000 [IU] | PEN_INJECTOR | Freq: Three times a day (TID) | SUBCUTANEOUS | 11 refills | Status: DC

## 2018-06-06 NOTE — Telephone Encounter (Signed)
[name], CMA  

## 2018-06-25 ENCOUNTER — Other Ambulatory Visit: Payer: Self-pay | Admitting: "Endocrinology

## 2018-07-11 ENCOUNTER — Telehealth: Payer: Self-pay | Admitting: *Deleted

## 2018-07-11 NOTE — Telephone Encounter (Signed)
Spoke with patient and he is r/s'd to 09/12/2018 at 8:45am. Patient aware will mail new instructions/pre-op to him.

## 2018-07-12 ENCOUNTER — Other Ambulatory Visit: Payer: Self-pay | Admitting: "Endocrinology

## 2018-07-18 ENCOUNTER — Inpatient Hospital Stay (HOSPITAL_COMMUNITY): Admission: RE | Admit: 2018-07-18 | Payer: Medicaid Other | Source: Ambulatory Visit

## 2018-08-07 ENCOUNTER — Other Ambulatory Visit: Payer: Self-pay | Admitting: "Endocrinology

## 2018-08-22 ENCOUNTER — Ambulatory Visit: Payer: Medicaid Other | Admitting: "Endocrinology

## 2018-09-02 NOTE — Patient Instructions (Signed)
[name]  09/02/2018     @PREFPERIOPPHARMACY @   Your procedure is scheduled on  09/12/2018  Report to [facility] at  41   A.M.  Call this number if you have problems the morning of surgery:  [phone]   Remember:  Follow the diet and prep instructions given to you by Dr Roseanne Kaufman office.                       Take these medicines the morning of surgery with A SIP OF WATER Amlodipine, metoprolol, protonix. Take 1/2 of your usual night time insulin the night before your procedure. DO NOT take any medications for diabetes the morning of your procedure.    Do not wear jewelry, make-up or nail polish.  Do not wear lotions, powders, or perfumes, or deodorant.  Do not shave 48 hours prior to surgery.  Men may shave face and neck.  Do not bring valuables to the hospital.  [facility] is not responsible for any belongings or valuables.  Contacts, dentures or bridgework may not be worn into surgery.  Leave your suitcase in the car.  After surgery it may be brought to your room.  For patients admitted to the hospital, discharge time will be determined by your treatment team.  Patients discharged the day of surgery will not be allowed to drive home.   Name and phone number of your driver:   family Special instructions:  None  Please read over the following fact sheets that you were given. Anesthesia Post-op Instructions and Care and Recovery After Surgery       Colonoscopy, Adult, Care After This sheet gives you information about how to care for yourself after your procedure. Your health care provider may also give you more specific instructions. If you have problems or questions, contact your health care provider. What can I expect after the procedure? After the procedure, it is common to have:  A small amount of blood in your stool for 24 hours after the procedure.  Some gas.  Mild abdominal cramping or bloating. Follow these instructions at home: General  instructions  For the first 24 hours after the procedure: ? Do not drive or use machinery. ? Do not sign important documents. ? Do not drink alcohol. ? Do your regular daily activities at a slower pace than normal. ? Eat soft, easy-to-digest foods.  Take over-the-counter or prescription medicines only as told by your health care provider. Relieving cramping and bloating   Try walking around when you have cramps or feel bloated.  Apply heat to your abdomen as told by your health care provider. Use a heat source that your health care provider recommends, such as a moist heat pack or a heating pad. ? Place a towel between your skin and the heat source. ? Leave the heat on for 20-30 minutes. ? Remove the heat if your skin turns bright red. This is especially important if you are unable to feel pain, heat, or cold. You may have a greater risk of getting burned. Eating and drinking   Drink enough fluid to keep your urine pale yellow.  Resume your normal diet as instructed by your health care provider. Avoid heavy or fried foods that are hard to digest.  Avoid drinking alcohol for as long as instructed by your health care provider. Contact a health care provider if:  You have blood in your stool 2-3 days after the procedure. Get  help right away if:  You have more than a small spotting of blood in your stool.  You pass large blood clots in your stool.  Your abdomen is swollen.  You have nausea or vomiting.  You have a fever.  You have increasing abdominal pain that is not relieved with medicine. Summary  After the procedure, it is common to have a small amount of blood in your stool. You may also have mild abdominal cramping and bloating.  For the first 24 hours after the procedure, do not drive or use machinery, sign important documents, or drink alcohol.  Contact your health care provider if you have a lot of blood in your stool, nausea or vomiting, a fever, or increased  abdominal pain. This information is not intended to replace advice given to you by your health care provider. Make sure you discuss any questions you have with your health care provider. Document Released: 11/02/2003 Document Revised: 01/10/2017 Document Reviewed: 06/01/2015 Elsevier Interactive Patient Education  2019 [place], Care After These instructions provide you with information about caring for yourself after your procedure. Your health care provider may also give you more specific instructions. Your treatment has been planned according to current medical practices, but problems sometimes occur. Call your health care provider if you have any problems or questions after your procedure. What can I expect after the procedure? After your procedure, you may:  Feel sleepy for several hours.  Feel clumsy and have poor balance for several hours.  Feel forgetful about what happened after the procedure.  Have poor judgment for several hours.  Feel nauseous or vomit.  Have a sore throat if you had a breathing tube during the procedure. Follow these instructions at home: For at least 24 hours after the procedure:      Have a responsible adult stay with you. It is important to have someone help care for you until you are awake and alert.  Rest as needed.  Do not: ? Participate in activities in which you could fall or become injured. ? Drive. ? Use heavy machinery. ? Drink alcohol. ? Take sleeping pills or medicines that cause drowsiness. ? Make important decisions or sign legal documents. ? Take care of children on your own. Eating and drinking  Follow the diet that is recommended by your health care provider.  If you vomit, drink water, juice, or soup when you can drink without vomiting.  Make sure you have little or no nausea before eating solid foods. General instructions  Take over-the-counter and prescription medicines only as told by  your health care provider.  If you have sleep apnea, surgery and certain medicines can increase your risk for breathing problems. Follow instructions from your health care provider about wearing your sleep device: ? Anytime you are sleeping, including during daytime naps. ? While taking prescription pain medicines, sleeping medicines, or medicines that make you drowsy.  If you smoke, do not smoke without supervision.  Keep all follow-up visits as told by your health care provider. This is important. Contact a health care provider if:  You keep feeling nauseous or you keep vomiting.  You feel light-headed.  You develop a rash.  You have a fever. Get help right away if:  You have trouble breathing. Summary  For several hours after your procedure, you may feel sleepy and have poor judgment.  Have a responsible adult stay with you for at least 24 hours or until you are awake and alert.  This information is not intended to replace advice given to you by your health care provider. Make sure you discuss any questions you have with your health care provider. Document Released: 07/11/2015 Document Revised: 11/03/2016 Document Reviewed: 07/11/2015 Elsevier Interactive Patient Education  2019 [employer].

## 2018-09-05 ENCOUNTER — Encounter (HOSPITAL_COMMUNITY): Payer: Self-pay

## 2018-09-05 ENCOUNTER — Other Ambulatory Visit: Payer: Self-pay

## 2018-09-05 ENCOUNTER — Encounter (HOSPITAL_COMMUNITY)
Admission: RE | Admit: 2018-09-05 | Discharge: 2018-09-05 | Disposition: A | Discharge status: Home / Self Care | Payer: Medicaid Other | Source: Ambulatory Visit | Attending: Internal Medicine | Admitting: Internal Medicine

## 2018-09-05 DIAGNOSIS — Z01812 Encounter for preprocedural laboratory examination: Secondary | ICD-10-CM | POA: Diagnosis not present

## 2018-09-05 LAB — BASIC METABOLIC PANEL
Anion gap: 13 (ref 5–15)
BUN: 20 mg/dL (ref 6–20)
CO2: 21 mmol/L — ABNORMAL LOW (ref 22–32)
Calcium: 9.5 mg/dL (ref 8.9–10.3)
Chloride: 101 mmol/L (ref 98–111)
Creatinine, Ser: 0.8 mg/dL (ref 0.61–1.24)
GFR calc Af Amer: >60 mL/min (ref >60)
GFR calc non Af Amer: >60 mL/min (ref >60)
Glucose, Bld: 326 mg/dL — ABNORMAL HIGH (ref 70–99)
Potassium: 3.8 mmol/L (ref 3.5–5.1)
Sodium: 135 mmol/L (ref 135–145)

## 2018-09-05 NOTE — Pre-Procedure Instructions (Signed)
Patient advised of COVID testing appointment 09/09/18 @ 11:00.  Prep instructions provided.  Advised him to have his son go over all materials provided and have him to call with any questions.  Verbalized understanding.

## 2018-09-09 ENCOUNTER — Other Ambulatory Visit: Payer: Self-pay

## 2018-09-09 ENCOUNTER — Other Ambulatory Visit (HOSPITAL_COMMUNITY)
Admission: RE | Admit: 2018-09-09 | Discharge: 2018-09-09 | Disposition: A | Discharge status: Home / Self Care | Payer: Medicaid Other | Source: Ambulatory Visit | Attending: Internal Medicine | Admitting: Internal Medicine

## 2018-09-10 ENCOUNTER — Other Ambulatory Visit: Payer: Self-pay | Admitting: "Endocrinology

## 2018-09-10 ENCOUNTER — Telehealth: Payer: Self-pay | Admitting: *Deleted

## 2018-09-10 NOTE — OR Nursing (Signed)
Attempted to call patient to see why he did not show up for covid test.  Left voice message for patient to call me back at [phone]

## 2018-09-10 NOTE — OR Nursing (Signed)
Spoke with patient son  , the thought the covid test was to be on the 11th.  Patient is going to reschedule to have tcs another day.  Son is aware that the patient needs to call the office.

## 2018-09-10 NOTE — Telephone Encounter (Signed)
Carolyn from endo called. Patient was a NS for COVID-19 screening. They spoke with son this morning and he was suppose to call office to get patient rescheduled as they had dates confused.  I called patient and spoke with son. Patient is rescheduled for 8/27 at 10:15am. Son is aware will mail new instructions with new pre-op appt. Confirmed address. Called endo and made aware. New pre-op scheduled for 8/24 at 1:45pm. Letter mailed with instructions.

## 2018-09-20 ENCOUNTER — Ambulatory Visit: Payer: Medicaid Other | Admitting: "Endocrinology

## 2018-10-02 ENCOUNTER — Ambulatory Visit: Payer: Medicaid Other | Admitting: "Endocrinology

## 2018-10-10 ENCOUNTER — Other Ambulatory Visit: Payer: Self-pay | Admitting: "Endocrinology

## 2018-10-28 ENCOUNTER — Other Ambulatory Visit: Payer: Self-pay | Admitting: "Endocrinology

## 2018-10-29 LAB — COMPREHENSIVE METABOLIC PANEL
ALT: 25 IU/L (ref 0–44)
AST: 17 IU/L (ref 0–40)
Albumin/Globulin Ratio: 1.4 (ref 1.2–2.2)
Albumin: 4.5 g/dL (ref 3.8–4.9)
Alkaline Phosphatase: 105 IU/L (ref 39–117)
BUN/Creatinine Ratio: 14 (ref 9–20)
BUN: 12 mg/dL (ref 6–24)
Bilirubin Total: 0.2 mg/dL (ref 0.0–1.2)
CO2: 23 mmol/L (ref 20–29)
Calcium: 10 mg/dL (ref 8.7–10.2)
Chloride: 98 mmol/L (ref 96–106)
Creatinine, Ser: 0.83 mg/dL (ref 0.76–1.27)
GFR calc Af Amer: 111 mL/min/{1.73_m2} (ref >59)
GFR calc non Af Amer: 96 mL/min/{1.73_m2} (ref >59)
Globulin, Total: 3.3 g/dL (ref 1.5–4.5)
Glucose: 389 mg/dL — ABNORMAL HIGH (ref 65–99)
Potassium: 4.7 mmol/L (ref 3.5–5.2)
Sodium: 137 mmol/L (ref 134–144)
Total Protein: 7.8 g/dL (ref 6.0–8.5)

## 2018-10-29 LAB — SPECIMEN STATUS REPORT

## 2018-10-29 LAB — HGB A1C W/O EAG: Hgb A1c MFr Bld: 12.5 % — ABNORMAL HIGH (ref 4.8–5.6)

## 2018-10-30 ENCOUNTER — Ambulatory Visit: Payer: Medicaid Other | Admitting: "Endocrinology

## 2018-10-31 ENCOUNTER — Encounter: Payer: Self-pay | Admitting: "Endocrinology

## 2018-10-31 ENCOUNTER — Ambulatory Visit (INDEPENDENT_AMBULATORY_CARE_PROVIDER_SITE_OTHER): Payer: Medicaid Other | Admitting: "Endocrinology

## 2018-10-31 ENCOUNTER — Other Ambulatory Visit: Payer: Self-pay

## 2018-10-31 DIAGNOSIS — E1159 Type 2 diabetes mellitus with other circulatory complications: Secondary | ICD-10-CM

## 2018-10-31 DIAGNOSIS — E782 Mixed hyperlipidemia: Secondary | ICD-10-CM | POA: Diagnosis not present

## 2018-10-31 DIAGNOSIS — I10 Essential (primary) hypertension: Secondary | ICD-10-CM

## 2018-10-31 MED ORDER — GLIPIZIDE ER 5 MG PO TB24: 5.0000 mg | ORAL_TABLET | Freq: Every day | ORAL | 3 refills | Status: DC

## 2018-10-31 MED ORDER — INSULIN GLARGINE 100 UNIT/ML ~~LOC~~ SOLN: 100.0000 [IU] | Freq: Every day | SUBCUTANEOUS | 2 refills | Status: DC

## 2018-10-31 NOTE — Progress Notes (Signed)
10/31/2018, 4:39 PM                                                    Endocrinology Telehealth Visit Follow up Note -During COVID -19 Pandemic  This visit type was conducted due to national recommendations for restrictions regarding the COVID-19 Pandemic  in an effort to limit this patient's exposure and mitigate transmission of the corona virus.  Due to his co-morbid illnesses, [name] is at  moderate to high risk for complications without adequate follow up.  This format is felt to be most appropriate for him at this time.  I connected with this patient on 10/31/2018   by telephone and verified that I am speaking with the correct person using two identifiers. [name], 28-Jul-1960. he has verbally consented to this visit. All issues noted in this document were discussed and addressed. The format was not optimal for physical exam.    Subjective:    Patient ID: [name], male    DOB: 04-18-1960.  [name] is being engaged in telehealth via telephone in follow-up for management of currently uncontrolled complicated symptomatic  Type 2 diabetes, hyperlipidemia, hypertension. PMD:   Vidal Schwalbe, MD.   Past Medical History:  Diagnosis Date  . Diabetes mellitus without complication ([place])   . Gout   . Hyperlipidemia   . Hypertension   . Insomnia   . Osteoarthritis    Past Surgical History:  Procedure Laterality Date  . AMPUTATION Left 10/05/2017   Procedure: LEFT BELOW KNEE AMPUTATION;  Surgeon: [name], MD;  Location: [facility];  Service: Orthopedics;  Laterality: Left;  . JOINT REPLACEMENT     right knee  . LEG SURGERY     Social History   Socioeconomic History  . Marital status: Single    Spouse name: Not on file  . Number of children: Not on file  . Years of education: Not on file  . Highest education level: Not on file  Occupational History  . Not on  file  Social Needs  . Financial resource strain: Not on file  . Food insecurity    Worry: Not on file    Inability: Not on file  . Transportation needs    Medical: Not on file    Non-medical: Not on file  Tobacco Use  . Smoking status: Former Smoker    Packs/day: 1.00    Years: 50.00    Pack years: 50.00    Types: Cigarettes    Quit date: 10/01/2017    Years since quitting: 1.0  . Smokeless tobacco: Never Used  Substance and Sexual Activity  . 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 Drug use: No  . Sexual activity: Not on file  Lifestyle  . Physical activity    Days per week: Not on file    Minutes per session: Not on file  . Stress: Not on file  Relationships  . Social connections  Talks on phone: Not on file    Gets together: Not on file    Attends religious service: Not on file    Active member of club or organization: Not on file    Attends meetings of clubs or organizations: Not on file    Relationship status: Not on file  Other Topics Concern  . Not on file  Social History Narrative  . Not on file   Outpatient Encounter Medications as of 10/31/2018  Medication Sig  . acetaminophen (TYLENOL) 325 MG tablet Take 2 tablets (650 mg total) by mouth every 6 (six) hours.  Marland Kitchen amLODipine (NORVASC) 10 MG tablet Take 1 tablet (10 mg total) by mouth daily.  Marland Kitchen aspirin 81 MG chewable tablet Chew 81 mg by mouth daily.  . Cholecalciferol (VITAMIN D) 125 MCG (5000 UT) CAPS TAKE (1) CAPSULE BY MOUTH ONCE DAILY.  Marland Kitchen glipiZIDE (GLUCOTROL XL) 5 MG 24 hr tablet Take 1 tablet (5 mg total) by mouth daily with breakfast.  . insulin aspart (NOVOLOG FLEXPEN) 100 UNIT/ML FlexPen Inject 15-21 Units into the skin 3 (three) times daily before meals.  Marland Kitchen LANTUS SOLOSTAR 100 UNIT/ML Solostar Pen INJECT 80 UNITS S.Q. AT BEDTIME.  . Melatonin 3 MG TABS Take 1.5 tablets (4.5 mg total) by mouth at bedtime as needed (sleep).  . metFORMIN (GLUCOPHAGE-XR) 500 MG 24  hr tablet TAKE 1 TABLET BY MOUTH TWICE DAILY AFTER A MEAL  . metoprolol succinate (TOPROL-XL) 25 MG 24 hr tablet Take 1 tablet (25 mg total) by mouth daily.  . Multiple Vitamin (MULTIVITAMIN WITH MINERALS) TABS tablet Take 1 tablet by mouth daily.  . pantoprazole (PROTONIX) 40 MG tablet Take 1 tablet (40 mg total) by mouth daily.  . polyethylene glycol (MIRALAX / GLYCOLAX) packet Take 17 g by mouth daily. (Patient taking differently: Take 17 g by mouth daily as needed for moderate constipation. )  . rosuvastatin (CRESTOR) 5 MG tablet Take 5 mg by mouth daily.  Marland Kitchen ULTICARE MINI PEN NEEDLES 31G X 6 MM MISC USE AS DIRECTED  . [DISCONTINUED] insulin glargine (LANTUS) 100 UNIT/ML injection Inject 0.8 mLs (80 Units total) into the skin daily. (Patient taking differently: Inject 80 Units into the skin at bedtime. )  . [DISCONTINUED] insulin glargine (LANTUS) 100 UNIT/ML injection Inject 1 mL (100 Units total) into the skin daily.   Facility-Administered Encounter Medications as of 10/31/2018  Medication  . glucose blood test strip STRP 100 each  . glucose blood test strip STRP 100 each    ALLERGIES: No Known Allergies  VACCINATION STATUS:  There is no immunization history on file for this patient.  Diabetes He presents for his follow-up diabetic visit. He has type 2 diabetes mellitus. Onset time: He was diagnosed at approximate age of 44 years. His disease course has been worsening. There are no hypoglycemic associated symptoms. Pertinent negatives for hypoglycemia include no confusion, headaches, pallor or seizures. Associated symptoms include blurred vision, polydipsia and polyuria. Pertinent negatives for diabetes include no chest pain, no fatigue, no polyphagia and no weakness. There are no hypoglycemic complications. Symptoms are stable. Diabetic complications include nephropathy and PVD. (Patient is status post left below-knee amputation as a complication of diabetes.) Risk factors for coronary  artery disease include diabetes mellitus, dyslipidemia, family history, male sex, obesity, hypertension, sedentary lifestyle and tobacco exposure. Current diabetic treatment includes insulin injections and oral agent (monotherapy) (Is currently on Lantus 28 units daily in the morning and metformin 2000 g daily.). He is following a generally unhealthy  diet. When asked about meal planning, he reported none. He has not had a previous visit with a dietitian. 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 His breakfast blood glucose range is generally >200 mg/dl. His lunch blood glucose range is generally >200 mg/dl. His dinner blood glucose range is generally >200 mg/dl. His bedtime blood glucose range is generally >200 mg/dl. His overall blood glucose range is >200 mg/dl. ( ) An ACE inhibitor/angiotensin II receptor blocker is not being taken. He does not see a podiatrist.Eye exam is current.  Hyperlipidemia This is a chronic problem. The current episode started more than 1 year ago. Exacerbating diseases include diabetes and obesity. Pertinent negatives include no chest pain, myalgias or shortness of breath. Current antihyperlipidemic treatment includes statins. Risk factors for coronary artery disease include dyslipidemia, diabetes mellitus, hypertension, male sex, obesity, a sedentary lifestyle and family history.  Hypertension This is a chronic problem. The current episode started more than 1 year ago. Associated symptoms include blurred vision. Pertinent negatives include no chest pain, headaches, neck pain, palpitations or shortness of breath. Risk factors for coronary artery disease include dyslipidemia, family history, diabetes mellitus, obesity, male gender, sedentary lifestyle and smoking/tobacco exposure. Past treatments include calcium channel blockers and beta blockers. Hypertensive end-organ damage includes PVD.    Review of Systems  Constitutional:  Negative for chills, fatigue, fever and unexpected weight change.  HENT: Negative for dental problem, mouth sores and trouble swallowing.   Eyes: Positive for blurred vision. Negative for visual disturbance.  Respiratory: Negative for cough, choking, chest tightness, shortness of breath and wheezing.   Cardiovascular: Negative for chest pain, palpitations and leg swelling.  Gastrointestinal: Negative for abdominal distention, abdominal pain, constipation, diarrhea, nausea and vomiting.  Endocrine: Positive for polydipsia and polyuria. Negative for polyphagia.  Genitourinary: Negative for dysuria, flank pain, hematuria and urgency.  Musculoskeletal: Positive for gait problem. Negative for back pain, myalgias and neck pain.       Patient is on a wheelchair due to recent left BKA.  Skin: Negative for pallor, rash and wound.  Neurological: Negative for seizures, syncope, weakness, numbness and headaches.  Psychiatric/Behavioral: Negative.  Negative for confusion and dysphoric mood.    Objective:    There were no vitals taken for this visit.  Wt Readings from Last 3 Encounters:  09/05/18 226 lb (102.5 kg)  05/27/18 229 lb 12.8 oz (104.2 kg)  05/24/18 226 lb (102.5 kg)     Diabetic Labs (most recent): Lab Results  Component Value Date   HGBA1C 12.5 (H) 10/28/2018   HGBA1C 12.8 (H) 05/02/2018   HGBA1C 11.2 (H) 01/31/2018   Recent Results (from the past 2160 hour(s))  Basic metabolic panel     Status: Abnormal   Collection Time: 09/05/18 11:06 AM  Result Value Ref Range   Sodium 135 135 - 145 mmol/L   Potassium 3.8 3.5 - 5.1 mmol/L   Chloride 101 98 - 111 mmol/L   CO2 21 (L) 22 - 32 mmol/L   Glucose, Bld 326 (H) 70 - 99 mg/dL   BUN 20 6 - 20 mg/dL   Creatinine, Ser 0.80 0.61 - 1.24 mg/dL   Calcium 9.5 8.9 - 10.3 mg/dL   GFR calc non Af Amer >60 >60 mL/min   GFR calc Af Amer >60 >60 mL/min   Anion gap 13 5 - 15    Comment: Performed at [facility], [address]  [place], [place] [address]  Comprehensive metabolic panel  Status: Abnormal   Collection Time: 10/28/18  2:11 PM  Result Value Ref Range   Glucose 389 (H) 65 - 99 mg/dL   BUN 12 6 - 24 mg/dL   Creatinine, Ser 0.83 0.76 - 1.27 mg/dL   GFR calc non Af Amer 96 >59 mL/min/1.73   GFR calc Af Amer 111 >59 mL/min/1.73   BUN/Creatinine Ratio 14 9 - 20   Sodium 137 134 - 144 mmol/L   Potassium 4.7 3.5 - 5.2 mmol/L   Chloride 98 96 - 106 mmol/L   CO2 23 20 - 29 mmol/L   Calcium 10.0 8.7 - 10.2 mg/dL   Total Protein 7.8 6.0 - 8.5 g/dL   Albumin 4.5 3.8 - 4.9 g/dL   Globulin, Total 3.3 1.5 - 4.5 g/dL   Albumin/Globulin Ratio 1.4 1.2 - 2.2   Bilirubin Total 0.2 0.0 - 1.2 mg/dL   Alkaline Phosphatase 105 39 - 117 IU/L   AST 17 0 - 40 IU/L   ALT 25 0 - 44 IU/L  Hgb A1c w/o eAG     Status: Abnormal   Collection Time: 10/28/18  2:11 PM  Result Value Ref Range   Hgb A1c MFr Bld 12.5 (H) 4.8 - 5.6 %    Comment:          Prediabetes: 5.7 - 6.4          Diabetes: >6.4          Glycemic control for adults with diabetes: <7.0   Specimen status report     Status: None   Collection Time: 10/28/18  2:11 PM  Result Value Ref Range   specimen status report Comment     Comment: Isac Caddy CMP14 Default Ambig Abbrev CMP14 Default A hand-written panel/profile was received from your office. In accordance with the [facility] Ambiguous Test Code Policy dated July 1601, we have completed your order by using the closest currently or formerly recognized AMA panel.  We have assigned Comprehensive Metabolic Panel (14), Test Code #322000 to this request.  If this is not the testing you wished to receive on this specimen, please contact the [facility] Client Inquiry/Technical Services Department to clarify the test order.  We appreciate your business.      Lipid Panel     Component Value Date/Time   CHOL 134 01/31/2018 0901   TRIG 386 (H) 01/31/2018 0901   HDL 19 (L) 01/31/2018 0901   CHOLHDL 7.1 (H)  01/31/2018 0901   LDLCALC 68 01/31/2018 0901     Assessment & Plan:   1. DM type 2 causing vascular disease ([place]) - Giavonni Butler has currently uncontrolled symptomatic type 2 DM since 58 years of age. -Patient reports still persistently above target hypoglycemia, A1c of 12.5%, unchanged from his last visit A1c of 12.8%.    -He is being assisted by his son Mr. Moist reporting his progress.  He insists that he has been following his regimen prescribed for him during his last visit.   Recent labs reviewed.  -his diabetes is complicated by peripheral neuropathy, peripheral arterial disease complicated by left BKA and he remains at extremely high risk for more acute and chronic complications which include CAD, CVA, CKD, retinopathy, and neuropathy. These are all discussed in detail with him.  - I have counseled him on diet management and weight loss, by adopting a carbohydrate restricted/protein rich diet.  - he  admits there is a room for improvement in his diet and drink choices. -  Suggestion is made  for him to avoid simple carbohydrates  from his diet including Cakes, Sweet Desserts / Pastries, Ice Cream, Soda (diet and regular), Sweet Tea, Candies, Chips, Cookies, Sweet Pastries,  Store Bought Juices, Alcohol in Excess of  1-2 drinks a day, Artificial Sweeteners, Coffee Creamer, and "Sugar-free" Products. This will help patient to have stable blood glucose profile and potentially avoid unintended weight gain.  - I encouraged him to switch to  unprocessed or minimally processed complex starch and increased protein intake (animal or plant source), fruits, and vegetables.  - he is advised to stick to a routine mealtimes to eat 3 meals  a day and avoid unnecessary snacks ( to snack only to correct hypoglycemia).   - he has been  scheduled with Jearld Fenton, RDN, CDE for individualized diabetes education.  - I have approached him with the following individualized plan to manage  diabetes and patient agrees:   -Based on his current glycemic burden, he will continue to require intensive treatment with basal/bolus insulin.    -Patient himself is likely cognitively challenged, his son and daughter-in-law are offering to help him with execution of this treatment plan.  If his home situation is not adequate for this plan, he should be considered for group home placement.  -He is advised to increase Lantus to 100 units nightly, continue NovoLog  15-21 units 3 times daily AC for pre-meal blood glucose readings of 90 mg/dL, associated with strict monitoring of blood glucose 4 times a day- before meals and at bedtime.    - he is encouraged to call clinic for blood glucose levels less than 70 or above 300 mg /dl. - He is advised to continue metformin 500 mg ER twice daily after breakfast and supper, therapeutically suitable for patient . -I discussed and added glipizide 5 mg XL p.o. daily at breakfast.  - he is not a candidate for SGLT2 inhibitors due to recent amputation as a complication of peripheral arterial disease and peripheral neuropathy.  -He is not a suitable candidate for incretin therapy, nor recently a candidate for SGLT2 inhibitors.   - Patient specific target  A1c;  LDL, HDL, Triglycerides, and  Waist Circumference were discussed in detail.  2) BP/HTN:  he is advised to home monitor blood pressure and report if > 140/90 on 2 separate readings.   he is advised to continue his current medications including amlodipine 10 mg p.o. daily with breakfast as well as metoprolol 25 mg p.o. Daily.  3) Lipids/HPL: His recent labs showed controlled LDL of 68.  He will continue to benefit from statin therapy.  He is advised to continue Crestor 5 mg p.o. nightly.  Side effects and precautions discussed with him.   4)  Weight/Diet:   CDE Consult will be initiated . Exercise, and detailed carbohydrates information provided  -  detailed on discharge instructions.  5) Chronic  Care/Health Maintenance:  -he  Is on Statin medications and  is encouraged to initiate and continue to follow up with Ophthalmology, Dentist,  Podiatrist at least yearly or according to recommendations, and advised to  stay away from smoking. I have recommended yearly flu vaccine and pneumonia vaccine at least every 5 years; he is advised on exercise of upper body plus stationary devices due to his limitation from left BKA,  and  sleep for at least 7 hours a day.  - I advised patient to maintain close follow up with Vidal Schwalbe, MD for primary care needs.  - Patient Care Time Today:  25 min, of which >50% was spent in  counseling and the rest reviewing his  current and  previous labs/studies, previous treatments, his blood glucose readings, and medications' doses and developing a plan for long-term care based on the latest recommendations for standards of care.   Hiro Nickell participated in the discussions, expressed understanding, and voiced agreement with the above plans.  All questions were answered to his satisfaction. he is encouraged to contact clinic should he have any questions or concerns prior to his return visit.   Follow up plan: - Return in about 4 months (around 03/03/2019) for Follow up with Pre-visit Labs, Meter, and Logs.  Glade Lloyd, MD [facility] [address] [place], [place] [address] Phone: [phone]  Fax: [phone]    10/31/2018, 4:39 PM  This note was partially dictated with voice recognition software. Similar sounding words can be transcribed inadequately or may not  be corrected upon review.

## 2018-11-21 NOTE — Patient Instructions (Signed)
[name]  11/21/2018     @PREFPERIOPPHARMACY @   Your procedure is scheduled on  11/28/2018.  Report to [facility] at  845  A.M.  Call this number if you have problems the morning of surgery:  [phone]   Remember:  Follow the diet and prep instructions given to you by Dr Roseanne Kaufman office.                          Take these medicines the morning of surgery with A SIP OF WATER amlodpine, metoprolol, protonix. Take 1/2 of your night time medication the night before your procedure. DO NOT take any medications for diabetes the morning of your procedure.    Do not wear jewelry, make-up or nail polish.  Do not wear lotions, powders, or perfumes. Please wear deodorant and brush your teeth.  Do not shave 48 hours prior to surgery.  Men may shave face and neck.  Do not bring valuables to the hospital.  [facility] is not responsible for any belongings or valuables.  Contacts, dentures or bridgework may not be worn into surgery.  Leave your suitcase in the car.  After surgery it may be brought to your room.  For patients admitted to the hospital, discharge time will be determined by your treatment team.  Patients discharged the day of surgery will not be allowed to drive home.   Name and phone number of your driver:   family Special instructions:  None  Please read over the following fact sheets that you were given. Anesthesia Post-op Instructions and Care and Recovery After Surgery       Colonoscopy, Adult, Care After This sheet gives you information about how to care for yourself after your procedure. Your health care provider may also give you more specific instructions. If you have problems or questions, contact your health care provider. What can I expect after the procedure? After the procedure, it is common to have:  A small amount of blood in your stool for 24 hours after the procedure.  Some gas.  Mild abdominal cramping or bloating. Follow these  instructions at home: General instructions  For the first 24 hours after the procedure: ? Do not drive or use machinery. ? Do not sign important documents. ? Do not drink alcohol. ? Do your regular daily activities at a slower pace than normal. ? Eat soft, easy-to-digest foods.  Take over-the-counter or prescription medicines only as told by your health care provider. Relieving cramping and bloating   Try walking around when you have cramps or feel bloated.  Apply heat to your abdomen as told by your health care provider. Use a heat source that your health care provider recommends, such as a moist heat pack or a heating pad. ? Place a towel between your skin and the heat source. ? Leave the heat on for 20-30 minutes. ? Remove the heat if your skin turns bright red. This is especially important if you are unable to feel pain, heat, or cold. You may have a greater risk of getting burned. Eating and drinking   Drink enough fluid to keep your urine pale yellow.  Resume your normal diet as instructed by your health care provider. Avoid heavy or fried foods that are hard to digest.  Avoid drinking alcohol for as long as instructed by your health care provider. Contact a health care provider if:  You have blood in your stool  2-3 days after the procedure. Get help right away if:  You have more than a small spotting of blood in your stool.  You pass large blood clots in your stool.  Your abdomen is swollen.  You have nausea or vomiting.  You have a fever.  You have increasing abdominal pain that is not relieved with medicine. Summary  After the procedure, it is common to have a small amount of blood in your stool. You may also have mild abdominal cramping and bloating.  For the first 24 hours after the procedure, do not drive or use machinery, sign important documents, or drink alcohol.  Contact your health care provider if you have a lot of blood in your stool, nausea or  vomiting, a fever, or increased abdominal pain. This information is not intended to replace advice given to you by your health care provider. Make sure you discuss any questions you have with your health care provider. Document Released: 11/02/2003 Document Revised: 01/10/2017 Document Reviewed: 06/01/2015 Elsevier Patient Education  2020 [place] After These instructions provide you with information about caring for yourself after your procedure. Your health care provider may also give you more specific instructions. Your treatment has been planned according to current medical practices, but problems sometimes occur. Call your health care provider if you have any problems or questions after your procedure. What can I expect after the procedure? After your procedure, you may:  Feel sleepy for several hours.  Feel clumsy and have poor balance for several hours.  Feel forgetful about what happened after the procedure.  Have poor judgment for several hours.  Feel nauseous or vomit.  Have a sore throat if you had a breathing tube during the procedure. Follow these instructions at home: For at least 24 hours after the procedure:      Have a responsible adult stay with you. It is important to have someone help care for you until you are awake and alert.  Rest as needed.  Do not: ? Participate in activities in which you could fall or become injured. ? Drive. ? Use heavy machinery. ? Drink alcohol. ? Take sleeping pills or medicines that cause drowsiness. ? Make important decisions or sign legal documents. ? Take care of children on your own. Eating and drinking  Follow the diet that is recommended by your health care provider.  If you vomit, drink water, juice, or soup when you can drink without vomiting.  Make sure you have little or no nausea before eating solid foods. General instructions  Take over-the-counter and prescription  medicines only as told by your health care provider.  If you have sleep apnea, surgery and certain medicines can increase your risk for breathing problems. Follow instructions from your health care provider about wearing your sleep device: ? Anytime you are sleeping, including during daytime naps. ? While taking prescription pain medicines, sleeping medicines, or medicines that make you drowsy.  If you smoke, do not smoke without supervision.  Keep all follow-up visits as told by your health care provider. This is important. Contact a health care provider if:  You keep feeling nauseous or you keep vomiting.  You feel light-headed.  You develop a rash.  You have a fever. Get help right away if:  You have trouble breathing. Summary  For several hours after your procedure, you may feel sleepy and have poor judgment.  Have a responsible adult stay with you for at least 24 hours or until  you are awake and alert. This information is not intended to replace advice given to you by your health care provider. Make sure you discuss any questions you have with your health care provider. Document Released: 07/11/2015 Document Revised: 06/18/2017 Document Reviewed: 07/11/2015 Elsevier Patient Education  2020 [employer].

## 2018-11-25 ENCOUNTER — Encounter (HOSPITAL_COMMUNITY)
Admission: RE | Admit: 2018-11-25 | Discharge: 2018-11-25 | Disposition: A | Discharge status: Home / Self Care | Payer: Medicaid Other | Source: Ambulatory Visit | Attending: Internal Medicine | Admitting: Internal Medicine

## 2018-11-25 ENCOUNTER — Encounter (HOSPITAL_COMMUNITY): Payer: Self-pay

## 2018-11-25 ENCOUNTER — Other Ambulatory Visit (HOSPITAL_COMMUNITY)
Admission: RE | Admit: 2018-11-25 | Discharge: 2018-11-25 | Disposition: A | Discharge status: Home / Self Care | Payer: Medicaid Other | Source: Ambulatory Visit | Attending: Internal Medicine | Admitting: Internal Medicine

## 2018-11-25 ENCOUNTER — Other Ambulatory Visit: Payer: Self-pay

## 2018-11-25 DIAGNOSIS — Z01812 Encounter for preprocedural laboratory examination: Secondary | ICD-10-CM | POA: Insufficient documentation

## 2018-11-25 DIAGNOSIS — Z20828 Contact with and (suspected) exposure to other viral communicable diseases: Secondary | ICD-10-CM | POA: Insufficient documentation

## 2018-11-25 HISTORY — DX: Gastro-esophageal reflux disease without esophagitis: K21.9

## 2018-11-26 LAB — SARS CORONAVIRUS 2 (TAT 6-24 HRS): SARS Coronavirus 2: NEGATIVE

## 2018-11-28 ENCOUNTER — Encounter (HOSPITAL_COMMUNITY): Payer: Self-pay | Admitting: *Deleted

## 2018-11-28 ENCOUNTER — Ambulatory Visit (HOSPITAL_COMMUNITY): Payer: Medicaid Other | Admitting: Anesthesiology

## 2018-11-28 ENCOUNTER — Other Ambulatory Visit: Payer: Self-pay

## 2018-11-28 ENCOUNTER — Ambulatory Visit (HOSPITAL_COMMUNITY)
Admission: RE | Admit: 2018-11-28 | Discharge: 2018-11-28 | Disposition: A | Discharge status: Home / Self Care | Payer: Medicaid Other | Attending: Internal Medicine | Admitting: Internal Medicine

## 2018-11-28 ENCOUNTER — Encounter (HOSPITAL_COMMUNITY)
Admission: RE | Disposition: A | Discharge status: Home / Self Care | Payer: Self-pay | Source: Home / Self Care | Attending: Internal Medicine

## 2018-11-28 DIAGNOSIS — Z79899 Other long term (current) drug therapy: Secondary | ICD-10-CM | POA: Diagnosis not present

## 2018-11-28 DIAGNOSIS — Z794 Long term (current) use of insulin: Secondary | ICD-10-CM | POA: Insufficient documentation

## 2018-11-28 DIAGNOSIS — Z7982 Long term (current) use of aspirin: Secondary | ICD-10-CM | POA: Insufficient documentation

## 2018-11-28 DIAGNOSIS — D125 Benign neoplasm of sigmoid colon: Secondary | ICD-10-CM | POA: Diagnosis not present

## 2018-11-28 DIAGNOSIS — E1151 Type 2 diabetes mellitus with diabetic peripheral angiopathy without gangrene: Secondary | ICD-10-CM | POA: Diagnosis not present

## 2018-11-28 DIAGNOSIS — Z1211 Encounter for screening for malignant neoplasm of colon: Secondary | ICD-10-CM | POA: Diagnosis present

## 2018-11-28 DIAGNOSIS — I509 Heart failure, unspecified: Secondary | ICD-10-CM | POA: Diagnosis not present

## 2018-11-28 DIAGNOSIS — I110 Hypertensive heart disease with heart failure: Secondary | ICD-10-CM | POA: Insufficient documentation

## 2018-11-28 DIAGNOSIS — D122 Benign neoplasm of ascending colon: Secondary | ICD-10-CM | POA: Diagnosis not present

## 2018-11-28 DIAGNOSIS — K219 Gastro-esophageal reflux disease without esophagitis: Secondary | ICD-10-CM | POA: Insufficient documentation

## 2018-11-28 DIAGNOSIS — Z87891 Personal history of nicotine dependence: Secondary | ICD-10-CM | POA: Insufficient documentation

## 2018-11-28 DIAGNOSIS — Z89512 Acquired absence of left leg below knee: Secondary | ICD-10-CM | POA: Insufficient documentation

## 2018-11-28 DIAGNOSIS — K635 Polyp of colon: Secondary | ICD-10-CM

## 2018-11-28 DIAGNOSIS — M1990 Unspecified osteoarthritis, unspecified site: Secondary | ICD-10-CM | POA: Insufficient documentation

## 2018-11-28 DIAGNOSIS — M109 Gout, unspecified: Secondary | ICD-10-CM | POA: Diagnosis not present

## 2018-11-28 DIAGNOSIS — E785 Hyperlipidemia, unspecified: Secondary | ICD-10-CM | POA: Insufficient documentation

## 2018-11-28 HISTORY — PX: POLYPECTOMY: SHX5525

## 2018-11-28 HISTORY — PX: COLONOSCOPY WITH PROPOFOL: SHX5780

## 2018-11-28 LAB — GLUCOSE, CAPILLARY: Glucose-Capillary: 187 mg/dL — ABNORMAL HIGH (ref 70–99)

## 2018-11-28 SURGERY — COLONOSCOPY WITH PROPOFOL
Anesthesia: General

## 2018-11-28 MED ORDER — PROPOFOL 10 MG/ML IV BOLUS
INTRAVENOUS | Status: AC
  Filled 2018-11-28: qty 40

## 2018-11-28 MED ORDER — LACTATED RINGERS IV SOLN
Freq: Once | INTRAVENOUS | Status: AC
  Administered 2018-11-28: 10:00:00 via INTRAVENOUS

## 2018-11-28 MED ORDER — PROPOFOL 10 MG/ML IV BOLUS
INTRAVENOUS | Status: DC | PRN
  Administered 2018-11-28: 10 mg via INTRAVENOUS

## 2018-11-28 MED ORDER — PROPOFOL 500 MG/50ML IV EMUL
INTRAVENOUS | Status: DC | PRN
  Administered 2018-11-28: 11:00:00 via INTRAVENOUS
  Administered 2018-11-28: 100 ug/kg/min via INTRAVENOUS

## 2018-11-28 MED ORDER — KETAMINE HCL 10 MG/ML IJ SOLN
INTRAMUSCULAR | Status: DC | PRN
  Administered 2018-11-28: 10 mg via INTRAVENOUS
  Administered 2018-11-28: 5 mg via INTRAVENOUS
  Administered 2018-11-28 (×2): 10 mg via INTRAVENOUS

## 2018-11-28 MED ORDER — KETAMINE HCL 50 MG/5ML IJ SOSY
PREFILLED_SYRINGE | INTRAMUSCULAR | Status: AC
  Filled 2018-11-28: qty 5

## 2018-11-28 MED ORDER — LACTATED RINGERS IV SOLN
INTRAVENOUS | Status: DC | PRN
  Administered 2018-11-28: 10:00:00 via INTRAVENOUS

## 2018-11-28 NOTE — Discharge Instructions (Signed)
Colonoscopy Discharge Instructions  Read the instructions outlined below and refer to this sheet in the next few weeks. These discharge instructions provide you with general information on caring for yourself after you leave the hospital. Your doctor may also give you specific instructions. While your treatment has been planned according to the most current medical practices available, unavoidable complications occasionally occur. If you have any problems or questions after discharge, call Dr. Gala Romney at [phone]. ACTIVITY  You may resume your regular activity, but move at a slower pace for the next 24 hours.   Take frequent rest periods for the next 24 hours.   Walking will help get rid of the air and reduce the bloated feeling in your belly (abdomen).   No driving for 24 hours (because of the medicine (anesthesia) used during the test).    Do not sign any important legal documents or operate any machinery for 24 hours (because of the anesthesia used during the test).  NUTRITION  Drink plenty of fluids.   You may resume your normal diet as instructed by your doctor.   Begin with a light meal and progress to your normal diet. Heavy or fried foods are harder to digest and may make you feel sick to your stomach (nauseated).   Avoid alcoholic beverages for 24 hours or as instructed.  MEDICATIONS  You may resume your normal medications unless your doctor tells you otherwise.  WHAT YOU CAN EXPECT TODAY  Some feelings of bloating in the abdomen.   Passage of more gas than usual.   Spotting of blood in your stool or on the toilet paper.  IF YOU HAD POLYPS REMOVED DURING THE COLONOSCOPY:  No aspirin products for 7 days or as instructed.   No alcohol for 7 days or as instructed.   Eat a soft diet for the next 24 hours.  FINDING OUT THE RESULTS OF YOUR TEST Not all test results are available during your visit. If your test results are not back during the visit, make an appointment  with your caregiver to find out the results. Do not assume everything is normal if you have not heard from your caregiver or the medical facility. It is important for you to follow up on all of your test results.  SEEK IMMEDIATE MEDICAL ATTENTION IF:  You have more than a spotting of blood in your stool.   Your belly is swollen (abdominal distention).   You are nauseated or vomiting.   You have a temperature over 101.   You have abdominal pain or discomfort that is severe or gets worse throughout the day.    Colon Polyps  Polyps are tissue growths inside the body. Polyps can grow in many places, including the large intestine (colon). A polyp may be a round bump or a mushroom-shaped growth. You could have one polyp or several. Most colon polyps are noncancerous (benign). However, some colon polyps can become cancerous over time. Finding and removing the polyps early can help prevent this. What are the causes? The exact cause of colon polyps is not known. What increases the risk? You are more likely to develop this condition if you:  Have a family history of colon cancer or colon polyps.  Are older than 94 or older than 45 if you are African American.  Have inflammatory bowel disease, such as ulcerative colitis or Crohn's disease.  Have certain hereditary conditions, such as: ? Familial adenomatous polyposis. ? Lynch syndrome. ? Turcot syndrome. ? Peutz-Jeghers syndrome.  Are  overweight.  Smoke cigarettes.  Do not get enough exercise.  Drink too much alcohol.  Eat a diet that is high in fat and red meat and low in fiber.  Had childhood cancer that was treated with abdominal radiation. What are the signs or symptoms? Most polyps do not cause symptoms. If you have symptoms, they may include:  Blood coming from your rectum when having a bowel movement.  Blood in your stool. The stool may look dark red or black.  Abdominal pain.  A change in bowel habits, such as  constipation or diarrhea. How is this diagnosed? This condition is diagnosed with a colonoscopy. This is a procedure in which a lighted, flexible scope is inserted into the anus and then passed into the colon to examine the area. Polyps are sometimes found when a colonoscopy is done as part of routine cancer screening tests. How is this treated? Treatment for this condition involves removing any polyps that are found. Most polyps can be removed during a colonoscopy. Those polyps will then be tested for cancer. Additional treatment may be needed depending on the results of testing. Follow these instructions at home: Lifestyle  Maintain a healthy weight, or lose weight if recommended by your health care provider.  Exercise every day or as told by your health care provider.  Do not use any products that contain nicotine or tobacco, such as cigarettes and e-cigarettes. If you need help quitting, ask your health care provider.  If you drink alcohol, limit how much you have: ? 0-1 drink a day for women. ? 0-2 drinks a day for men.  Be aware of how much alcohol is in your drink. In the [place] one drink equals one 12 oz bottle of beer (355 mL), one 5 oz glass of wine (148 mL), or one 1 oz shot of hard liquor (44 mL). Eating and drinking   Eat foods that are high in fiber, such as fruits, vegetables, and whole grains.  Eat foods that are high in calcium and vitamin D, such as milk, cheese, yogurt, eggs, liver, fish, and broccoli.  Limit foods that are high in fat, such as fried foods and desserts.  Limit the amount of red meat and processed meat you eat, such as hot dogs, sausage, bacon, and lunch meats. General instructions  Keep all follow-up visits as told by your health care provider. This is important. ? This includes having regularly scheduled colonoscopies. ? Talk to your health care provider about when you need a colonoscopy. Contact a health care provider if:  You have new or  worsening bleeding during a bowel movement.  You have new or increased blood in your stool.  You have a change in bowel habits.  You lose weight for no known reason. Summary  Polyps are tissue growths inside the body. Polyps can grow in many places, including the colon.  Most colon polyps are noncancerous (benign), but some can become cancerous over time.  This condition is diagnosed with a colonoscopy.  Treatment for this condition involves removing any polyps that are found. Most polyps can be removed during a colonoscopy. This information is not intended to replace advice given to you by your health care provider. Make sure you discuss any questions you have with your health care provider. Document Released: 12/15/2003 Document Revised: 07/05/2017 Document Reviewed: 07/05/2017 Elsevier Patient Education  [employer].   Colon polyp information provided  Further recommendations to follow pending review of pathology report  At patient request,  discussed results with Melissa Juarez at [phone]

## 2018-11-28 NOTE — Op Note (Signed)
[facility] Patient Name: [name] Procedure Date: 11/28/2018 10:15 AM MRN: [id number] Date of Birth: 1960/10/12 Attending MD: Norvel Richards , MD CSN: [id number] Age: 58 Admit Type: Outpatient Procedure:                Colonoscopy Indications:              Screening for colorectal malignant neoplasm Providers:                Norvel Richards, MD, Hinton Rao, RN, Nelma Rothman, Technician Referring MD:             Vidal Schwalbe, MD Medicines:                Propofol per Anesthesia Complications:            No immediate complications. Estimated Blood Loss:     Estimated blood loss was minimal. Procedure:                Pre-Anesthesia Assessment:                           - Prior to the procedure, a History and Physical                            was performed, and patient medications and                            allergies were reviewed. The patient's tolerance of                            previous anesthesia was also reviewed. The risks                            and benefits of the procedure and the sedation                            options and risks were discussed with the patient.                            All questions were answered, and informed consent                            was obtained. Prior Anticoagulants: The patient has                            taken no previous anticoagulant or antiplatelet                            agents. ASA Grade Assessment: III - A patient with                            severe systemic disease. After reviewing the risks  and benefits, the patient was deemed in                            satisfactory condition to undergo the procedure.                           After obtaining informed consent, the colonoscope                            was passed under direct vision. Throughout the                            procedure, the patient's blood pressure, pulse, and                             oxygen saturations were monitored continuously. The                            [id number] scope was introduced through                            the anus and advanced to the the cecum, identified                            by appendiceal orifice and ileocecal valve. The                            ileocecal valve, appendiceal orifice, and rectum                            were photographed. The entire colon was well                            visualized. The colonoscopy was aborted. Scope In: 10:36:42 AM Scope Out: 11:04:49 AM Scope Withdrawal Time: 0 hours 14 minutes 46 seconds  Total Procedure Duration: 0 hours 28 minutes 7 seconds  Findings:      The perianal and digital rectal examinations were normal.      Five semi-pedunculated polyps were found in the sigmoid colon and       ascending colon. The polyps were 6 to 10 mm in size. These polyps were       removed with a cold snare. Resection and retrieval were complete.       Estimated blood loss was minimal.      The exam was otherwise without abnormality on direct and retroflexion       views. Impression:               - The procedure was aborted.                           - Five 6 to 10 mm polyps in the sigmoid colon and                            in the ascending colon, removed with a cold snare.  Resected and retrieved.                           - The examination was otherwise normal on direct                            and retroflexion views. Moderate Sedation:      Moderate (conscious) sedation was personally administered by an       anesthesia professional. The following parameters were monitored: oxygen       saturation, heart rate, blood pressure, respiratory rate, EKG, adequacy       of pulmonary ventilation, and response to care. Recommendation:           - Patient has a contact number available for                            emergencies. The signs and  symptoms of potential                            delayed complications were discussed with the                            patient. Return to normal activities tomorrow.                            Written discharge instructions were provided to the                            patient.                           - Resume previous diet.                           - Continue present medications.                           - Repeat colonoscopy date to be determined after                            pending pathology results are reviewed for                            surveillance based on pathology results.                           - Return to GI office (date not yet determined). Procedure Code(s):        --- Professional ---                           [phone], 52, Colonoscopy, flexible; with removal of                            tumor(s), polyp(s), or other lesion(s) by snare  technique Diagnosis Code(s):        --- Professional ---                           Z12.11, Encounter for screening for malignant                            neoplasm of colon                           Z53.8, Procedure and treatment not carried out for                            other reasons                           K63.5, Polyp of colon CPT copyright 2019 American Medical Association. All rights reserved. The codes documented in this report are preliminary and upon coder review may  be revised to meet current compliance requirements. Cristopher Estimable. Samira Acero, MD Norvel Richards, MD 11/28/2018 11:11:25 AM This report has been signed electronically. Number of Addenda: 0

## 2018-11-28 NOTE — Transfer of Care (Signed)
Immediate Anesthesia Transfer of Care Note  Patient: [name]  Procedure(s) Performed: COLONOSCOPY WITH PROPOFOL (N/A ) POLYPECTOMY  Patient Location: PACU  Anesthesia Type:General  Level of Consciousness: drowsy and patient cooperative  Airway & Oxygen Therapy: Patient Spontanous Breathing  Post-op Assessment: Report given to RN, Post -op Vital signs reviewed and stable and Patient moving all extremities  Post vital signs: Reviewed and stable  Last Vitals:  Vitals Value Taken Time  BP    Temp    Pulse    Resp    SpO2      Last Pain:  Vitals:   11/28/18 0920  TempSrc: Oral  PainSc: 0-No pain      Patients Stated Pain Goal: 5 ([id number])  Complications: No apparent anesthesia complications

## 2018-11-28 NOTE — Anesthesia Postprocedure Evaluation (Signed)
Anesthesia Post Note  Patient: [name]  Procedure(s) Performed: COLONOSCOPY WITH PROPOFOL (N/A ) POLYPECTOMY  Patient location during evaluation: PACU Anesthesia Type: General Level of consciousness: awake and patient cooperative Pain management: pain level controlled Vital Signs Assessment: post-procedure vital signs reviewed and stable Respiratory status: respiratory function stable and spontaneous breathing Cardiovascular status: blood pressure returned to baseline Postop Assessment: no apparent nausea or vomiting Anesthetic complications: no     Last Vitals:  Vitals:   11/28/18 1115 11/28/18 1128  BP: 103/80 121/76  Pulse: 80 78  Resp: 11 (!) 25  Temp: 36.5 C   SpO2: 94% 95%    Last Pain:  Vitals:   11/28/18 1128  TempSrc:   PainSc: 0-No pain                 Jerzy Roepke J

## 2018-11-28 NOTE — H&P (Signed)
@LOGO @   Primary Care Physician:  Vidal Schwalbe, MD Primary Gastroenterologist:  Dr. Gala Romney  Pre-Procedure History & Physical: HPI:  [name] is a 58 y.o. male is here for a screening colonoscopy.  No bowel symptoms.  No prior  colonoscopy  Past Medical History:  Diagnosis Date  . Diabetes mellitus without complication ([place])   . GERD (gastroesophageal reflux disease)   . Gout   . Hyperlipidemia   . Hypertension   . Insomnia   . Osteoarthritis     Past Surgical History:  Procedure Laterality Date  . AMPUTATION Left 10/05/2017   Procedure: LEFT BELOW KNEE AMPUTATION;  Surgeon: [name], MD;  Location: [place];  Service: Orthopedics;  Laterality: Left;  . JOINT REPLACEMENT     right knee  . LEG SURGERY      Prior to Admission medications   Medication Sig Start Date End Date Taking? Authorizing Provider  acetaminophen (TYLENOL) 325 MG tablet Take 2 tablets (650 mg total) by mouth every 6 (six) hours. 10/10/17  Yes Love, Ivan Anchors, PA-C  amLODipine (NORVASC) 10 MG tablet Take 1 tablet (10 mg total) by mouth daily. 10/18/17  Yes Love, Ivan Anchors, PA-C  aspirin 81 MG chewable tablet Chew 81 mg by mouth daily.   Yes [provider]  Cholecalciferol (VITAMIN D) 125 MCG (5000 UT) CAPS TAKE (1) CAPSULE BY MOUTH ONCE DAILY. 10/10/18  Yes Nida, Marella Chimes, MD  glipiZIDE (GLUCOTROL XL) 5 MG 24 hr tablet Take 1 tablet (5 mg total) by mouth daily with breakfast. 10/31/18  Yes Nida, Marella Chimes, MD  insulin aspart (NOVOLOG FLEXPEN) 100 UNIT/ML FlexPen Inject 15-21 Units into the skin 3 (three) times daily before meals. 06/06/18  Yes Nida, Marella Chimes, MD  insulin glargine (LANTUS) 100 UNIT/ML injection Inject 1 mL (100 Units total) into the skin daily. Lantus Solostar 10/31/18  Yes Cassandria Anger, MD  LANTUS SOLOSTAR 100 UNIT/ML Solostar Pen INJECT 80 UNITS S.Q. AT BEDTIME. 09/11/18  Yes Nida, Marella Chimes, MD  Melatonin 3 MG TABS Take 1.5 tablets (4.5 mg total)  by mouth at bedtime as needed (sleep). 10/18/17  Yes Love, Ivan Anchors, PA-C  metFORMIN (GLUCOPHAGE-XR) 500 MG 24 hr tablet TAKE 1 TABLET BY MOUTH TWICE DAILY AFTER A MEAL 10/10/18  Yes Nida, Marella Chimes, MD  metoprolol succinate (TOPROL-XL) 25 MG 24 hr tablet Take 1 tablet (25 mg total) by mouth daily. 10/18/17  Yes Love, Ivan Anchors, PA-C  Multiple Vitamin (MULTIVITAMIN WITH MINERALS) TABS tablet Take 1 tablet by mouth daily. 10/19/17  Yes Love, Ivan Anchors, PA-C  pantoprazole (PROTONIX) 40 MG tablet Take 1 tablet (40 mg total) by mouth daily. 10/18/17  Yes Love, Ivan Anchors, PA-C  polyethylene glycol (MIRALAX / GLYCOLAX) packet Take 17 g by mouth daily. Patient taking differently: Take 17 g by mouth daily as needed for moderate constipation.  10/18/17  Yes Love, Ivan Anchors, PA-C  rosuvastatin (CRESTOR) 5 MG tablet Take 5 mg by mouth daily.   Yes [provider]  ULTICARE MINI PEN NEEDLES 31G X 6 MM MISC USE AS DIRECTED 08/07/18  Yes Nida, Marella Chimes, MD    Allergies as of 05/27/2018  . (No Known Allergies)    Family History  Problem Relation Age of Onset  . Diabetes Mother   . Colon cancer Maternal Uncle        greater than age 62  . Cancer Other        multiple family memebers on mother's side  of family.     Social History   Socioeconomic History  . Marital status: Single    Spouse name: Not on file  . Number of children: Not on file  . Years of education: Not on file  . Highest education level: Not on file  Occupational History  . Not on file  Social Needs  . Financial resource strain: Not on file  . Food insecurity    Worry: Not on file    Inability: Not on file  . Transportation needs    Medical: Not on file    Non-medical: Not on file  Tobacco Use  . Smoking status: Former Smoker    Packs/day: 1.00    Years: 50.00    Pack years: 50.00    Types: Cigarettes    Quit date: 10/01/2017    Years since quitting: 1.1  . Smokeless tobacco: Never Used  Substance and Sexual  Activity  . 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 Drug use: No  . Sexual activity: Not Currently    Birth control/protection: None  Lifestyle  . Physical activity    Days per week: Not on file    Minutes per session: Not on file  . Stress: Not on file  Relationships  . Social Herbalist on phone: Not on file    Gets together: Not on file    Attends religious service: Not on file    Active member of club or organization: Not on file    Attends meetings of clubs or organizations: Not on file    Relationship status: Not on file  . Intimate partner violence    Fear of current or ex partner: Not on file    Emotionally abused: Not on file    Physically abused: Not on file    Forced sexual activity: Not on file  Other Topics Concern  . Not on file  Social History Narrative  . Not on file    Review of Systems: See HPI, otherwise negative ROS  Physical Exam: BP 127/80   Pulse 77   Temp 98.2 F (36.8 C) (Oral)   Resp (!) 22   SpO2 97%  General:   Alert,  Well-developed, well-nourished, pleasant and cooperative in NAD Lungs:  Clear throughout to auscultation.   No wheezes, crackles, or rhonchi. No acute distress. Heart:  Regular rate and rhythm; no murmurs, clicks, rubs,  or gallops. Abdomen:  Soft, nontender and nondistended. No masses, hepatosplenomegaly or hernias noted. Normal bowel sounds, without guarding, and without rebound.    Impression/Plan: [name] is now here to undergo a screening colonoscopy.  First ever average rescreening examination  Risks, benefits, limitations, imponderables and alternatives regarding colonoscopy have been reviewed with the patient. Questions have been answered. All parties agreeable.     Notice:  This dictation was prepared with Dragon dictation along with smaller phrase technology. Any transcriptional errors that result from this process are unintentional and may not be  corrected upon review.

## 2018-11-28 NOTE — Anesthesia Preprocedure Evaluation (Addendum)
Anesthesia Evaluation  Patient identified by MRN, date of birth, ID band Patient awake    Reviewed: Allergy & Precautions, NPO status , Patient's Chart, lab work & pertinent test results, reviewed documented beta blocker date and time   Airway Mallampati: III   Neck ROM: Full   Comment: Possible difficult airway  Dental  (+) Missing, Loose, Dental Advisory Given, Poor Dentition,    Pulmonary former smoker,    Pulmonary exam normal breath sounds clear to auscultation       Cardiovascular Exercise Tolerance: Poor hypertension (took metoprolol - 11/27/18), Pt. on medications and Pt. on home beta blockers + Peripheral Vascular Disease, +CHF and + DOE  Normal cardiovascular exam Rhythm:Regular Rate:Normal + Systolic murmurs    Neuro/Psych    GI/Hepatic GERD  ,(+)     substance abuse  alcohol use, cocaine use and marijuana use,   Endo/Other  diabetes, Poorly Controlled, Type 2, Insulin Dependent, Oral Hypoglycemic Agents  Renal/GU      Musculoskeletal  (+) Arthritis ,   Abdominal   Peds  (+) ventilator required Hematology  (+) anemia ,   Anesthesia Other Findings   Reproductive/Obstetrics                            Anesthesia Physical Anesthesia Plan  ASA: IV  Anesthesia Plan: General   Post-op Pain Management:    Induction:   PONV Risk Score and Plan:   Airway Management Planned: Natural Airway, Nasal Cannula and Simple Face Mask  Additional Equipment:   Intra-op Plan:   Post-operative Plan:   Informed Consent: I have reviewed the patients History and Physical, chart, labs and discussed the procedure including the risks, benefits and alternatives for the proposed anesthesia with the patient or authorized representative who has indicated his/her understanding and acceptance.     Dental advisory given  Plan Discussed with: CRNA  Anesthesia Plan Comments:         Anesthesia Quick Evaluation

## 2018-11-30 ENCOUNTER — Encounter: Payer: Self-pay | Admitting: Internal Medicine

## 2018-12-02 ENCOUNTER — Encounter (HOSPITAL_COMMUNITY): Payer: Self-pay | Admitting: Internal Medicine

## 2019-01-10 ENCOUNTER — Other Ambulatory Visit: Payer: Self-pay | Admitting: "Endocrinology

## 2019-01-21 ENCOUNTER — Other Ambulatory Visit: Payer: Self-pay | Admitting: "Endocrinology

## 2019-02-07 ENCOUNTER — Other Ambulatory Visit: Payer: Self-pay | Admitting: "Endocrinology

## 2019-02-07 DIAGNOSIS — E1159 Type 2 diabetes mellitus with other circulatory complications: Secondary | ICD-10-CM

## 2019-03-06 ENCOUNTER — Ambulatory Visit: Payer: Medicaid Other | Admitting: "Endocrinology

## 2019-04-02 ENCOUNTER — Other Ambulatory Visit: Payer: Self-pay | Admitting: "Endocrinology

## 2019-04-02 DIAGNOSIS — E1159 Type 2 diabetes mellitus with other circulatory complications: Secondary | ICD-10-CM

## 2019-04-21 ENCOUNTER — Other Ambulatory Visit: Payer: Self-pay | Admitting: "Endocrinology

## 2019-05-02 ENCOUNTER — Other Ambulatory Visit: Payer: Self-pay | Admitting: "Endocrinology

## 2019-05-02 DIAGNOSIS — E1159 Type 2 diabetes mellitus with other circulatory complications: Secondary | ICD-10-CM

## 2019-06-02 ENCOUNTER — Other Ambulatory Visit: Payer: Self-pay | Admitting: "Endocrinology

## 2019-07-02 NOTE — Telephone Encounter (Signed)
disregard

## 2019-07-03 ENCOUNTER — Other Ambulatory Visit: Payer: Self-pay | Admitting: "Endocrinology

## 2019-07-03 DIAGNOSIS — E1159 Type 2 diabetes mellitus with other circulatory complications: Secondary | ICD-10-CM

## 2019-07-09 ENCOUNTER — Other Ambulatory Visit: Payer: Self-pay | Admitting: "Endocrinology

## 2019-07-09 ENCOUNTER — Telehealth: Payer: Self-pay | Admitting: "Endocrinology

## 2019-07-09 DIAGNOSIS — E1159 Type 2 diabetes mellitus with other circulatory complications: Secondary | ICD-10-CM

## 2019-07-09 MED ORDER — NOVOLOG FLEXPEN 100 UNIT/ML ~~LOC~~ SOPN: PEN_INJECTOR | SUBCUTANEOUS | 0 refills | Status: DC

## 2019-07-09 NOTE — Telephone Encounter (Signed)
Can you update lab order? He is out of insulin. He will schedule after he gets labs. He wants to know can you refill his novolog until then

## 2019-07-09 NOTE — Telephone Encounter (Signed)
Pt made aware, he is going to have blood drawn for labs tomorrow. Please call pt back to schedule appointment.

## 2019-07-09 NOTE — Telephone Encounter (Signed)
He will get a box of Novolog  until his return visit. I will order labs .

## 2019-07-10 NOTE — Telephone Encounter (Signed)
Tried to reach pt , faxed lab order to [facility] [address]

## 2019-07-10 NOTE — Telephone Encounter (Signed)
Left VM to call and schedule an appt

## 2019-07-11 LAB — LIPID PANEL
Cholesterol: 107 mg/dL (ref <200)
HDL: 19 mg/dL — ABNORMAL LOW (ref >=40)
Non-HDL Cholesterol (Calc): 88 mg/dL (calc) (ref <130)
Total CHOL/HDL Ratio: 5.6 (calc) — ABNORMAL HIGH (ref <5.0)
Triglycerides: 446 mg/dL — ABNORMAL HIGH (ref <150)

## 2019-07-11 LAB — COMPLETE METABOLIC PANEL WITH GFR
AG Ratio: 1.2 (calc) (ref 1.0–2.5)
ALT: 17 U/L (ref 9–46)
AST: 13 U/L (ref 10–35)
Albumin: 4.3 g/dL (ref 3.6–5.1)
Alkaline phosphatase (APISO): 88 U/L (ref 35–144)
BUN: 20 mg/dL (ref 7–25)
CO2: 25 mmol/L (ref 20–32)
Calcium: 9.7 mg/dL (ref 8.6–10.3)
Chloride: 103 mmol/L (ref 98–110)
Creat: 0.84 mg/dL (ref 0.70–1.25)
GFR, Est African American: 110 mL/min/{1.73_m2} (ref >=60)
GFR, Est Non African American: 95 mL/min/{1.73_m2} (ref >=60)
Globulin: 3.7 g/dL (calc) (ref 1.9–3.7)
Glucose, Bld: 201 mg/dL — ABNORMAL HIGH (ref 65–99)
Potassium: 4.4 mmol/L (ref 3.5–5.3)
Sodium: 136 mmol/L (ref 135–146)
Total Bilirubin: 0.2 mg/dL (ref 0.2–1.2)
Total Protein: 8 g/dL (ref 6.1–8.1)

## 2019-07-11 LAB — MICROALBUMIN / CREATININE URINE RATIO
Creatinine, Urine: 121 mg/dL (ref 20–320)
Microalb Creat Ratio: 3 mcg/mg creat (ref <30)
Microalb, Ur: 0.4 mg/dL

## 2019-07-11 LAB — TSH: TSH: 4.59 mIU/L — ABNORMAL HIGH (ref 0.40–4.50)

## 2019-07-11 LAB — T4, FREE: Free T4: 1 ng/dL (ref 0.8–1.8)

## 2019-08-04 ENCOUNTER — Other Ambulatory Visit: Payer: Self-pay | Admitting: "Endocrinology

## 2019-08-04 DIAGNOSIS — E1159 Type 2 diabetes mellitus with other circulatory complications: Secondary | ICD-10-CM

## 2019-08-04 NOTE — Telephone Encounter (Signed)
Pt needs refills. Pharmacy sent through here. He is scheduled with dr nida this wednesday

## 2019-08-06 ENCOUNTER — Other Ambulatory Visit: Payer: Self-pay

## 2019-08-06 ENCOUNTER — Ambulatory Visit (INDEPENDENT_AMBULATORY_CARE_PROVIDER_SITE_OTHER): Payer: Medicaid Other | Admitting: "Endocrinology

## 2019-08-06 ENCOUNTER — Encounter: Payer: Self-pay | Admitting: "Endocrinology

## 2019-08-06 VITALS — BP 137/78 | HR 83 | Ht 67.0 in | Wt 250.8 lb

## 2019-08-06 DIAGNOSIS — E782 Mixed hyperlipidemia: Secondary | ICD-10-CM

## 2019-08-06 DIAGNOSIS — E559 Vitamin D deficiency, unspecified: Secondary | ICD-10-CM | POA: Diagnosis not present

## 2019-08-06 DIAGNOSIS — E1159 Type 2 diabetes mellitus with other circulatory complications: Secondary | ICD-10-CM | POA: Diagnosis not present

## 2019-08-06 DIAGNOSIS — I10 Essential (primary) hypertension: Secondary | ICD-10-CM

## 2019-08-06 LAB — HEMOGLOBIN A1C: Hemoglobin A1C: 12

## 2019-08-06 MED ORDER — ROSUVASTATIN CALCIUM 10 MG PO TABS: 10.0000 mg | ORAL_TABLET | Freq: Every day | ORAL | 1 refills | Status: DC

## 2019-08-06 NOTE — Progress Notes (Signed)
08/06/2019, 5:58 PM    Endocrinology follow-up note   Subjective:    Patient ID: [name], male    DOB: 08-05-1960.  [name] is being seen in follow-up for management of currently uncontrolled complicated symptomatic  Type 2 diabetes, hyperlipidemia, hypertension. PMD:   Nathan Schwalbe, MD.   Past Medical History:  Diagnosis Date  . Diabetes mellitus without complication ([place])   . GERD (gastroesophageal reflux disease)   . Gout   . Hyperlipidemia   . Hypertension   . Insomnia   . Osteoarthritis    Past Surgical History:  Procedure Laterality Date  . AMPUTATION Left 10/05/2017   Procedure: LEFT BELOW KNEE AMPUTATION;  Surgeon: [name], MD;  Location: [facility];  Service: Orthopedics;  Laterality: Left;  . COLONOSCOPY WITH PROPOFOL N/A 11/28/2018   Procedure: COLONOSCOPY WITH PROPOFOL;  Surgeon: Daneil Dolin, MD;  [facility];  Service: Endoscopy;  Laterality: N/A;  7:30am  . JOINT REPLACEMENT     right knee  . LEG SURGERY    . POLYPECTOMY  11/28/2018   Procedure: POLYPECTOMY;  Surgeon: Daneil Dolin, MD;  Location: [facility];  Service: Endoscopy;;  sigmoid polyp x 1, ascending colon x 5   Social History   Socioeconomic History  . Marital status: Single    Spouse name: Not on file  . Number of children: Not on file  . Years of education: Not on file  . Highest education level: Not on file  Occupational History  . Not on file  Tobacco Use  . Smoking status: Former Smoker    Packs/day: 1.00    Years: 50.00    Pack years: 50.00    Types: Cigarettes    Quit date: 10/01/2017    Years since quitting: 1.8  . Smokeless tobacco: Never Used  Substance and Sexual Activity  . 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 Drug use: No  . Sexual activity: Not Currently    Birth control/protection: None  Other Topics  Concern  . Not on file  Social History Narrative  . Not on file   Social Determinants of Health   Financial Resource Strain:   . Difficulty of Paying Living Expenses:   Food Insecurity:   . Worried About Charity fundraiser in the Last Year:   . Arboriculturist in the Last Year:   Transportation Needs:   . Film/video editor (Medical):   Marland Kitchen Lack of Transportation (Non-Medical):   Physical Activity:   . Days of Exercise per Week:   . Minutes of Exercise per Session:   Stress:   . Feeling of Stress :   Social Connections:   . Frequency of Communication with Friends and Family:   . Frequency of Social Gatherings with Friends and Family:   . Attends Religious Services:   . Active Member of Clubs or Organizations:   . Attends Archivist Meetings:   Marland Kitchen Marital Status:    Outpatient Encounter Medications as of 08/06/2019  Medication Sig  . ACCU-CHEK AVIVA PLUS test strip CHECK BLOOD SUGAR  4 TIMES DAILY.  Marland Kitchen acetaminophen (TYLENOL) 325 MG tablet Take 2 tablets (650 mg total) by mouth every 6 (six) hours.  Marland Kitchen amLODipine (NORVASC) 10 MG tablet Take 1 tablet (10 mg total) by mouth daily.  Marland Kitchen aspirin 81 MG chewable tablet Chew 81 mg by mouth daily.  . Cholecalciferol (VITAMIN D) 125 MCG (5000 UT) CAPS TAKE (1) CAPSULE BY MOUTH ONCE DAILY.  Marland Kitchen glipiZIDE (GLUCOTROL XL) 5 MG 24 hr tablet TAKE 1 TABLET BY MOUTH ONCE DAILY.  Marland Kitchen insulin aspart (NOVOLOG FLEXPEN) 100 UNIT/ML FlexPen INJECT 15-21 UNITS INTO THE SKIN THREE TIMES DAILY BEFORE MEALS.  Marland Kitchen LANTUS SOLOSTAR 100 UNIT/ML Solostar Pen INJECT 100 UNITS S.Q. ONCE DAILY.  . Melatonin 3 MG TABS Take 1.5 tablets (4.5 mg total) by mouth at bedtime as needed (sleep).  . metFORMIN (GLUCOPHAGE-XR) 500 MG 24 hr tablet TAKE 1 TABLET BY MOUTH TWICE DAILY AFTER A MEAL  . metoprolol succinate (TOPROL-XL) 25 MG 24 hr tablet Take 1 tablet (25 mg total) by mouth daily.  . Multiple Vitamin (MULTIVITAMIN WITH MINERALS) TABS tablet Take 1 tablet by mouth  daily.  . pantoprazole (PROTONIX) 40 MG tablet Take 1 tablet (40 mg total) by mouth daily.  . polyethylene glycol (MIRALAX / GLYCOLAX) packet Take 17 g by mouth daily. (Patient taking differently: Take 17 g by mouth daily as needed for moderate constipation. )  . rosuvastatin (CRESTOR) 10 MG tablet Take 1 tablet (10 mg total) by mouth daily.  Marland Kitchen ULTICARE MINI PEN NEEDLES 31G X 6 MM MISC USE AS DIRECTED  . [DISCONTINUED] rosuvastatin (CRESTOR) 5 MG tablet Take 5 mg by mouth daily.   No facility-administered encounter medications on file as of 08/06/2019.    ALLERGIES: No Known Allergies  VACCINATION STATUS:  There is no immunization history on file for this patient.  Diabetes He presents for his follow-up diabetic visit. He has type 2 diabetes mellitus. Onset time: He was diagnosed at approximate age of 63 years. His disease course has been worsening. There are no hypoglycemic associated symptoms. Pertinent negatives for hypoglycemia include no confusion, headaches, pallor or seizures. Associated symptoms include blurred vision, polydipsia and polyuria. Pertinent negatives for diabetes include no chest pain, no fatigue, no polyphagia and no weakness. There are no hypoglycemic complications. Symptoms are worsening. Diabetic complications include nephropathy and PVD. (Patient is status post left below-knee amputation as a complication of diabetes.) Risk factors for coronary artery disease include diabetes mellitus, dyslipidemia, family history, male sex, obesity, hypertension, sedentary lifestyle and tobacco exposure. Current diabetic treatment includes insulin injections and oral agent (monotherapy) (Is currently on Lantus 28 units daily in the morning and metformin 2000 g daily.). His weight is increasing steadily. He is following a generally unhealthy diet. When asked about meal planning, he reported none. He has not had a previous visit with a dietitian. He never (He is currently wheelchair-bound due  to left BKA.) participates in exercise. His home blood glucose trend is fluctuating minimally. His breakfast blood glucose range is generally >200 mg/dl. His lunch blood glucose range is generally >200 mg/dl. His dinner blood glucose range is generally >200 mg/dl. His bedtime blood glucose range is generally >200 mg/dl. His overall blood glucose range is >200 mg/dl. (He presents with persistently above target glycemic profile and point-of-care A1c is 12%.) An ACE inhibitor/angiotensin II receptor blocker is not being taken. He does not see a podiatrist.Eye exam is current.  Hyperlipidemia This is a chronic problem. The current episode started more than 1 year  ago. Exacerbating diseases include diabetes and obesity. Pertinent negatives include no chest pain, myalgias or shortness of breath. Current antihyperlipidemic treatment includes statins. Risk factors for coronary artery disease include dyslipidemia, diabetes mellitus, hypertension, male sex, obesity, a sedentary lifestyle and family history.  Hypertension This is a chronic problem. The current episode started more than 1 year ago. Associated symptoms include blurred vision. Pertinent negatives include no chest pain, headaches, neck pain, palpitations or shortness of breath. Risk factors for coronary artery disease include dyslipidemia, family history, diabetes mellitus, obesity, male gender, sedentary lifestyle and smoking/tobacco exposure. Past treatments include calcium channel blockers and beta blockers. Hypertensive end-organ damage includes PVD.    Review of Systems  Constitutional: Negative for chills, fatigue, fever and unexpected weight change.  HENT: Negative for dental problem, mouth sores and trouble swallowing.   Eyes: Positive for blurred vision. Negative for visual disturbance.  Respiratory: Negative for cough, choking, chest tightness, shortness of breath and wheezing.   Cardiovascular: Negative for chest pain, palpitations and leg  swelling.  Gastrointestinal: Negative for abdominal distention, abdominal pain, constipation, diarrhea, nausea and vomiting.  Endocrine: Positive for polydipsia and polyuria. Negative for polyphagia.  Genitourinary: Negative for dysuria, flank pain, hematuria and urgency.  Musculoskeletal: Positive for gait problem. Negative for back pain, myalgias and neck pain.       Patient is on a wheelchair due to recent left BKA.  Skin: Negative for pallor, rash and wound.  Neurological: Negative for seizures, syncope, weakness, numbness and headaches.  Psychiatric/Behavioral: Negative.  Negative for confusion and dysphoric mood.    Objective:    BP 137/78   Pulse 83   Ht 5\' 7"  (1.702 m)   Wt 250 lb 12.8 oz (113.8 kg)   BMI 39.28 kg/m   Wt Readings from Last 3 Encounters:  08/06/19 250 lb 12.8 oz (113.8 kg)  11/25/18 226 lb (102.5 kg)  09/05/18 226 lb (102.5 kg)     Diabetic Labs (most recent): Lab Results  Component Value Date   HGBA1C 12 08/06/2019   HGBA1C 12.5 (H) 10/28/2018   HGBA1C 12.8 (H) 05/02/2018   Recent Results (from the past 2160 hour(s))  COMPLETE METABOLIC PANEL WITH GFR     Status: Abnormal   Collection Time: 07/10/19  9:57 AM  Result Value Ref Range   Glucose, Bld 201 (H) 65 - 99 mg/dL    Comment: .            Fasting reference interval . For someone without known diabetes, a glucose value >125 mg/dL indicates that they may have diabetes and this should be confirmed with a follow-up test. .    BUN 20 7 - 25 mg/dL   Creat 0.84 0.70 - 1.25 mg/dL    Comment: For patients >35 years of age, the reference limit for Creatinine is approximately 13% higher for people identified as African-American. .    GFR, Est Non African American 95 > OR = 60 mL/min/1.47m2   GFR, Est African American 110 > OR = 60 mL/min/1.23m2   BUN/Creatinine Ratio NOT APPLICABLE 6 - 22 (calc)   Sodium 136 135 - 146 mmol/L   Potassium 4.4 3.5 - 5.3 mmol/L   Chloride 103 98 - 110 mmol/L    CO2 25 20 - 32 mmol/L   Calcium 9.7 8.6 - 10.3 mg/dL   Total Protein 8.0 6.1 - 8.1 g/dL   Albumin 4.3 3.6 - 5.1 g/dL   Globulin 3.7 1.9 - 3.7 g/dL (calc)   AG Ratio 1.2  1.0 - 2.5 (calc)   Total Bilirubin 0.2 0.2 - 1.2 mg/dL   Alkaline phosphatase (APISO) 88 35 - 144 U/L   AST 13 10 - 35 U/L   ALT 17 9 - 46 U/L  Microalbumin / creatinine urine ratio     Status: None   Collection Time: 07/10/19  9:57 AM  Result Value Ref Range   Creatinine, Urine 121 20 - 320 mg/dL   Microalb, Ur 0.4 mg/dL    Comment: Reference Range Not established    Microalb Creat Ratio 3 <30 mcg/mg creat    Comment: . The ADA defines abnormalities in albumin excretion as follows: Marland Kitchen Category         Result (mcg/mg creatinine) . Normal                    <30 Microalbuminuria         30-299  Clinical albuminuria   > OR = 300 . The ADA recommends that at least two of three specimens collected within a 3-6 month period be abnormal before considering a patient to be within a diagnostic category.   Lipid panel     Status: Abnormal   Collection Time: 07/10/19  9:57 AM  Result Value Ref Range   Cholesterol 107 <200 mg/dL   HDL 19 (L) > OR = 40 mg/dL   Triglycerides 446 (H) <150 mg/dL    Comment: . If a non-fasting specimen was collected, consider repeat triglyceride testing on a fasting specimen if clinically indicated.  [name] et al. J. of Clin. Lipidol. [id number]. Marland Kitchen    LDL Cholesterol (Calc)  mg/dL (calc)    Comment: . LDL cholesterol not calculated. Triglyceride levels greater than 400 mg/dL invalidate calculated LDL results. . Reference range: <100 . Desirable range <100 mg/dL for primary prevention;   <70 mg/dL for patients with CHD or diabetic patients  with > or = 2 CHD risk factors. Marland Kitchen LDL-C is now calculated using the Martin-Hopkins  calculation, which is a validated novel method providing  better accuracy than the Friedewald equation in the  estimation of LDL-C.  Cresenciano Genre et al.  Annamaria Helling. [id number]): 2061-2068  (http://education.[facility].com/faq/FAQ164)    Total CHOL/HDL Ratio 5.6 (H) <5.0 (calc)   Non-HDL Cholesterol (Calc) 88 <130 mg/dL (calc)    Comment: For patients with diabetes plus 1 major ASCVD risk  factor, treating to a non-HDL-C goal of <100 mg/dL  (LDL-C of <70 mg/dL) is considered a therapeutic  option.   TSH     Status: Abnormal   Collection Time: 07/10/19  9:57 AM  Result Value Ref Range   TSH 4.59 (H) 0.40 - 4.50 mIU/L  T4, free     Status: None   Collection Time: 07/10/19  9:57 AM  Result Value Ref Range   Free T4 1.0 0.8 - 1.8 ng/dL  Hemoglobin A1c     Status: None   Collection Time: 08/06/19 12:00 AM  Result Value Ref Range   Hemoglobin A1C 12      Lipid Panel     Component Value Date/Time   CHOL 107 07/10/2019 0957   TRIG 446 (H) 07/10/2019 0957   HDL 19 (L) 07/10/2019 0957   CHOLHDL 5.6 (H) 07/10/2019 0957   [place]  07/10/2019 0957     Comment:     . LDL cholesterol not calculated. Triglyceride levels greater than 400 mg/dL invalidate calculated LDL results. . Reference range: <100 . Desirable range <100 mg/dL for primary prevention;   <70  mg/dL for patients with CHD or diabetic patients  with > or = 2 CHD risk factors. Marland Kitchen LDL-C is now calculated using the Martin-Hopkins  calculation, which is a validated novel method providing  better accuracy than the Friedewald equation in the  estimation of LDL-C.  Cresenciano Genre et al. Annamaria Helling. [id number]): 2061-2068  (http://education.[facility].com/faq/FAQ164)      Assessment & Plan:   1. DM type 2 causing vascular disease ([place]) - [name] has currently uncontrolled symptomatic type 2 DM since 59 years of age. -Patient returns with persistently above target glycemic profile and point-of-care A1c is 12%, largely unchanged from 12.5% A1c during his last visit.  He did not document no report hypoglycemia.  He is most recent readings are between 296-361.  -He  is being assisted by his son [name] to clinic today.  He insists that he has been following his regimen prescribed for him during his last visit.   Recent labs reviewed.  -his diabetes is complicated by peripheral neuropathy, peripheral arterial disease complicated by left BKA and he remains at extremely high risk for more acute and chronic complications which include CAD, CVA, CKD, retinopathy, and neuropathy. These are all discussed in detail with him.  - I have counseled him on diet management and weight loss, by adopting a carbohydrate restricted/protein rich diet.  - he  admits there is a room for improvement in his diet and drink choices. -  Suggestion is made for him to avoid simple carbohydrates  from his diet including Cakes, Sweet Desserts / Pastries, Ice Cream, Soda (diet and regular), Sweet Tea, Candies, Chips, Cookies, Sweet Pastries,  Store Bought Juices, Alcohol in Excess of  1-2 drinks a day, Artificial Sweeteners, Coffee Creamer, and "Sugar-free" Products. This will help patient to have stable blood glucose profile and potentially avoid unintended weight gain.   - I encouraged him to switch to  unprocessed or minimally processed complex starch and increased protein intake (animal or plant source), fruits, and vegetables.  - he is advised to stick to a routine mealtimes to eat 3 meals  a day and avoid unnecessary snacks ( to snack only to correct hypoglycemia).   - he has been  scheduled with Jearld Fenton, RDN, CDE for individualized diabetes education.  - I have approached him with the following individualized plan to manage diabetes and patient agrees:   -Based on his current glycemic burden, he will continue to require intensive treatment with basal/bolus insulin.    -Patient himself  is likely cognitively challenged, his son is offering to help him with execution of this treatment plan.  If his home situation is not adequate for this plan, he should be considered  for group home placement.  -He is advised to continue Lantus 100 units nightly, increase NovoLog to 20-26  units 3 times daily AC for pre-meal blood glucose readings of 90 mg/dL, associated with strict monitoring of blood glucose 4 times a day- before meals and at bedtime.    -His major problem is consumption of sweets and sweetened beverages, and large amounts of white bread. - he is encouraged to call clinic for blood glucose levels less than 70 or above 300 mg /dl. - He is advised to continue metformin 500 mg ER twice daily after breakfast and supper, therapeutically suitable for patient . -He is also advised to continue glipizide 5 mg XL p.o. daily at breakfast.    - he is not a candidate for SGLT2 inhibitors due to recent amputation as  a complication of peripheral arterial disease and peripheral neuropathy.  -He is not a suitable candidate for incretin therapy, nor recently a candidate for SGLT2 inhibitors.   - Patient specific target  A1c;  LDL, HDL, Triglycerides, and  Waist Circumference were discussed in detail.  2) BP/HTN:   His blood pressure is controlled to target.   he is advised to continue his current medications including amlodipine 10 mg p.o. daily with breakfast as well as metoprolol 25 mg p.o. Daily.  3) Lipids/HPL: His recent labs showed controlled LDL of 68.  He will continue to benefit from statin therapy.  He is advised to continue Crestor 5 mg p.o. nightly.    Side effects and precautions discussed with him.   4)  Weight/Diet: His BMI 39-lower extremity BKA on prosthetic leg.    CDE Consult will be initiated . Exercise, and detailed carbohydrates information provided  -  detailed on discharge instructions.   5) subclinical hypothyroidism -His TSH is above target at 4.59 along with low normal free T4 1.0.  He will not be initiated on thyroid hormone for now.  She will need repeat thyroid function test before next visit.  6) Chronic Care/Health Maintenance:  -he  Is  on Statin medications and  is encouraged to initiate and continue to follow up with Ophthalmology, Dentist,  Podiatrist at least yearly or according to recommendations, and advised to  stay away from smoking. I have recommended yearly flu vaccine and pneumonia vaccine at least every 5 years; he is advised on exercise of upper body plus stationary devices due to his limitation from left BKA,  and  sleep for at least 7 hours a day.  - I advised patient to maintain close follow up with Nathan Schwalbe, MD for primary care needs.  - Time spent on this patient care encounter:  45 min, of which > 50% was spent in  counseling and the rest reviewing his blood glucose logs , discussing his hypoglycemia and hyperglycemia episodes, reviewing his current and  previous labs / studies  ( including abstraction from other facilities) and medications  doses and developing a  long term treatment plan and documenting his care.   Please refer to Patient Instructions for Blood Glucose Monitoring and Insulin/Medications Dosing Guide"  in media tab for additional information. Please  also refer to " Patient Self Inventory" in the Media  tab for reviewed elements of pertinent patient history.  [name] participated in the discussions, expressed understanding, and voiced agreement with the above plans.  All questions were answered to his satisfaction. he is encouraged to contact clinic should he have any questions or concerns prior to his return visit.   Follow up plan: - Return in about 3 months (around 11/06/2019) for Bring Meter and Logs- A1c in Office.  Glade Lloyd, MD [facility] [address] [place], [place] [address] Phone: [phone]  Fax: [phone]    08/06/2019, 5:58 PM  This note was partially dictated with voice recognition software. Similar sounding words can be transcribed inadequately or may not  be corrected upon review.

## 2019-08-06 NOTE — Patient Instructions (Signed)

## 2019-08-07 ENCOUNTER — Other Ambulatory Visit: Payer: Self-pay | Admitting: "Endocrinology

## 2019-08-07 ENCOUNTER — Telehealth: Payer: Self-pay | Admitting: "Endocrinology

## 2019-08-07 DIAGNOSIS — E1159 Type 2 diabetes mellitus with other circulatory complications: Secondary | ICD-10-CM

## 2019-08-07 MED ORDER — LANTUS SOLOSTAR 100 UNIT/ML ~~LOC~~ SOPN: PEN_INJECTOR | SUBCUTANEOUS | 2 refills | Status: DC

## 2019-08-07 MED ORDER — ACCU-CHEK AVIVA PLUS VI STRP: ORAL_STRIP | 2 refills | Status: DC

## 2019-08-07 MED ORDER — NOVOLOG FLEXPEN 100 UNIT/ML ~~LOC~~ SOPN: PEN_INJECTOR | SUBCUTANEOUS | 2 refills | Status: DC

## 2019-08-07 MED ORDER — ULTICARE MINI PEN NEEDLES 31G X 6 MM MISC: 2 refills | Status: DC

## 2019-08-07 NOTE — Telephone Encounter (Signed)
Pt was here yesterday. He said that his novolog lantus test strips and pen needles need to be called in to the pharmacy at [facility].

## 2019-08-07 NOTE — Telephone Encounter (Signed)
Done

## 2019-09-23 ENCOUNTER — Other Ambulatory Visit: Payer: Self-pay | Admitting: "Endocrinology

## 2019-09-24 ENCOUNTER — Ambulatory Visit: Payer: Medicaid Other | Admitting: Nutrition

## 2019-09-30 ENCOUNTER — Other Ambulatory Visit: Payer: Self-pay

## 2019-09-30 ENCOUNTER — Encounter: Payer: Self-pay | Admitting: Nutrition

## 2019-09-30 ENCOUNTER — Encounter: Payer: Medicaid Other | Attending: "Endocrinology | Admitting: Nutrition

## 2019-09-30 VITALS — Ht 67.0 in | Wt 249.0 lb

## 2019-09-30 DIAGNOSIS — I10 Essential (primary) hypertension: Secondary | ICD-10-CM | POA: Insufficient documentation

## 2019-09-30 DIAGNOSIS — Z794 Long term (current) use of insulin: Secondary | ICD-10-CM | POA: Insufficient documentation

## 2019-09-30 DIAGNOSIS — E119 Type 2 diabetes mellitus without complications: Secondary | ICD-10-CM | POA: Diagnosis present

## 2019-09-30 DIAGNOSIS — E669 Obesity, unspecified: Secondary | ICD-10-CM | POA: Insufficient documentation

## 2019-09-30 DIAGNOSIS — E782 Mixed hyperlipidemia: Secondary | ICD-10-CM | POA: Insufficient documentation

## 2019-09-30 NOTE — Progress Notes (Signed)
  Medical Nutrition Therapy:  Appt start time: 1300 end time:  1330 Assessment:  Primary concerns today: Diabetes Typd 2.  He lives alone. HIs son is here with him. Has cut down portions. BS are better. He admits to sometimes forgetting his insulin at night on occasion. Has been eating more bread and some processed foods. Using Mrs Deliah Boston some.  Has a prosthetist on his leg. Limited mobility and difficuliting cooking meals and ADL. Would benefit from a caregiver to assist with medications and cooking meals and assisting with dressing and bathing. BS are better in 150-250's. A1C last month still a 12% but overall better since then. HIs son helps with grocery shopping. Trying to buy healthier foods for him to eat.  Willing to work on eating habits to improve health and his DM.   Lab Results  Component Value Date   HGBA1C 12 08/06/2019   Preferred Learning Style:   Auditory  Visual  Hands on  Learning Readiness:   Ready  Change in progress   MEDICATIONS:   DIETARY INTAKE:    24-hr recall:  B ( AM), cherrios 2 cups  2% milk,   Snk ( AM):   L ( PM): ( PM):  D ( PM): CHicken and blackeyed , water Snk ( PM):  Beverages: water  Usual physical activity: ADL   Estimated energy needs: 1800  calories 200 g carbohydrates 135 g protein 50 g fat  Progress Towards Goal(s):  In progress.   Nutritional Diagnosis:  NB-1.1 Food and nutrition-related knowledge deficit As related to Diabetes Type 2.  As evidenced by A1C 11.4%.    Intervention:  Nutrition and Diabetes education provided on My Plate, CHO counting, meal planning, portion sizes, timing of meals, avoiding snacks between meals unless having a low blood sugar, target ranges for A1C and blood sugars, signs/symptoms and treatment of hyper/hypoglycemia, monitoring blood sugars, taking medications as prescribed, benefits of exercising 30 minutes per day and prevention of complications of DM.  Goals Cut out hot dogs and  bologna. Cut out salt Increase fresh fruits and vegetables Drink only Use steamable vegetables. Healthy Choice or Smart ones meal replacements. Don't skip meals or forget to take your insulin.  Use sliding scale with meals for insulin. Get A1C down to 8% or less in the next 3 months.  . Teaching Method Utilized:  Visual Auditory Hands on  Handouts given during visit include:  The Plate Method   Meal Plan Card  Diabetes Instructions.  Barriers to learning/adherence to lifestyle change: None  Demonstrated degree of understanding via:  Teach Back   Monitoring/Evaluation:  Dietary intake, exercise, meal planning, and body weight in 3 month(s). . He would benefit from a  Personal care aide with ADL. Also would benefit from PT for greater mobility with his prosthesis.Marland Kitchen

## 2019-09-30 NOTE — Patient Instructions (Addendum)
Goals Cut out hot dogs and bologna. Cut out salt Increase fresh fruits and vegetables Drink only Use steamable vegetables. Healthy Choice or Smart ones meal replacements. Don't skip meals or forget to take your insulin.  Use sliding scale with meals for insulin. Get A1C down to 8% or less in the next 3 months.

## 2019-10-22 ENCOUNTER — Other Ambulatory Visit: Payer: Self-pay | Admitting: "Endocrinology

## 2019-10-22 DIAGNOSIS — E1159 Type 2 diabetes mellitus with other circulatory complications: Secondary | ICD-10-CM

## 2019-11-10 ENCOUNTER — Other Ambulatory Visit: Payer: Self-pay

## 2019-11-10 ENCOUNTER — Encounter: Payer: Self-pay | Admitting: Nutrition

## 2019-11-10 ENCOUNTER — Encounter: Payer: Medicaid Other | Attending: "Endocrinology | Admitting: Nutrition

## 2019-11-10 ENCOUNTER — Ambulatory Visit (INDEPENDENT_AMBULATORY_CARE_PROVIDER_SITE_OTHER): Payer: Medicaid Other | Admitting: "Endocrinology

## 2019-11-10 ENCOUNTER — Encounter: Payer: Self-pay | Admitting: "Endocrinology

## 2019-11-10 VITALS — Ht 67.0 in | Wt 251.0 lb

## 2019-11-10 VITALS — BP 130/65 | HR 68 | Ht 67.0 in | Wt 251.2 lb

## 2019-11-10 DIAGNOSIS — E782 Mixed hyperlipidemia: Secondary | ICD-10-CM | POA: Insufficient documentation

## 2019-11-10 DIAGNOSIS — I10 Essential (primary) hypertension: Secondary | ICD-10-CM | POA: Diagnosis not present

## 2019-11-10 DIAGNOSIS — E669 Obesity, unspecified: Secondary | ICD-10-CM | POA: Insufficient documentation

## 2019-11-10 DIAGNOSIS — E559 Vitamin D deficiency, unspecified: Secondary | ICD-10-CM

## 2019-11-10 DIAGNOSIS — E1159 Type 2 diabetes mellitus with other circulatory complications: Secondary | ICD-10-CM

## 2019-11-10 DIAGNOSIS — Z794 Long term (current) use of insulin: Secondary | ICD-10-CM | POA: Diagnosis present

## 2019-11-10 DIAGNOSIS — E119 Type 2 diabetes mellitus without complications: Secondary | ICD-10-CM | POA: Insufficient documentation

## 2019-11-10 MED ORDER — GLIPIZIDE ER 10 MG PO TB24: 10.0000 mg | ORAL_TABLET | Freq: Every day | ORAL | 1 refills | Status: DC

## 2019-11-10 MED ORDER — LANTUS SOLOSTAR 100 UNIT/ML ~~LOC~~ SOPN: PEN_INJECTOR | SUBCUTANEOUS | 2 refills | Status: DC

## 2019-11-10 MED ORDER — NOVOLOG FLEXPEN 100 UNIT/ML ~~LOC~~ SOPN: 24.0000 [IU] | PEN_INJECTOR | Freq: Three times a day (TID) | SUBCUTANEOUS | 2 refills | Status: AC

## 2019-11-10 NOTE — Patient Instructions (Signed)
Goals  Don't skip meals. Dont' take meal insulin without eating Avoid snacks between meals Try Healthy Choice or smart ones Lose 2 lbs per month Get A1C down to 7.5%.

## 2019-11-10 NOTE — Progress Notes (Signed)
11/10/2019, 2:57 PM    Endocrinology follow-up note   Subjective:    Patient ID: [name], male    DOB: 28-May-1960.  [name] is being seen in follow-up for management of currently uncontrolled complicated symptomatic  Type 2 diabetes, hyperlipidemia, hypertension. PMD:   Vidal Schwalbe, MD.   Past Medical History:  Diagnosis Date  . Diabetes mellitus without complication ([place])   . GERD (gastroesophageal reflux disease)   . Gout   . Hyperlipidemia   . Hypertension   . Insomnia   . Osteoarthritis    Past Surgical History:  Procedure Laterality Date  . AMPUTATION Left 10/05/2017   Procedure: LEFT BELOW KNEE AMPUTATION;  Surgeon: [name], MD;  Location: [facility];  Service: Orthopedics;  Laterality: Left;  . COLONOSCOPY WITH PROPOFOL N/A 11/28/2018   Procedure: COLONOSCOPY WITH PROPOFOL;  Surgeon: Daneil Dolin, MD;  [facility];  Service: Endoscopy;  Laterality: N/A;  7:30am  . JOINT REPLACEMENT     right knee  . LEG SURGERY    . POLYPECTOMY  11/28/2018   Procedure: POLYPECTOMY;  Surgeon: Daneil Dolin, MD;  [facility]: [facility];  Service: Endoscopy;;  sigmoid polyp x 1, ascending colon x 5   Social History   Socioeconomic History  . Marital status: Single    Spouse name: Not on file  . Number of children: Not on file  . Years of education: Not on file  . Highest education level: Not on file  Occupational History  . Not on file  Tobacco Use  . Smoking status: Former Smoker    Packs/day: 1.00    Years: 50.00    Pack years: 50.00    Types: Cigarettes    Quit date: 10/01/2017    Years since quitting: 2.1  . Smokeless tobacco: Never Used  Vaping Use  . Vaping Use: Never used  Substance and Sexual Activity  . 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 Drug use: No  . Sexual activity: Not Currently    Birth  control/protection: None  Other Topics Concern  . Not on file  Social History Narrative  . Not on file   Social Determinants of Health   Financial Resource Strain:   . Difficulty of Paying Living Expenses:   Food Insecurity:   . Worried About Charity fundraiser in the Last Year:   . Arboriculturist in the Last Year:   Transportation Needs:   . Film/video editor (Medical):   Marland Kitchen Lack of Transportation (Non-Medical):   Physical Activity:   . Days of Exercise per Week:   . Minutes of Exercise per Session:   Stress:   . Feeling of Stress :   Social Connections:   . Frequency of Communication with Friends and Family:   . Frequency of Social Gatherings with Friends and Family:   . Attends Religious Services:   . Active Member of Clubs or Organizations:   . Attends Archivist Meetings:   Marland Kitchen Marital Status:    Outpatient Encounter Medications as of 11/10/2019  Medication Sig  .  acetaminophen (TYLENOL) 325 MG tablet Take 2 tablets (650 mg total) by mouth every 6 (six) hours.  Marland Kitchen amLODipine (NORVASC) 10 MG tablet Take 1 tablet (10 mg total) by mouth daily.  Marland Kitchen aspirin 81 MG chewable tablet Chew 81 mg by mouth daily.  . Cholecalciferol (VITAMIN D) 125 MCG (5000 UT) CAPS TAKE (1) CAPSULE BY MOUTH ONCE DAILY.  Marland Kitchen glipiZIDE (GLUCOTROL XL) 10 MG 24 hr tablet Take 1 tablet (10 mg total) by mouth daily.  Marland Kitchen glucose blood (ACCU-CHEK AVIVA PLUS) test strip CHECK BLOOD SUGAR 4 TIMES DAILY.  Marland Kitchen insulin aspart (NOVOLOG FLEXPEN) 100 UNIT/ML FlexPen Inject 24-30 Units into the skin 3 (three) times daily with meals.  . insulin glargine (LANTUS SOLOSTAR) 100 UNIT/ML Solostar Pen qhs  . Insulin Pen Needle (ULTICARE MINI PEN NEEDLES) 31G X 6 MM MISC USE AS DIRECTED  . Melatonin 3 MG TABS Take 1.5 tablets (4.5 mg total) by mouth at bedtime as needed (sleep).  . metFORMIN (GLUCOPHAGE-XR) 500 MG 24 hr tablet TAKE 1 TABLET BY MOUTH TWICE DAILY AFTER A MEAL  . metoprolol succinate (TOPROL-XL) 25 MG 24  hr tablet Take 1 tablet (25 mg total) by mouth daily.  . Multiple Vitamin (MULTIVITAMIN WITH MINERALS) TABS tablet Take 1 tablet by mouth daily.  . pantoprazole (PROTONIX) 40 MG tablet Take 1 tablet (40 mg total) by mouth daily.  . polyethylene glycol (MIRALAX / GLYCOLAX) packet Take 17 g by mouth daily. (Patient taking differently: Take 17 g by mouth daily as needed for moderate constipation. )  . rosuvastatin (CRESTOR) 10 MG tablet Take 1 tablet (10 mg total) by mouth daily.  . [DISCONTINUED] glipiZIDE (GLUCOTROL XL) 5 MG 24 hr tablet TAKE 1 TABLET BY MOUTH ONCE DAILY.  . [DISCONTINUED] insulin aspart (NOVOLOG FLEXPEN) 100 UNIT/ML FlexPen INJECT 20 TO 26 UNITS INTO THE SKIN THREE TIMES DAILY BEFORE MEALS.  . [DISCONTINUED] LANTUS SOLOSTAR 100 UNIT/ML Solostar Pen INJECT 100 UNITS S.Q. ONCE DAILY.   No facility-administered encounter medications on file as of 11/10/2019.    ALLERGIES: No Known Allergies  VACCINATION STATUS:  There is no immunization history on file for this patient.  Diabetes He presents for his follow-up diabetic visit. He has type 2 diabetes mellitus. Onset time: He was diagnosed at approximate age of 73 years. His disease course has been improving. There are no hypoglycemic associated symptoms. Pertinent negatives for hypoglycemia include no confusion, headaches, pallor or seizures. Associated symptoms include blurred vision, polydipsia and polyuria. Pertinent negatives for diabetes include no chest pain, no fatigue, no polyphagia and no weakness. There are no hypoglycemic complications. Symptoms are improving. Diabetic complications include nephropathy and PVD. (Patient is status post left below-knee amputation as a complication of diabetes.) Risk factors for coronary artery disease include diabetes mellitus, dyslipidemia, family history, male sex, obesity, hypertension, sedentary lifestyle and tobacco exposure. Current diabetic treatment includes insulin injections and oral  agent (monotherapy) (Is currently on Lantus 28 units daily in the morning and metformin 2000 g daily.). His weight is fluctuating minimally. He is following a generally unhealthy diet. When asked about meal planning, he reported none. He has not had a previous visit with a dietitian. 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 His home blood glucose trend is decreasing steadily. His breakfast blood glucose range is generally >200 mg/dl. His lunch blood glucose range is generally 180-200 mg/dl. His dinner blood glucose range is generally 180-200 mg/dl. His bedtime blood glucose range is generally 180-200 mg/dl.  His overall blood glucose range is >200 mg/dl. (He presents with improving, however still significantly above target glycemic profile.  His point-of-care A1c is 9.6% improving from 12%.  He is accompanied by his daughter-in-law who is helping him with diabetes care at home.   ) An ACE inhibitor/angiotensin II receptor blocker is not being taken. He does not see a podiatrist.Eye exam is current.  Hyperlipidemia This is a chronic problem. The current episode started more than 1 year ago. Exacerbating diseases include diabetes and obesity. Pertinent negatives include no chest pain, myalgias or shortness of breath. Current antihyperlipidemic treatment includes statins. Risk factors for coronary artery disease include dyslipidemia, diabetes mellitus, hypertension, male sex, obesity, a sedentary lifestyle and family history.  Hypertension This is a chronic problem. The current episode started more than 1 year ago. Associated symptoms include blurred vision. Pertinent negatives include no chest pain, headaches, neck pain, palpitations or shortness of breath. Risk factors for coronary artery disease include dyslipidemia, family history, diabetes mellitus, obesity, male gender, sedentary lifestyle and smoking/tobacco exposure. Past treatments include calcium channel  blockers and beta blockers. Hypertensive end-organ damage includes PVD.    Review of Systems  Constitutional: Negative for chills, fatigue, fever and unexpected weight change.  HENT: Negative for dental problem, mouth sores and trouble swallowing.   Eyes: Positive for blurred vision. Negative for visual disturbance.  Respiratory: Negative for cough, choking, chest tightness, shortness of breath and wheezing.   Cardiovascular: Negative for chest pain, palpitations and leg swelling.  Gastrointestinal: Negative for abdominal distention, abdominal pain, constipation, diarrhea, nausea and vomiting.  Endocrine: Positive for polydipsia and polyuria. Negative for polyphagia.  Genitourinary: Negative for dysuria, flank pain, hematuria and urgency.  Musculoskeletal: Positive for gait problem. Negative for back pain, myalgias and neck pain.       Patient is on a wheelchair due to recent left BKA.  Skin: Negative for pallor, rash and wound.  Neurological: Negative for seizures, syncope, weakness, numbness and headaches.  Psychiatric/Behavioral: Negative.  Negative for confusion and dysphoric mood.    Objective:    BP 130/65   Pulse 68   Ht 5\' 7"  (1.702 m)   Wt 251 lb 3.2 oz (113.9 kg)   BMI 39.34 kg/m   Wt Readings from Last 3 Encounters:  11/10/19 251 lb 3.2 oz (113.9 kg)  09/30/19 249 lb (112.9 kg)  08/06/19 250 lb 12.8 oz (113.8 kg)     Diabetic Labs (most recent): Lab Results  Component Value Date   HGBA1C 12 08/06/2019   HGBA1C 12.5 (H) 10/28/2018   HGBA1C 12.8 (H) 05/02/2018   No results found for this or any previous visit (from the past 2160 hour(s)).   Lipid Panel     Component Value Date/Time   CHOL 107 07/10/2019 0957   TRIG 446 (H) 07/10/2019 0957   HDL 19 (L) 07/10/2019 0957   CHOLHDL 5.6 (H) 07/10/2019 0957   [place]  07/10/2019 0957     Comment:     . LDL cholesterol not calculated. Triglyceride levels greater than 400 mg/dL invalidate calculated LDL  results. . Reference range: <100 . Desirable range <100 mg/dL for primary prevention;   <70 mg/dL for patients with CHD or diabetic patients  with > or = 2 CHD risk factors. Marland Kitchen LDL-C is now calculated using the Martin-Hopkins  calculation, which is a validated novel method providing  better accuracy than the Friedewald equation in the  estimation of LDL-C.  Cresenciano Genre et al. Annamaria Helling. 3734;287(68): 2061-2068  (http://education.[employer].com/faq/FAQ164)  Assessment & Plan:   1. DM type 2 causing vascular disease ([place]) - [name] has currently uncontrolled symptomatic type 2 DM since 59 years of age.   He presents with improving, however still significantly above target glycemic profile.  His point-of-care A1c is 9.6% improving from 12%.  He is accompanied by his daughter-in-law who is helping him with diabetes care at home.  He did not document or report hypoglycemia.  -He is being assisted by his daughter-in-law  to clinic today.  He insists that he has been following his regimen prescribed for him during his last visit.   Recent labs reviewed.  -his diabetes is complicated by peripheral neuropathy, peripheral arterial disease complicated by left BKA and he remains at extremely high risk for more acute and chronic complications which include CAD, CVA, CKD, retinopathy, and neuropathy. These are all discussed in detail with him.  - I have counseled him on diet management and weight loss, by adopting a carbohydrate restricted/protein rich diet.  - he  admits there is a room for improvement in his diet and drink choices. -  Suggestion is made for him to avoid simple carbohydrates  from his diet including Cakes, Sweet Desserts / Pastries, Ice Cream, Soda (diet and regular), Sweet Tea, Candies, Chips, Cookies, Sweet Pastries,  Store Bought Juices, Alcohol in Excess of  1-2 drinks a day, Artificial Sweeteners, Coffee Creamer, and "Sugar-free" Products. This will help  patient to have stable blood glucose profile and potentially avoid unintended weight gain.   - I encouraged him to switch to  unprocessed or minimally processed complex starch and increased protein intake (animal or plant source), fruits, and vegetables.  - he is advised to stick to a routine mealtimes to eat 3 meals  a day and avoid unnecessary snacks ( to snack only to correct hypoglycemia).   - he has been  scheduled with Jearld Fenton, RDN, CDE for individualized diabetes education.  - I have approached him with the following individualized plan to manage diabetes and patient agrees:   -Based on his current glycemic burden, he will continue to require intensive treatment with basal/bolus insulin.    -Patient himself  is likely cognitively challenged, he needs 100% supervision on his diabetes care.   -  If his home situation is not adequate for this plan, he should be considered for group home placement.  -He is advised to continue Lantus 100 units nightly, increase NovoLog to 24-30  units 3 times daily AC for pre-meal blood glucose readings of 90 mg/dL, associated with strict monitoring of blood glucose 4 times a day- before meals and at bedtime.    -His major problem is consumption of sweets and sweetened beverages, and large amounts of white bread. - he is encouraged to call clinic for blood glucose levels less than 70 or above 300 mg /dl. - He is advised to continue metformin 500 mg ER twice daily after breakfast and supper, therapeutically suitable for patient . -He is advised to increase glipizide to 10 mg  XL p.o. daily at breakfast.    - he is not a candidate for SGLT2 inhibitors due to recent amputation as a complication of peripheral arterial disease and peripheral neuropathy.  -He is not a suitable candidate for incretin therapy.  - Patient specific target  A1c;  LDL, HDL, Triglycerides, and  Waist Circumference were discussed in detail.  2) BP/HTN:   -His blood pressure  is controlled to target.   he is advised to continue  his current medications including amlodipine 10 mg p.o. daily with breakfast as well as metoprolol 25 mg p.o. Daily.  3) Lipids/HPL: His recent labs showed uncontrolled hypertriglyceridemia 446, LDL not calculated.   He is advised to continue Crestor 5 mg p.o. nightly.    Side effects and precautions discussed with him.   4)  Weight/Diet: His BMI 39.3-lower extremity BKA on prosthetic leg.    CDE Consult will be initiated . Exercise, and detailed carbohydrates information provided  -  detailed on discharge instructions.   5) subclinical hypothyroidism -His TSH is above target at 4.59 along with low normal free T4 1.0.  He will not be initiated on thyroid hormone for now.  He will be considered for repeat thyroid function test before his next visit.     6) Chronic Care/Health Maintenance:  -he  Is on Statin medications and  is encouraged to initiate and continue to follow up with Ophthalmology, Dentist,  Podiatrist at least yearly or according to recommendations, and advised to  stay away from smoking. I have recommended yearly flu vaccine and pneumonia vaccine at least every 5 years; he is advised on exercise of upper body plus stationary devices due to his limitation from left BKA,  and  sleep for at least 7 hours a day.  - I advised patient to maintain close follow up with Vidal Schwalbe, MD for primary care needs.  - Time spent on this patient care encounter:  35 min, of which > 50% was spent in  counseling and the rest reviewing his blood glucose logs , discussing his hypoglycemia and hyperglycemia episodes, reviewing his current and  previous labs / studies  ( including abstraction from other facilities) and medications  doses and developing a  long term treatment plan and documenting his care.   Please refer to Patient Instructions for Blood Glucose Monitoring and Insulin/Medications Dosing Guide"  in media tab for additional information.  Please  also refer to " Patient Self Inventory" in the Media  tab for reviewed elements of pertinent patient history.  [name] participated in the discussions, expressed understanding, and voiced agreement with the above plans.  All questions were answered to his satisfaction. he is encouraged to contact clinic should he have any questions or concerns prior to his return visit.   Follow up plan: - Return in about 3 months (around 02/10/2020) for F/U with Pre-visit Labs, Meter, Logs, A1c here.Glade Lloyd, MD [facility] [address] [place], [place] [address] Phone: [phone]  Fax: [phone]    11/10/2019, 2:57 PM  This note was partially dictated with voice recognition software. Similar sounding words can be transcribed inadequately or may not  be corrected upon review.

## 2019-11-10 NOTE — Progress Notes (Signed)
  Medical Nutrition Therapy:  Appt start time: 1300 end time:  1330 Assessment:  Primary concerns today: Diabetes Type 2.  He lives alone. Daughter in law in here with him. He walks with a cane. Very unsteady with walking with his prosthesis. High Risk for Falls. He is eating better. Cut out junk food, processed foods and sweets. Cut back on portions. HIs son and daughter in law do the shopping for him. A1C down to 9.6%. Cut out proceseed foods and portion sizes. BM  Within normal limits.  Urine is more clear than dark  Glipizide 10 mg once a day. Metformin 500 BID. Lantus 100 units at night. Novolog starts at 24 units with meals plus sliding scale. He sometimes misses breakfast and then takes insulin without eating. Re-educated him on the dangers of taking meal insulin without eating a meal. Uses microwave or stove to cook some. Willing to use Healthy Choice or Smart One TV dinners and steamable vegetables for meals instead of canned foods.   Lab Results  Component Value Date   HGBA1C 12 08/06/2019   Preferred Learning Style:   Auditory  Visual  Hands on  Learning Readiness:   Ready  Change in progress   MEDICATIONS:   DIETARY INTAKE:    24-hr recall:  B ( AM), L ( PM): green beans and corn,  water D ( PM): Chicken and blackeyed , water Snk ( PM):  Beverages: water  Usual physical activity: ADL   Estimated energy needs: 1800  calories 200 g carbohydrates 135 g protein 50 g fat  Progress Towards Goal(s):  In progress.   Nutritional Diagnosis:  NB-1.1 Food and nutrition-related knowledge deficit As related to Diabetes Type 2.  As evidenced by A1C 11.4%.    Intervention:  Nutrition and Diabetes education provided on My Plate, CHO counting, meal planning, portion sizes, timing of meals, avoiding snacks between meals unless having a low blood sugar, target ranges for A1C and blood sugars, signs/symptoms and treatment of hyper/hypoglycemia, monitoring blood sugars,  taking medications as prescribed, benefits of exercising 30 minutes per day and prevention of complications of DM.  Goals  Don't skip meals. Dont' take meal insulin without eating Avoid snacks between meals Try Healthy Choice or smart ones Lose 2 lbs per month Get A1C down to 7.5%  . Teaching Method Utilized:  Visual Auditory Hands on  Handouts given during visit include:  The Plate Method   Meal Plan Card  Diabetes Instructions.  Barriers to learning/adherence to lifestyle change: None  Demonstrated degree of understanding via:  Teach Back   Monitoring/Evaluation:  Dietary intake, exercise, meal planning, and body weight in 3 month(s). . He would benefit from a  Personal care aide with ADL. Also would benefit from PT for greater mobility with his prosthesis. Recommend he use a walker instead of a cane for better balance.

## 2019-11-10 NOTE — Patient Instructions (Signed)

## 2020-01-08 ENCOUNTER — Other Ambulatory Visit: Payer: Self-pay

## 2020-01-08 ENCOUNTER — Emergency Department (HOSPITAL_COMMUNITY): Payer: Medicaid Other

## 2020-01-08 ENCOUNTER — Inpatient Hospital Stay (HOSPITAL_COMMUNITY)
Admission: EM | Admit: 2020-01-08 | Discharge: 2020-01-17 | DRG: 177 | Disposition: A | Discharge status: Home Health | Payer: Medicaid Other | Attending: Family Medicine | Admitting: Family Medicine

## 2020-01-08 ENCOUNTER — Encounter (HOSPITAL_COMMUNITY): Payer: Self-pay | Admitting: *Deleted

## 2020-01-08 DIAGNOSIS — E782 Mixed hyperlipidemia: Secondary | ICD-10-CM | POA: Diagnosis present

## 2020-01-08 DIAGNOSIS — J1282 Pneumonia due to coronavirus disease 2019: Secondary | ICD-10-CM | POA: Diagnosis present

## 2020-01-08 DIAGNOSIS — E669 Obesity, unspecified: Secondary | ICD-10-CM | POA: Diagnosis present

## 2020-01-08 DIAGNOSIS — K219 Gastro-esophageal reflux disease without esophagitis: Secondary | ICD-10-CM | POA: Diagnosis present

## 2020-01-08 DIAGNOSIS — U071 COVID-19: Principal | ICD-10-CM | POA: Diagnosis present

## 2020-01-08 DIAGNOSIS — R7401 Elevation of levels of liver transaminase levels: Secondary | ICD-10-CM | POA: Diagnosis present

## 2020-01-08 DIAGNOSIS — Z6839 Body mass index (BMI) 39.0-39.9, adult: Secondary | ICD-10-CM | POA: Diagnosis not present

## 2020-01-08 DIAGNOSIS — R296 Repeated falls: Secondary | ICD-10-CM | POA: Diagnosis present

## 2020-01-08 DIAGNOSIS — D75839 Thrombocytosis, unspecified: Secondary | ICD-10-CM | POA: Diagnosis present

## 2020-01-08 DIAGNOSIS — Z794 Long term (current) use of insulin: Secondary | ICD-10-CM

## 2020-01-08 DIAGNOSIS — E875 Hyperkalemia: Secondary | ICD-10-CM | POA: Diagnosis present

## 2020-01-08 DIAGNOSIS — Y92239 Unspecified place in hospital as the place of occurrence of the external cause: Secondary | ICD-10-CM | POA: Diagnosis not present

## 2020-01-08 DIAGNOSIS — R944 Abnormal results of kidney function studies: Secondary | ICD-10-CM | POA: Diagnosis present

## 2020-01-08 DIAGNOSIS — Z7984 Long term (current) use of oral hypoglycemic drugs: Secondary | ICD-10-CM

## 2020-01-08 DIAGNOSIS — Z9119 Patient's noncompliance with other medical treatment and regimen: Secondary | ICD-10-CM

## 2020-01-08 DIAGNOSIS — E860 Dehydration: Secondary | ICD-10-CM | POA: Diagnosis present

## 2020-01-08 DIAGNOSIS — M109 Gout, unspecified: Secondary | ICD-10-CM | POA: Diagnosis present

## 2020-01-08 DIAGNOSIS — Z781 Physical restraint status: Secondary | ICD-10-CM

## 2020-01-08 DIAGNOSIS — R7989 Other specified abnormal findings of blood chemistry: Secondary | ICD-10-CM | POA: Diagnosis present

## 2020-01-08 DIAGNOSIS — F05 Delirium due to known physiological condition: Secondary | ICD-10-CM | POA: Diagnosis present

## 2020-01-08 DIAGNOSIS — T380X5A Adverse effect of glucocorticoids and synthetic analogues, initial encounter: Secondary | ICD-10-CM | POA: Diagnosis not present

## 2020-01-08 DIAGNOSIS — Z89512 Acquired absence of left leg below knee: Secondary | ICD-10-CM | POA: Diagnosis not present

## 2020-01-08 DIAGNOSIS — M6282 Rhabdomyolysis: Secondary | ICD-10-CM | POA: Diagnosis present

## 2020-01-08 DIAGNOSIS — I10 Essential (primary) hypertension: Secondary | ICD-10-CM | POA: Diagnosis present

## 2020-01-08 DIAGNOSIS — Z9114 Patient's other noncompliance with medication regimen: Secondary | ICD-10-CM

## 2020-01-08 DIAGNOSIS — E1165 Type 2 diabetes mellitus with hyperglycemia: Secondary | ICD-10-CM | POA: Diagnosis present

## 2020-01-08 DIAGNOSIS — M1990 Unspecified osteoarthritis, unspecified site: Secondary | ICD-10-CM | POA: Diagnosis present

## 2020-01-08 DIAGNOSIS — R4182 Altered mental status, unspecified: Secondary | ICD-10-CM

## 2020-01-08 DIAGNOSIS — D72829 Elevated white blood cell count, unspecified: Secondary | ICD-10-CM

## 2020-01-08 DIAGNOSIS — Z87891 Personal history of nicotine dependence: Secondary | ICD-10-CM

## 2020-01-08 DIAGNOSIS — Z800 Family history of malignant neoplasm of digestive organs: Secondary | ICD-10-CM

## 2020-01-08 DIAGNOSIS — Z79899 Other long term (current) drug therapy: Secondary | ICD-10-CM

## 2020-01-08 DIAGNOSIS — Z833 Family history of diabetes mellitus: Secondary | ICD-10-CM

## 2020-01-08 DIAGNOSIS — E871 Hypo-osmolality and hyponatremia: Secondary | ICD-10-CM | POA: Diagnosis present

## 2020-01-08 DIAGNOSIS — G9341 Metabolic encephalopathy: Secondary | ICD-10-CM | POA: Diagnosis present

## 2020-01-08 DIAGNOSIS — W19XXXA Unspecified fall, initial encounter: Secondary | ICD-10-CM | POA: Diagnosis present

## 2020-01-08 DIAGNOSIS — Z79890 Hormone replacement therapy (postmenopausal): Secondary | ICD-10-CM

## 2020-01-08 DIAGNOSIS — R9431 Abnormal electrocardiogram [ECG] [EKG]: Secondary | ICD-10-CM | POA: Diagnosis not present

## 2020-01-08 DIAGNOSIS — D649 Anemia, unspecified: Secondary | ICD-10-CM | POA: Diagnosis present

## 2020-01-08 LAB — CBC WITH DIFFERENTIAL/PLATELET
Abs Immature Granulocytes: 0.17 10*3/uL — ABNORMAL HIGH (ref 0.00–0.07)
Basophils Absolute: 0.1 10*3/uL (ref 0.0–0.1)
Basophils Relative: 0 %
Eosinophils Absolute: 0.4 10*3/uL (ref 0.0–0.5)
Eosinophils Relative: 2 %
HCT: 32.3 % — ABNORMAL LOW (ref 39.0–52.0)
Hemoglobin: 11 g/dL — ABNORMAL LOW (ref 13.0–17.0)
Immature Granulocytes: 1 %
Lymphocytes Relative: 10 %
Lymphs Abs: 2.1 10*3/uL (ref 0.7–4.0)
MCH: 27.4 pg (ref 26.0–34.0)
MCHC: 34.1 g/dL (ref 30.0–36.0)
MCV: 80.3 fL (ref 80.0–100.0)
Monocytes Absolute: 2.3 10*3/uL — ABNORMAL HIGH (ref 0.1–1.0)
Monocytes Relative: 11 %
Neutro Abs: 16.5 10*3/uL — ABNORMAL HIGH (ref 1.7–7.7)
Neutrophils Relative %: 76 %
Platelets: 661 10*3/uL — ABNORMAL HIGH (ref 150–400)
RBC: 4.02 MIL/uL — ABNORMAL LOW (ref 4.22–5.81)
RDW: 13.2 % (ref 11.5–15.5)
WBC: 21.5 10*3/uL — ABNORMAL HIGH (ref 4.0–10.5)
nRBC: 0.1 % (ref 0.0–0.2)

## 2020-01-08 LAB — URINALYSIS, ROUTINE W REFLEX MICROSCOPIC
Bilirubin Urine: NEGATIVE
Glucose, UA: NEGATIVE mg/dL
Ketones, ur: 5 mg/dL — AB
Leukocytes,Ua: NEGATIVE
Nitrite: NEGATIVE
Protein, ur: 30 mg/dL — AB
Specific Gravity, Urine: 1.015 (ref 1.005–1.030)
pH: 5 (ref 5.0–8.0)

## 2020-01-08 LAB — COMPREHENSIVE METABOLIC PANEL
ALT: 89 U/L — ABNORMAL HIGH (ref 0–44)
AST: 330 U/L — ABNORMAL HIGH (ref 15–41)
Albumin: 3.2 g/dL — ABNORMAL LOW (ref 3.5–5.0)
Alkaline Phosphatase: 79 U/L (ref 38–126)
Anion gap: 13 (ref 5–15)
BUN: 31 mg/dL — ABNORMAL HIGH (ref 8–23)
CO2: 19 mmol/L — ABNORMAL LOW (ref 22–32)
Calcium: 8.9 mg/dL (ref 8.9–10.3)
Chloride: 87 mmol/L — ABNORMAL LOW (ref 98–111)
Creatinine, Ser: 0.86 mg/dL (ref 0.61–1.24)
GFR calc non Af Amer: >60 mL/min (ref >60)
Glucose, Bld: 170 mg/dL — ABNORMAL HIGH (ref 70–99)
Potassium: 5.7 mmol/L — ABNORMAL HIGH (ref 3.5–5.1)
Sodium: 119 mmol/L — CL (ref 135–145)
Total Bilirubin: 1.3 mg/dL — ABNORMAL HIGH (ref 0.3–1.2)
Total Protein: 9 g/dL — ABNORMAL HIGH (ref 6.5–8.1)

## 2020-01-08 LAB — RESP PANEL BY RT PCR (RSV, FLU A&B, COVID)
Influenza A by PCR: NEGATIVE
Influenza B by PCR: NEGATIVE
Respiratory Syncytial Virus by PCR: NEGATIVE
SARS Coronavirus 2 by RT PCR: POSITIVE — AB

## 2020-01-08 LAB — CBG MONITORING, ED: Glucose-Capillary: 175 mg/dL — ABNORMAL HIGH (ref 70–99)

## 2020-01-08 LAB — CK: Total CK: 36340 U/L — ABNORMAL HIGH (ref 49–397)

## 2020-01-08 MED ORDER — SODIUM CHLORIDE 0.9 % IV BOLUS
1000.0000 mL | Freq: Once | INTRAVENOUS | Status: AC
  Administered 2020-01-08: 1000 mL via INTRAVENOUS

## 2020-01-08 MED ORDER — SODIUM CHLORIDE 0.9 % IV SOLN
500.0000 mg | Freq: Once | INTRAVENOUS | Status: AC
  Administered 2020-01-08: 500 mg via INTRAVENOUS
  Filled 2020-01-08: qty 500

## 2020-01-08 MED ORDER — SODIUM CHLORIDE 0.9 % IV SOLN
1.0000 g | Freq: Once | INTRAVENOUS | Status: AC
  Administered 2020-01-08: 1 g via INTRAVENOUS
  Filled 2020-01-08: qty 10

## 2020-01-08 MED ORDER — IOHEXOL 300 MG/ML  SOLN
100.0000 mL | Freq: Once | INTRAMUSCULAR | Status: AC | PRN
  Administered 2020-01-08: 100 mL via INTRAVENOUS

## 2020-01-08 NOTE — ED Provider Notes (Signed)
[facility] EMERGENCY DEPARTMENT Provider Note   CSN: [id number] Arrival date & time: 01/08/20  1930     History Chief Complaint  Patient presents with   Altered Mental Status    [name] is a 59 y.o. male with past medical history significant for diabetes, GERD, gout, hyperlipidemia, hypertension, osteoarthritis, left BKA.  Not anticoagulated.  HPI Patient presents to emergency department today with chief complaint of altered mental status.  Patient is unable to provide any history therefore level 5 caveat applies.  Patient states he is unsure why he is here.   History was able to be given by patient's son at the bedside.  Patient has had odd behavior for the last x2 weeks.  Patient lives alone and has been able to care for himself until lately.  Son states that he checked on patient x4 days ago and patient asked him to buy food from the grocery store because he did not have any.  He looked in the fridge and it was fully stopped.  Patient typically cooks food daily and there is no evidence that he had been cooking recently.  When he checked on patient again today on his arrival he found patient laying naked on the floor.  Patient did say he laid there all night.  Son helped him up and took him to a PCP visit where he had a negative strep screen, labs and UA checked.  Unsure of results.  When son checked on patient 2 more times throughout today he found patient laying on the floor each time.  Patient was never unconscious on his arrival however did seem confused.  He is typically able to take his medicines without difficulty.  Son found pills in unusual places in the house, unsure how long patient has not been taking his medicine.  Patient's daughter-in-law is a Marine scientist.  She noticed that when patient used the bathroom today he had dark and foul-smelling urine.  Patient does not drink alcohol, no history of drug abuse.    Past Medical History:  Diagnosis Date   Diabetes mellitus  without complication ([place])    GERD (gastroesophageal reflux disease)    Gout    Hyperlipidemia    Hypertension    Insomnia    Osteoarthritis     Patient Active Problem List   Diagnosis Date Noted   Rhabdomyolysis 01/08/2020   Encounter for screening colonoscopy 05/27/2018   Vitamin D deficiency 02/06/2018   Mixed hyperlipidemia 12/12/2017   S/P BKA (below knee amputation) unilateral, left ([place]) 11/29/2017   Unilateral complete BKA, right, sequela (H[facility])    Thrombocytosis    Constipation due to pain medication    Below knee amputation status, left    Unilateral complete BKA, left, initial encounter ([place])    Acute blood loss anemia    Post-operative pain    Essential hypertension, benign    Tobacco abuse    S/P amputation    Subacute osteomyelitis, left ankle and foot ([place])    Acute osteomyelitis of left calcaneus (HCC)    Cutaneous abscess of left foot    Severe protein-calorie malnutrition (HCC)    PAD (peripheral artery disease) ([place])    Wound infection 10/02/2017   Type 2 diabetes mellitus treated with insulin ([place])    Tobacco use disorder    Lymphedema 05/28/2017   Essential hypertension 05/28/2017   DM type 2 causing vascular disease ([place]) 05/28/2017   DJD (degenerative joint disease) 05/28/2017   Hyponatremia 04/05/2016   Anemia 04/05/2016  Gout 04/05/2016   Bilateral foot pain 04/04/2016    Past Surgical History:  Procedure Laterality Date   AMPUTATION Left 10/05/2017   Procedure: LEFT BELOW KNEE AMPUTATION;  Surgeon: [name], MD;  Location: [facility];  Service: Orthopedics;  Laterality: Left;   COLONOSCOPY WITH PROPOFOL N/A 11/28/2018   Procedure: COLONOSCOPY WITH PROPOFOL;  Surgeon: Daneil Dolin, MD;  Location[facility];  Service: Endoscopy;  Laterality: N/A;  7:30am   JOINT REPLACEMENT     right knee   LEG SURGERY     POLYPECTOMY  11/28/2018   Procedure: POLYPECTOMY;  Surgeon: Daneil Dolin, MD;   Location: [facility];  Service: Endoscopy;;  sigmoid polyp x 1, ascending colon x 5       Family History  Problem Relation Age of Onset   Diabetes Mother    Colon cancer Maternal Uncle        greater than age 4   Cancer Other        multiple family memebers on mother's side of family.     Social History   Tobacco Use   Smoking status: Former Smoker    Packs/day: 1.00    Years: 50.00    Pack years: 50.00    Types: Cigarettes    Quit date: 10/01/2017    Years since quitting: 2.2   Smokeless tobacco: Never Used  Vaping Use   Vaping Use: Never used  Substance Use Topics   Alcohol use: Not Currently    Comment: heavy drinker for 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   Drug use: No    Home Medications Prior to Admission medications   Medication Sig Start Date End Date Taking? Authorizing Provider  acetaminophen (TYLENOL) 325 MG tablet Take 2 tablets (650 mg total) by mouth every 6 (six) hours. 10/10/17  Yes Love, Ivan Anchors, PA-C  amitriptyline (ELAVIL) 25 MG tablet Take 25 mg by mouth at bedtime.   Yes [provider]  amLODipine (NORVASC) 10 MG tablet Take 1 tablet (10 mg total) by mouth daily. 10/18/17  Yes Love, Ivan Anchors, PA-C  Cholecalciferol (VITAMIN D) 125 MCG (5000 UT) CAPS TAKE (1) CAPSULE BY MOUTH ONCE DAILY. 10/23/19  Yes Nida, Marella Chimes, MD  glipiZIDE (GLUCOTROL XL) 10 MG 24 hr tablet Take 1 tablet (10 mg total) by mouth daily. 11/10/19  Yes Nida, Marella Chimes, MD  insulin aspart (NOVOLOG FLEXPEN) 100 UNIT/ML FlexPen Inject 24-30 Units into the skin 3 (three) times daily with meals. 11/10/19  Yes Cassandria Anger, MD  insulin glargine (LANTUS SOLOSTAR) 100 UNIT/ML Solostar Pen qhs 11/10/19  Yes Nida, Marella Chimes, MD  metFORMIN (GLUCOPHAGE-XR) 500 MG 24 hr tablet TAKE 1 TABLET BY MOUTH TWICE DAILY AFTER A MEAL Patient taking differently: Take 500 mg by mouth 2 (two) times daily. After meals 09/23/19  Yes Nida, Marella Chimes, MD  metoprolol succinate  (TOPROL-XL) 25 MG 24 hr tablet Take 1 tablet (25 mg total) by mouth daily. 10/18/17  Yes Love, Ivan Anchors, PA-C  pantoprazole (PROTONIX) 40 MG tablet Take 1 tablet (40 mg total) by mouth daily. 10/18/17  Yes Love, Ivan Anchors, PA-C  polyethylene glycol (MIRALAX / GLYCOLAX) packet Take 17 g by mouth daily. Patient taking differently: Take 17 g by mouth daily as needed for moderate constipation.  10/18/17  Yes Love, Ivan Anchors, PA-C  rosuvastatin (CRESTOR) 10 MG tablet Take 1 tablet (10 mg total) by mouth daily. 08/06/19  Yes Nida, Marella Chimes, MD  glucose blood (ACCU-CHEK AVIVA PLUS)  test strip CHECK BLOOD SUGAR 4 TIMES DAILY. 08/07/19   Cassandria Anger, MD  Insulin Pen Needle Flossie Buffy MINI PEN NEEDLES) 31G X 6 MM MISC USE AS DIRECTED 08/07/19   Nida, Marella Chimes, MD  Melatonin 3 MG TABS Take 1.5 tablets (4.5 mg total) by mouth at bedtime as needed (sleep). Patient not taking: Reported on 01/08/2020 10/18/17   Love, Ivan Anchors, PA-C  Multiple Vitamin (MULTIVITAMIN WITH MINERALS) TABS tablet Take 1 tablet by mouth daily. Patient not taking: Reported on 01/08/2020 10/19/17   Bary Leriche, PA-C    Allergies    Patient has no known allergies.  Review of Systems   Review of Systems  Unable to perform ROS: Mental status change    Physical Exam Updated Vital Signs BP 132/87 (BP Location: Right Arm)    Pulse 94    Temp 98 F (36.7 C) (Oral)    Resp 18    Ht 5\' 7"  (1.702 m)    Wt 114.8 kg    SpO2 97%    BMI 39.63 kg/m   Physical Exam Vitals and nursing note reviewed.  Constitutional:      General: He is not in acute distress.    Appearance: He is not ill-appearing.  HENT:     Head: Normocephalic and atraumatic.     Comments: No tenderness to palpation of skull. No deformities or crepitus noted. No open wounds, abrasions or lacerations.    Right Ear: Tympanic membrane and external ear normal.     Left Ear: Tympanic membrane and external ear normal.     Nose: Nose normal.     Mouth/Throat:      Mouth: Mucous membranes are dry.     Pharynx: Oropharynx is clear.  Eyes:     General: No scleral icterus.       Right eye: No discharge.        Left eye: No discharge.     Extraocular Movements: Extraocular movements intact.     Conjunctiva/sclera: Conjunctivae normal.     Pupils: Pupils are equal, round, and reactive to light.  Neck:     Vascular: No JVD.     Comments: Full ROM intact without spinous process TTP. No bony stepoffs or deformities, no paraspinous muscle TTP or muscle spasms. No rigidity or meningeal signs. No bruising, erythema, or swelling.  Cardiovascular:     Rate and Rhythm: Normal rate and regular rhythm.     Pulses: Normal pulses.          Radial pulses are 2+ on the right side and 2+ on the left side.     Heart sounds: Normal heart sounds.  Pulmonary:     Comments: Lungs clear to auscultation in all fields. Symmetric chest rise. No wheezing, rales, or rhonchi. Abdominal:     Tenderness: There is no right CVA tenderness or left CVA tenderness.     Comments: Abdomen is soft, non-distended, and non-tender in all quadrants. No rigidity, no guarding. No peritoneal signs.  Musculoskeletal:        General: Normal range of motion.     Cervical back: Normal range of motion.     Comments: Left BKA with prosthetic  Skin:    General: Skin is warm and dry.     Capillary Refill: Capillary refill takes less than 2 seconds.  Neurological:     Mental Status: He is oriented to person, place, and time.     GCS: GCS eye subscore is 4. GCS verbal subscore  is 5. GCS motor subscore is 6.     Comments: Fluent speech, no facial droop.  Alert to self and location only.  Thinks the year is 2019.  Able to follow commands.  Speech is clear.  Strong and equal grip strength in bilateral upper and lower extremities.  CN II through 2-12 intact.  Psychiatric:        Behavior: Behavior normal.     ED Results / Procedures / Treatments   Labs (all labs ordered are listed, but only  abnormal results are displayed) Labs Reviewed  RESP PANEL BY RT PCR (RSV, FLU A&B, COVID) - Abnormal; Notable for the following components:      Result Value   SARS Coronavirus 2 by RT PCR POSITIVE (*)    All other components within normal limits  CBC WITH DIFFERENTIAL/PLATELET - Abnormal; Notable for the following components:   WBC 21.5 (*)    RBC 4.02 (*)    Hemoglobin 11.0 (*)    HCT 32.3 (*)    Platelets 661 (*)    Neutro Abs 16.5 (*)    Monocytes Absolute 2.3 (*)    Abs Immature Granulocytes 0.17 (*)    All other components within normal limits  COMPREHENSIVE METABOLIC PANEL - Abnormal; Notable for the following components:   Sodium 119 (*)    Potassium 5.7 (*)    Chloride 87 (*)    CO2 19 (*)    Glucose, Bld 170 (*)    BUN 31 (*)    Total Protein 9.0 (*)    Albumin 3.2 (*)    AST 330 (*)    ALT 89 (*)    Total Bilirubin 1.3 (*)    All other components within normal limits  URINALYSIS, ROUTINE W REFLEX MICROSCOPIC - Abnormal; Notable for the following components:   Hgb urine dipstick LARGE (*)    Ketones, ur 5 (*)    Protein, ur 30 (*)    Bacteria, UA FEW (*)    All other components within normal limits  CK - Abnormal; Notable for the following components:   Total CK 36,340 (*)    All other components within normal limits  CBG MONITORING, ED - Abnormal; Notable for the following components:   Glucose-Capillary 175 (*)    All other components within normal limits  OSMOLALITY, URINE    EKG None  Radiology CT Head Wo Contrast  Result Date: 01/08/2020 CLINICAL DATA:  Mental status change.  Multiple recent falls. EXAM: CT HEAD WITHOUT CONTRAST CT CERVICAL SPINE WITHOUT CONTRAST TECHNIQUE: Multidetector CT imaging of the head and cervical spine was performed following the standard protocol without intravenous contrast. Multiplanar CT image reconstructions of the cervical spine were also generated. COMPARISON:  Head and cervical spine CTs 08/22/2012 FINDINGS: CT HEAD  FINDINGS Brain: There is no evidence of an acute infarct, intracranial hemorrhage, mass, midline shift, or extra-axial fluid collection. Patchy hypodensities in the cerebral white matter bilaterally may have mildly progressed and are nonspecific but compatible with severe chronic small vessel ischemic disease. Mild cerebral atrophy is not abnormal for age. Vascular: Calcified atherosclerosis at the skull base. No hyperdense vessel. Skull: No acute fracture or suspicious osseous lesion. Sinuses/Orbits: The visualized paranasal sinuses and mastoid air cells are clear. Limited assessment of the orbits due to motion. Other: None. CT CERVICAL SPINE FINDINGS Alignment: Cervical spine straightening.  No listhesis. Skull base and vertebrae: No acute fracture or suspicious osseous lesion. Soft tissues and spinal canal: No prevertebral fluid or swelling. No  visible canal hematoma. Disc levels: Chronic mild-to-moderate disc space narrowing at C4-5 with disc bulging and uncovertebral spurring resulting in likely moderate spinal stenosis and moderate right and mild left neural foraminal stenosis. Upper chest: Partially visualized patchy peripheral ground-glass opacity in the right upper lobe. Other: Calcified atherosclerosis at the right greater than left carotid bifurcations. IMPRESSION: 1. No evidence of acute intracranial abnormality. 2. Severe chronic small vessel ischemic disease. 3. No evidence of acute fracture or traumatic subluxation in the cervical spine. 4. Partially visualized patchy peripheral ground-glass opacity in the right upper lobe, likely infectious/inflammatory. Correlate with pending chest radiograph. Electronically Signed   By: Logan Bores M.D.   On: 01/08/2020 21:33   CT Cervical Spine Wo Contrast  Result Date: 01/08/2020 CLINICAL DATA:  Mental status change.  Multiple recent falls. EXAM: CT HEAD WITHOUT CONTRAST CT CERVICAL SPINE WITHOUT CONTRAST TECHNIQUE: Multidetector CT imaging of the head and  cervical spine was performed following the standard protocol without intravenous contrast. Multiplanar CT image reconstructions of the cervical spine were also generated. COMPARISON:  Head and cervical spine CTs 08/22/2012 FINDINGS: CT HEAD FINDINGS Brain: There is no evidence of an acute infarct, intracranial hemorrhage, mass, midline shift, or extra-axial fluid collection. Patchy hypodensities in the cerebral white matter bilaterally may have mildly progressed and are nonspecific but compatible with severe chronic small vessel ischemic disease. Mild cerebral atrophy is not abnormal for age. Vascular: Calcified atherosclerosis at the skull base. No hyperdense vessel. Skull: No acute fracture or suspicious osseous lesion. Sinuses/Orbits: The visualized paranasal sinuses and mastoid air cells are clear. Limited assessment of the orbits due to motion. Other: None. CT CERVICAL SPINE FINDINGS Alignment: Cervical spine straightening.  No listhesis. Skull base and vertebrae: No acute fracture or suspicious osseous lesion. Soft tissues and spinal canal: No prevertebral fluid or swelling. No visible canal hematoma. Disc levels: Chronic mild-to-moderate disc space narrowing at C4-5 with disc bulging and uncovertebral spurring resulting in likely moderate spinal stenosis and moderate right and mild left neural foraminal stenosis. Upper chest: Partially visualized patchy peripheral ground-glass opacity in the right upper lobe. Other: Calcified atherosclerosis at the right greater than left carotid bifurcations. IMPRESSION: 1. No evidence of acute intracranial abnormality. 2. Severe chronic small vessel ischemic disease. 3. No evidence of acute fracture or traumatic subluxation in the cervical spine. 4. Partially visualized patchy peripheral ground-glass opacity in the right upper lobe, likely infectious/inflammatory. Correlate with pending chest radiograph. Electronically Signed   By: Logan Bores M.D.   On: 01/08/2020 21:33     CT ABDOMEN PELVIS W CONTRAST  Result Date: 01/08/2020 CLINICAL DATA:  Abdominal distension, back pain EXAM: CT ABDOMEN AND PELVIS WITH CONTRAST TECHNIQUE: Multidetector CT imaging of the abdomen and pelvis was performed using the standard protocol following bolus administration of intravenous contrast. CONTRAST:  183mL OMNIPAQUE IOHEXOL 300 MG/ML  SOLN COMPARISON:  04/25/2010 FINDINGS: Lower chest: Bibasilar ground-glass pulmonary infiltrates are present, nonspecific. This may reflect basilar atelectasis or pulmonary edema. Extensive coronary artery calcification. Cardiac size is mildly enlarged. No pericardial effusion. Hepatobiliary: No focal liver abnormality is seen. No gallstones, gallbladder wall thickening, or biliary dilatation. Pancreas: Unremarkable Spleen: Unremarkable Adrenals/Urinary Tract: Adrenal glands are unremarkable. Kidneys are normal, without renal calculi, focal lesion, or hydronephrosis. Bladder is unremarkable. Stomach/Bowel: The stomach, small bowel, and large bowel are unremarkable. Appendix normal. No free intraperitoneal gas or fluid. Vascular/Lymphatic: Moderate atherosclerotic calcification within the infrarenal abdominal aorta. There is thrombosis of the common and external iliac artery without significant distal  reconstitution of the visualized lower extremity arterial outflow clearly identified on this examination. Superimposed moderate atherosclerotic calcification of the left lower extremity arterial outflow. No aortic aneurysm. No pathologic adenopathy within the abdomen and pelvis. Reproductive: Prostate is unremarkable. Other: Rectum unremarkable Musculoskeletal: No acute bone abnormality IMPRESSION: Bibasilar pulmonary infiltrates, poorly evaluated on this examination, possibly representing pulmonary edema. Extensive coronary artery calcification. Peripheral vascular disease with thrombosis of the left lower extremity arterial inflow. No definite reconstitution of the  visualized left lower extremity arterial outflow, though this may be related to bolus timing. Noninvasive left lower extremity arterial examination would be helpful in determining the degree of arteriovascular insufficiency. Aortic Atherosclerosis (ICD10-I70.0). Electronically Signed   By: Fidela Salisbury MD   On: 01/08/2020 22:52   DG Chest Portable 1 View  Result Date: 01/08/2020 CLINICAL DATA:  59 year old male with cough. EXAM: PORTABLE CHEST 1 VIEW COMPARISON:  Chest radiograph dated 08/22/2012. FINDINGS: Faint bilateral hazy interstitial densities primarily involving the mid to lower lung field, and left greater right, new since the prior radiograph and may represent reactive airway disease or atypical infection. Clinical correlation is recommended. No focal consolidation, pleural effusion or pneumothorax. The cardiac silhouette is within limits. No acute osseous pathology. IMPRESSION: Bilateral hazy interstitial densities may represent reactive airway disease or atypical infection. Electronically Signed   By: Anner Crete M.D.   On: 01/08/2020 21:34    Procedures .Critical Care Performed by: Cherre Robins, PA-C Authorized by: Cherre Robins, PA-C   Critical care provider statement:    Critical care time (minutes):  40   Critical care time was exclusive of:  Separately billable procedures and treating other patients and teaching time   Critical care was time spent personally by me on the following activities:  Development of treatment plan with patient or surrogate, evaluation of patient's response to treatment, examination of patient, obtaining history from patient or surrogate, ordering and review of laboratory studies, ordering and review of radiographic studies, pulse oximetry, re-evaluation of patient's condition and review of old charts   (including critical care time)  Medications Ordered in ED Medications  azithromycin (ZITHROMAX) 500 mg in sodium chloride 0.9 % 250  mL IVPB (500 mg Intravenous New Bag/Given 01/08/20 2352)  sodium chloride 0.9 % bolus 1,000 mL (0 mLs Intravenous Stopped 01/08/20 2351)  iohexol (OMNIPAQUE) 300 MG/ML solution 100 mL (100 mLs Intravenous Contrast Given 01/08/20 2219)  sodium chloride 0.9 % bolus 1,000 mL (1,000 mLs Intravenous New Bag/Given 01/08/20 2314)  cefTRIAXone (ROCEPHIN) 1 g in sodium chloride 0.9 % 100 mL IVPB (0 g Intravenous Stopped 01/08/20 2351)    ED Course  I have reviewed the triage vital signs and the nursing notes.  Pertinent labs & imaging results that were available during my care of the patient were reviewed by me and considered in my medical decision making (see chart for details).    MDM Rules/Calculators/A&P                          History provided by patient's son with additional history obtained from chart review.    59 year old male presenting with altered mental status, last known normal x2 weeks ago.  Patient is afebrile, vital signs are stable.  On exam patient is alert to self and location only.  He is able to follow commands and has clear speech.  He has no abdominal tenderness.  No signs of significant head or neck injury.  He does  appear dehydrated, mucous membranes are dry.  CBG on arrival is 175.  Labs show a leukocytosis of 21.5, hemoglobin 11 consistent with his baseline. CMP with hyponatremia 119, hyperkalemia 5.7, bicarb of 19, BUN elevated at 31, creatinine normal, liver enzymes elevated.  AST is 330, ALT is 89.  Total bili is 1.3, he has no history of transaminitis. Normal anion gap. UA is negative for infection. CK is elevated at 36,340.  2L NS ordered. Covid test is positive.  Given his unwitnessed falls CT head and cervical spine were ordered.  I viewed imaging.  There are no acute abnormalities, signs of head bleed or cva.  Chest x-ray viewed by me shows lateral hazy interstitial densities that could be reactive airway disease or atypical infection.  Patient has cough that son noticed  while here with patient. Will cover for possible CAP. EKG shows sinus rhythm, does have prolonged QTc at 539.  CT AP shows bibasilar pulmonary infiltrates radiologist comments possibly representing pulmonary edema however presentation is not consistent with this. He does not appear volume overloaded on exam. Also noted to have peripheral vascular disease with thrombosis of the left lower extremity arterial inflow.  Noninvasive left lower extremity arterial examination is recommended to determine the degree of arteriovascular insufficiency.  This case was discussed with Dr. Lacinda Axon who has seen the patient and agrees with plan to admit. Son Tracy Kinner can be reached if needed [phone].  Spoke with Dr. Bryn Gulling with hospitalist service who agrees to assume care of patient and bring into the hospital for further evaluation and management.     Portions of this note were generated with [employer]. Dictation errors may occur despite best attempts at proofreading.   Final Clinical Impression(s) / ED Diagnoses Final diagnoses:  Altered mental status, unspecified altered mental status type  Hyponatremia  Non-traumatic rhabdomyolysis  COVID-19  Prolonged Q-T interval on ECG    Rx / DC Orders ED Discharge Orders    None       Cherre Robins, PA-C 01/09/20 0045    Nat Christen, MD 01/13/20 [phone]

## 2020-01-08 NOTE — ED Notes (Signed)
Date and time results received: 01/08/20 2336  Test: COVID Critical Value: POSITIVE  Name of Provider Notified: Josephine Cables, MD  Orders Received? Or Actions Taken?: acknowledged

## 2020-01-08 NOTE — ED Triage Notes (Signed)
Pt's son states the pt has not eat in a week and has been falling a lot lately; son stated he went to pick pt up this am and found pt lying in the floor; pt is more confused and c/o back pain and leg pain; son stated the pt has been coughing that started tonight; pt was seen at his PCP today and had blood drawn and a urinalysis but no results yet

## 2020-01-08 NOTE — ED Notes (Signed)
Date and time results received: 01/08/20 2130  Test: Sodium Critical Value: 119  Name of Provider Notified: Albrizze, [place]  Orders Received? Or Actions Taken?: acknowledged

## 2020-01-08 NOTE — H&P (Signed)
History and Physical  [name] JJK:[id number] DOB: March 24, 1961 DOA: 01/08/2020  Referring physician: Emeterio Reeve, PA-C PCP: Vidal Schwalbe, MD  Patient coming from: Home  Chief Complaint: Altered mental status  HPI: [name] is a 59 y.o. male with medical history significant for diabetes, GERD, gout, hyperlipidemia, hypertension, osteoarthritis and left BKA who presents to the emergency department  due to  altered mental status. Patient was unable to provide detailed history on why he came to the emergency department, he states that he came due to high blood glucose level. Patient also states that he fell a couple of times when he tried to transfer from his wheelchair to another chair. Most of the history was obtained from ED PA and ED medical chart. Per report, patient symptoms started about 2 weeks ago with a change in behavior, he lives alone and was able to take care of his ADLs including cooking his own food, but he has not been doing this recently. Patient's son checked on him today and found him naked on the floor, patient stated that he laid there all night per medical record (unusual behavior at baseline), he was taken to his PCP where he had a negative strep screen and pending labs and urinalysis. Patient was found on the floor 2 more times when checked upon by son today. His medications were found in unusual places in the house and is unknown if patient has been compliant with his medication (different from baseline where patient usually takes his medication). His urine was noted to be dark and foul-smelling today, he was then brought to the ED for further evaluation and management. Patient denies fever, chills, nausea, vomiting or abdominal pain. He denies history of tobacco or alcohol use.  ED Course:  In the emergency department, he was hemodynamically stable. Work-up in the ED showed leukocytosis, thrombocytosis, normocytic anemia, hyponatremia, hyperkalemia,  elevated BUN, hyperglycemia, AST 330, ALT 89, total bilirubin 1.3. SARS coronavirus 2 was positive. CT abdomen and pelvis with contrast showed bibasilar pulmonary infiltrates suspected to be due to pulmonary edema. Chest x-ray showed bilateral hazy interstitial densities which may represent reactive airway disease or atypical infection. CT head and CT cervical spine without contrast showed no evidence of acute intracranial abnormality or any evidence of acute fracture or traumatic subluxation in the cervical spine. He was empirically started on IV ceftriaxone and azithromycin, IV hydration was provided. Hospitalist was asked to admit patient for further evaluation and management.  Review of Systems: Constitutional: Negative for chills and fever.  HENT: Negative for ear pain and sore throat.   Eyes: Negative for pain and visual disturbance.  Respiratory: Negative for cough, chest tightness and shortness of breath.   Cardiovascular: Negative for chest pain and palpitations.  Gastrointestinal: Negative for abdominal pain and vomiting.  Endocrine: Negative for polyphagia and polyuria.  Genitourinary: Negative for decreased urine volume, dysuria Musculoskeletal: Positive for back pain. Negative for arthralgias  Skin: Negative for color change and rash.  Allergic/Immunologic: Negative for immunocompromised state.  Neurological: Negative for tremors, syncope, speech difficulty Hematological: Does not bruise/bleed easily.  All other systems reviewed and are negative   Past Medical History:  Diagnosis Date   Diabetes mellitus without complication ([place])    GERD (gastroesophageal reflux disease)    Gout    Hyperlipidemia    Hypertension    Insomnia    Osteoarthritis    Past Surgical History:  Procedure Laterality Date   AMPUTATION Left 10/05/2017   Procedure: LEFT BELOW KNEE AMPUTATION;  Surgeon: [name], MD;  Location: [facility];  Service: Orthopedics;  Laterality: Left;    COLONOSCOPY WITH PROPOFOL N/A 11/28/2018   Procedure: COLONOSCOPY WITH PROPOFOL;  Surgeon: Daneil Dolin, MD;  Location: [facility];  Service: Endoscopy;  Laterality: N/A;  7:30am   JOINT REPLACEMENT     right knee   LEG SURGERY     POLYPECTOMY  11/28/2018   Procedure: POLYPECTOMY;  Surgeon: Daneil Dolin, MD;  Location: [facility];  Service: Endoscopy;;  sigmoid polyp x 1, ascending colon x 5    Social History:  reports that he quit smoking about 2 years ago. His smoking use included cigarettes. He has a 50.00 pack-year smoking history. He has never used smokeless tobacco. He reports previous alcohol use. He reports that he does not use drugs.   No Known Allergies  Family History  Problem Relation Age of Onset   Diabetes Mother    Colon cancer Maternal Uncle        greater than age 71   Cancer Other        multiple family memebers on mother's side of family.      Prior to Admission medications   Medication Sig Start Date End Date Taking? Authorizing Provider  acetaminophen (TYLENOL) 325 MG tablet Take 2 tablets (650 mg total) by mouth every 6 (six) hours. 10/10/17  Yes Love, Ivan Anchors, PA-C  amitriptyline (ELAVIL) 25 MG tablet Take 25 mg by mouth at bedtime.   Yes [provider]  amLODipine (NORVASC) 10 MG tablet Take 1 tablet (10 mg total) by mouth daily. 10/18/17  Yes Love, Ivan Anchors, PA-C  Cholecalciferol (VITAMIN D) 125 MCG (5000 UT) CAPS TAKE (1) CAPSULE BY MOUTH ONCE DAILY. 10/23/19  Yes Nida, Marella Chimes, MD  glipiZIDE (GLUCOTROL XL) 10 MG 24 hr tablet Take 1 tablet (10 mg total) by mouth daily. 11/10/19  Yes Nida, Marella Chimes, MD  insulin aspart (NOVOLOG FLEXPEN) 100 UNIT/ML FlexPen Inject 24-30 Units into the skin 3 (three) times daily with meals. 11/10/19  Yes Cassandria Anger, MD  insulin glargine (LANTUS SOLOSTAR) 100 UNIT/ML Solostar Pen qhs 11/10/19  Yes Nida, Marella Chimes, MD  metFORMIN (GLUCOPHAGE-XR) 500 MG 24 hr tablet TAKE 1 TABLET BY  MOUTH TWICE DAILY AFTER A MEAL Patient taking differently: Take 500 mg by mouth 2 (two) times daily. After meals 09/23/19  Yes Nida, Marella Chimes, MD  metoprolol succinate (TOPROL-XL) 25 MG 24 hr tablet Take 1 tablet (25 mg total) by mouth daily. 10/18/17  Yes Love, Ivan Anchors, PA-C  pantoprazole (PROTONIX) 40 MG tablet Take 1 tablet (40 mg total) by mouth daily. 10/18/17  Yes Love, Ivan Anchors, PA-C  polyethylene glycol (MIRALAX / GLYCOLAX) packet Take 17 g by mouth daily. Patient taking differently: Take 17 g by mouth daily as needed for moderate constipation.  10/18/17  Yes Love, Ivan Anchors, PA-C  rosuvastatin (CRESTOR) 10 MG tablet Take 1 tablet (10 mg total) by mouth daily. 08/06/19  Yes Nida, Marella Chimes, MD  glucose blood (ACCU-CHEK AVIVA PLUS) test strip CHECK BLOOD SUGAR 4 TIMES DAILY. 08/07/19   Cassandria Anger, MD  Insulin Pen Needle Flossie Buffy MINI PEN NEEDLES) 31G X 6 MM MISC USE AS DIRECTED 08/07/19   Nida, Marella Chimes, MD  Melatonin 3 MG TABS Take 1.5 tablets (4.5 mg total) by mouth at bedtime as needed (sleep). Patient not taking: Reported on 01/08/2020 10/18/17   Love, Ivan Anchors, PA-C  Multiple Vitamin (MULTIVITAMIN WITH MINERALS)  TABS tablet Take 1 tablet by mouth daily. Patient not taking: Reported on 01/08/2020 10/19/17   Bary Leriche, PA-C    Physical Exam: BP 136/74    Pulse 98    Temp 98 F (36.7 C) (Oral)    Resp 18    Ht 5\' 7"  (1.702 m)    Wt 114.8 kg    SpO2 97%    BMI 39.63 kg/m    General: 59 y.o. year-old male well developed well nourished in no acute distress.  Alert and oriented x 2 (person and place).  HEENT: Dry mucous membrane. NCAT, EOMI  Neck: Supple, trachea midline  Cardiovascular: Regular rate and rhythm with no rubs or gallops.  No thyromegaly or JVD noted.  No lower extremity edema. 2/4 pulses in all 4 extremities.  Respiratory: Clear to auscultation with no wheezes or rales. Good inspiratory effort.  Abdomen: Soft nontender nondistended with normal  bowel sounds x4 quadrants.  Muskuloskeletal: No cyanosis, left BKA noted. No clubbing or edema noted in RLE  Neuro: CN II-XII intact, strength, sensation, reflexes  Skin: No ulcerative lesions noted or rashes  Psychiatry: Judgement and insight appear normal. Mood is appropriate for condition and setting          Labs on Admission:  Basic Metabolic Panel: Recent Labs  Lab 01/08/20 2049  NA 119*  K 5.7*  CL 87*  CO2 19*  GLUCOSE 170*  BUN 31*  CREATININE 0.86  CALCIUM 8.9   Liver Function Tests: Recent Labs  Lab 01/08/20 2049  AST 330*  ALT 89*  ALKPHOS 79  BILITOT 1.3*  PROT 9.0*  ALBUMIN 3.2*   No results for input(s): LIPASE, AMYLASE in the last 168 hours. No results for input(s): AMMONIA in the last 168 hours. CBC: Recent Labs  Lab 01/08/20 2049  WBC 21.5*  NEUTROABS 16.5*  HGB 11.0*  HCT 32.3*  MCV 80.3  PLT 661*   Cardiac Enzymes: Recent Labs  Lab 01/08/20 2049  CKTOTAL 36,340*    BNP (last 3 results) No results for input(s): BNP in the last 8760 hours.  ProBNP (last 3 results) No results for input(s): PROBNP in the last 8760 hours.  CBG: Recent Labs  Lab 01/08/20 2036  GLUCAP 175*    Radiological Exams on Admission: CT Head Wo Contrast  Result Date: 01/08/2020 CLINICAL DATA:  Mental status change.  Multiple recent falls. EXAM: CT HEAD WITHOUT CONTRAST CT CERVICAL SPINE WITHOUT CONTRAST TECHNIQUE: Multidetector CT imaging of the head and cervical spine was performed following the standard protocol without intravenous contrast. Multiplanar CT image reconstructions of the cervical spine were also generated. COMPARISON:  Head and cervical spine CTs 08/22/2012 FINDINGS: CT HEAD FINDINGS Brain: There is no evidence of an acute infarct, intracranial hemorrhage, mass, midline shift, or extra-axial fluid collection. Patchy hypodensities in the cerebral white matter bilaterally may have mildly progressed and are nonspecific but compatible with  severe chronic small vessel ischemic disease. Mild cerebral atrophy is not abnormal for age. Vascular: Calcified atherosclerosis at the skull base. No hyperdense vessel. Skull: No acute fracture or suspicious osseous lesion. Sinuses/Orbits: The visualized paranasal sinuses and mastoid air cells are clear. Limited assessment of the orbits due to motion. Other: None. CT CERVICAL SPINE FINDINGS Alignment: Cervical spine straightening.  No listhesis. Skull base and vertebrae: No acute fracture or suspicious osseous lesion. Soft tissues and spinal canal: No prevertebral fluid or swelling. No visible canal hematoma. Disc levels: Chronic mild-to-moderate disc space narrowing at C4-5 with disc bulging  and uncovertebral spurring resulting in likely moderate spinal stenosis and moderate right and mild left neural foraminal stenosis. Upper chest: Partially visualized patchy peripheral ground-glass opacity in the right upper lobe. Other: Calcified atherosclerosis at the right greater than left carotid bifurcations. IMPRESSION: 1. No evidence of acute intracranial abnormality. 2. Severe chronic small vessel ischemic disease. 3. No evidence of acute fracture or traumatic subluxation in the cervical spine. 4. Partially visualized patchy peripheral ground-glass opacity in the right upper lobe, likely infectious/inflammatory. Correlate with pending chest radiograph. Electronically Signed   By: Logan Bores M.D.   On: 01/08/2020 21:33   CT Cervical Spine Wo Contrast  Result Date: 01/08/2020 CLINICAL DATA:  Mental status change.  Multiple recent falls. EXAM: CT HEAD WITHOUT CONTRAST CT CERVICAL SPINE WITHOUT CONTRAST TECHNIQUE: Multidetector CT imaging of the head and cervical spine was performed following the standard protocol without intravenous contrast. Multiplanar CT image reconstructions of the cervical spine were also generated. COMPARISON:  Head and cervical spine CTs 08/22/2012 FINDINGS: CT HEAD FINDINGS Brain: There is  no evidence of an acute infarct, intracranial hemorrhage, mass, midline shift, or extra-axial fluid collection. Patchy hypodensities in the cerebral white matter bilaterally may have mildly progressed and are nonspecific but compatible with severe chronic small vessel ischemic disease. Mild cerebral atrophy is not abnormal for age. Vascular: Calcified atherosclerosis at the skull base. No hyperdense vessel. Skull: No acute fracture or suspicious osseous lesion. Sinuses/Orbits: The visualized paranasal sinuses and mastoid air cells are clear. Limited assessment of the orbits due to motion. Other: None. CT CERVICAL SPINE FINDINGS Alignment: Cervical spine straightening.  No listhesis. Skull base and vertebrae: No acute fracture or suspicious osseous lesion. Soft tissues and spinal canal: No prevertebral fluid or swelling. No visible canal hematoma. Disc levels: Chronic mild-to-moderate disc space narrowing at C4-5 with disc bulging and uncovertebral spurring resulting in likely moderate spinal stenosis and moderate right and mild left neural foraminal stenosis. Upper chest: Partially visualized patchy peripheral ground-glass opacity in the right upper lobe. Other: Calcified atherosclerosis at the right greater than left carotid bifurcations. IMPRESSION: 1. No evidence of acute intracranial abnormality. 2. Severe chronic small vessel ischemic disease. 3. No evidence of acute fracture or traumatic subluxation in the cervical spine. 4. Partially visualized patchy peripheral ground-glass opacity in the right upper lobe, likely infectious/inflammatory. Correlate with pending chest radiograph. Electronically Signed   By: Logan Bores M.D.   On: 01/08/2020 21:33   CT ABDOMEN PELVIS W CONTRAST  Result Date: 01/08/2020 CLINICAL DATA:  Abdominal distension, back pain EXAM: CT ABDOMEN AND PELVIS WITH CONTRAST TECHNIQUE: Multidetector CT imaging of the abdomen and pelvis was performed using the standard protocol following  bolus administration of intravenous contrast. CONTRAST:  143mL OMNIPAQUE IOHEXOL 300 MG/ML  SOLN COMPARISON:  04/25/2010 FINDINGS: Lower chest: Bibasilar ground-glass pulmonary infiltrates are present, nonspecific. This may reflect basilar atelectasis or pulmonary edema. Extensive coronary artery calcification. Cardiac size is mildly enlarged. No pericardial effusion. Hepatobiliary: No focal liver abnormality is seen. No gallstones, gallbladder wall thickening, or biliary dilatation. Pancreas: Unremarkable Spleen: Unremarkable Adrenals/Urinary Tract: Adrenal glands are unremarkable. Kidneys are normal, without renal calculi, focal lesion, or hydronephrosis. Bladder is unremarkable. Stomach/Bowel: The stomach, small bowel, and large bowel are unremarkable. Appendix normal. No free intraperitoneal gas or fluid. Vascular/Lymphatic: Moderate atherosclerotic calcification within the infrarenal abdominal aorta. There is thrombosis of the common and external iliac artery without significant distal reconstitution of the visualized lower extremity arterial outflow clearly identified on this examination. Superimposed moderate atherosclerotic  calcification of the left lower extremity arterial outflow. No aortic aneurysm. No pathologic adenopathy within the abdomen and pelvis. Reproductive: Prostate is unremarkable. Other: Rectum unremarkable Musculoskeletal: No acute bone abnormality IMPRESSION: Bibasilar pulmonary infiltrates, poorly evaluated on this examination, possibly representing pulmonary edema. Extensive coronary artery calcification. Peripheral vascular disease with thrombosis of the left lower extremity arterial inflow. No definite reconstitution of the visualized left lower extremity arterial outflow, though this may be related to bolus timing. Noninvasive left lower extremity arterial examination would be helpful in determining the degree of arteriovascular insufficiency. Aortic Atherosclerosis (ICD10-I70.0).  Electronically Signed   By: Fidela Salisbury MD   On: 01/08/2020 22:52   DG Chest Portable 1 View  Result Date: 01/08/2020 CLINICAL DATA:  59 year old male with cough. EXAM: PORTABLE CHEST 1 VIEW COMPARISON:  Chest radiograph dated 08/22/2012. FINDINGS: Faint bilateral hazy interstitial densities primarily involving the mid to lower lung field, and left greater right, new since the prior radiograph and may represent reactive airway disease or atypical infection. Clinical correlation is recommended. No focal consolidation, pleural effusion or pneumothorax. The cardiac silhouette is within limits. No acute osseous pathology. IMPRESSION: Bilateral hazy interstitial densities may represent reactive airway disease or atypical infection. Electronically Signed   By: Anner Crete M.D.   On: 01/08/2020 21:34    EKG: I independently viewed the EKG done and my findings are as followed: Sinus rhythm at rate of 92 bpm with prolonged QTc (538ms)  Assessment/Plan Present on Admission:  Rhabdomyolysis  Hyponatremia  Thrombocytosis  Mixed hyperlipidemia  Essential hypertension, benign  Principal Problem:   Rhabdomyolysis Active Problems:   Hyponatremia   Essential hypertension, benign   Thrombocytosis   Mixed hyperlipidemia   Altered mental status   Hyperkalemia   Transaminitis   Dehydration   COVID-19 virus infection   Leukocytosis   GERD (gastroesophageal reflux disease)   Recurrent falls   Obesity (BMI 30-39.9)   Prolonged QT interval   Hyperglycemia due to diabetes mellitus (HCC)   Altered mental status possibly secondary to multifactorial Recurrent falls CT head and CT cervical spine without contrast showed no evidence of acute intracranial abnormality or any evidence of acute fracture or traumatic subluxation in the cervical spine. Patient was alert and oriented to person and place but not to time Patient states that he sustained falls while transferring from wheelchair to  another chair Continue fall precaution, neurochecks Continue PT/OT eval and treat  COVID-19 virus infection Chest x-ray showed bilateral hazy interstitial densities which may represent reactive airway disease or atypical infection Patient was hemodynamically stable and required no oxygen at baseline He denies cough, fever, chills, loss of taste/smell, though his diet has recently decreased He was empirically started on IV ceftriaxone and azithromycin, procalcitonin will be checked prior to continuing with IV antibiotics. There is currently no indication for Solu-Medrol or remdesivir Continue to monitor O2 sat and provide supplemental oxygen via [place] as needed Continue airborne isolation precaution Continue to monitor Inflammatory markers: Physician PPE:  Surgical mask with face shield, N-95, nonsterile gloves, disposable gown, head and shoe cover s Patient PPE:  Face mask   Rhabdomyolysis/dehydration Total CK 36,340 He was started on IV hydration in the ED Continue IV hydration Continue to monitor CK with morning labs  Electrolyte abnormality (hyponatremia, hyperkalemia) Na 119, corrected sodium level based on hyperglycemia is 120  Patient was started on IV hydration Serum and urine osmolality, urine sodium K+ 5.7, no peaked T wave, though patient presents with prolonged QTc Continue telemetry and  continue to monitor potassium levels with morning labs  Prolonged QTc (567ms) Avoid QT prolonging drugs Magnesium level will be checked Repeat EKG in the morning  Leukocytosis possibly reactive WBC 21.5, this may be reactive, but chest x-ray also showed bilateral pulmonary infiltrates Patient was already started on IV ceftriaxone and azithromycin, procalcitonin pending we will plan to continue IV antibiotics if elevated. Patient currently with no clinical presentation of an acute infectious process  Thrombocytosis (chronic) 661, stable  Transaminitis AST 330, ALT 89 Patient denies  any history of alcohol use and denies any abdominal pain Repeat liver enzymes in the morning and consider RUQ ultrasound if still elevated  Hyperglycemia secondary to type 2 diabetes mellitus Continue insulin sliding scale and hypoglycemia protocol Glipizide and Metformin will be held at this time  Hyperlipidemia Statin will be held at this time due to elevated liver enzymes  GERD Continue Protonix  Obesity (BMI 39.63) Patient will be counseled on diet and exercise once clinically more stable  DVT prophylaxis: Lovenox  Code Status: Full code  Family Communication: None at bedside  Disposition Plan:  Patient is from:                        home Anticipated DC to:                   SNF or family members home Anticipated DC date:               2-3 days Anticipated DC barriers:         Patient unstable for discharge at this time considering electrolyte abnormalities, prolonged QT, Covid 19 virus infection with suspicion for possible bacterial component   Consults called: None  Admission status: Inpatient    Bernadette Hoit MD [facility] Pager [phone]  If 7PM-7AM, please contact night-coverage [email or web address] Password [email or web address]  01/09/2020, 1:27 AM

## 2020-01-09 DIAGNOSIS — R296 Repeated falls: Secondary | ICD-10-CM

## 2020-01-09 DIAGNOSIS — E1165 Type 2 diabetes mellitus with hyperglycemia: Secondary | ICD-10-CM

## 2020-01-09 DIAGNOSIS — R7401 Elevation of levels of liver transaminase levels: Secondary | ICD-10-CM

## 2020-01-09 DIAGNOSIS — U071 COVID-19: Secondary | ICD-10-CM

## 2020-01-09 DIAGNOSIS — E875 Hyperkalemia: Secondary | ICD-10-CM

## 2020-01-09 DIAGNOSIS — E860 Dehydration: Secondary | ICD-10-CM

## 2020-01-09 DIAGNOSIS — E669 Obesity, unspecified: Secondary | ICD-10-CM

## 2020-01-09 DIAGNOSIS — K219 Gastro-esophageal reflux disease without esophagitis: Secondary | ICD-10-CM

## 2020-01-09 DIAGNOSIS — D72829 Elevated white blood cell count, unspecified: Secondary | ICD-10-CM

## 2020-01-09 DIAGNOSIS — R9431 Abnormal electrocardiogram [ECG] [EKG]: Secondary | ICD-10-CM

## 2020-01-09 DIAGNOSIS — R4182 Altered mental status, unspecified: Secondary | ICD-10-CM

## 2020-01-09 LAB — D-DIMER, QUANTITATIVE: D-Dimer, Quant: 3.86 ug/mL-FEU — ABNORMAL HIGH (ref 0.00–0.50)

## 2020-01-09 LAB — COMPREHENSIVE METABOLIC PANEL
ALT: 77 U/L — ABNORMAL HIGH (ref 0–44)
AST: 251 U/L — ABNORMAL HIGH (ref 15–41)
Albumin: 2.8 g/dL — ABNORMAL LOW (ref 3.5–5.0)
Alkaline Phosphatase: 69 U/L (ref 38–126)
Anion gap: 12 (ref 5–15)
BUN: 20 mg/dL (ref 8–23)
CO2: 18 mmol/L — ABNORMAL LOW (ref 22–32)
Calcium: 8.2 mg/dL — ABNORMAL LOW (ref 8.9–10.3)
Chloride: 96 mmol/L — ABNORMAL LOW (ref 98–111)
Creatinine, Ser: 0.67 mg/dL (ref 0.61–1.24)
GFR calc non Af Amer: >60 mL/min (ref >60)
Glucose, Bld: 139 mg/dL — ABNORMAL HIGH (ref 70–99)
Potassium: 4.8 mmol/L (ref 3.5–5.1)
Sodium: 126 mmol/L — ABNORMAL LOW (ref 135–145)
Total Bilirubin: 0.9 mg/dL (ref 0.3–1.2)
Total Protein: 7.8 g/dL (ref 6.5–8.1)

## 2020-01-09 LAB — HEMOGLOBIN A1C
Hgb A1c MFr Bld: 9.8 % — ABNORMAL HIGH (ref 4.8–5.6)
Mean Plasma Glucose: 234.56 mg/dL

## 2020-01-09 LAB — MAGNESIUM: Magnesium: 2.5 mg/dL — ABNORMAL HIGH (ref 1.7–2.4)

## 2020-01-09 LAB — OSMOLALITY, URINE: Osmolality, Ur: 529 mOsm/kg (ref 300–900)

## 2020-01-09 LAB — HIV ANTIBODY (ROUTINE TESTING W REFLEX): HIV Screen 4th Generation wRfx: NONREACTIVE

## 2020-01-09 LAB — OSMOLALITY: Osmolality: 280 mOsm/kg (ref 275–295)

## 2020-01-09 LAB — CBC
HCT: 28.3 % — ABNORMAL LOW (ref 39.0–52.0)
Hemoglobin: 9.6 g/dL — ABNORMAL LOW (ref 13.0–17.0)
MCH: 27.4 pg (ref 26.0–34.0)
MCHC: 33.9 g/dL (ref 30.0–36.0)
MCV: 80.6 fL (ref 80.0–100.0)
Platelets: 611 10*3/uL — ABNORMAL HIGH (ref 150–400)
RBC: 3.51 MIL/uL — ABNORMAL LOW (ref 4.22–5.81)
RDW: 13.4 % (ref 11.5–15.5)
WBC: 17.2 10*3/uL — ABNORMAL HIGH (ref 4.0–10.5)
nRBC: 0 % (ref 0.0–0.2)

## 2020-01-09 LAB — C-REACTIVE PROTEIN: CRP: 26.6 mg/dL — ABNORMAL HIGH (ref <1.0)

## 2020-01-09 LAB — CBG MONITORING, ED
Glucose-Capillary: 139 mg/dL — ABNORMAL HIGH (ref 70–99)
Glucose-Capillary: 129 mg/dL — ABNORMAL HIGH (ref 70–99)
Glucose-Capillary: 339 mg/dL — ABNORMAL HIGH (ref 70–99)

## 2020-01-09 LAB — PROTIME-INR
INR: 1.3 — ABNORMAL HIGH (ref 0.8–1.2)
Prothrombin Time: 15.9 seconds — ABNORMAL HIGH (ref 11.4–15.2)

## 2020-01-09 LAB — GLUCOSE, CAPILLARY
Glucose-Capillary: 362 mg/dL — ABNORMAL HIGH (ref 70–99)
Glucose-Capillary: 343 mg/dL — ABNORMAL HIGH (ref 70–99)

## 2020-01-09 LAB — PHOSPHORUS: Phosphorus: 3.6 mg/dL (ref 2.5–4.6)

## 2020-01-09 LAB — LACTATE DEHYDROGENASE: LDH: 689 U/L — ABNORMAL HIGH (ref 98–192)

## 2020-01-09 LAB — FERRITIN: Ferritin: 1012 ng/mL — ABNORMAL HIGH (ref 24–336)

## 2020-01-09 LAB — PROCALCITONIN: Procalcitonin: 1.33 ng/mL

## 2020-01-09 LAB — CK: Total CK: 24090 U/L — ABNORMAL HIGH (ref 49–397)

## 2020-01-09 LAB — APTT: aPTT: 42 seconds — ABNORMAL HIGH (ref 24–36)

## 2020-01-09 MED ORDER — INSULIN ASPART 100 UNIT/ML ~~LOC~~ SOLN
0.0000 [IU] | Freq: Every day | SUBCUTANEOUS | Status: DC
  Administered 2020-01-09: 4 [IU] via SUBCUTANEOUS
  Administered 2020-01-10: 3 [IU] via SUBCUTANEOUS

## 2020-01-09 MED ORDER — DEXAMETHASONE SODIUM PHOSPHATE 10 MG/ML IJ SOLN
10.0000 mg | Freq: Once | INTRAMUSCULAR | Status: AC
  Administered 2020-01-09: 10 mg via INTRAVENOUS
  Filled 2020-01-09: qty 1

## 2020-01-09 MED ORDER — DEXAMETHASONE SODIUM PHOSPHATE 10 MG/ML IJ SOLN
6.0000 mg | INTRAMUSCULAR | Status: DC
  Administered 2020-01-10 – 2020-01-12 (×3): 6 mg via INTRAVENOUS
  Filled 2020-01-09 (×3): qty 1

## 2020-01-09 MED ORDER — PNEUMOCOCCAL VAC POLYVALENT 25 MCG/0.5ML IJ INJ: 0.5000 mL | INJECTION | INTRAMUSCULAR | Status: DC

## 2020-01-09 MED ORDER — PANTOPRAZOLE SODIUM 40 MG PO TBEC
40.0000 mg | DELAYED_RELEASE_TABLET | Freq: Every day | ORAL | Status: DC
  Administered 2020-01-09 – 2020-01-17 (×9): 40 mg via ORAL
  Filled 2020-01-09 (×9): qty 1

## 2020-01-09 MED ORDER — INSULIN GLARGINE 100 UNIT/ML ~~LOC~~ SOLN
12.0000 [IU] | Freq: Every day | SUBCUTANEOUS | Status: DC
  Administered 2020-01-09: 12 [IU] via SUBCUTANEOUS
  Filled 2020-01-09 (×4): qty 0.12

## 2020-01-09 MED ORDER — INSULIN ASPART 100 UNIT/ML ~~LOC~~ SOLN
0.0000 [IU] | Freq: Three times a day (TID) | SUBCUTANEOUS | Status: DC
  Administered 2020-01-09: 15 [IU] via SUBCUTANEOUS
  Administered 2020-01-09: 11 [IU] via SUBCUTANEOUS
  Administered 2020-01-10: 5 [IU] via SUBCUTANEOUS
  Administered 2020-01-10: 11 [IU] via SUBCUTANEOUS
  Administered 2020-01-10 – 2020-01-11 (×2): 8 [IU] via SUBCUTANEOUS
  Filled 2020-01-09: qty 1

## 2020-01-09 MED ORDER — MORPHINE SULFATE (PF) 2 MG/ML IV SOLN
2.0000 mg | INTRAVENOUS | Status: AC | PRN
  Administered 2020-01-10: 2 mg via INTRAVENOUS
  Filled 2020-01-09: qty 1

## 2020-01-09 MED ORDER — OXYCODONE HCL 5 MG PO TABS
5.0000 mg | ORAL_TABLET | ORAL | Status: DC | PRN
  Administered 2020-01-09 – 2020-01-16 (×21): 5 mg via ORAL
  Filled 2020-01-09 (×21): qty 1

## 2020-01-09 MED ORDER — METOPROLOL TARTRATE 25 MG PO TABS
25.0000 mg | ORAL_TABLET | Freq: Two times a day (BID) | ORAL | Status: DC
  Administered 2020-01-09 – 2020-01-17 (×16): 25 mg via ORAL
  Filled 2020-01-09 (×17): qty 1

## 2020-01-09 MED ORDER — AMLODIPINE BESYLATE 5 MG PO TABS
5.0000 mg | ORAL_TABLET | Freq: Every day | ORAL | Status: DC
  Administered 2020-01-09 – 2020-01-17 (×9): 5 mg via ORAL
  Filled 2020-01-09 (×10): qty 1

## 2020-01-09 MED ORDER — SODIUM CHLORIDE 0.9 % IV SOLN: Freq: Once | INTRAVENOUS | Status: AC

## 2020-01-09 MED ORDER — LACTULOSE 10 GM/15ML PO SOLN
30.0000 g | Freq: Once | ORAL | Status: AC
  Administered 2020-01-09: 30 g via ORAL
  Filled 2020-01-09: qty 60

## 2020-01-09 MED ORDER — ENOXAPARIN SODIUM 40 MG/0.4ML ~~LOC~~ SOLN
40.0000 mg | SUBCUTANEOUS | Status: DC
  Administered 2020-01-09 – 2020-01-17 (×9): 40 mg via SUBCUTANEOUS
  Filled 2020-01-09 (×9): qty 0.4

## 2020-01-09 MED ORDER — SODIUM CHLORIDE 0.9 % IV SOLN: INTRAVENOUS | Status: DC

## 2020-01-09 MED ORDER — INFLUENZA VAC SPLIT QUAD 0.5 ML IM SUSY: 0.5000 mL | PREFILLED_SYRINGE | INTRAMUSCULAR | Status: DC

## 2020-01-09 NOTE — ED Notes (Signed)
Report called to [facility].  Pt eating lunch then will be transported to floor.

## 2020-01-09 NOTE — Progress Notes (Signed)
Patient Demographics:    [name], is a 59 y.o. male, DOB - [birth year], ZSM:[id number]  Admit date - 01/08/2020   Admitting Physician Bernadette Hoit, DO  Outpatient Primary MD for the patient is Vidal Schwalbe, MD  LOS - 1   Chief Complaint  Patient presents with  . Altered Mental Status        Subjective:    [name] today has no fevers, no emesis,  No chest pain,   -Becoming more awake, more interactive complaining of left stump pain   Assessment  & Plan :    Principal Problem:   Rhabdomyolysis Active Problems:   Hyponatremia   Essential hypertension, benign   Thrombocytosis   Mixed hyperlipidemia   Altered mental status   Hyperkalemia   Transaminitis   Dehydration   COVID-19 virus infection   Leukocytosis   GERD (gastroesophageal reflux disease)   Recurrent falls   Obesity (BMI 30-39.9)   Prolonged QT interval   Hyperglycemia due to diabetes mellitus [facility])  Brief Summary 59 y.o. male with medical history significant for diabetes, GERD, gout, hyperlipidemia, hypertension, osteoarthritis and left BKA admitted on 01/08/2020 with acute metabolic encephalopathy and rhabdomyolysis after being found confused and disoriented at home, he was incidentally found to be COVID-19 positive  A/p 1)Covid Pneumonia--with elevated inflammatory markers, chest x-ray with Covid pneumonia -Treat empirically with steroids -No hypoxia, hold off on remdesivir  2)Rhabdomyolysis---due to fall and prolonged immobilization CK-- 36,340 >>>24,090 -Hold Crestor --c/n to  hydrate and follow CKs  3)DM2-hold Metformin, hold glipizide  use Novolog/Humalog Sliding scale insulin with Accu-Cheks/Fingersticks as ordered  4)HTN--okay to restart amlodipine and metoprolol   5) hyponatremia due to dehydration--- should improve with IV fluids  6) acute metabolic encephalopathy--- most likely due  to dehydration in the setting of decreased oral intake due to fall and Covid infection and hyponatremia  7)Elevated LFTs-Crestor on hold, AST higher than ALT, patient denies significant alcohol use  Disposition/Need for in-Hospital Stay- patient unable to be discharged at this time due to --severe elevation of CKs and hyponatremia with dehydration requiring IV fluids*  Status is: Inpatient  Remains inpatient appropriate because:severe elevation of CKs and hyponatremia with dehydration requiring IV fluids*   Disposition: The patient is from: Home              Anticipated d/c is to: SNF              Anticipated d/c date is: 2 days              Patient currently is not medically stable to d/c. Barriers: Not Clinically Stable- severe elevation of CKs and hyponatremia with dehydration requiring IV fluids*  Code Status : full  Family Communication:   Patient is becoming more awake, more alert  Consults  :  na  DVT Prophylaxis  :  Lovenox    Lab Results  Component Value Date   PLT 611 (H) 01/09/2020    Inpatient Medications  Scheduled Meds: . [START ON 01/10/2020] dexamethasone (DECADRON) injection  6 mg Intravenous Q24H  . enoxaparin (LOVENOX) injection  40 mg Subcutaneous Q24H  . [START ON 01/10/2020] influenza vac split quadrivalent PF  0.5 mL Intramuscular Tomorrow-1000  . insulin aspart  0-15 Units Subcutaneous  TID WC  . insulin aspart  0-5 Units Subcutaneous QHS  . pantoprazole  40 mg Oral Daily  . [START ON 01/10/2020] pneumococcal 23 valent vaccine  0.5 mL Intramuscular Tomorrow-1000   Continuous Infusions: PRN Meds:.morphine injection, oxyCODONE    Anti-infectives (From admission, onward)   Start     Dose/Rate Route Frequency Ordered Stop   01/08/20 2300  cefTRIAXone (ROCEPHIN) 1 g in sodium chloride 0.9 % 100 mL IVPB        1 g 200 mL/hr over 30 Minutes Intravenous  Once 01/08/20 2257 01/08/20 2351   01/08/20 2300  azithromycin (ZITHROMAX) 500 mg in sodium chloride  0.9 % 250 mL IVPB        500 mg 250 mL/hr over 60 Minutes Intravenous  Once 01/08/20 2257 01/09/20 0054        Objective:   Vitals:   01/09/20 0830 01/09/20 1118 01/09/20 1451 01/09/20 1716  BP: 130/68 130/62 117/67 132/69  Pulse: 91 88 87 89  Resp:  16 20 20   Temp: 97.9 F (36.6 C) (!) 97.2 F (36.2 C) 98.1 F (36.7 C) 98.7 F (37.1 C)  TempSrc: Oral Oral Oral Oral  SpO2: 98% 92% 100% 96%  Weight:   96.6 kg   Height:   5\' 7"  (1.702 m)     Wt Readings from Last 3 Encounters:  01/09/20 96.6 kg  11/10/19 113.9 kg  11/10/19 113.9 kg     Intake/Output Summary (Last 24 hours) at 01/09/2020 1824 Last data filed at 01/09/2020 1700 Gross per 24 hour  Intake 1000 ml  Output 1720 ml  Net -720 ml     Physical Exam  Gen:- Awake Alert, cooperative HEENT:- [place].AT, No sclera icterus Neck-Supple Neck,No JVD,.  Lungs-  CTAB , fair symmetrical air movement CV- S1, S2 normal, regular  Abd-  +ve B.Sounds, Abd Soft, No tenderness,    Extremity/Skin:- No  edema, pedal pulses present , Lt BKA* Psych-affect is appropriate, oriented x3 Neuro-no new focal deficits, no tremors   Data Review:   Micro Results Recent Results (from the past 240 hour(s))  Resp Panel by RT PCR (RSV, Flu A&B, Covid) - Nasopharyngeal Swab     Status: Abnormal   Collection Time: 01/08/20  8:30 PM   Specimen: Nasopharyngeal Swab  Result Value Ref Range Status   SARS Coronavirus 2 by RT PCR POSITIVE (A) NEGATIVE Final    Comment: RESULT CALLED TO, READ BACK BY AND VERIFIED WITH: T WALKER,RN@2335  01/08/20 MKELLY (NOTE) SARS-CoV-2 target nucleic acids are DETECTED.  SARS-CoV-2 RNA is generally detectable in upper respiratory specimens  during the acute phase of infection. Positive results are indicative of the presence of the identified virus, but do not rule out bacterial infection or co-infection with other pathogens not detected by the test. Clinical correlation with patient history and other  diagnostic information is necessary to determine patient infection status. The expected result is Negative.  Fact Sheet for Patients:  PinkCheek.be  Fact Sheet for Healthcare Providers: GravelBags.it  This test is not yet approved or cleared by the [place] FDA and  has been authorized for detection and/or diagnosis of SARS-CoV-2 by FDA under an Emergency Use Authorization (EUA).  This EUA will remain in effect (meaning this test can be [place] ed) for the duration of  the COVID-19 declaration under Section 564(b)(1) of the Act, 21 U.S.C. section 360bbb-3(b)(1), unless the authorization is terminated or revoked sooner.      Influenza A by PCR NEGATIVE NEGATIVE Final  Influenza B by PCR NEGATIVE NEGATIVE Final    Comment: (NOTE) The Xpert Xpress SARS-CoV-2/FLU/RSV assay is intended as an aid in  the diagnosis of influenza from Nasopharyngeal swab specimens and  should not be used as a sole basis for treatment. Nasal washings and  aspirates are unacceptable for Xpert Xpress SARS-CoV-2/FLU/RSV  testing.  Fact Sheet for Patients: PinkCheek.be  Fact Sheet for Healthcare Providers: GravelBags.it  This test is not yet approved or cleared by the [place] FDA and  has been authorized for detection and/or diagnosis of SARS-CoV-2 by  FDA under an Emergency Use Authorization (EUA). This EUA will remain  in effect (meaning this test can be used) for the duration of the  Covid-19 declaration under Section 564(b)(1) of the Act, 21  U.S.C. section 360bbb-3(b)(1), unless the authorization is  terminated or revoked.    Respiratory Syncytial Virus by PCR NEGATIVE NEGATIVE Final    Comment: (NOTE) Fact Sheet for Patients: PinkCheek.be  Fact Sheet for Healthcare Providers: GravelBags.it  This test is not yet  approved or cleared by the [place] FDA and  has been authorized for detection and/or diagnosis of SARS-CoV-2 by  FDA under an Emergency Use Authorization (EUA). This EUA will remain  in effect (meaning this test can be used) for the duration of the  COVID-19 declaration under Section 564(b)(1) of the Act, 21 U.S.C.  section 360bbb-3(b)(1), unless the authorization is terminated or  revoked. Performed at [facility], [address] [place], [place] [address]     Radiology Reports CT Head Wo Contrast  Result Date: 01/08/2020 CLINICAL DATA:  Mental status change.  Multiple recent falls. EXAM: CT HEAD WITHOUT CONTRAST CT CERVICAL SPINE WITHOUT CONTRAST TECHNIQUE: Multidetector CT imaging of the head and cervical spine was performed following the standard protocol without intravenous contrast. Multiplanar CT image reconstructions of the cervical spine were also generated. COMPARISON:  Head and cervical spine CTs 08/22/2012 FINDINGS: CT HEAD FINDINGS Brain: There is no evidence of an acute infarct, intracranial hemorrhage, mass, midline shift, or extra-axial fluid collection. Patchy hypodensities in the cerebral white matter bilaterally may have mildly progressed and are nonspecific but compatible with severe chronic small vessel ischemic disease. Mild cerebral atrophy is not abnormal for age. Vascular: Calcified atherosclerosis at the skull base. No hyperdense vessel. Skull: No acute fracture or suspicious osseous lesion. Sinuses/Orbits: The visualized paranasal sinuses and mastoid air cells are clear. Limited assessment of the orbits due to motion. Other: None. CT CERVICAL SPINE FINDINGS Alignment: Cervical spine straightening.  No listhesis. Skull base and vertebrae: No acute fracture or suspicious osseous lesion. Soft tissues and spinal canal: No prevertebral fluid or swelling. No visible canal hematoma. Disc levels: Chronic mild-to-moderate disc space narrowing at C4-5 with disc bulging and  uncovertebral spurring resulting in likely moderate spinal stenosis and moderate right and mild left neural foraminal stenosis. Upper chest: Partially visualized patchy peripheral ground-glass opacity in the right upper lobe. Other: Calcified atherosclerosis at the right greater than left carotid bifurcations. IMPRESSION: 1. No evidence of acute intracranial abnormality. 2. Severe chronic small vessel ischemic disease. 3. No evidence of acute fracture or traumatic subluxation in the cervical spine. 4. Partially visualized patchy peripheral ground-glass opacity in the right upper lobe, likely infectious/inflammatory. Correlate with pending chest radiograph. Electronically Signed   By: Logan Bores M.D.   On: 01/08/2020 21:33   CT Cervical Spine Wo Contrast  Result Date: 01/08/2020 CLINICAL DATA:  Mental status change.  Multiple recent falls. EXAM: CT HEAD WITHOUT  CONTRAST CT CERVICAL SPINE WITHOUT CONTRAST TECHNIQUE: Multidetector CT imaging of the head and cervical spine was performed following the standard protocol without intravenous contrast. Multiplanar CT image reconstructions of the cervical spine were also generated. COMPARISON:  Head and cervical spine CTs 08/22/2012 FINDINGS: CT HEAD FINDINGS Brain: There is no evidence of an acute infarct, intracranial hemorrhage, mass, midline shift, or extra-axial fluid collection. Patchy hypodensities in the cerebral white matter bilaterally may have mildly progressed and are nonspecific but compatible with severe chronic small vessel ischemic disease. Mild cerebral atrophy is not abnormal for age. Vascular: Calcified atherosclerosis at the skull base. No hyperdense vessel. Skull: No acute fracture or suspicious osseous lesion. Sinuses/Orbits: The visualized paranasal sinuses and mastoid air cells are clear. Limited assessment of the orbits due to motion. Other: None. CT CERVICAL SPINE FINDINGS Alignment: Cervical spine straightening.  No listhesis. Skull base and  vertebrae: No acute fracture or suspicious osseous lesion. Soft tissues and spinal canal: No prevertebral fluid or swelling. No visible canal hematoma. Disc levels: Chronic mild-to-moderate disc space narrowing at C4-5 with disc bulging and uncovertebral spurring resulting in likely moderate spinal stenosis and moderate right and mild left neural foraminal stenosis. Upper chest: Partially visualized patchy peripheral ground-glass opacity in the right upper lobe. Other: Calcified atherosclerosis at the right greater than left carotid bifurcations. IMPRESSION: 1. No evidence of acute intracranial abnormality. 2. Severe chronic small vessel ischemic disease. 3. No evidence of acute fracture or traumatic subluxation in the cervical spine. 4. Partially visualized patchy peripheral ground-glass opacity in the right upper lobe, likely infectious/inflammatory. Correlate with pending chest radiograph. Electronically Signed   By: Logan Bores M.D.   On: 01/08/2020 21:33   CT ABDOMEN PELVIS W CONTRAST  Result Date: 01/08/2020 CLINICAL DATA:  Abdominal distension, back pain EXAM: CT ABDOMEN AND PELVIS WITH CONTRAST TECHNIQUE: Multidetector CT imaging of the abdomen and pelvis was performed using the standard protocol following bolus administration of intravenous contrast. CONTRAST:  149mL OMNIPAQUE IOHEXOL 300 MG/ML  SOLN COMPARISON:  04/25/2010 FINDINGS: Lower chest: Bibasilar ground-glass pulmonary infiltrates are present, nonspecific. This may reflect basilar atelectasis or pulmonary edema. Extensive coronary artery calcification. Cardiac size is mildly enlarged. No pericardial effusion. Hepatobiliary: No focal liver abnormality is seen. No gallstones, gallbladder wall thickening, or biliary dilatation. Pancreas: Unremarkable Spleen: Unremarkable Adrenals/Urinary Tract: Adrenal glands are unremarkable. Kidneys are normal, without renal calculi, focal lesion, or hydronephrosis. Bladder is unremarkable. Stomach/Bowel: The  stomach, small bowel, and large bowel are unremarkable. Appendix normal. No free intraperitoneal gas or fluid. Vascular/Lymphatic: Moderate atherosclerotic calcification within the infrarenal abdominal aorta. There is thrombosis of the common and external iliac artery without significant distal reconstitution of the visualized lower extremity arterial outflow clearly identified on this examination. Superimposed moderate atherosclerotic calcification of the left lower extremity arterial outflow. No aortic aneurysm. No pathologic adenopathy within the abdomen and pelvis. Reproductive: Prostate is unremarkable. Other: Rectum unremarkable Musculoskeletal: No acute bone abnormality IMPRESSION: Bibasilar pulmonary infiltrates, poorly evaluated on this examination, possibly representing pulmonary edema. Extensive coronary artery calcification. Peripheral vascular disease with thrombosis of the left lower extremity arterial inflow. No definite reconstitution of the visualized left lower extremity arterial outflow, though this may be related to bolus timing. Noninvasive left lower extremity arterial examination would be helpful in determining the degree of arteriovascular insufficiency. Aortic Atherosclerosis (ICD10-I70.0). Electronically Signed   By: Fidela Salisbury MD   On: 01/08/2020 22:52   DG Chest Portable 1 View  Result Date: 01/08/2020 CLINICAL DATA:  59 year old male  with cough. EXAM: PORTABLE CHEST 1 VIEW COMPARISON:  Chest radiograph dated 08/22/2012. FINDINGS: Faint bilateral hazy interstitial densities primarily involving the mid to lower lung field, and left greater right, new since the prior radiograph and may represent reactive airway disease or atypical infection. Clinical correlation is recommended. No focal consolidation, pleural effusion or pneumothorax. The cardiac silhouette is within limits. No acute osseous pathology. IMPRESSION: Bilateral hazy interstitial densities may represent reactive airway  disease or atypical infection. Electronically Signed   By: Anner Crete M.D.   On: 01/08/2020 21:34     CBC Recent Labs  Lab 01/08/20 2049 01/09/20 0335  WBC 21.5* 17.2*  HGB 11.0* 9.6*  HCT 32.3* 28.3*  PLT 661* 611*  MCV 80.3 80.6  MCH 27.4 27.4  MCHC 34.1 33.9  RDW 13.2 13.4  LYMPHSABS 2.1  --   MONOABS 2.3*  --   EOSABS 0.4  --   BASOSABS 0.1  --     Chemistries  Recent Labs  Lab 01/08/20 2049 01/09/20 0335  NA 119* 126*  K 5.7* 4.8  CL 87* 96*  CO2 19* 18*  GLUCOSE 170* 139*  BUN 31* 20  CREATININE 0.86 0.67  CALCIUM 8.9 8.2*  MG  --  2.5*  AST 330* 251*  ALT 89* 77*  ALKPHOS 79 69  BILITOT 1.3* 0.9   ------------------------------------------------------------------------------------------------------------------ No results for input(s): CHOL, HDL, LDLCALC, TRIG, CHOLHDL, LDLDIRECT in the last 72 hours.  Lab Results  Component Value Date   HGBA1C 9.8 (H) 01/08/2020   ------------------------------------------------------------------------------------------------------------------ No results for input(s): TSH, T4TOTAL, T3FREE, THYROIDAB in the last 72 hours.  Invalid input(s): FREET3 ------------------------------------------------------------------------------------------------------------------ Recent Labs    01/08/20 2049  FERRITIN 1,012*    Coagulation profile Recent Labs  Lab 01/09/20 0335  INR 1.3*    Recent Labs    01/09/20 0335  DDIMER 3.86*    Cardiac Enzymes No results for input(s): CKMB, TROPONINI, MYOGLOBIN in the last 168 hours.  Invalid input(s): CK ------------------------------------------------------------------------------------------------------------------ No results found for: BNP   Roxan Hockey M.D on 01/09/2020 at 6:24 PM  Go to [email or web address] - for contact info  [facility] - Office  [phone]

## 2020-01-09 NOTE — NC FL2 (Signed)
[place] LEVEL OF CARE SCREENING TOOL     IDENTIFICATION  Patient Name: [name] Birthdate: [birth year] Sex: male Admission Date (Current Location): 01/08/2020  [place] and [place] Number:  [facility] [id number] [place] and Address:  [place] [address], [place]      Provider Number: [id number]  Attending Physician Name and Address:  Roxan Hockey, MD  Relative Name and Phone Number:  Ashok Sawaya. (son) Ph: [phone]    Current Level of Care: Hospital Recommended Level of Care: [place] Prior Approval Number:    Date Approved/Denied:   PASRR Number: [id number]  Discharge Plan: SNF    Current Diagnoses: Patient Active Problem List   Diagnosis Date Noted  . Altered mental status 01/09/2020  . Hyperkalemia 01/09/2020  . Transaminitis 01/09/2020  . Dehydration 01/09/2020  . COVID-19 virus infection 01/09/2020  . Leukocytosis 01/09/2020  . GERD (gastroesophageal reflux disease) 01/09/2020  . Recurrent falls 01/09/2020  . Obesity (BMI 30-39.9) 01/09/2020  . Prolonged QT interval 01/09/2020  . Hyperglycemia due to diabetes mellitus ([place]) 01/09/2020  . Rhabdomyolysis 01/08/2020  . Encounter for screening colonoscopy 05/27/2018  . Vitamin D deficiency 02/06/2018  . Mixed hyperlipidemia 12/12/2017  . S/P BKA (below knee amputation) unilateral, left ([place]) 11/29/2017  . Unilateral complete BKA, right, sequela ([place])   . Thrombocytosis   . Constipation due to pain medication   . Below knee amputation status, left   . Unilateral complete BKA, left, initial encounter ([place])   . Acute blood loss anemia   . Post-operative pain   . Essential hypertension, benign   . Tobacco abuse   . S/P amputation   . Subacute osteomyelitis, left ankle and foot ([place])   . Acute osteomyelitis of left calcaneus ([facility])   . Cutaneous abscess of left foot   . Severe protein-calorie malnutrition ([place])   .  PAD (peripheral artery disease) ([place])   . Wound infection 10/02/2017  . Type 2 diabetes mellitus treated with insulin ([place])   . Tobacco use disorder   . Lymphedema 05/28/2017  . Essential hypertension 05/28/2017  . DM type 2 causing vascular disease ([place]) 05/28/2017  . DJD (degenerative joint disease) 05/28/2017  . Hyponatremia 04/05/2016  . Anemia 04/05/2016  . Gout 04/05/2016  . Bilateral foot pain 04/04/2016    Orientation RESPIRATION BLADDER Height & Weight     Self, Time, Situation, Place  Normal Continent Weight: 213 lb (96.6 kg) Height:  5\' 7"  (170.2 cm)  BEHAVIORAL SYMPTOMS/MOOD NEUROLOGICAL BOWEL NUTRITION STATUS      Continent Diet (Heart healthy)  AMBULATORY STATUS COMMUNICATION OF NEEDS Skin   Extensive Assist Verbally Normal                       Personal Care Assistance Level of Assistance  Bathing, Feeding, Dressing, Total care Bathing Assistance: Maximum assistance Feeding assistance: Independent Dressing Assistance: Maximum assistance Total Care Assistance: Maximum assistance   Functional Limitations Info  Sight, Hearing, Speech Sight Info: Adequate Hearing Info: Adequate Speech Info: Adequate    SPECIAL CARE FACTORS FREQUENCY  PT (By licensed PT)     PT Frequency: 5x's/week              Contractures Contractures Info: Not present    Additional Factors Info  Code Status, Allergies, Insulin Sliding Scale, Isolation Precautions Code Status Info: Full Allergies Info: NKA   Insulin Sliding Scale Info: See discharge summary Isolation Precautions Info: Patient is COVID+  Current Medications (01/09/2020):  This is the current hospital active medication list Current Facility-Administered Medications  Medication Dose Route Frequency Provider Last Rate Last Admin  . [START ON 01/10/2020] dexamethasone (DECADRON) injection 6 mg  6 mg Intravenous Q24H Emokpae, Courage, MD      . enoxaparin (LOVENOX) injection 40 mg  40 mg Subcutaneous Q24H  Adefeso, Oladapo, DO   40 mg at 01/09/20 0919  . insulin aspart (novoLOG) injection 0-15 Units  0-15 Units Subcutaneous TID WC Adefeso, Oladapo, DO   11 Units at 01/09/20 1200  . insulin aspart (novoLOG) injection 0-5 Units  0-5 Units Subcutaneous QHS Adefeso, Oladapo, DO      . pantoprazole (PROTONIX) EC tablet 40 mg  40 mg Oral Daily Adefeso, Oladapo, DO   40 mg at 01/09/20 0919     Discharge Medications: Please see discharge summary for a list of discharge medications.  Relevant Imaging Results:  Relevant Lab Results:   Additional Information SSN: [id number]  Sherie Don, LCSW

## 2020-01-09 NOTE — Plan of Care (Signed)
  Problem: Acute Rehab PT Goals(only PT should resolve) Goal: Pt Will Go Supine/Side To Sit Outcome: Progressing Flowsheets (Taken 01/09/2020 1129) Pt will go Supine/Side to Sit:  with min guard assist  with minimal assist Goal: Patient Will Transfer Sit To/From Stand Outcome: Progressing Flowsheets (Taken 01/09/2020 1129) Patient will transfer sit to/from stand:  with min guard assist  with minimal assist Goal: Pt Will Transfer Bed To Chair/Chair To Bed Outcome: Progressing Flowsheets (Taken 01/09/2020 1129) Pt will Transfer Bed to Chair/Chair to Bed:  min guard assist  with min assist Goal: Pt Will Ambulate Outcome: Progressing Flowsheets (Taken 01/09/2020 1129) Pt will Ambulate:  25 feet  with moderate assist  with minimal assist  with rolling walker   11:30 AM, 01/09/20 Lonell Grandchild, MPT Physical Therapist with [facility] [phone] office [phone] mobile phone

## 2020-01-09 NOTE — TOC Initial Note (Signed)
Transition of Care [facility]) - Initial/Assessment Note   Patient Details  Name: [name] MRN: [id number] Date of Birth: 1960-07-07  Transition of Care [facility]) CM/SW Contact:    Nathan Don, LCSW Phone Number: 01/09/2020, 3:00 PM  Clinical Narrative: Patient is a 59 year old male who was admitted for rhabdomyolysis. Patient has history of hyponatremia, HTN, thrombocytosis, hyperlipidemia, hyperkalemia, leukocytosis, GERD, recurrent falls, and DM. Patient has also tested positive for COVID. CSW spoke with patient and his son, [name]., regarding SNF and both are agreeable. FL2 completed and PASRR # received. Patient faxed out to COVID-accepting facilities. TOC to follow.  Expected Discharge Plan: Skilled Nursing Facility Barriers to Discharge: Continued Medical Work up, SNF Pending bed offer  Patient Goals and CMS Choice Patient states their goals for this hospitalization and ongoing recovery are:: Discharge to SNF for rehab CMS Medicare.gov Compare Post Acute Care list provided to:: Patient Choice offered to / list presented to : Patient  Expected Discharge Plan and Services Expected Discharge Plan: [place] In-house Referral: Clinical Social Work Discharge Planning Services: NA Post Acute Care Choice: [place] Living arrangements for the past 2 months: [place]  Prior Living Arrangements/Services Living arrangements for the past 2 months: Single Family Home Lives with:: Self Patient language and need for interpreter reviewed:: Yes Need for Family Participation in Patient Care: No (Comment) Care giver support system in place?: Yes (comment) Criminal Activity/Legal Involvement Pertinent to Current Situation/Hospitalization: No - Comment as needed  Permission Sought/Granted Permission sought to share information with : Facility Sport and exercise psychologist, Family Supports Permission granted to share information with : Yes,  Verbal Permission Granted Permission granted to share info w AGENCY: SNFs  Emotional Assessment Appearance:: Appears stated age Attitude/Demeanor/Rapport: Engaged Affect (typically observed): Accepting Orientation: : Oriented to Self, Oriented to Place, Oriented to  Time, Oriented to Situation Alcohol / Substance Use: Not Applicable Psych Involvement: No (comment)  Admission diagnosis:  Rhabdomyolysis [M62.82] Hyponatremia [E87.1] Prolonged Q-T interval on ECG [R94.31] Non-traumatic rhabdomyolysis [M62.82] Altered mental status, unspecified altered mental status type [R41.82] COVID-19 [U07.1]   Patient Active Problem List   Diagnosis Date Noted  . Altered mental status 01/09/2020  . Hyperkalemia 01/09/2020  . Transaminitis 01/09/2020  . Dehydration 01/09/2020  . COVID-19 virus infection 01/09/2020  . Leukocytosis 01/09/2020  . GERD (gastroesophageal reflux disease) 01/09/2020  . Recurrent falls 01/09/2020  . Obesity (BMI 30-39.9) 01/09/2020  . Prolonged QT interval 01/09/2020  . Hyperglycemia due to diabetes mellitus ([place]) 01/09/2020  . Rhabdomyolysis 01/08/2020  . Encounter for screening colonoscopy 05/27/2018  . Vitamin D deficiency 02/06/2018  . Mixed hyperlipidemia 12/12/2017  . S/P BKA (below knee amputation) unilateral, left ([place]) 11/29/2017  . Unilateral complete BKA, right, sequela ([place])   . Thrombocytosis   . Constipation due to pain medication   . Below knee amputation status, left   . Unilateral complete BKA, left, initial encounter ([place])   . Acute blood loss anemia   . Post-operative pain   . Essential hypertension, benign   . Tobacco abuse   . S/P amputation   . Subacute osteomyelitis, left ankle and foot ([place])   . Acute osteomyelitis of left calcaneus ([facility])   . Cutaneous abscess of left foot   . Severe protein-calorie malnutrition ([place])   . PAD (peripheral artery disease) ([place])   . Wound infection 10/02/2017  . Type 2 diabetes mellitus treated with  insulin ([place])   . Tobacco use disorder   . Lymphedema  05/28/2017  . Essential hypertension 05/28/2017  . DM type 2 causing vascular disease ([place]) 05/28/2017  . DJD (degenerative joint disease) 05/28/2017  . Hyponatremia 04/05/2016  . Anemia 04/05/2016  . Gout 04/05/2016  . Bilateral foot pain 04/04/2016   PCP:  Nathan Schwalbe, MD Pharmacy:   [place], [place] - [address] [place] [address] Phone: [phone] Fax: [phone]  Readmission Risk Interventions No flowsheet data found.

## 2020-01-09 NOTE — ED Notes (Signed)
Assumed care of pt at 0700 

## 2020-01-09 NOTE — Evaluation (Signed)
Physical Therapy Evaluation Patient Details Name: [name] MRN: [id number] DOB: [birth year] Today's Date: 01/09/2020   History of Present Illness  [name] is a 59 y.o. male with medical history significant for diabetes, GERD, gout, hyperlipidemia, hypertension, osteoarthritis and left BKA who presents to the emergency department  due to  altered mental status. Patient was unable to provide detailed history on why he came to the emergency department, he states that he came due to high blood glucose level. Patient also states that he fell a couple of times when he tried to transfer from his wheelchair to another chair. Most of the history was obtained from ED PA and ED medical chart. Per report, patient symptoms started about 2 weeks ago with a change in behavior, he lives alone and was able to take care of his ADLs including cooking his own food, but he has not been doing this recently. Patient's son checked on him today and found him naked on the floor, patient stated that he laid there all night per medical record (unusual behavior at baseline), he was taken to his PCP where he had a negative strep screen and pending labs and urinalysis. Patient was found on the floor 2 more times when checked upon by son today. His medications were found in unusual places in the house and is unknown if patient has been compliant with his medication (different from baseline where patient usually takes his medication). His urine was noted to be dark and foul-smelling today, he was then brought to the ED for further evaluation and management. Patient denies fever, chills, nausea, vomiting or abdominal pain. He denies history of tobacco or alcohol use.    Clinical Impression  Patient demonstrates slow labored movement for sitting up at bedside with Min/mod assist, required Mod/max assist to don/doff LLE BKA prosthetic leg, at high risk for falls and limited to a few side steps at bedside mostly due to  c/o increased pain with weightbearing on LLE.  Patient put back to bed after therapy.  Patient will benefit from continued physical therapy in hospital and recommended venue below to increase strength, balance, endurance for safe ADLs and gait.     Follow Up Recommendations SNF    Equipment Recommendations  None recommended by PT    Recommendations for Other Services       Precautions / Restrictions Precautions Precautions: Fall Restrictions Weight Bearing Restrictions: No      Mobility  Bed Mobility Overal bed mobility: Needs Assistance Bed Mobility: Supine to Sit;Sit to Supine     Supine to sit: Min assist;Mod assist Sit to supine: Min assist;Mod assist   General bed mobility comments: increased time, labored movement  Transfers Overall transfer level: Needs assistance Equipment used: Rolling walker (2 wheeled) Transfers: Sit to/from Stand Sit to Stand: Mod assist         General transfer comment: poor tolerance for weightbearing on left stump due to increased  Ambulation/Gait Ambulation/Gait assistance: Mod assist;Max assist Gait Distance (Feet): 3 Feet Assistive device: Rolling walker (2 wheeled) Gait Pattern/deviations: Decreased step length - right;Decreased step length - left;Decreased stance time - left;Decreased stride length Gait velocity: slow   General Gait Details: limited to 3-4 slow labored unsteady steps at bedside mostly due to increased pain with weighbearing on left stump using prosthetic leg  Stairs            Wheelchair Mobility    Modified Rankin (Stroke Patients Only)       Balance Overall balance assessment:  Needs assistance Sitting-balance support: No upper extremity supported;Feet supported Sitting balance-Leahy Scale: Fair Sitting balance - Comments: fair/good seated at bedside   Standing balance support: Bilateral upper extremity supported;During functional activity Standing balance-Leahy Scale: Poor Standing balance  comment: using RW                             Pertinent Vitals/Pain Pain Assessment: Faces Faces Pain Scale: Hurts even more Pain Location: LLE Pain Descriptors / Indicators: Discomfort;Grimacing;Guarding;Sore Pain Intervention(s): Limited activity within patient's tolerance;Monitored during session;Repositioned    Home Living Family/patient expects to be discharged to:: Private residence Living Arrangements: Alone Available Help at Discharge: Family;Available PRN/intermittently Type of Home: Mobile home Home Access: Ramped entrance     Home Layout: One level Home Equipment: [place] - 2 wheels;Cane - single point;Bedside commode;Shower seat;Wheelchair - manual      Prior Function Level of Independence: Independent with assistive device(s)         Comments: Community ambulator using LLE BKA prosthetic leg, drives     Hand Dominance        Extremity/Trunk Assessment   Upper Extremity Assessment Upper Extremity Assessment: Generalized weakness    Lower Extremity Assessment Lower Extremity Assessment: Generalized weakness    Cervical / Trunk Assessment Cervical / Trunk Assessment: Normal  Communication   Communication: No difficulties  Cognition Arousal/Alertness: Awake/alert Behavior During Therapy: WFL for tasks assessed/performed Overall Cognitive Status: Within Functional Limits for tasks assessed                                        General Comments      Exercises     Assessment/Plan    PT Assessment Patient needs continued PT services  PT Problem List Decreased strength;Decreased activity tolerance;Decreased balance;Decreased mobility       PT Treatment Interventions DME instruction;Gait training;Stair training;Functional mobility training;Therapeutic activities;Therapeutic exercise;Balance training;Patient/family education    PT Goals (Current goals can be found in the Care Plan section)  Acute Rehab PT  Goals Patient Stated Goal: return home after rehab PT Goal Formulation: With patient Time For Goal Achievement: 01/23/20 Potential to Achieve Goals: Good    Frequency Min 3X/week   Barriers to discharge        Co-evaluation               AM-PAC PT "6 Clicks" Mobility  Outcome Measure Help needed turning from your back to your side while in a flat bed without using bedrails?: A Lot Help needed moving from lying on your back to sitting on the side of a flat bed without using bedrails?: A Lot Help needed moving to and from a bed to a chair (including a wheelchair)?: A Lot Help needed standing up from a chair using your arms (e.g., wheelchair or bedside chair)?: A Lot Help needed to walk in hospital room?: A Lot Help needed climbing 3-5 steps with a railing? : Total 6 Click Score: 11    End of Session   Activity Tolerance: Patient tolerated treatment well;Patient limited by fatigue;Patient limited by pain Patient left: in bed;with call bell/phone within reach Nurse Communication: Mobility status PT Visit Diagnosis: Unsteadiness on feet (R26.81);Other abnormalities of gait and mobility (R26.89);Muscle weakness (generalized) (M62.81);History of falling (Z91.81)    Time: 3557-3220 PT Time Calculation (min) (ACUTE ONLY): 31 min   Charges:   PT Evaluation $PT  Eval Moderate Complexity: 1 Mod PT Treatments $Therapeutic Activity: 23-37 mins        11:19 AM, 01/09/20 Lonell Grandchild, MPT Physical Therapist with [facility] [phone] office [phone] mobile phone

## 2020-01-09 NOTE — ED Notes (Signed)
Pt sitting up in bed eating breakfast

## 2020-01-09 NOTE — Plan of Care (Signed)

## 2020-01-10 LAB — CBC
HCT: 28.1 % — ABNORMAL LOW (ref 39.0–52.0)
Hemoglobin: 9.5 g/dL — ABNORMAL LOW (ref 13.0–17.0)
MCH: 27.5 pg (ref 26.0–34.0)
MCHC: 33.8 g/dL (ref 30.0–36.0)
MCV: 81.4 fL (ref 80.0–100.0)
Platelets: 525 10*3/uL — ABNORMAL HIGH (ref 150–400)
RBC: 3.45 MIL/uL — ABNORMAL LOW (ref 4.22–5.81)
RDW: 13.5 % (ref 11.5–15.5)
WBC: 19.4 10*3/uL — ABNORMAL HIGH (ref 4.0–10.5)
nRBC: 0 % (ref 0.0–0.2)

## 2020-01-10 LAB — COMPREHENSIVE METABOLIC PANEL
ALT: 66 U/L — ABNORMAL HIGH (ref 0–44)
AST: 116 U/L — ABNORMAL HIGH (ref 15–41)
Albumin: 2.6 g/dL — ABNORMAL LOW (ref 3.5–5.0)
Alkaline Phosphatase: 85 U/L (ref 38–126)
Anion gap: 9 (ref 5–15)
BUN: 11 mg/dL (ref 8–23)
CO2: 21 mmol/L — ABNORMAL LOW (ref 22–32)
Calcium: 8.5 mg/dL — ABNORMAL LOW (ref 8.9–10.3)
Chloride: 99 mmol/L (ref 98–111)
Creatinine, Ser: 0.57 mg/dL — ABNORMAL LOW (ref 0.61–1.24)
GFR, Estimated: >60 mL/min (ref >60)
Glucose, Bld: 258 mg/dL — ABNORMAL HIGH (ref 70–99)
Potassium: 4.6 mmol/L (ref 3.5–5.1)
Sodium: 129 mmol/L — ABNORMAL LOW (ref 135–145)
Total Bilirubin: 0.5 mg/dL (ref 0.3–1.2)
Total Protein: 7.6 g/dL (ref 6.5–8.1)

## 2020-01-10 LAB — GLUCOSE, CAPILLARY
Glucose-Capillary: 237 mg/dL — ABNORMAL HIGH (ref 70–99)
Glucose-Capillary: 331 mg/dL — ABNORMAL HIGH (ref 70–99)
Glucose-Capillary: 287 mg/dL — ABNORMAL HIGH (ref 70–99)
Glucose-Capillary: 291 mg/dL — ABNORMAL HIGH (ref 70–99)

## 2020-01-10 LAB — CK: Total CK: 11129 U/L — ABNORMAL HIGH (ref 49–397)

## 2020-01-10 MED ORDER — INSULIN GLARGINE 100 UNIT/ML ~~LOC~~ SOLN
12.0000 [IU] | Freq: Two times a day (BID) | SUBCUTANEOUS | Status: DC
  Administered 2020-01-10 – 2020-01-11 (×3): 12 [IU] via SUBCUTANEOUS
  Filled 2020-01-10 (×5): qty 0.12

## 2020-01-10 MED ORDER — ASCORBIC ACID 500 MG PO TABS
500.0000 mg | ORAL_TABLET | Freq: Every day | ORAL | Status: DC
  Administered 2020-01-11 – 2020-01-17 (×7): 500 mg via ORAL
  Filled 2020-01-10 (×7): qty 1

## 2020-01-10 MED ORDER — ZINC SULFATE 220 (50 ZN) MG PO CAPS
220.0000 mg | ORAL_CAPSULE | Freq: Every day | ORAL | Status: DC
  Administered 2020-01-11 – 2020-01-17 (×7): 220 mg via ORAL
  Filled 2020-01-10 (×7): qty 1

## 2020-01-10 NOTE — Progress Notes (Signed)
Patient constantly yelling out, pulled off telemetry x 2, condom cath, pinched iv line in half and pulled out iv. Safety mitts placed on patient

## 2020-01-10 NOTE — Progress Notes (Addendum)
Patient Demographics:    [name], is a 59 y.o. male, DOB - 1960/08/31, TZG:[id number]  Admit date - 01/08/2020   Admitting Physician Bernadette Hoit, DO  Outpatient Primary MD for the patient is Vidal Schwalbe, MD  LOS - 2   Chief Complaint  Patient presents with  . Altered Mental Status        Subjective:    [name] today has no fevers, no emesis,  No chest pain,    =-Requesting more food   Assessment  & Plan :    Principal Problem:   Rhabdomyolysis Active Problems:   Hyponatremia   Essential hypertension, benign   Thrombocytosis   Mixed hyperlipidemia   Altered mental status   Hyperkalemia   Transaminitis   Dehydration   COVID-19 virus infection   Leukocytosis   GERD (gastroesophageal reflux disease)   Recurrent falls   Obesity (BMI 30-39.9)   Prolonged QT interval   Hyperglycemia due to diabetes mellitus [facility])  Brief Summary 59 y.o. male with medical history significant for diabetes, GERD, gout, hyperlipidemia, hypertension, osteoarthritis and left BKA admitted on 01/08/2020 with acute metabolic encephalopathy and rhabdomyolysis after being found confused and disoriented at home, he was incidentally found to be COVID-19 positive  A/p 1)Covid Pneumonia--with elevated inflammatory markers, chest x-ray with Covid pneumonia Continue IV steroids -No hypoxia, hold off on remdesivir  2)Rhabdomyolysis---due to fall and prolonged immobilization CK-- 36,340 >>>24,090>> 11,129 -Hold Crestor --c/n to  hydrate and follow CKs  3)DM2-hold Metformin, hold glipizide  use Novolog/Humalog Sliding scale insulin with Accu-Cheks/Fingersticks as ordered  4)HTN--continue amlodipine and metoprolol   5) hyponatremia due to dehydration--- sodium is up to 129 from 119, continue IV fluids  6) acute metabolic encephalopathy--- most likely due to dehydration in the setting of  decreased oral intake due to fall and Covid infection and hyponatremia -Mentation significantly improved hydration  7)Elevated LFTs-Crestor on hold, AST higher than ALT, patient denies significant alcohol use  Disposition/Need for in-Hospital Stay- patient unable to be discharged at this time due to --severe elevation of CKs and hyponatremia with dehydration requiring IV fluids*  Status is: Inpatient  Remains inpatient appropriate because:severe elevation of CKs and hyponatremia with dehydration requiring IV fluids*   Disposition: The patient is from: Home              Anticipated d/c is to: SNF              Anticipated d/c date is: 2 days              Patient currently is not medically stable to d/c. Barriers: Not Clinically Stable- severe elevation of CKs and hyponatremia with dehydration requiring IV fluids*  Code Status : full  Family Communication:   Patient is awake, more alert Discussed with pt's son by phone  Consults  :  na  DVT Prophylaxis  :  Lovenox    Lab Results  Component Value Date   PLT 525 (H) 01/10/2020    Inpatient Medications  Scheduled Meds: . amLODipine  5 mg Oral Daily  . dexamethasone (DECADRON) injection  6 mg Intravenous Q24H  . enoxaparin (LOVENOX) injection  40 mg Subcutaneous Q24H  . influenza vac split quadrivalent PF  0.5 mL Intramuscular Tomorrow-1000  .  insulin aspart  0-15 Units Subcutaneous TID WC  . insulin aspart  0-5 Units Subcutaneous QHS  . insulin glargine  12 Units Subcutaneous BID  . metoprolol tartrate  25 mg Oral BID  . pantoprazole  40 mg Oral Daily  . pneumococcal 23 valent vaccine  0.5 mL Intramuscular Tomorrow-1000   Continuous Infusions: . sodium chloride 150 mL/hr at 01/10/20 1059   PRN Meds:.morphine injection, oxyCODONE    Anti-infectives (From admission, onward)   Start     Dose/Rate Route Frequency Ordered Stop   01/08/20 2300  cefTRIAXone (ROCEPHIN) 1 g in sodium chloride 0.9 % 100 mL IVPB        1 g 200  mL/hr over 30 Minutes Intravenous  Once 01/08/20 2257 01/08/20 2351   01/08/20 2300  azithromycin (ZITHROMAX) 500 mg in sodium chloride 0.9 % 250 mL IVPB        500 mg 250 mL/hr over 60 Minutes Intravenous  Once 01/08/20 2257 01/09/20 0054        Objective:   Vitals:   01/09/20 1716 01/09/20 2050 01/10/20 0404 01/10/20 1455  BP: 132/69 129/69 126/71 123/74  Pulse: 89 95 78 73  Resp: 20 20 18 16   Temp: 98.7 F (37.1 C) 99.1 F (37.3 C) 98.1 F (36.7 C) 98.4 F (36.9 C)  TempSrc: Oral Oral    SpO2: 96% 96% 97% 99%  Weight:      Height:        Wt Readings from Last 3 Encounters:  01/09/20 96.6 kg  11/10/19 113.9 kg  11/10/19 113.9 kg     Intake/Output Summary (Last 24 hours) at 01/10/2020 1840 Last data filed at 01/10/2020 1700 Gross per 24 hour  Intake 2342.1 ml  Output 3300 ml  Net -957.9 ml     Physical Exam  Gen:- Awake Alert, cooperative HEENT:- [place].AT, No sclera icterus Neck-Supple Neck,No JVD,.  Lungs-  CTAB , fair symmetrical air movement CV- S1, S2 normal, regular  Abd-  +ve B.Sounds, Abd Soft, No tenderness,    Extremity/Skin:- No  edema, pedal pulses present , Lt BKA* Psych-affect is appropriate, oriented x3 Neuro-no new focal deficits, no tremors   Data Review:   Micro Results Recent Results (from the past 240 hour(s))  Resp Panel by RT PCR (RSV, Flu A&B, Covid) - Nasopharyngeal Swab     Status: Abnormal   Collection Time: 01/08/20  8:30 PM   Specimen: Nasopharyngeal Swab  Result Value Ref Range Status   SARS Coronavirus 2 by RT PCR POSITIVE (A) NEGATIVE Final    Comment: RESULT CALLED TO, READ BACK BY AND VERIFIED WITH: T WALKER,RN@2335  01/08/20 MKELLY (NOTE) SARS-CoV-2 target nucleic acids are DETECTED.  SARS-CoV-2 RNA is generally detectable in upper respiratory specimens  during the acute phase of infection. Positive results are indicative of the presence of the identified virus, but do not rule out bacterial infection or co-infection  with other pathogens not detected by the test. Clinical correlation with patient history and other diagnostic information is necessary to determine patient infection status. The expected result is Negative.  Fact Sheet for Patients:  PinkCheek.be  Fact Sheet for Healthcare Providers: GravelBags.it  This test is not yet approved or cleared by the [place] FDA and  has been authorized for detection and/or diagnosis of SARS-CoV-2 by FDA under an Emergency Use Authorization (EUA).  This EUA will remain in effect (meaning this test can be [place] ed) for the duration of  the COVID-19 declaration under Section 564(b)(1) of the  Act, 21 U.S.C. section 360bbb-3(b)(1), unless the authorization is terminated or revoked sooner.      Influenza A by PCR NEGATIVE NEGATIVE Final   Influenza B by PCR NEGATIVE NEGATIVE Final    Comment: (NOTE) The Xpert Xpress SARS-CoV-2/FLU/RSV assay is intended as an aid in  the diagnosis of influenza from Nasopharyngeal swab specimens and  should not be used as a sole basis for treatment. Nasal washings and  aspirates are unacceptable for Xpert Xpress SARS-CoV-2/FLU/RSV  testing.  Fact Sheet for Patients: PinkCheek.be  Fact Sheet for Healthcare Providers: GravelBags.it  This test is not yet approved or cleared by the [place] FDA and  has been authorized for detection and/or diagnosis of SARS-CoV-2 by  FDA under an Emergency Use Authorization (EUA). This EUA will remain  in effect (meaning this test can be used) for the duration of the  Covid-19 declaration under Section 564(b)(1) of the Act, 21  U.S.C. section 360bbb-3(b)(1), unless the authorization is  terminated or revoked.    Respiratory Syncytial Virus by PCR NEGATIVE NEGATIVE Final    Comment: (NOTE) Fact Sheet for Patients: PinkCheek.be  Fact  Sheet for Healthcare Providers: GravelBags.it  This test is not yet approved or cleared by the [place] FDA and  has been authorized for detection and/or diagnosis of SARS-CoV-2 by  FDA under an Emergency Use Authorization (EUA). This EUA will remain  in effect (meaning this test can be used) for the duration of the  COVID-19 declaration under Section 564(b)(1) of the Act, 21 U.S.C.  section 360bbb-3(b)(1), unless the authorization is terminated or  revoked. Performed at [facility], [address] [place], [place] [address]     Radiology Reports CT Head Wo Contrast  Result Date: 01/08/2020 CLINICAL DATA:  Mental status change.  Multiple recent falls. EXAM: CT HEAD WITHOUT CONTRAST CT CERVICAL SPINE WITHOUT CONTRAST TECHNIQUE: Multidetector CT imaging of the head and cervical spine was performed following the standard protocol without intravenous contrast. Multiplanar CT image reconstructions of the cervical spine were also generated. COMPARISON:  Head and cervical spine CTs 08/22/2012 FINDINGS: CT HEAD FINDINGS Brain: There is no evidence of an acute infarct, intracranial hemorrhage, mass, midline shift, or extra-axial fluid collection. Patchy hypodensities in the cerebral white matter bilaterally may have mildly progressed and are nonspecific but compatible with severe chronic small vessel ischemic disease. Mild cerebral atrophy is not abnormal for age. Vascular: Calcified atherosclerosis at the skull base. No hyperdense vessel. Skull: No acute fracture or suspicious osseous lesion. Sinuses/Orbits: The visualized paranasal sinuses and mastoid air cells are clear. Limited assessment of the orbits due to motion. Other: None. CT CERVICAL SPINE FINDINGS Alignment: Cervical spine straightening.  No listhesis. Skull base and vertebrae: No acute fracture or suspicious osseous lesion. Soft tissues and spinal canal: No prevertebral fluid or swelling. No visible canal  hematoma. Disc levels: Chronic mild-to-moderate disc space narrowing at C4-5 with disc bulging and uncovertebral spurring resulting in likely moderate spinal stenosis and moderate right and mild left neural foraminal stenosis. Upper chest: Partially visualized patchy peripheral ground-glass opacity in the right upper lobe. Other: Calcified atherosclerosis at the right greater than left carotid bifurcations. IMPRESSION: 1. No evidence of acute intracranial abnormality. 2. Severe chronic small vessel ischemic disease. 3. No evidence of acute fracture or traumatic subluxation in the cervical spine. 4. Partially visualized patchy peripheral ground-glass opacity in the right upper lobe, likely infectious/inflammatory. Correlate with pending chest radiograph. Electronically Signed   By: Logan Bores M.D.   On:  01/08/2020 21:33   CT Cervical Spine Wo Contrast  Result Date: 01/08/2020 CLINICAL DATA:  Mental status change.  Multiple recent falls. EXAM: CT HEAD WITHOUT CONTRAST CT CERVICAL SPINE WITHOUT CONTRAST TECHNIQUE: Multidetector CT imaging of the head and cervical spine was performed following the standard protocol without intravenous contrast. Multiplanar CT image reconstructions of the cervical spine were also generated. COMPARISON:  Head and cervical spine CTs 08/22/2012 FINDINGS: CT HEAD FINDINGS Brain: There is no evidence of an acute infarct, intracranial hemorrhage, mass, midline shift, or extra-axial fluid collection. Patchy hypodensities in the cerebral white matter bilaterally may have mildly progressed and are nonspecific but compatible with severe chronic small vessel ischemic disease. Mild cerebral atrophy is not abnormal for age. Vascular: Calcified atherosclerosis at the skull base. No hyperdense vessel. Skull: No acute fracture or suspicious osseous lesion. Sinuses/Orbits: The visualized paranasal sinuses and mastoid air cells are clear. Limited assessment of the orbits due to motion. Other: None.  CT CERVICAL SPINE FINDINGS Alignment: Cervical spine straightening.  No listhesis. Skull base and vertebrae: No acute fracture or suspicious osseous lesion. Soft tissues and spinal canal: No prevertebral fluid or swelling. No visible canal hematoma. Disc levels: Chronic mild-to-moderate disc space narrowing at C4-5 with disc bulging and uncovertebral spurring resulting in likely moderate spinal stenosis and moderate right and mild left neural foraminal stenosis. Upper chest: Partially visualized patchy peripheral ground-glass opacity in the right upper lobe. Other: Calcified atherosclerosis at the right greater than left carotid bifurcations. IMPRESSION: 1. No evidence of acute intracranial abnormality. 2. Severe chronic small vessel ischemic disease. 3. No evidence of acute fracture or traumatic subluxation in the cervical spine. 4. Partially visualized patchy peripheral ground-glass opacity in the right upper lobe, likely infectious/inflammatory. Correlate with pending chest radiograph. Electronically Signed   By: Logan Bores M.D.   On: 01/08/2020 21:33   CT ABDOMEN PELVIS W CONTRAST  Result Date: 01/08/2020 CLINICAL DATA:  Abdominal distension, back pain EXAM: CT ABDOMEN AND PELVIS WITH CONTRAST TECHNIQUE: Multidetector CT imaging of the abdomen and pelvis was performed using the standard protocol following bolus administration of intravenous contrast. CONTRAST:  168mL OMNIPAQUE IOHEXOL 300 MG/ML  SOLN COMPARISON:  04/25/2010 FINDINGS: Lower chest: Bibasilar ground-glass pulmonary infiltrates are present, nonspecific. This may reflect basilar atelectasis or pulmonary edema. Extensive coronary artery calcification. Cardiac size is mildly enlarged. No pericardial effusion. Hepatobiliary: No focal liver abnormality is seen. No gallstones, gallbladder wall thickening, or biliary dilatation. Pancreas: Unremarkable Spleen: Unremarkable Adrenals/Urinary Tract: Adrenal glands are unremarkable. Kidneys are normal,  without renal calculi, focal lesion, or hydronephrosis. Bladder is unremarkable. Stomach/Bowel: The stomach, small bowel, and large bowel are unremarkable. Appendix normal. No free intraperitoneal gas or fluid. Vascular/Lymphatic: Moderate atherosclerotic calcification within the infrarenal abdominal aorta. There is thrombosis of the common and external iliac artery without significant distal reconstitution of the visualized lower extremity arterial outflow clearly identified on this examination. Superimposed moderate atherosclerotic calcification of the left lower extremity arterial outflow. No aortic aneurysm. No pathologic adenopathy within the abdomen and pelvis. Reproductive: Prostate is unremarkable. Other: Rectum unremarkable Musculoskeletal: No acute bone abnormality IMPRESSION: Bibasilar pulmonary infiltrates, poorly evaluated on this examination, possibly representing pulmonary edema. Extensive coronary artery calcification. Peripheral vascular disease with thrombosis of the left lower extremity arterial inflow. No definite reconstitution of the visualized left lower extremity arterial outflow, though this may be related to bolus timing. Noninvasive left lower extremity arterial examination would be helpful in determining the degree of arteriovascular insufficiency. Aortic Atherosclerosis (ICD10-I70.0). Electronically Signed  By: Fidela Salisbury MD   On: 01/08/2020 22:52   DG Chest Portable 1 View  Result Date: 01/08/2020 CLINICAL DATA:  59 year old male with cough. EXAM: PORTABLE CHEST 1 VIEW COMPARISON:  Chest radiograph dated 08/22/2012. FINDINGS: Faint bilateral hazy interstitial densities primarily involving the mid to lower lung field, and left greater right, new since the prior radiograph and may represent reactive airway disease or atypical infection. Clinical correlation is recommended. No focal consolidation, pleural effusion or pneumothorax. The cardiac silhouette is within limits. No acute  osseous pathology. IMPRESSION: Bilateral hazy interstitial densities may represent reactive airway disease or atypical infection. Electronically Signed   By: Anner Crete M.D.   On: 01/08/2020 21:34     CBC Recent Labs  Lab 01/08/20 2049 01/09/20 0335 01/10/20 0721  WBC 21.5* 17.2* 19.4*  HGB 11.0* 9.6* 9.5*  HCT 32.3* 28.3* 28.1*  PLT 661* 611* 525*  MCV 80.3 80.6 81.4  MCH 27.4 27.4 27.5  MCHC 34.1 33.9 33.8  RDW 13.2 13.4 13.5  LYMPHSABS 2.1  --   --   MONOABS 2.3*  --   --   EOSABS 0.4  --   --   BASOSABS 0.1  --   --     Chemistries  Recent Labs  Lab 01/08/20 2049 01/09/20 0335 01/10/20 0721  NA 119* 126* 129*  K 5.7* 4.8 4.6  CL 87* 96* 99  CO2 19* 18* 21*  GLUCOSE 170* 139* 258*  BUN 31* 20 11  CREATININE 0.86 0.67 0.57*  CALCIUM 8.9 8.2* 8.5*  MG  --  2.5*  --   AST 330* 251* 116*  ALT 89* 77* 66*  ALKPHOS 79 69 85  BILITOT 1.3* 0.9 0.5   ------------------------------------------------------------------------------------------------------------------ No results for input(s): CHOL, HDL, LDLCALC, TRIG, CHOLHDL, LDLDIRECT in the last 72 hours.  Lab Results  Component Value Date   HGBA1C 9.8 (H) 01/08/2020   ------------------------------------------------------------------------------------------------------------------ No results for input(s): TSH, T4TOTAL, T3FREE, THYROIDAB in the last 72 hours.  Invalid input(s): FREET3 ------------------------------------------------------------------------------------------------------------------ Recent Labs    01/08/20 2049  FERRITIN 1,012*    Coagulation profile Recent Labs  Lab 01/09/20 0335  INR 1.3*    Recent Labs    01/09/20 0335  DDIMER 3.86*    Cardiac Enzymes No results for input(s): CKMB, TROPONINI, MYOGLOBIN in the last 168 hours.  Invalid input(s): CK ------------------------------------------------------------------------------------------------------------------ No results  found for: BNP   Roxan Hockey M.D on 01/10/2020 at 6:40 PM  Go to [email or web address] - for contact info  [facility] - Office  [phone]

## 2020-01-11 LAB — GLUCOSE, CAPILLARY
Glucose-Capillary: 293 mg/dL — ABNORMAL HIGH (ref 70–99)
Glucose-Capillary: 295 mg/dL — ABNORMAL HIGH (ref 70–99)
Glucose-Capillary: 284 mg/dL — ABNORMAL HIGH (ref 70–99)
Glucose-Capillary: 370 mg/dL — ABNORMAL HIGH (ref 70–99)

## 2020-01-11 LAB — CK: Total CK: 7131 U/L — ABNORMAL HIGH (ref 49–397)

## 2020-01-11 MED ORDER — TRAZODONE HCL 50 MG PO TABS
50.0000 mg | ORAL_TABLET | Freq: Once | ORAL | Status: AC
  Administered 2020-01-11: 50 mg via ORAL
  Filled 2020-01-11: qty 1

## 2020-01-11 MED ORDER — INSULIN ASPART 100 UNIT/ML ~~LOC~~ SOLN
0.0000 [IU] | Freq: Every day | SUBCUTANEOUS | Status: DC
  Administered 2020-01-11: 5 [IU] via SUBCUTANEOUS
  Administered 2020-01-12 – 2020-01-13 (×2): 3 [IU] via SUBCUTANEOUS
  Administered 2020-01-14 – 2020-01-15 (×2): 2 [IU] via SUBCUTANEOUS
  Administered 2020-01-16: 4 [IU] via SUBCUTANEOUS

## 2020-01-11 MED ORDER — INSULIN GLARGINE 100 UNIT/ML ~~LOC~~ SOLN
15.0000 [IU] | Freq: Two times a day (BID) | SUBCUTANEOUS | Status: DC
  Administered 2020-01-11: 15 [IU] via SUBCUTANEOUS
  Filled 2020-01-11 (×4): qty 0.15

## 2020-01-11 MED ORDER — INSULIN ASPART 100 UNIT/ML ~~LOC~~ SOLN
0.0000 [IU] | Freq: Three times a day (TID) | SUBCUTANEOUS | Status: DC
  Administered 2020-01-11 (×2): 11 [IU] via SUBCUTANEOUS
  Administered 2020-01-12 (×3): 20 [IU] via SUBCUTANEOUS

## 2020-01-11 NOTE — Progress Notes (Signed)
Patient Demographics:    [name], is a 59 y.o. male, DOB - 01-14-1961, VVZ:[id number]  Admit date - 01/08/2020   Admitting Physician Bernadette Hoit, DO  Outpatient Primary MD for the patient is Vidal Schwalbe, MD  LOS - 3   Chief Complaint  Patient presents with  . Altered Mental Status        Subjective:    [name] today has no fevers, no emesis,  No chest pain,    --No cough, no dyspnea, no hypoxia -Intermittent confusion and disorientation overnight -Voiding well   Assessment  & Plan :    Principal Problem:   Rhabdomyolysis Active Problems:   Hyponatremia   Essential hypertension, benign   Thrombocytosis   Mixed hyperlipidemia   Altered mental status   Hyperkalemia   Transaminitis   Dehydration   COVID-19 virus infection   Leukocytosis   GERD (gastroesophageal reflux disease)   Recurrent falls   Obesity (BMI 30-39.9)   Prolonged QT interval   Hyperglycemia due to diabetes mellitus [facility])  Brief Summary 59 y.o. male with medical history significant for diabetes, GERD, gout, hyperlipidemia, hypertension, osteoarthritis and left BKA admitted on 01/08/2020 with acute metabolic encephalopathy and rhabdomyolysis after being found confused and disoriented at home, he was incidentally found to be COVID-19 positive  A/p 1)Covid Pneumonia--with elevated inflammatory markers, chest x-ray with Covid pneumonia Continue IV steroids ---No cough, no dyspnea, no hypoxia hold off on remdesivir COVID-19 Labs  Recent Labs    01/08/20 2049 01/09/20 0335  DDIMER  --  3.86*  FERRITIN 1,012*  --   LDH  --  689*  CRP 26.6*  --     Lab Results  Component Value Date   SARSCOV2NAA POSITIVE (A) 01/08/2020   [place] NEGATIVE 11/25/2018     2)Rhabdomyolysis---due to fall and prolonged immobilization CK-- 36,340 >>>24,090>> 11,129>>7,131 -Hold Crestor --c/n to   Hydrate iv and orally and follow CKs  3)DM2-recent A1c 9.8 reflecting uncontrolled diabetes with hyperglycemia hold Metformin, hold glipizide  -Glycemic control is worse due to steroids for Covid pneumonia -Increase Lantus insulin to 15 units twice daily use Novolog/Humalog Sliding scale insulin with Accu-Cheks/Fingersticks as ordered  4)HTN--continue amlodipine and metoprolol   5) hyponatremia due to dehydration--- sodium is up to 129 from 119, continue IV fluids  6) acute metabolic encephalopathy--- most likely due to dehydration in the setting of decreased oral intake due to fall and Covid infection and hyponatremia -Mentation significantly improved hydration -Patient still having occasional disorientation and confusion especially at night  7)Elevated LFTs-Crestor on hold, AST higher than ALT, patient denies significant alcohol use -Repeat in a.m.  8) generalized weakness and deconditioning--- PT eval appreciated recommends SNF rehab  --Patient and son are agreeable with SNF placement for rehab  Disposition/Need for in-Hospital Stay- patient unable to be discharged at this time due to --severe elevation of CKs and hyponatremia with dehydration requiring IV fluids*  Status is: Inpatient  Remains inpatient appropriate because:severe elevation of CKs and hyponatremia with dehydration requiring IV fluids*   Disposition: The patient is from: Home              Anticipated d/c is to: SNF              Anticipated  d/c date is: 2 days              Patient currently is not medically stable to d/c. Barriers: Not Clinically Stable- severe elevation of CKs and hyponatremia with dehydration requiring IV fluids*  Code Status : full  Family Communication:   Patient is awake, more alert Discussed with pt's son by phone  Consults  :  na  DVT Prophylaxis  :  Lovenox    Lab Results  Component Value Date   PLT 525 (H) 01/10/2020    Inpatient Medications  Scheduled Meds: . amLODipine   5 mg Oral Daily  . vitamin C  500 mg Oral Daily  . dexamethasone (DECADRON) injection  6 mg Intravenous Q24H  . enoxaparin (LOVENOX) injection  40 mg Subcutaneous Q24H  . influenza vac split quadrivalent PF  0.5 mL Intramuscular Tomorrow-1000  . insulin aspart  0-20 Units Subcutaneous TID WC  . insulin aspart  0-5 Units Subcutaneous QHS  . insulin glargine  15 Units Subcutaneous BID  . metoprolol tartrate  25 mg Oral BID  . pantoprazole  40 mg Oral Daily  . pneumococcal 23 valent vaccine  0.5 mL Intramuscular Tomorrow-1000  . zinc sulfate  220 mg Oral Daily   Continuous Infusions: . sodium chloride 150 mL/hr at 01/11/20 0145   PRN Meds:.morphine injection, oxyCODONE    Anti-infectives (From admission, onward)   Start     Dose/Rate Route Frequency Ordered Stop   01/08/20 2300  cefTRIAXone (ROCEPHIN) 1 g in sodium chloride 0.9 % 100 mL IVPB        1 g 200 mL/hr over 30 Minutes Intravenous  Once 01/08/20 2257 01/08/20 2351   01/08/20 2300  azithromycin (ZITHROMAX) 500 mg in sodium chloride 0.9 % 250 mL IVPB        500 mg 250 mL/hr over 60 Minutes Intravenous  Once 01/08/20 2257 01/09/20 0054        Objective:   Vitals:   01/10/20 1455 01/10/20 2043 01/11/20 0612 01/11/20 0830  BP: 123/74 125/61 (!) 110/59 118/65  Pulse: 73 75 68 65  Resp: 16 18 18 18   Temp: 98.4 F (36.9 C) 98.4 F (36.9 C) 98.5 F (36.9 C) 98.5 F (36.9 C)  TempSrc:  Oral  Oral  SpO2: 99% 100% 95% 99%  Weight:      Height:        Wt Readings from Last 3 Encounters:  01/09/20 96.6 kg  11/10/19 113.9 kg  11/10/19 113.9 kg     Intake/Output Summary (Last 24 hours) at 01/11/2020 1036 Last data filed at 01/11/2020 0500 Gross per 24 hour  Intake 3830.56 ml  Output 3450 ml  Net 380.56 ml     Physical Exam  Gen:- Awake Alert, cooperative HEENT:- [place].AT, No sclera icterus Neck-Supple Neck,No JVD,.  Lungs-  CTAB , fair symmetrical air movement CV- S1, S2 normal, regular  Abd-  +ve B.Sounds,  Abd Soft, No tenderness,    Extremity/Skin:- No  edema, pedal pulses present , Lt BKA Psych-affect is appropriate, oriented x3 most of the time, occasional episodes of disorientation and confusion especially in the evenings Neuro-generalized weakness, no new focal deficits, no tremors   Data Review:   Micro Results Recent Results (from the past 240 hour(s))  Resp Panel by RT PCR (RSV, Flu A&B, Covid) - Nasopharyngeal Swab     Status: Abnormal   Collection Time: 01/08/20  8:30 PM   Specimen: Nasopharyngeal Swab  Result Value Ref Range Status  SARS Coronavirus 2 by RT PCR POSITIVE (A) NEGATIVE Final    Comment: RESULT CALLED TO, READ BACK BY AND VERIFIED WITH: T WALKER,RN@2335  01/08/20 MKELLY (NOTE) SARS-CoV-2 target nucleic acids are DETECTED.  SARS-CoV-2 RNA is generally detectable in upper respiratory specimens  during the acute phase of infection. Positive results are indicative of the presence of the identified virus, but do not rule out bacterial infection or co-infection with other pathogens not detected by the test. Clinical correlation with patient history and other diagnostic information is necessary to determine patient infection status. The expected result is Negative.  Fact Sheet for Patients:  PinkCheek.be  Fact Sheet for Healthcare Providers: GravelBags.it  This test is not yet approved or cleared by the [place] FDA and  has been authorized for detection and/or diagnosis of SARS-CoV-2 by FDA under an Emergency Use Authorization (EUA).  This EUA will remain in effect (meaning this test can be [place] ed) for the duration of  the COVID-19 declaration under Section 564(b)(1) of the Act, 21 U.S.C. section 360bbb-3(b)(1), unless the authorization is terminated or revoked sooner.      Influenza A by PCR NEGATIVE NEGATIVE Final   Influenza B by PCR NEGATIVE NEGATIVE Final    Comment: (NOTE) The Xpert Xpress  SARS-CoV-2/FLU/RSV assay is intended as an aid in  the diagnosis of influenza from Nasopharyngeal swab specimens and  should not be used as a sole basis for treatment. Nasal washings and  aspirates are unacceptable for Xpert Xpress SARS-CoV-2/FLU/RSV  testing.  Fact Sheet for Patients: PinkCheek.be  Fact Sheet for Healthcare Providers: GravelBags.it  This test is not yet approved or cleared by the [place] FDA and  has been authorized for detection and/or diagnosis of SARS-CoV-2 by  FDA under an Emergency Use Authorization (EUA). This EUA will remain  in effect (meaning this test can be used) for the duration of the  Covid-19 declaration under Section 564(b)(1) of the Act, 21  U.S.C. section 360bbb-3(b)(1), unless the authorization is  terminated or revoked.    Respiratory Syncytial Virus by PCR NEGATIVE NEGATIVE Final    Comment: (NOTE) Fact Sheet for Patients: PinkCheek.be  Fact Sheet for Healthcare Providers: GravelBags.it  This test is not yet approved or cleared by the [place] FDA and  has been authorized for detection and/or diagnosis of SARS-CoV-2 by  FDA under an Emergency Use Authorization (EUA). This EUA will remain  in effect (meaning this test can be used) for the duration of the  COVID-19 declaration under Section 564(b)(1) of the Act, 21 U.S.C.  section 360bbb-3(b)(1), unless the authorization is terminated or  revoked. Performed at [facility], [address] [place], [place] [address]     Radiology Reports CT Head Wo Contrast  Result Date: 01/08/2020 CLINICAL DATA:  Mental status change.  Multiple recent falls. EXAM: CT HEAD WITHOUT CONTRAST CT CERVICAL SPINE WITHOUT CONTRAST TECHNIQUE: Multidetector CT imaging of the head and cervical spine was performed following the standard protocol without intravenous contrast. Multiplanar CT  image reconstructions of the cervical spine were also generated. COMPARISON:  Head and cervical spine CTs 08/22/2012 FINDINGS: CT HEAD FINDINGS Brain: There is no evidence of an acute infarct, intracranial hemorrhage, mass, midline shift, or extra-axial fluid collection. Patchy hypodensities in the cerebral white matter bilaterally may have mildly progressed and are nonspecific but compatible with severe chronic small vessel ischemic disease. Mild cerebral atrophy is not abnormal for age. Vascular: Calcified atherosclerosis at the skull base. No hyperdense vessel. Skull: No acute fracture  or suspicious osseous lesion. Sinuses/Orbits: The visualized paranasal sinuses and mastoid air cells are clear. Limited assessment of the orbits due to motion. Other: None. CT CERVICAL SPINE FINDINGS Alignment: Cervical spine straightening.  No listhesis. Skull base and vertebrae: No acute fracture or suspicious osseous lesion. Soft tissues and spinal canal: No prevertebral fluid or swelling. No visible canal hematoma. Disc levels: Chronic mild-to-moderate disc space narrowing at C4-5 with disc bulging and uncovertebral spurring resulting in likely moderate spinal stenosis and moderate right and mild left neural foraminal stenosis. Upper chest: Partially visualized patchy peripheral ground-glass opacity in the right upper lobe. Other: Calcified atherosclerosis at the right greater than left carotid bifurcations. IMPRESSION: 1. No evidence of acute intracranial abnormality. 2. Severe chronic small vessel ischemic disease. 3. No evidence of acute fracture or traumatic subluxation in the cervical spine. 4. Partially visualized patchy peripheral ground-glass opacity in the right upper lobe, likely infectious/inflammatory. Correlate with pending chest radiograph. Electronically Signed   By: Logan Bores M.D.   On: 01/08/2020 21:33   CT Cervical Spine Wo Contrast  Result Date: 01/08/2020 CLINICAL DATA:  Mental status change.   Multiple recent falls. EXAM: CT HEAD WITHOUT CONTRAST CT CERVICAL SPINE WITHOUT CONTRAST TECHNIQUE: Multidetector CT imaging of the head and cervical spine was performed following the standard protocol without intravenous contrast. Multiplanar CT image reconstructions of the cervical spine were also generated. COMPARISON:  Head and cervical spine CTs 08/22/2012 FINDINGS: CT HEAD FINDINGS Brain: There is no evidence of an acute infarct, intracranial hemorrhage, mass, midline shift, or extra-axial fluid collection. Patchy hypodensities in the cerebral white matter bilaterally may have mildly progressed and are nonspecific but compatible with severe chronic small vessel ischemic disease. Mild cerebral atrophy is not abnormal for age. Vascular: Calcified atherosclerosis at the skull base. No hyperdense vessel. Skull: No acute fracture or suspicious osseous lesion. Sinuses/Orbits: The visualized paranasal sinuses and mastoid air cells are clear. Limited assessment of the orbits due to motion. Other: None. CT CERVICAL SPINE FINDINGS Alignment: Cervical spine straightening.  No listhesis. Skull base and vertebrae: No acute fracture or suspicious osseous lesion. Soft tissues and spinal canal: No prevertebral fluid or swelling. No visible canal hematoma. Disc levels: Chronic mild-to-moderate disc space narrowing at C4-5 with disc bulging and uncovertebral spurring resulting in likely moderate spinal stenosis and moderate right and mild left neural foraminal stenosis. Upper chest: Partially visualized patchy peripheral ground-glass opacity in the right upper lobe. Other: Calcified atherosclerosis at the right greater than left carotid bifurcations. IMPRESSION: 1. No evidence of acute intracranial abnormality. 2. Severe chronic small vessel ischemic disease. 3. No evidence of acute fracture or traumatic subluxation in the cervical spine. 4. Partially visualized patchy peripheral ground-glass opacity in the right upper lobe,  likely infectious/inflammatory. Correlate with pending chest radiograph. Electronically Signed   By: Logan Bores M.D.   On: 01/08/2020 21:33   CT ABDOMEN PELVIS W CONTRAST  Result Date: 01/08/2020 CLINICAL DATA:  Abdominal distension, back pain EXAM: CT ABDOMEN AND PELVIS WITH CONTRAST TECHNIQUE: Multidetector CT imaging of the abdomen and pelvis was performed using the standard protocol following bolus administration of intravenous contrast. CONTRAST:  121mL OMNIPAQUE IOHEXOL 300 MG/ML  SOLN COMPARISON:  04/25/2010 FINDINGS: Lower chest: Bibasilar ground-glass pulmonary infiltrates are present, nonspecific. This may reflect basilar atelectasis or pulmonary edema. Extensive coronary artery calcification. Cardiac size is mildly enlarged. No pericardial effusion. Hepatobiliary: No focal liver abnormality is seen. No gallstones, gallbladder wall thickening, or biliary dilatation. Pancreas: Unremarkable Spleen: Unremarkable Adrenals/Urinary Tract:  Adrenal glands are unremarkable. Kidneys are normal, without renal calculi, focal lesion, or hydronephrosis. Bladder is unremarkable. Stomach/Bowel: The stomach, small bowel, and large bowel are unremarkable. Appendix normal. No free intraperitoneal gas or fluid. Vascular/Lymphatic: Moderate atherosclerotic calcification within the infrarenal abdominal aorta. There is thrombosis of the common and external iliac artery without significant distal reconstitution of the visualized lower extremity arterial outflow clearly identified on this examination. Superimposed moderate atherosclerotic calcification of the left lower extremity arterial outflow. No aortic aneurysm. No pathologic adenopathy within the abdomen and pelvis. Reproductive: Prostate is unremarkable. Other: Rectum unremarkable Musculoskeletal: No acute bone abnormality IMPRESSION: Bibasilar pulmonary infiltrates, poorly evaluated on this examination, possibly representing pulmonary edema. Extensive coronary artery  calcification. Peripheral vascular disease with thrombosis of the left lower extremity arterial inflow. No definite reconstitution of the visualized left lower extremity arterial outflow, though this may be related to bolus timing. Noninvasive left lower extremity arterial examination would be helpful in determining the degree of arteriovascular insufficiency. Aortic Atherosclerosis (ICD10-I70.0). Electronically Signed   By: Fidela Salisbury MD   On: 01/08/2020 22:52   DG Chest Portable 1 View  Result Date: 01/08/2020 CLINICAL DATA:  59 year old male with cough. EXAM: PORTABLE CHEST 1 VIEW COMPARISON:  Chest radiograph dated 08/22/2012. FINDINGS: Faint bilateral hazy interstitial densities primarily involving the mid to lower lung field, and left greater right, new since the prior radiograph and may represent reactive airway disease or atypical infection. Clinical correlation is recommended. No focal consolidation, pleural effusion or pneumothorax. The cardiac silhouette is within limits. No acute osseous pathology. IMPRESSION: Bilateral hazy interstitial densities may represent reactive airway disease or atypical infection. Electronically Signed   By: Anner Crete M.D.   On: 01/08/2020 21:34     CBC Recent Labs  Lab 01/08/20 2049 01/09/20 0335 01/10/20 0721  WBC 21.5* 17.2* 19.4*  HGB 11.0* 9.6* 9.5*  HCT 32.3* 28.3* 28.1*  PLT 661* 611* 525*  MCV 80.3 80.6 81.4  MCH 27.4 27.4 27.5  MCHC 34.1 33.9 33.8  RDW 13.2 13.4 13.5  LYMPHSABS 2.1  --   --   MONOABS 2.3*  --   --   EOSABS 0.4  --   --   BASOSABS 0.1  --   --     Chemistries  Recent Labs  Lab 01/08/20 2049 01/09/20 0335 01/10/20 0721  NA 119* 126* 129*  K 5.7* 4.8 4.6  CL 87* 96* 99  CO2 19* 18* 21*  GLUCOSE 170* 139* 258*  BUN 31* 20 11  CREATININE 0.86 0.67 0.57*  CALCIUM 8.9 8.2* 8.5*  MG  --  2.5*  --   AST 330* 251* 116*  ALT 89* 77* 66*  ALKPHOS 79 69 85  BILITOT 1.3* 0.9 0.5    ------------------------------------------------------------------------------------------------------------------ No results for input(s): CHOL, HDL, LDLCALC, TRIG, CHOLHDL, LDLDIRECT in the last 72 hours.  Lab Results  Component Value Date   HGBA1C 9.8 (H) 01/08/2020   ------------------------------------------------------------------------------------------------------------------ No results for input(s): TSH, T4TOTAL, T3FREE, THYROIDAB in the last 72 hours.  Invalid input(s): FREET3 ------------------------------------------------------------------------------------------------------------------ Recent Labs    01/08/20 2049  FERRITIN 1,012*    Coagulation profile Recent Labs  Lab 01/09/20 0335  INR 1.3*    Recent Labs    01/09/20 0335  DDIMER 3.86*    Cardiac Enzymes No results for input(s): CKMB, TROPONINI, MYOGLOBIN in the last 168 hours.  Invalid input(s): CK ------------------------------------------------------------------------------------------------------------------ No results found for: BNP   Roxan Hockey M.D on 01/11/2020 at 10:36 AM  Go to  [email or web address] - for contact info  [facility] - Office  [phone]

## 2020-01-11 NOTE — Progress Notes (Signed)
Patient continues to pull at IV lines and telemetry monitoring. Patient has managed to remove mitts. Patient has also has removed gown multiple times this shift. Redirection ineffective.

## 2020-01-12 LAB — COMPREHENSIVE METABOLIC PANEL
ALT: 56 U/L — ABNORMAL HIGH (ref 0–44)
AST: 59 U/L — ABNORMAL HIGH (ref 15–41)
Albumin: 2.4 g/dL — ABNORMAL LOW (ref 3.5–5.0)
Alkaline Phosphatase: 106 U/L (ref 38–126)
Anion gap: 10 (ref 5–15)
BUN: 12 mg/dL (ref 8–23)
CO2: 24 mmol/L (ref 22–32)
Calcium: 8.7 mg/dL — ABNORMAL LOW (ref 8.9–10.3)
Chloride: 96 mmol/L — ABNORMAL LOW (ref 98–111)
Creatinine, Ser: 0.7 mg/dL (ref 0.61–1.24)
GFR, Estimated: >60 mL/min (ref >60)
Glucose, Bld: 503 mg/dL (ref 70–99)
Potassium: 3.9 mmol/L (ref 3.5–5.1)
Sodium: 130 mmol/L — ABNORMAL LOW (ref 135–145)
Total Bilirubin: 0.4 mg/dL (ref 0.3–1.2)
Total Protein: 7.4 g/dL (ref 6.5–8.1)

## 2020-01-12 LAB — CK: Total CK: 5815 U/L — ABNORMAL HIGH (ref 49–397)

## 2020-01-12 LAB — GLUCOSE, CAPILLARY
Glucose-Capillary: 554 mg/dL (ref 70–99)
Glucose-Capillary: 418 mg/dL — ABNORMAL HIGH (ref 70–99)
Glucose-Capillary: 378 mg/dL — ABNORMAL HIGH (ref 70–99)
Glucose-Capillary: 278 mg/dL — ABNORMAL HIGH (ref 70–99)

## 2020-01-12 MED ORDER — INSULIN GLARGINE 100 UNIT/ML ~~LOC~~ SOLN
20.0000 [IU] | Freq: Two times a day (BID) | SUBCUTANEOUS | Status: DC
  Administered 2020-01-12 (×2): 20 [IU] via SUBCUTANEOUS
  Filled 2020-01-12 (×5): qty 0.2

## 2020-01-12 NOTE — TOC Progression Note (Signed)
Transition of Care [facility]) - Progression Note    Patient Details  Name: [name] MRN: [id number] Date of Birth: Dec 29, 1960  [facility]) CM/SW Contact  Ihor Gully, LCSW Phone Number: 01/12/2020, 4:24 PM  Clinical Narrative:    Patient son updated on lack of bed offers. Discussed referral  Being sent to [facility] in [place].  Son discussed that patient typically moved about his home in his WC. He would put his prosthetic leg on daily and sit in his wc.  Patient previously had OPPT in [place] and was able to ambulate with a cane upon completion.  Discussed that HHPT may have to be an option if no SNF beds are available.  TOC will continue to attempt to locate COVID+ accepting SNF.    Expected Discharge Plan: [place] Barriers to Discharge: Continued Medical Work up, SNF Pending bed offer  Expected Discharge Plan and Services Expected Discharge Plan: [place] In-house Referral: Clinical Social Work Discharge Planning Services: NA Post Acute Care Choice: [place] Living arrangements for the past 2 months: Single Family Home                                       Social Determinants of Health (SDOH) Interventions    Readmission Risk Interventions No flowsheet data found.

## 2020-01-12 NOTE — Plan of Care (Signed)

## 2020-01-12 NOTE — Progress Notes (Signed)
CRITICAL VALUE ALERT  Critical Value:  CBG 554  Date & Time Notied:  01/12/2020 at [place]  Provider Notified: Dr. Roxan Hockey  Orders Received/Actions taken: awaiting orders

## 2020-01-12 NOTE — Progress Notes (Signed)
Patient has pulled out multiple IVs. I spoke with Dr. Joesph Fillers and he said that we could leave the IV out for now. Patient is eating and drinking.

## 2020-01-12 NOTE — Progress Notes (Addendum)
Inpatient Diabetes Program Recommendations  AACE/ADA: New Consensus Statement on Inpatient Glycemic Control  Target Ranges:  Prepandial:   less than 140 mg/dL      Peak postprandial:   less than 180 mg/dL (1-2 hours)      Critically ill patients:  140 - 180 mg/dL   Results for [name], [name] (MRN [id number]) as of 01/12/2020 11:27  Ref. Range 01/11/2020 07:41 01/11/2020 11:36 01/11/2020 16:33 01/11/2020 22:05 01/12/2020 08:31 01/12/2020 11:11  Glucose-Capillary Latest Ref Range: 70 - 99 mg/dL 293 (H) 295 (H) 284 (H) 370 (H) 554 (HH) 418 (H)  Results for [name], [name] (MRN [id number]) as of 01/12/2020 11:27  Ref. Range 08/06/2019 00:00 01/08/2020 20:49  Hemoglobin A1C Latest Ref Range: 4.8 - 5.6 % 12 9.8 (H)   Review of Glycemic Control  Diabetes history: DM2 Outpatient Diabetes medications: Lantus 100 units daily, Novolog 24-30 units TID with meals, Glipizide 10 mg daily, Metformin XR 500 mg BID Current orders for Inpatient glycemic control: Lantus 20 units BID, Novolog 0-20 units TID with meals, Novolog 0-5 units QHS; Decadron 6 mg Q24H  Inpatient Diabetes Program Recommendations:    Insulin: If steroids are continued as ordered, please consider ordering Novolog 10 units TID with meals for meal coverage if patient eats at least 50% of meals.  NOTE: In reviewing chart, noted patient sees Dr. Dorris [name] (last seen 11/10/19) and per office note by Dr. Dorris [name] and patient was prescribed Lantus 100 units QHS, Novolog 24-30 units TID with meals, Metformin XR 500 mg BID, and Glipizide 10 mg QAM for DM management.  Patient admitted with AMS, COVID, and rhabdomyolysis. Noted A1C 9.8% on 01/08/20 (improved from 12% in May). Fasting glucose 554 mg/dl this morning and noted Lantus was increased today. Patient is currently ordered steroids which are contributing to hyperglycemia. Recommend adding meal coverage insulin.  Addendum 01/12/20'@13' :28-Spoke with patient over the phone briefly. When asked about  outpatient DM medications patient states that he does not know how much insulin he takes or what pills he takes and reported that his son takes care of all his medications for him. Patient stated that his son could be called to get more information. Call patient's son [name]. [name] reports that patient lives alone and patient gives himself his insulin.  He reports that patient's pills are prepackaged from pharmacy. [name] contributed information to conversation as she takes him to his doctor appointments and helps him. She reported that patient is suppose to take Lantus 100 units QHS and Novolog 24-30 units TID with meals and that the Novolog scale was changed at last appointment with Dr. Dorris [name]. [name] reports that over the past week but had only checked glucose one time because he was sick. Inquired about how much Novolog he would take with meals since he was not checking glucose. [name] was not sure but notes that his father takes the insulin consistently. Discussed that currently patient is ordered steroids and glucose is running higher today. Discussed importance of patient taking medications consistently and checking glucose 3-4 times a day. Inquired about knowledge of a [employer] [name] states that she is aware of the [employer] and when they checked on it in the past that it was not covered by Medicaid. Encouraged her to ask about rechecking with insurance to see if it is covered now since [place] is covering for patients if certain criteria is met. Discussed how the [employer] could provide more information on DM control and help Dr. Dorris [name] with making  further adjustments with DM medications if needed. Noted patient will likely be discharged to SNF for rehab. Encouraged [name] and his [name] to assist patient with medications to ensure he is getting them and taking them consistently.  [name] verbalized understanding of information discussed and states that he has no  questions at this time related to his father's DM.   Thanks, Barnie Alderman, RN, MSN, CDE Diabetes Coordinator Inpatient Diabetes Program [phone] (Team Pager from 8am to 5pm)

## 2020-01-12 NOTE — Progress Notes (Addendum)
Patient Demographics:    [name], is a 59 y.o. male, DOB - 1961-04-03, BCW:[id number]  Admit date - 01/08/2020   Admitting Physician Bernadette Hoit, DO  Outpatient Primary MD for the patient is Vidal Schwalbe, MD  LOS - 4   Chief Complaint  Patient presents with  . Altered Mental Status        Subjective:    [name] today has no fevers, no emesis,  No chest pain,    --No cough, no dyspnea, no hypoxia -Patient remains intermittently not cooperative -Patient has been noncompliant with diabetic/low sugar diet--he has been eating a lot of sweets and now blood sugars are elevated  --Patient pulled out his IVs so he has not gotten as much IV fluids as he should   Assessment  & Plan :    Principal Problem:   Rhabdomyolysis Active Problems:   Hyponatremia   Essential hypertension, benign   Thrombocytosis   Mixed hyperlipidemia   Altered mental status   Hyperkalemia   Transaminitis   Dehydration   COVID-19 virus infection   Leukocytosis   GERD (gastroesophageal reflux disease)   Recurrent falls   Obesity (BMI 30-39.9)   Prolonged QT interval   Hyperglycemia due to diabetes mellitus [facility])  Brief Summary 59 y.o. male with medical history significant for diabetes, GERD, gout, hyperlipidemia, hypertension, osteoarthritis and left BKA admitted on 01/08/2020 with acute metabolic encephalopathy and rhabdomyolysis after being found confused and disoriented at home, he was incidentally found to be COVID-19 positive  A/p 1)Covid Pneumonia--with elevated inflammatory markers, chest x-ray with Covid pneumonia Continue IV steroids ---No cough, no dyspnea, no hypoxia hold off on remdesivir COVID-19 Labs  No results for input(s): DDIMER, FERRITIN, LDH, CRP in the last 72 hours.  Lab Results  Component Value Date   SARSCOV2NAA POSITIVE (A) 01/08/2020   [place] NEGATIVE  11/25/2018     2)Rhabdomyolysis---due to fall and prolonged immobilization CK-- 36,340 >>>24,090>> 11,129>>7,131>>5,815 -Hold Crestor --c/n to  Hydrate iv and orally and follow CKs  3)DM2-recent A1c 9.8 reflecting uncontrolled diabetes with hyperglycemia hold Metformin, hold glipizide  -Glycemic control is worse due to steroids for Covid pneumonia -Increase Lantus insulin further  to 20 units twice daily use Novolog/Humalog Sliding scale insulin with Accu-Cheks/Fingersticks as ordered  4)HTN--continue amlodipine and metoprolol   5)Hyponatremia due to dehydration--- sodium is up to 130 from 119, continue IV fluids --Patient pulled out his IVs so he has not gotten as much IV fluids as he should as  6) acute metabolic encephalopathy--- most likely due to dehydration in the setting of decreased oral intake due to fall and Covid infection and hyponatremia -Mentation significantly improved hydration -Patient still having occasional disorientation and confusion especially at night  7)Elevated LFTs-Crestor on hold, AST higher than ALT, patient denies significant alcohol use AST 330>>251>>116>>59 ALT 89>>77>>66>>56 T Bili-WNL  8)Generalized weakness and deconditioning--- PT eval appreciated recommends SNF rehab  --Patient and son are agreeable with SNF placement for rehab  Disposition/Need for in-Hospital Stay- patient unable to be discharged at this time due to --severe elevation of CKs and hyponatremia with dehydration requiring IV fluids*  Status is: Inpatient  Remains inpatient appropriate because:severe elevation of CKs and hyponatremia with dehydration requiring IV fluids*   Disposition: The  patient is from: Home              Anticipated d/c is to: SNF              Anticipated d/c date is: 2 days              Patient currently is not medically stable to d/c. Barriers: Not Clinically Stable- severe elevation of CKs and hyponatremia with dehydration requiring IV fluids*  Code  Status : full  Family Communication:   Patient is awake, more alert Discussed with pt's son by phone  Consults  :  na  DVT Prophylaxis  :  Lovenox    Lab Results  Component Value Date   PLT 525 (H) 01/10/2020    Inpatient Medications  Scheduled Meds: . amLODipine  5 mg Oral Daily  . vitamin C  500 mg Oral Daily  . dexamethasone (DECADRON) injection  6 mg Intravenous Q24H  . enoxaparin (LOVENOX) injection  40 mg Subcutaneous Q24H  . influenza vac split quadrivalent PF  0.5 mL Intramuscular Tomorrow-1000  . insulin aspart  0-20 Units Subcutaneous TID WC  . insulin aspart  0-5 Units Subcutaneous QHS  . insulin glargine  20 Units Subcutaneous BID  . metoprolol tartrate  25 mg Oral BID  . pantoprazole  40 mg Oral Daily  . pneumococcal 23 valent vaccine  0.5 mL Intramuscular Tomorrow-1000  . zinc sulfate  220 mg Oral Daily   Continuous Infusions: . sodium chloride 75 mL/hr at 01/11/20 1203   PRN Meds:.morphine injection, oxyCODONE    Anti-infectives (From admission, onward)   Start     Dose/Rate Route Frequency Ordered Stop   01/08/20 2300  cefTRIAXone (ROCEPHIN) 1 g in sodium chloride 0.9 % 100 mL IVPB        1 g 200 mL/hr over 30 Minutes Intravenous  Once 01/08/20 2257 01/08/20 2351   01/08/20 2300  azithromycin (ZITHROMAX) 500 mg in sodium chloride 0.9 % 250 mL IVPB        500 mg 250 mL/hr over 60 Minutes Intravenous  Once 01/08/20 2257 01/09/20 0054        Objective:   Vitals:   01/11/20 0830 01/11/20 2048 01/12/20 0508 01/12/20 1450  BP: 118/65 134/69 130/75 136/63  Pulse: 65 79 83 73  Resp: 18 18 17 16   Temp: 98.5 F (36.9 C) 98.7 F (37.1 C) 98.5 F (36.9 C) 99.4 F (37.4 C)  TempSrc: Oral  Oral   SpO2: 99% 98% 97% 99%  Weight:      Height:        Wt Readings from Last 3 Encounters:  01/09/20 96.6 kg  11/10/19 113.9 kg  11/10/19 113.9 kg     Intake/Output Summary (Last 24 hours) at 01/12/2020 1537 Last data filed at 01/12/2020 0800 Gross per  24 hour  Intake 3882.26 ml  Output 3700 ml  Net 182.26 ml    Physical Exam  Gen:- Awake Alert, in no apparent distress  HEENT:- [place].AT, No sclera icterus Neck-Supple Neck,No JVD,.  Lungs-  CTAB , fair symmetrical air movement CV- S1, S2 normal, regular  Abd-  +ve B.Sounds, Abd Soft, No tenderness,    Extremity/Skin:- No  edema, pedal pulses present , Lt BKA Psych-affect is appropriate, oriented x3 most of the time, occasional episodes of disorientation and confusion especially in the evenings Neuro-generalized weakness, no new focal deficits, no tremors   Data Review:   Micro Results Recent Results (from the past 240 hour(s))  Resp  Panel by RT PCR (RSV, Flu A&B, Covid) - Nasopharyngeal Swab     Status: Abnormal   Collection Time: 01/08/20  8:30 PM   Specimen: Nasopharyngeal Swab  Result Value Ref Range Status   SARS Coronavirus 2 by RT PCR POSITIVE (A) NEGATIVE Final    Comment: RESULT CALLED TO, READ BACK BY AND VERIFIED WITH: T WALKER,RN@2335  01/08/20 MKELLY (NOTE) SARS-CoV-2 target nucleic acids are DETECTED.  SARS-CoV-2 RNA is generally detectable in upper respiratory specimens  during the acute phase of infection. Positive results are indicative of the presence of the identified virus, but do not rule out bacterial infection or co-infection with other pathogens not detected by the test. Clinical correlation with patient history and other diagnostic information is necessary to determine patient infection status. The expected result is Negative.  Fact Sheet for Patients:  PinkCheek.be  Fact Sheet for Healthcare Providers: GravelBags.it  This test is not yet approved or cleared by the [place] FDA and  has been authorized for detection and/or diagnosis of SARS-CoV-2 by FDA under an Emergency Use Authorization (EUA).  This EUA will remain in effect (meaning this test can be [place] ed) for the duration of  the  COVID-19 declaration under Section 564(b)(1) of the Act, 21 U.S.C. section 360bbb-3(b)(1), unless the authorization is terminated or revoked sooner.      Influenza A by PCR NEGATIVE NEGATIVE Final   Influenza B by PCR NEGATIVE NEGATIVE Final    Comment: (NOTE) The Xpert Xpress SARS-CoV-2/FLU/RSV assay is intended as an aid in  the diagnosis of influenza from Nasopharyngeal swab specimens and  should not be used as a sole basis for treatment. Nasal washings and  aspirates are unacceptable for Xpert Xpress SARS-CoV-2/FLU/RSV  testing.  Fact Sheet for Patients: PinkCheek.be  Fact Sheet for Healthcare Providers: GravelBags.it  This test is not yet approved or cleared by the [place] FDA and  has been authorized for detection and/or diagnosis of SARS-CoV-2 by  FDA under an Emergency Use Authorization (EUA). This EUA will remain  in effect (meaning this test can be used) for the duration of the  Covid-19 declaration under Section 564(b)(1) of the Act, 21  U.S.C. section 360bbb-3(b)(1), unless the authorization is  terminated or revoked.    Respiratory Syncytial Virus by PCR NEGATIVE NEGATIVE Final    Comment: (NOTE) Fact Sheet for Patients: PinkCheek.be  Fact Sheet for Healthcare Providers: GravelBags.it  This test is not yet approved or cleared by the [place] FDA and  has been authorized for detection and/or diagnosis of SARS-CoV-2 by  FDA under an Emergency Use Authorization (EUA). This EUA will remain  in effect (meaning this test can be used) for the duration of the  COVID-19 declaration under Section 564(b)(1) of the Act, 21 U.S.C.  section 360bbb-3(b)(1), unless the authorization is terminated or  revoked. Performed at [facility], [address], [place], [place] [address]     Radiology Reports CT Head Wo Contrast  Result Date:  01/08/2020 CLINICAL DATA:  Mental status change.  Multiple recent falls. EXAM: CT HEAD WITHOUT CONTRAST CT CERVICAL SPINE WITHOUT CONTRAST TECHNIQUE: Multidetector CT imaging of the head and cervical spine was performed following the standard protocol without intravenous contrast. Multiplanar CT image reconstructions of the cervical spine were also generated. COMPARISON:  Head and cervical spine CTs 08/22/2012 FINDINGS: CT HEAD FINDINGS Brain: There is no evidence of an acute infarct, intracranial hemorrhage, mass, midline shift, or extra-axial fluid collection. Patchy hypodensities in the cerebral white matter bilaterally  may have mildly progressed and are nonspecific but compatible with severe chronic small vessel ischemic disease. Mild cerebral atrophy is not abnormal for age. Vascular: Calcified atherosclerosis at the skull base. No hyperdense vessel. Skull: No acute fracture or suspicious osseous lesion. Sinuses/Orbits: The visualized paranasal sinuses and mastoid air cells are clear. Limited assessment of the orbits due to motion. Other: None. CT CERVICAL SPINE FINDINGS Alignment: Cervical spine straightening.  No listhesis. Skull base and vertebrae: No acute fracture or suspicious osseous lesion. Soft tissues and spinal canal: No prevertebral fluid or swelling. No visible canal hematoma. Disc levels: Chronic mild-to-moderate disc space narrowing at C4-5 with disc bulging and uncovertebral spurring resulting in likely moderate spinal stenosis and moderate right and mild left neural foraminal stenosis. Upper chest: Partially visualized patchy peripheral ground-glass opacity in the right upper lobe. Other: Calcified atherosclerosis at the right greater than left carotid bifurcations. IMPRESSION: 1. No evidence of acute intracranial abnormality. 2. Severe chronic small vessel ischemic disease. 3. No evidence of acute fracture or traumatic subluxation in the cervical spine. 4. Partially visualized patchy  peripheral ground-glass opacity in the right upper lobe, likely infectious/inflammatory. Correlate with pending chest radiograph. Electronically Signed   By: Logan Bores M.D.   On: 01/08/2020 21:33   CT Cervical Spine Wo Contrast  Result Date: 01/08/2020 CLINICAL DATA:  Mental status change.  Multiple recent falls. EXAM: CT HEAD WITHOUT CONTRAST CT CERVICAL SPINE WITHOUT CONTRAST TECHNIQUE: Multidetector CT imaging of the head and cervical spine was performed following the standard protocol without intravenous contrast. Multiplanar CT image reconstructions of the cervical spine were also generated. COMPARISON:  Head and cervical spine CTs 08/22/2012 FINDINGS: CT HEAD FINDINGS Brain: There is no evidence of an acute infarct, intracranial hemorrhage, mass, midline shift, or extra-axial fluid collection. Patchy hypodensities in the cerebral white matter bilaterally may have mildly progressed and are nonspecific but compatible with severe chronic small vessel ischemic disease. Mild cerebral atrophy is not abnormal for age. Vascular: Calcified atherosclerosis at the skull base. No hyperdense vessel. Skull: No acute fracture or suspicious osseous lesion. Sinuses/Orbits: The visualized paranasal sinuses and mastoid air cells are clear. Limited assessment of the orbits due to motion. Other: None. CT CERVICAL SPINE FINDINGS Alignment: Cervical spine straightening.  No listhesis. Skull base and vertebrae: No acute fracture or suspicious osseous lesion. Soft tissues and spinal canal: No prevertebral fluid or swelling. No visible canal hematoma. Disc levels: Chronic mild-to-moderate disc space narrowing at C4-5 with disc bulging and uncovertebral spurring resulting in likely moderate spinal stenosis and moderate right and mild left neural foraminal stenosis. Upper chest: Partially visualized patchy peripheral ground-glass opacity in the right upper lobe. Other: Calcified atherosclerosis at the right greater than left  carotid bifurcations. IMPRESSION: 1. No evidence of acute intracranial abnormality. 2. Severe chronic small vessel ischemic disease. 3. No evidence of acute fracture or traumatic subluxation in the cervical spine. 4. Partially visualized patchy peripheral ground-glass opacity in the right upper lobe, likely infectious/inflammatory. Correlate with pending chest radiograph. Electronically Signed   By: Logan Bores M.D.   On: 01/08/2020 21:33   CT ABDOMEN PELVIS W CONTRAST  Result Date: 01/08/2020 CLINICAL DATA:  Abdominal distension, back pain EXAM: CT ABDOMEN AND PELVIS WITH CONTRAST TECHNIQUE: Multidetector CT imaging of the abdomen and pelvis was performed using the standard protocol following bolus administration of intravenous contrast. CONTRAST:  167mL OMNIPAQUE IOHEXOL 300 MG/ML  SOLN COMPARISON:  04/25/2010 FINDINGS: Lower chest: Bibasilar ground-glass pulmonary infiltrates are present, nonspecific. This may reflect  basilar atelectasis or pulmonary edema. Extensive coronary artery calcification. Cardiac size is mildly enlarged. No pericardial effusion. Hepatobiliary: No focal liver abnormality is seen. No gallstones, gallbladder wall thickening, or biliary dilatation. Pancreas: Unremarkable Spleen: Unremarkable Adrenals/Urinary Tract: Adrenal glands are unremarkable. Kidneys are normal, without renal calculi, focal lesion, or hydronephrosis. Bladder is unremarkable. Stomach/Bowel: The stomach, small bowel, and large bowel are unremarkable. Appendix normal. No free intraperitoneal gas or fluid. Vascular/Lymphatic: Moderate atherosclerotic calcification within the infrarenal abdominal aorta. There is thrombosis of the common and external iliac artery without significant distal reconstitution of the visualized lower extremity arterial outflow clearly identified on this examination. Superimposed moderate atherosclerotic calcification of the left lower extremity arterial outflow. No aortic aneurysm. No  pathologic adenopathy within the abdomen and pelvis. Reproductive: Prostate is unremarkable. Other: Rectum unremarkable Musculoskeletal: No acute bone abnormality IMPRESSION: Bibasilar pulmonary infiltrates, poorly evaluated on this examination, possibly representing pulmonary edema. Extensive coronary artery calcification. Peripheral vascular disease with thrombosis of the left lower extremity arterial inflow. No definite reconstitution of the visualized left lower extremity arterial outflow, though this may be related to bolus timing. Noninvasive left lower extremity arterial examination would be helpful in determining the degree of arteriovascular insufficiency. Aortic Atherosclerosis (ICD10-I70.0). Electronically Signed   By: Fidela Salisbury MD   On: 01/08/2020 22:52   DG Chest Portable 1 View  Result Date: 01/08/2020 CLINICAL DATA:  59 year old male with cough. EXAM: PORTABLE CHEST 1 VIEW COMPARISON:  Chest radiograph dated 08/22/2012. FINDINGS: Faint bilateral hazy interstitial densities primarily involving the mid to lower lung field, and left greater right, new since the prior radiograph and may represent reactive airway disease or atypical infection. Clinical correlation is recommended. No focal consolidation, pleural effusion or pneumothorax. The cardiac silhouette is within limits. No acute osseous pathology. IMPRESSION: Bilateral hazy interstitial densities may represent reactive airway disease or atypical infection. Electronically Signed   By: Anner Crete M.D.   On: 01/08/2020 21:34     CBC Recent Labs  Lab 01/08/20 2049 01/09/20 0335 01/10/20 0721  WBC 21.5* 17.2* 19.4*  HGB 11.0* 9.6* 9.5*  HCT 32.3* 28.3* 28.1*  PLT 661* 611* 525*  MCV 80.3 80.6 81.4  MCH 27.4 27.4 27.5  MCHC 34.1 33.9 33.8  RDW 13.2 13.4 13.5  LYMPHSABS 2.1  --   --   MONOABS 2.3*  --   --   EOSABS 0.4  --   --   BASOSABS 0.1  --   --     Chemistries  Recent Labs  Lab 01/08/20 2049 01/09/20 0335  01/10/20 0721 01/12/20 0729  NA 119* 126* 129* 130*  K 5.7* 4.8 4.6 3.9  CL 87* 96* 99 96*  CO2 19* 18* 21* 24  GLUCOSE 170* 139* 258* 503*  BUN 31* 20 11 12   CREATININE 0.86 0.67 0.57* 0.70  CALCIUM 8.9 8.2* 8.5* 8.7*  MG  --  2.5*  --   --   AST 330* 251* 116* 59*  ALT 89* 77* 66* 56*  ALKPHOS 79 69 85 106  BILITOT 1.3* 0.9 0.5 0.4   ------------------------------------------------------------------------------------------------------------------ No results for input(s): CHOL, HDL, LDLCALC, TRIG, CHOLHDL, LDLDIRECT in the last 72 hours.  Lab Results  Component Value Date   HGBA1C 9.8 (H) 01/08/2020   ------------------------------------------------------------------------------------------------------------------ No results for input(s): TSH, T4TOTAL, T3FREE, THYROIDAB in the last 72 hours.  Invalid input(s): FREET3 ------------------------------------------------------------------------------------------------------------------ No results for input(s): VITAMINB12, FOLATE, FERRITIN, TIBC, IRON, RETICCTPCT in the last 72 hours.  Coagulation profile Recent Labs  Lab  01/09/20 0335  INR 1.3*    No results for input(s): DDIMER in the last 72 hours.  Cardiac Enzymes No results for input(s): CKMB, TROPONINI, MYOGLOBIN in the last 168 hours.  Invalid input(s): CK ------------------------------------------------------------------------------------------------------------------ No results found for: BNP   Roxan Hockey M.D on 01/12/2020 at 3:37 PM  Go to [email or web address] - for contact info  [facility] - Office  [phone]

## 2020-01-13 LAB — COMPREHENSIVE METABOLIC PANEL
ALT: 62 U/L — ABNORMAL HIGH (ref 0–44)
AST: 84 U/L — ABNORMAL HIGH (ref 15–41)
Albumin: 2.7 g/dL — ABNORMAL LOW (ref 3.5–5.0)
Alkaline Phosphatase: 99 U/L (ref 38–126)
Anion gap: 12 (ref 5–15)
BUN: 14 mg/dL (ref 8–23)
CO2: 23 mmol/L (ref 22–32)
Calcium: 8.8 mg/dL — ABNORMAL LOW (ref 8.9–10.3)
Chloride: 94 mmol/L — ABNORMAL LOW (ref 98–111)
Creatinine, Ser: 0.68 mg/dL (ref 0.61–1.24)
GFR, Estimated: >60 mL/min (ref >60)
Glucose, Bld: 459 mg/dL — ABNORMAL HIGH (ref 70–99)
Potassium: 4.1 mmol/L (ref 3.5–5.1)
Sodium: 129 mmol/L — ABNORMAL LOW (ref 135–145)
Total Bilirubin: 0.4 mg/dL (ref 0.3–1.2)
Total Protein: 7.8 g/dL (ref 6.5–8.1)

## 2020-01-13 LAB — CK: Total CK: 8360 U/L — ABNORMAL HIGH (ref 49–397)

## 2020-01-13 LAB — GLUCOSE, CAPILLARY
Glucose-Capillary: 419 mg/dL — ABNORMAL HIGH (ref 70–99)
Glucose-Capillary: 346 mg/dL — ABNORMAL HIGH (ref 70–99)
Glucose-Capillary: 241 mg/dL — ABNORMAL HIGH (ref 70–99)
Glucose-Capillary: 286 mg/dL — ABNORMAL HIGH (ref 70–99)

## 2020-01-13 LAB — CBC
HCT: 30.4 % — ABNORMAL LOW (ref 39.0–52.0)
Hemoglobin: 9.8 g/dL — ABNORMAL LOW (ref 13.0–17.0)
MCH: 26.8 pg (ref 26.0–34.0)
MCHC: 32.2 g/dL (ref 30.0–36.0)
MCV: 83.3 fL (ref 80.0–100.0)
Platelets: 597 10*3/uL — ABNORMAL HIGH (ref 150–400)
RBC: 3.65 MIL/uL — ABNORMAL LOW (ref 4.22–5.81)
RDW: 14 % (ref 11.5–15.5)
WBC: 21.7 10*3/uL — ABNORMAL HIGH (ref 4.0–10.5)
nRBC: 0 % (ref 0.0–0.2)

## 2020-01-13 LAB — D-DIMER, QUANTITATIVE: D-Dimer, Quant: 1.7 ug/mL-FEU — ABNORMAL HIGH (ref 0.00–0.50)

## 2020-01-13 LAB — FERRITIN: Ferritin: 497 ng/mL — ABNORMAL HIGH (ref 24–336)

## 2020-01-13 LAB — C-REACTIVE PROTEIN: CRP: 9.5 mg/dL — ABNORMAL HIGH (ref <1.0)

## 2020-01-13 MED ORDER — INSULIN ASPART 100 UNIT/ML ~~LOC~~ SOLN
0.0000 [IU] | Freq: Three times a day (TID) | SUBCUTANEOUS | Status: DC
  Administered 2020-01-13: 7 [IU] via SUBCUTANEOUS
  Administered 2020-01-13: 15 [IU] via SUBCUTANEOUS
  Administered 2020-01-14: 4 [IU] via SUBCUTANEOUS
  Administered 2020-01-14: 11 [IU] via SUBCUTANEOUS
  Administered 2020-01-14: 7 [IU] via SUBCUTANEOUS
  Administered 2020-01-15: 11 [IU] via SUBCUTANEOUS
  Administered 2020-01-15: 20 [IU] via SUBCUTANEOUS
  Administered 2020-01-15: 11 [IU] via SUBCUTANEOUS
  Administered 2020-01-16: 4 [IU] via SUBCUTANEOUS
  Administered 2020-01-16: 11 [IU] via SUBCUTANEOUS
  Administered 2020-01-16: 15 [IU] via SUBCUTANEOUS
  Administered 2020-01-17: 3 [IU] via SUBCUTANEOUS
  Administered 2020-01-17: 11 [IU] via SUBCUTANEOUS

## 2020-01-13 MED ORDER — INSULIN ASPART 100 UNIT/ML ~~LOC~~ SOLN: 0.0000 [IU] | Freq: Every day | SUBCUTANEOUS | Status: DC

## 2020-01-13 MED ORDER — INSULIN GLARGINE 100 UNIT/ML ~~LOC~~ SOLN
22.0000 [IU] | Freq: Two times a day (BID) | SUBCUTANEOUS | Status: DC
  Administered 2020-01-13 – 2020-01-17 (×9): 22 [IU] via SUBCUTANEOUS
  Filled 2020-01-13 (×13): qty 0.22

## 2020-01-13 MED ORDER — INSULIN ASPART 100 UNIT/ML ~~LOC~~ SOLN
28.0000 [IU] | Freq: Once | SUBCUTANEOUS | Status: AC
  Administered 2020-01-13: 28 [IU] via SUBCUTANEOUS

## 2020-01-13 MED ORDER — INSULIN ASPART 100 UNIT/ML ~~LOC~~ SOLN
4.0000 [IU] | Freq: Three times a day (TID) | SUBCUTANEOUS | Status: DC
  Administered 2020-01-13 – 2020-01-15 (×8): 4 [IU] via SUBCUTANEOUS

## 2020-01-13 NOTE — Progress Notes (Signed)
Physical Therapy Treatment Patient Details Name: [name] MRN: [id number] DOB: 01-08-1961 Today's Date: 01/13/2020    History of Present Illness [name] is a 59 y.o. male with medical history significant for diabetes, GERD, gout, hyperlipidemia, hypertension, osteoarthritis and left BKA who presents to the emergency department  due to  altered mental status. Patient was unable to provide detailed history on why he came to the emergency department, he states that he came due to high blood glucose level. Patient also states that he fell a couple of times when he tried to transfer from his wheelchair to another chair. Most of the history was obtained from ED PA and ED medical chart. Per report, patient symptoms started about 2 weeks ago with a change in behavior, he lives alone and was able to take care of his ADLs including cooking his own food, but he has not been doing this recently. Patient's son checked on him today and found him naked on the floor, patient stated that he laid there all night per medical record (unusual behavior at baseline), he was taken to his PCP where he had a negative strep screen and pending labs and urinalysis. Patient was found on the floor 2 more times when checked upon by son today. His medications were found in unusual places in the house and is unknown if patient has been compliant with his medication (different from baseline where patient usually takes his medication). His urine was noted to be dark and foul-smelling today, he was then brought to the ED for further evaluation and management. Patient denies fever, chills, nausea, vomiting or abdominal pain. He denies history of tobacco or alcohol use.    PT Comments    Patient presents seated at bedside and agreeable for therapy.  Patient unable wear LLE BKA prosthetic leg due c/o severe pain due to swelling with any pressure to stump, was unable to lock prosthetic leg to stump liner. Patient  demonstrates good return for laterally scooting at bedside, had to use RW to stand pivot to chair and limited to a few shuffle steps on RLE due to poor standing balance and generalized weakness.  Patient tolerated sitting up in chair after therapy - RN notified.  Patient will benefit from continued physical therapy in hospital and recommended venue below to increase strength, balance, endurance for safe ADLs and gait.    Follow Up Recommendations  SNF     Equipment Recommendations  None recommended by PT    Recommendations for Other Services       Precautions / Restrictions Precautions Precautions: Fall Restrictions Weight Bearing Restrictions: No    Mobility  Bed Mobility   Bed Mobility: Supine to Sit;Sit to Supine     Supine to sit: Supervision     General bed mobility comments: Patient presents seated at bedside  Transfers Overall transfer level: Needs assistance Equipment used: Rolling walker (2 wheeled) Transfers: Sit to/from [facility] Sit to Stand: Min assist Stand pivot transfers: Min assist       General transfer comment: slow labored movement  Ambulation/Gait Ambulation/Gait assistance: Mod assist;Max assist Gait Distance (Feet): 3 Feet Assistive device: Rolling walker (2 wheeled) Gait Pattern/deviations: Decreased step length - right;Decreased step length - left;Decreased stance time - left;Decreased stride length;Shuffle Gait velocity: slow   General Gait Details: limited to 3-4 slow labored shuffling steps on RLE, unable to wear LLE BKA due to increased pain/swelling in stump   Stairs  Wheelchair Mobility    Modified Rankin (Stroke Patients Only)       Balance Overall balance assessment: Needs assistance Sitting-balance support: Feet supported;No upper extremity supported Sitting balance-Leahy Scale: Good Sitting balance - Comments: seated at EOB   Standing balance support: During functional  activity;Bilateral upper extremity supported Standing balance-Leahy Scale: Poor Standing balance comment: fair using RW and wearing LLE BKA prosthetic leg,  fair/poor without prosthetic leg                            Cognition Arousal/Alertness: Awake/alert Behavior During Therapy: WFL for tasks assessed/performed Overall Cognitive Status: Within Functional Limits for tasks assessed                                        Exercises General Exercises - Lower Extremity Ankle Circles/Pumps: Seated;AROM;Strengthening;Right;10 reps Long Arc Quad: Seated;AROM;Strengthening;Both;10 reps Hip Flexion/Marching: Seated;AROM;Strengthening;Right;10 reps    General Comments        Pertinent Vitals/Pain Pain Assessment: Faces Faces Pain Scale: Hurts even more Pain Location: LLE with pressure Pain Descriptors / Indicators: Discomfort;Grimacing;Guarding;Sore Pain Intervention(s): Limited activity within patient's tolerance;Monitored during session    Home Living                      Prior Function            PT Goals (current goals can now be found in the care plan section) Acute Rehab PT Goals Patient Stated Goal: return home after rehab PT Goal Formulation: With patient Time For Goal Achievement: 01/23/20 Potential to Achieve Goals: Good Progress towards PT goals: Progressing toward goals    Frequency    Min 3X/week      PT Plan Current plan remains appropriate    Co-evaluation PT/OT/SLP Co-Evaluation/Treatment: Yes Reason for Co-Treatment: Complexity of the patient's impairments (multi-system involvement);For patient/therapist safety PT goals addressed during session: Mobility/safety with mobility;Proper use of DME;Strengthening/ROM;Balance        AM-PAC PT "6 Clicks" Mobility   Outcome Measure  Help needed turning from your back to your side while in a flat bed without using bedrails?: None Help needed moving from lying on your  back to sitting on the side of a flat bed without using bedrails?: A Little Help needed moving to and from a bed to a chair (including a wheelchair)?: A Lot Help needed standing up from a chair using your arms (e.g., wheelchair or bedside chair)?: A Lot Help needed to walk in hospital room?: A Lot Help needed climbing 3-5 steps with a railing? : Total 6 Click Score: 14    End of Session Equipment Utilized During Treatment: Gait belt Activity Tolerance: Patient tolerated treatment well;Patient limited by fatigue;Patient limited by pain Patient left: in chair;with call bell/phone within reach;with chair alarm set Nurse Communication: Mobility status PT Visit Diagnosis: Unsteadiness on feet (R26.81);Other abnormalities of gait and mobility (R26.89);Muscle weakness (generalized) (M62.81);History of falling (Z91.81)     Time: 5784-6962 PT Time Calculation (min) (ACUTE ONLY): 31 min  Charges:  $Therapeutic Exercise: 8-22 mins $Therapeutic Activity: 8-22 mins                     3:59 PM, 01/13/20 Lonell Grandchild, MPT Physical Therapist with [facility] [phone] office [phone] mobile phone

## 2020-01-13 NOTE — Evaluation (Signed)
Occupational Therapy Evaluation Patient Details Name: [name] MRN: [id number] DOB: November 04, 1960 Today's Date: 01/13/2020    History of Present Illness [name] is a 59 y.o. male with medical history significant for diabetes, GERD, gout, hyperlipidemia, hypertension, osteoarthritis and left BKA who presents to the emergency department  due to  altered mental status. Patient was unable to provide detailed history on why he came to the emergency department, he states that he came due to high blood glucose level. Patient also states that he fell a couple of times when he tried to transfer from his wheelchair to another chair. Most of the history was obtained from ED PA and ED medical chart. Per report, patient symptoms started about 2 weeks ago with a change in behavior, he lives alone and was able to take care of his ADLs including cooking his own food, but he has not been doing this recently. Patient's son checked on him today and found him naked on the floor, patient stated that he laid there all night per medical record (unusual behavior at baseline), he was taken to his PCP where he had a negative strep screen and pending labs and urinalysis. Patient was found on the floor 2 more times when checked upon by son today. His medications were found in unusual places in the house and is unknown if patient has been compliant with his medication (different from baseline where patient usually takes his medication). His urine was noted to be dark and foul-smelling today, he was then brought to the ED for further evaluation and management. Patient denies fever, chills, nausea, vomiting or abdominal pain. He denies history of tobacco or alcohol use.   Clinical Impression   Pt seen during PT treatment and agreeable to participate in OT evaluation. Patient demonstrates inability to tolerate wearing his LLE prosthetic due to pain from LE swelling which in turn creating increased difficulty  completing basic ADL tasks. Patient is not required to do majority of self care activities from a seated position and requires increased time and VC for technique and safety. Patient will benefit from skilled OT services at a SNF prior to returning home as he lives alone. OT will follow him acutely.     Follow Up Recommendations  SNF    Equipment Recommendations  None recommended by OT       Precautions / Restrictions Precautions Precautions: Fall Restrictions Weight Bearing Restrictions: No         Balance Overall balance assessment: Needs assistance Sitting-balance support: Feet supported;No upper extremity supported Sitting balance-Leahy Scale: Good Sitting balance - Comments: seated at EOB   Standing balance support: During functional activity;Bilateral upper extremity supported Standing balance-Leahy Scale: Poor Standing balance comment: fair using RW and wearing LLE BKA prosthetic leg,  fair/poor without prosthetic leg       ADL either performed or assessed with clinical judgement   ADL Overall ADL's : Needs assistance/impaired     Grooming: Wash/dry face;Sitting;Set up;Wash/dry hands   Upper Body Bathing: Set up;Sitting   Lower Body Bathing: Sitting/lateral leans;Moderate assistance   Upper Body Dressing : Set up;Sitting   Lower Body Dressing: Moderate assistance;Sitting/lateral leans   Toilet Transfer: Minimal assistance;RW;Stand-pivot Toilet Transfer Details (indicate cue type and reason): simulated toilet transfer completed with PT from bed to recliner using RW. See PT treatment note for details.                 Vision Baseline Vision/History: No visual deficits Patient Visual Report: No change from baseline  Pertinent Vitals/Pain Pain Assessment: Faces Faces Pain Scale: Hurts even more Pain Location: LLE with pressure Pain Descriptors / Indicators: Discomfort;Grimacing;Guarding;Sore Pain Intervention(s): Monitored during  session;Limited activity within patient's tolerance;Repositioned     Hand Dominance Right   Extremity/Trunk Assessment Upper Extremity Assessment Upper Extremity Assessment: Overall WFL for tasks assessed   Lower Extremity Assessment Lower Extremity Assessment: Defer to PT evaluation       Communication Communication Communication: No difficulties   Cognition Arousal/Alertness: Awake/alert Behavior During Therapy: WFL for tasks assessed/performed Overall Cognitive Status: Within Functional Limits for tasks assessed                 Home Living Family/patient expects to be discharged to:: Skilled nursing facility Living Arrangements: Alone Available Help at Discharge: Family;Available PRN/intermittently Type of Home: Mobile home Home Access: Ramped entrance     Home Layout: One level     Bathroom Shower/Tub: Astronomer Accessibility: Yes How Accessible: Accessible via wheelchair Home Equipment: [place] - 2 wheels;Cane - single point;Bedside commode;Shower seat;Wheelchair - manual          Prior Functioning/Environment Level of Independence: Independent with assistive device(s)        Comments: Community ambulator using LLE BKA prosthetic leg, drives        OT Problem List: Pain;Impaired balance (sitting and/or standing);Decreased safety awareness      OT Treatment/Interventions: Self-care/ADL training;Therapeutic exercise;Therapeutic activities;Neuromuscular education;DME and/or AE instruction;Patient/family education;Manual therapy;Balance training;Modalities    OT Goals(Current goals can be found in the care plan section) Acute Rehab OT Goals Patient Stated Goal: return home after rehab OT Goal Formulation: With patient Time For Goal Achievement: 01/27/20 Potential to Achieve Goals: Good  OT Frequency: Min 2X/week           Co-evaluation PT/OT/SLP Co-Evaluation/Treatment: Yes Reason for Co-Treatment: To address functional/ADL  transfers;For patient/therapist safety PT goals addressed during session: Mobility/safety with mobility;Proper use of DME;Strengthening/ROM;Balance OT goals addressed during session: Strengthening/ROM;Proper use of Adaptive equipment and DME      AM-PAC OT "6 Clicks" Daily Activity     Outcome Measure Help from another person eating meals?: None Help from another person taking care of personal grooming?: A Little Help from another person toileting, which includes using toliet, bedpan, or urinal?: A Little Help from another person bathing (including washing, rinsing, drying)?: A Lot Help from another person to put on and taking off regular upper body clothing?: A Little Help from another person to put on and taking off regular lower body clothing?: A Lot 6 Click Score: 17   End of Session Equipment Utilized During Treatment: Gait belt;Rolling walker Nurse Communication: Mobility status  Activity Tolerance: Patient tolerated treatment well;Patient limited by pain (limited by pain when attempting to use prosthetic to transfer) Patient left: in chair;with call bell/phone within reach;with chair alarm set  OT Visit Diagnosis: Muscle weakness (generalized) (M62.81);History of falling (Z91.81)                Time: 2353-6144 OT Time Calculation (min): 25 min Charges:  OT General Charges $OT Visit: 1 Visit OT Evaluation $OT Eval Low Complexity: [place], OTR/L,CBIS  [phone]   Keerthi Hazell, Clarene Duke 01/13/2020, 4:15 PM

## 2020-01-13 NOTE — TOC Progression Note (Signed)
Transition of Care [facility]) - Progression Note    Patient Details  Name: [name] MRN: [id number] Date of Birth: July 22, 1960  Transition of Care [facility]) CM/SW Contact  Ihor Gully, LCSW Phone Number: 01/13/2020, 4:05 PM  Clinical Narrative:    [name] is unable to make bed offer currently. [facility] is accepting of patient's insurance for HHPT.   Expected Discharge Plan: [place] Barriers to Discharge: Continued Medical Work up, SNF Pending bed offer  Expected Discharge Plan and Services Expected Discharge Plan: [place] In-house Referral: Clinical Social Work Discharge Planning Services: NA Post Acute Care Choice: [place] Living arrangements for the past 2 months: [place]: PT [place]: [place] ([place]) Date [place]: 01/13/20 Time [place]: 1604 Representative spoke with at [place]: [place] ([place]) Interventions    Readmission Risk Interventions No flowsheet data found.

## 2020-01-13 NOTE — Progress Notes (Signed)
Inpatient Diabetes Program Recommendations  AACE/ADA: New Consensus Statement on Inpatient Glycemic Control (2015)  Target Ranges:  Prepandial:   less than 140 mg/dL      Peak postprandial:   less than 180 mg/dL (1-2 hours)      Critically ill patients:  140 - 180 mg/dL   Lab Results  Component Value Date   GLUCAP 419 (H) 01/13/2020   HGBA1C 9.8 (H) 01/08/2020    Review of Glycemic Control Results for [name], [name] (MRN [id number]) as of 01/13/2020 10:59  Ref. Range 01/12/2020 08:31 01/12/2020 11:11 01/12/2020 17:58 01/12/2020 21:53 01/13/2020 08:12  Glucose-Capillary Latest Ref Range: 70 - 99 mg/dL 554 (HH) 418 (H) 378 (H) 278 (H) 419 (H)   Diabetes history: DM 2 Outpatient Diabetes medications: Glipizide 10 mg Daily, Novolog 24-30 units tid, Lantus 100 units qhs, Metformin 500 mg bid Current orders for Inpatient glycemic control:  Lantus 22 units bid Novolog 0-20 units tid + hs Novolog 4 units tid meal coverage  Decadron 6 mg Q24 hours  Inpatient Diabetes Program Recommendations:    Pt received a total of 63 units of Novolog in the last 24 hours.  Pt takes more insulin at home. If pt remains on same dose of decadron consider:  -  Increase Lantus to 30 units bid -  Increase Novolog meal coverage 8 units tid meal coverage -  Add Tradjenta 5 mg Daily  (shown to reduce mortality in COVID pts article provided in COVID glycemic control order set)  Thanks,  Tama Headings RN, MSN, BC-ADM Inpatient Diabetes Coordinator Team Pager [phone] (8a-5p)

## 2020-01-13 NOTE — Progress Notes (Addendum)
Patient Demographics:    [name], is a 59 y.o. male, DOB - [birth year], JJO:[id number]  Admit date - 01/08/2020   Admitting Physician Nathan Hoit, DO  Outpatient Primary MD for the patient is Nathan Schwalbe, MD  LOS - 5   Chief Complaint  Patient presents with  . Altered Mental Status        Subjective:    [name] today has no fevers, no emesis,  No chest pain,    --No cough, no dyspnea, no hypoxia -Patient remains intermittently not cooperative -Patient has been noncompliant with diabetic/low sugar diet--he has been eating a lot of sweets and now blood sugars are elevated  Patient continues to pull out his IVs--- unable to give enough IV fluids, CKs are trending back up  -We will use a wrist restraints to prevent patient from pulling out IVs   Assessment  & Plan :    Principal Problem:   Rhabdomyolysis Active Problems:   Hyponatremia   Essential hypertension, benign   Thrombocytosis   Mixed hyperlipidemia   Altered mental status   Hyperkalemia   Transaminitis   Dehydration   COVID-19 virus infection   Leukocytosis   GERD (gastroesophageal reflux disease)   Recurrent falls   Obesity (BMI 30-39.9)   Prolonged QT interval   Hyperglycemia due to diabetes mellitus [facility])  Brief Summary 59 y.o. male with medical history significant for diabetes, GERD, gout, hyperlipidemia, hypertension, osteoarthritis and left BKA admitted on 01/08/2020 with acute metabolic encephalopathy and rhabdomyolysis after being found confused and disoriented at home, he was incidentally found to be COVID-19 positive  A/p 1)Covid Pneumonia--with elevated inflammatory markers, chest x-ray with Covid pneumonia Continue IV steroids ---No cough, no dyspnea, no hypoxia hold off on remdesivir COVID-19 Labs  Recent Labs    01/13/20 0830 01/13/20 0831  DDIMER  --  1.70*  FERRITIN 497*   --   CRP 9.5*  --     Lab Results  Component Value Date   SARSCOV2NAA POSITIVE (A) 01/08/2020   [place] NEGATIVE 11/25/2018     2)Rhabdomyolysis---due to fall and prolonged immobilization CK-- 36,340 >>>24,090>> 11,129>>7,131>>5,815>>8,360 -Hold Crestor CK  trending up again because patient pulled out his IVs and did not get any IV fluids --c/n to  Hydrate iv and orally and follow CKs  3)DM2-recent A1c 9.8 reflecting uncontrolled diabetes with hyperglycemia hold Metformin, hold glipizide  -Glycemic control is worse due to steroids for Covid pneumonia -Increase Lantus insulin further  to 22 units twice daily use Novolog/Humalog Sliding scale insulin with Accu-Cheks/Fingersticks as ordered  4)HTN--continue amlodipine and metoprolol   5)Hyponatremia due to dehydration--- sodium is up to 129 from 119, continue IV fluids --Patient pulled out his IVs so he has not gotten as much IV fluids as he should as  6) acute metabolic encephalopathy--- most likely due to dehydration in the setting of decreased oral intake due to fall and Covid infection and hyponatremia -Mentation significantly improved hydration -Patient still having occasional disorientation and confusion especially at night  7)Elevated LFTs-Crestor on hold, AST higher than ALT, patient denies significant alcohol use AST 330>>251>>116>>59>>84 ALT 89>>77>>66>>56>>62 T Bili-WNL  8)Generalized weakness and deconditioning--- PT eval appreciated recommends SNF rehab  --Patient and son are agreeable with SNF placement for rehab -  If unable to get SNF bed need to readdress with son possibility of patient going back home with home health agency   Disposition/Need for in-Hospital Stay- patient unable to be discharged at this time due to --severe elevation of CKs and hyponatremia with dehydration requiring IV fluids*  Status is: Inpatient  Remains inpatient appropriate because:severe elevation of CKs and hyponatremia with  dehydration requiring IV fluids*   Disposition: The patient is from: Home              Anticipated d/c is to: SNF              Anticipated d/c date is: 2 days              Patient currently is not medically stable to d/c. Barriers: Not Clinically Stable- severe elevation of CKs and hyponatremia with dehydration requiring IV fluids*  Code Status : full  Family Communication:   Patient is awake, more alert Discussed with pt's son by phone  Consults  :  na  DVT Prophylaxis  :  Lovenox    Lab Results  Component Value Date   PLT 597 (H) 01/13/2020    Inpatient Medications  Scheduled Meds: . amLODipine  5 mg Oral Daily  . vitamin C  500 mg Oral Daily  . dexamethasone (DECADRON) injection  6 mg Intravenous Q24H  . enoxaparin (LOVENOX) injection  40 mg Subcutaneous Q24H  . influenza vac split quadrivalent PF  0.5 mL Intramuscular Tomorrow-1000  . insulin aspart  0-20 Units Subcutaneous TID WC  . insulin aspart  0-5 Units Subcutaneous QHS  . insulin aspart  4 Units Subcutaneous TID WC  . insulin glargine  22 Units Subcutaneous BID  . metoprolol tartrate  25 mg Oral BID  . pantoprazole  40 mg Oral Daily  . pneumococcal 23 valent vaccine  0.5 mL Intramuscular Tomorrow-1000  . zinc sulfate  220 mg Oral Daily   Continuous Infusions: . sodium chloride 75 mL/hr at 01/12/20 1621   PRN Meds:.oxyCODONE    Anti-infectives (From admission, onward)   Start     Dose/Rate Route Frequency Ordered Stop   01/08/20 2300  cefTRIAXone (ROCEPHIN) 1 g in sodium chloride 0.9 % 100 mL IVPB        1 g 200 mL/hr over 30 Minutes Intravenous  Once 01/08/20 2257 01/08/20 2351   01/08/20 2300  azithromycin (ZITHROMAX) 500 mg in sodium chloride 0.9 % 250 mL IVPB        500 mg 250 mL/hr over 60 Minutes Intravenous  Once 01/08/20 2257 01/09/20 0054        Objective:   Vitals:   01/12/20 2155 01/13/20 0415 01/13/20 0806 01/13/20 1320  BP: 132/69 123/63 128/68 125/70  Pulse: 70 74 92 79  Resp: 16  18 20 18   Temp: 98.9 F (37.2 C) 99.1 F (37.3 C) 98.8 F (37.1 C) 98.7 F (37.1 C)  TempSrc: Oral Oral Oral Oral  SpO2: 98% 99% 97% 98%  Weight:      Height:        Wt Readings from Last 3 Encounters:  01/09/20 96.6 kg  11/10/19 113.9 kg  11/10/19 113.9 kg     Intake/Output Summary (Last 24 hours) at 01/13/2020 1956 Last data filed at 01/13/2020 1413 Gross per 24 hour  Intake 480 ml  Output 1550 ml  Net -1070 ml    Physical Exam  Gen:- Awake Alert, in no apparent distress  HEENT:- [place].AT, No sclera icterus Neck-Supple Neck,No JVD,.  Lungs-  CTAB , fair symmetrical air movement CV- S1, S2 normal, regular  Abd-  +ve B.Sounds, Abd Soft, No tenderness,    Extremity/Skin:- No  edema, pedal pulses present , Lt BKA Psych-affect is appropriate, oriented x3 most of the time, occasional episodes of disorientation and confusion especially in the evenings Neuro-generalized weakness, no new focal deficits, no tremors   Data Review:   Micro Results Recent Results (from the past 240 hour(s))  Resp Panel by RT PCR (RSV, Flu A&B, Covid) - Nasopharyngeal Swab     Status: Abnormal   Collection Time: 01/08/20  8:30 PM   Specimen: Nasopharyngeal Swab  Result Value Ref Range Status   SARS Coronavirus 2 by RT PCR POSITIVE (A) NEGATIVE Final    Comment: RESULT CALLED TO, READ BACK BY AND VERIFIED WITH: T WALKER,RN@2335  01/08/20 MKELLY (NOTE) SARS-CoV-2 target nucleic acids are DETECTED.  SARS-CoV-2 RNA is generally detectable in upper respiratory specimens  during the acute phase of infection. Positive results are indicative of the presence of the identified virus, but do not rule out bacterial infection or co-infection with other pathogens not detected by the test. Clinical correlation with patient history and other diagnostic information is necessary to determine patient infection status. The expected result is Negative.  Fact Sheet for Patients:    PinkCheek.be  Fact Sheet for Healthcare Providers: GravelBags.it  This test is not yet approved or cleared by the [place] FDA and  has been authorized for detection and/or diagnosis of SARS-CoV-2 by FDA under an Emergency Use Authorization (EUA).  This EUA will remain in effect (meaning this test can be [place] ed) for the duration of  the COVID-19 declaration under Section 564(b)(1) of the Act, 21 U.S.C. section 360bbb-3(b)(1), unless the authorization is terminated or revoked sooner.      Influenza A by PCR NEGATIVE NEGATIVE Final   Influenza B by PCR NEGATIVE NEGATIVE Final    Comment: (NOTE) The Xpert Xpress SARS-CoV-2/FLU/RSV assay is intended as an aid in  the diagnosis of influenza from Nasopharyngeal swab specimens and  should not be used as a sole basis for treatment. Nasal washings and  aspirates are unacceptable for Xpert Xpress SARS-CoV-2/FLU/RSV  testing.  Fact Sheet for Patients: PinkCheek.be  Fact Sheet for Healthcare Providers: GravelBags.it  This test is not yet approved or cleared by the [place] FDA and  has been authorized for detection and/or diagnosis of SARS-CoV-2 by  FDA under an Emergency Use Authorization (EUA). This EUA will remain  in effect (meaning this test can be used) for the duration of the  Covid-19 declaration under Section 564(b)(1) of the Act, 21  U.S.C. section 360bbb-3(b)(1), unless the authorization is  terminated or revoked.    Respiratory Syncytial Virus by PCR NEGATIVE NEGATIVE Final    Comment: (NOTE) Fact Sheet for Patients: PinkCheek.be  Fact Sheet for Healthcare Providers: GravelBags.it  This test is not yet approved or cleared by the [place] FDA and  has been authorized for detection and/or diagnosis of SARS-CoV-2 by  FDA under an  Emergency Use Authorization (EUA). This EUA will remain  in effect (meaning this test can be used) for the duration of the  COVID-19 declaration under Section 564(b)(1) of the Act, 21 U.S.C.  section 360bbb-3(b)(1), unless the authorization is terminated or  revoked. Performed at [facility], [address], [place], [place] [address]     Radiology Reports CT Head Wo Contrast  Result Date: 01/08/2020 CLINICAL DATA:  Mental status change.  Multiple  recent falls. EXAM: CT HEAD WITHOUT CONTRAST CT CERVICAL SPINE WITHOUT CONTRAST TECHNIQUE: Multidetector CT imaging of the head and cervical spine was performed following the standard protocol without intravenous contrast. Multiplanar CT image reconstructions of the cervical spine were also generated. COMPARISON:  Head and cervical spine CTs 08/22/2012 FINDINGS: CT HEAD FINDINGS Brain: There is no evidence of an acute infarct, intracranial hemorrhage, mass, midline shift, or extra-axial fluid collection. Patchy hypodensities in the cerebral white matter bilaterally may have mildly progressed and are nonspecific but compatible with severe chronic small vessel ischemic disease. Mild cerebral atrophy is not abnormal for age. Vascular: Calcified atherosclerosis at the skull base. No hyperdense vessel. Skull: No acute fracture or suspicious osseous lesion. Sinuses/Orbits: The visualized paranasal sinuses and mastoid air cells are clear. Limited assessment of the orbits due to motion. Other: None. CT CERVICAL SPINE FINDINGS Alignment: Cervical spine straightening.  No listhesis. Skull base and vertebrae: No acute fracture or suspicious osseous lesion. Soft tissues and spinal canal: No prevertebral fluid or swelling. No visible canal hematoma. Disc levels: Chronic mild-to-moderate disc space narrowing at C4-5 with disc bulging and uncovertebral spurring resulting in likely moderate spinal stenosis and moderate right and mild left neural foraminal stenosis. Upper  chest: Partially visualized patchy peripheral ground-glass opacity in the right upper lobe. Other: Calcified atherosclerosis at the right greater than left carotid bifurcations. IMPRESSION: 1. No evidence of acute intracranial abnormality. 2. Severe chronic small vessel ischemic disease. 3. No evidence of acute fracture or traumatic subluxation in the cervical spine. 4. Partially visualized patchy peripheral ground-glass opacity in the right upper lobe, likely infectious/inflammatory. Correlate with pending chest radiograph. Electronically Signed   By: Logan Bores M.D.   On: 01/08/2020 21:33   CT Cervical Spine Wo Contrast  Result Date: 01/08/2020 CLINICAL DATA:  Mental status change.  Multiple recent falls. EXAM: CT HEAD WITHOUT CONTRAST CT CERVICAL SPINE WITHOUT CONTRAST TECHNIQUE: Multidetector CT imaging of the head and cervical spine was performed following the standard protocol without intravenous contrast. Multiplanar CT image reconstructions of the cervical spine were also generated. COMPARISON:  Head and cervical spine CTs 08/22/2012 FINDINGS: CT HEAD FINDINGS Brain: There is no evidence of an acute infarct, intracranial hemorrhage, mass, midline shift, or extra-axial fluid collection. Patchy hypodensities in the cerebral white matter bilaterally may have mildly progressed and are nonspecific but compatible with severe chronic small vessel ischemic disease. Mild cerebral atrophy is not abnormal for age. Vascular: Calcified atherosclerosis at the skull base. No hyperdense vessel. Skull: No acute fracture or suspicious osseous lesion. Sinuses/Orbits: The visualized paranasal sinuses and mastoid air cells are clear. Limited assessment of the orbits due to motion. Other: None. CT CERVICAL SPINE FINDINGS Alignment: Cervical spine straightening.  No listhesis. Skull base and vertebrae: No acute fracture or suspicious osseous lesion. Soft tissues and spinal canal: No prevertebral fluid or swelling. No visible  canal hematoma. Disc levels: Chronic mild-to-moderate disc space narrowing at C4-5 with disc bulging and uncovertebral spurring resulting in likely moderate spinal stenosis and moderate right and mild left neural foraminal stenosis. Upper chest: Partially visualized patchy peripheral ground-glass opacity in the right upper lobe. Other: Calcified atherosclerosis at the right greater than left carotid bifurcations. IMPRESSION: 1. No evidence of acute intracranial abnormality. 2. Severe chronic small vessel ischemic disease. 3. No evidence of acute fracture or traumatic subluxation in the cervical spine. 4. Partially visualized patchy peripheral ground-glass opacity in the right upper lobe, likely infectious/inflammatory. Correlate with pending chest radiograph. Electronically Signed   By:  Logan Bores M.D.   On: 01/08/2020 21:33   CT ABDOMEN PELVIS W CONTRAST  Result Date: 01/08/2020 CLINICAL DATA:  Abdominal distension, back pain EXAM: CT ABDOMEN AND PELVIS WITH CONTRAST TECHNIQUE: Multidetector CT imaging of the abdomen and pelvis was performed using the standard protocol following bolus administration of intravenous contrast. CONTRAST:  161mL OMNIPAQUE IOHEXOL 300 MG/ML  SOLN COMPARISON:  04/25/2010 FINDINGS: Lower chest: Bibasilar ground-glass pulmonary infiltrates are present, nonspecific. This may reflect basilar atelectasis or pulmonary edema. Extensive coronary artery calcification. Cardiac size is mildly enlarged. No pericardial effusion. Hepatobiliary: No focal liver abnormality is seen. No gallstones, gallbladder wall thickening, or biliary dilatation. Pancreas: Unremarkable Spleen: Unremarkable Adrenals/Urinary Tract: Adrenal glands are unremarkable. Kidneys are normal, without renal calculi, focal lesion, or hydronephrosis. Bladder is unremarkable. Stomach/Bowel: The stomach, small bowel, and large bowel are unremarkable. Appendix normal. No free intraperitoneal gas or fluid. Vascular/Lymphatic:  Moderate atherosclerotic calcification within the infrarenal abdominal aorta. There is thrombosis of the common and external iliac artery without significant distal reconstitution of the visualized lower extremity arterial outflow clearly identified on this examination. Superimposed moderate atherosclerotic calcification of the left lower extremity arterial outflow. No aortic aneurysm. No pathologic adenopathy within the abdomen and pelvis. Reproductive: Prostate is unremarkable. Other: Rectum unremarkable Musculoskeletal: No acute bone abnormality IMPRESSION: Bibasilar pulmonary infiltrates, poorly evaluated on this examination, possibly representing pulmonary edema. Extensive coronary artery calcification. Peripheral vascular disease with thrombosis of the left lower extremity arterial inflow. No definite reconstitution of the visualized left lower extremity arterial outflow, though this may be related to bolus timing. Noninvasive left lower extremity arterial examination would be helpful in determining the degree of arteriovascular insufficiency. Aortic Atherosclerosis (ICD10-I70.0). Electronically Signed   By: Fidela Salisbury MD   On: 01/08/2020 22:52   DG Chest Portable 1 View  Result Date: 01/08/2020 CLINICAL DATA:  59 year old male with cough. EXAM: PORTABLE CHEST 1 VIEW COMPARISON:  Chest radiograph dated 08/22/2012. FINDINGS: Faint bilateral hazy interstitial densities primarily involving the mid to lower lung field, and left greater right, new since the prior radiograph and may represent reactive airway disease or atypical infection. Clinical correlation is recommended. No focal consolidation, pleural effusion or pneumothorax. The cardiac silhouette is within limits. No acute osseous pathology. IMPRESSION: Bilateral hazy interstitial densities may represent reactive airway disease or atypical infection. Electronically Signed   By: Anner Crete M.D.   On: 01/08/2020 21:34     CBC Recent Labs   Lab 01/08/20 2049 01/09/20 0335 01/10/20 0721 01/13/20 0831  WBC 21.5* 17.2* 19.4* 21.7*  HGB 11.0* 9.6* 9.5* 9.8*  HCT 32.3* 28.3* 28.1* 30.4*  PLT 661* 611* 525* 597*  MCV 80.3 80.6 81.4 83.3  MCH 27.4 27.4 27.5 26.8  MCHC 34.1 33.9 33.8 32.2  RDW 13.2 13.4 13.5 14.0  LYMPHSABS 2.1  --   --   --   MONOABS 2.3*  --   --   --   EOSABS 0.4  --   --   --   BASOSABS 0.1  --   --   --     Chemistries  Recent Labs  Lab 01/08/20 2049 01/09/20 0335 01/10/20 0721 01/12/20 0729 01/13/20 0831  NA 119* 126* 129* 130* 129*  K 5.7* 4.8 4.6 3.9 4.1  CL 87* 96* 99 96* 94*  CO2 19* 18* 21* 24 23  GLUCOSE 170* 139* 258* 503* 459*  BUN 31* 20 11 12 14   CREATININE 0.86 0.67 0.57* 0.70 0.68  CALCIUM 8.9 8.2* 8.5*  8.7* 8.8*  MG  --  2.5*  --   --   --   AST 330* 251* 116* 59* 84*  ALT 89* 77* 66* 56* 62*  ALKPHOS 79 69 85 106 99  BILITOT 1.3* 0.9 0.5 0.4 0.4   ------------------------------------------------------------------------------------------------------------------ No results for input(s): CHOL, HDL, LDLCALC, TRIG, CHOLHDL, LDLDIRECT in the last 72 hours.  Lab Results  Component Value Date   HGBA1C 9.8 (H) 01/08/2020   ------------------------------------------------------------------------------------------------------------------ No results for input(s): TSH, T4TOTAL, T3FREE, THYROIDAB in the last 72 hours.  Invalid input(s): FREET3 ------------------------------------------------------------------------------------------------------------------ Recent Labs    01/13/20 0830  FERRITIN 497*    Coagulation profile Recent Labs  Lab 01/09/20 0335  INR 1.3*    Recent Labs    01/13/20 0831  DDIMER 1.70*    Cardiac Enzymes No results for input(s): CKMB, TROPONINI, MYOGLOBIN in the last 168 hours.  Invalid input(s): CK ------------------------------------------------------------------------------------------------------------------ No results found for:  BNP   Roxan Hockey M.D on 01/13/2020 at 7:56 PM  Go to [email or web address] - for contact info  [facility] - Office  [phone]

## 2020-01-13 NOTE — Plan of Care (Signed)
°  Problem: Acute Rehab OT Goals (only OT should resolve) Goal: Pt. Will Perform Upper Body Bathing Flowsheets (Taken 01/13/2020 1617) Pt Will Perform Upper Body Bathing:  sitting  with modified independence Goal: Pt. Will Perform Lower Body Bathing Flowsheets (Taken 01/13/2020 1617) Pt Will Perform Lower Body Bathing:  with supervision  sitting/lateral leans Goal: Pt. Will Perform Upper Body Dressing Flowsheets (Taken 01/13/2020 1617) Pt Will Perform Upper Body Dressing:  with modified independence  sitting Goal: Pt. Will Perform Lower Body Dressing Flowsheets (Taken 01/13/2020 1617) Pt Will Perform Lower Body Dressing:  with supervision  sitting/lateral leans Goal: Pt. Will Transfer To Toilet Flowsheets (Taken 01/13/2020 1617) Pt Will Transfer to Toilet:  with supervision  bedside commode  stand pivot transfer Goal: Pt. Will Perform Toileting-Clothing Manipulation Flowsheets (Taken 01/13/2020 1617) Pt Will Perform Toileting - Clothing Manipulation and hygiene:  with supervision  sit to/from stand

## 2020-01-14 LAB — CK: Total CK: 6248 U/L — ABNORMAL HIGH (ref 49–397)

## 2020-01-14 LAB — CBC
HCT: 28.9 % — ABNORMAL LOW (ref 39.0–52.0)
Hemoglobin: 9.5 g/dL — ABNORMAL LOW (ref 13.0–17.0)
MCH: 27.2 pg (ref 26.0–34.0)
MCHC: 32.9 g/dL (ref 30.0–36.0)
MCV: 82.8 fL (ref 80.0–100.0)
Platelets: 494 10*3/uL — ABNORMAL HIGH (ref 150–400)
RBC: 3.49 MIL/uL — ABNORMAL LOW (ref 4.22–5.81)
RDW: 14.3 % (ref 11.5–15.5)
WBC: 21.3 10*3/uL — ABNORMAL HIGH (ref 4.0–10.5)
nRBC: 0 % (ref 0.0–0.2)

## 2020-01-14 LAB — BASIC METABOLIC PANEL
Anion gap: 10 (ref 5–15)
BUN: 11 mg/dL (ref 8–23)
CO2: 24 mmol/L (ref 22–32)
Calcium: 8.8 mg/dL — ABNORMAL LOW (ref 8.9–10.3)
Chloride: 97 mmol/L — ABNORMAL LOW (ref 98–111)
Creatinine, Ser: 0.55 mg/dL — ABNORMAL LOW (ref 0.61–1.24)
GFR, Estimated: >60 mL/min (ref >60)
Glucose, Bld: 167 mg/dL — ABNORMAL HIGH (ref 70–99)
Potassium: 4.1 mmol/L (ref 3.5–5.1)
Sodium: 131 mmol/L — ABNORMAL LOW (ref 135–145)

## 2020-01-14 LAB — GLUCOSE, CAPILLARY
Glucose-Capillary: 175 mg/dL — ABNORMAL HIGH (ref 70–99)
Glucose-Capillary: 247 mg/dL — ABNORMAL HIGH (ref 70–99)
Glucose-Capillary: 281 mg/dL — ABNORMAL HIGH (ref 70–99)
Glucose-Capillary: 230 mg/dL — ABNORMAL HIGH (ref 70–99)

## 2020-01-14 LAB — MAGNESIUM: Magnesium: 1.7 mg/dL (ref 1.7–2.4)

## 2020-01-14 MED ORDER — ASCORBIC ACID 500 MG PO TABS: 500.0000 mg | ORAL_TABLET | Freq: Every day | ORAL | 0 refills | Status: AC

## 2020-01-14 MED ORDER — ROSUVASTATIN CALCIUM 10 MG PO TABS: 10.0000 mg | ORAL_TABLET | Freq: Every day | ORAL | 1 refills | Status: DC

## 2020-01-14 MED ORDER — ZINC SULFATE 220 (50 ZN) MG PO CAPS: 220.0000 mg | ORAL_CAPSULE | Freq: Every day | ORAL | 0 refills | Status: AC

## 2020-01-14 MED ORDER — DEXAMETHASONE 4 MG PO TABS
6.0000 mg | ORAL_TABLET | Freq: Every day | ORAL | Status: DC
  Administered 2020-01-15 – 2020-01-17 (×3): 6 mg via ORAL
  Filled 2020-01-14 (×3): qty 2

## 2020-01-14 MED ORDER — DEXAMETHASONE 6 MG PO TABS: 6.0000 mg | ORAL_TABLET | Freq: Every day | ORAL | 0 refills | Status: AC

## 2020-01-14 NOTE — Plan of Care (Signed)

## 2020-01-14 NOTE — Discharge Instructions (Signed)
10 Things You Can Do to Manage Your COVID-19 Symptoms at Home If you have possible or confirmed COVID-19: 1. Stay home from work and school. And stay away from other public places. If you must go out, avoid using any kind of public transportation, ridesharing, or taxis. 2. Monitor your symptoms carefully. If your symptoms get worse, call your healthcare provider immediately. 3. Get rest and stay hydrated. 4. If you have a medical appointment, call the healthcare provider ahead of time and tell them that you have or may have COVID-19. 5. For medical emergencies, call 911 and notify the dispatch personnel that you have or may have COVID-19. 6. Cover your cough and sneezes with a tissue or use the inside of your elbow. 7. Wash your hands often with soap and water for at least 20 seconds or clean your hands with an alcohol-based hand sanitizer that contains at least 60% alcohol. 8. As much as possible, stay in a specific room and away from other people in your home. Also, you should use a separate bathroom, if available. If you need to be around other people in or outside of the home, wear a mask. 9. Avoid sharing personal items with other people in your household, like dishes, towels, and bedding. 10. Clean all surfaces that are touched often, like counters, tabletops, and doorknobs. Use household cleaning sprays or wipes according to the label instructions. [email or web address] 10/02/2018 This information is not intended to replace advice given to you by your health care provider. Make sure you discuss any questions you have with your health care provider. Document Revised: 03/06/2019 Document Reviewed: 03/06/2019 Elsevier Patient Education  [place].  COVID-19: How to Protect Yourself and Others Know how it spreads  There is currently no vaccine to prevent coronavirus disease 2019 (COVID-19).  The best way to prevent illness is to avoid being exposed to this virus.  The virus is  thought to spread mainly from person-to-person. ? Between people who are in close contact with one another (within about 6 feet). ? Through respiratory droplets produced when an infected person coughs, sneezes or talks. ? These droplets can land in the mouths or noses of people who are nearby or possibly be inhaled into the lungs. ? COVID-19 may be spread by people who are not showing symptoms. Everyone should Clean your hands often  Wash your hands often with soap and water for at least 20 seconds especially after you have been in a public place, or after blowing your nose, coughing, or sneezing.  If soap and water are not readily available, use a hand sanitizer that contains at least 60% alcohol. Cover all surfaces of your hands and rub them together until they feel dry.  Avoid touching your eyes, nose, and mouth with unwashed hands. Avoid close contact  Limit contact with others as much as possible.  Avoid close contact with people who are sick.  Put distance between yourself and other people. ? Remember that some people without symptoms may be able to spread virus. ? This is especially important for people who are at higher risk of getting very GainPain.com.cy Cover your mouth and nose with a mask when around others  You could spread COVID-19 to others even if you do not feel sick.  Everyone should wear a mask in public settings and when around people not living in their household, especially when social distancing is difficult to maintain. ? Masks should not be placed on young children under age 48, anyone who  has trouble breathing, or is unconscious, incapacitated or otherwise unable to remove the mask without assistance.  The mask is meant to protect other people in case you are infected.  Do NOT use a facemask meant for a Dietitian.  Continue to keep about 6 feet between yourself and others. The  mask is not a substitute for social distancing. Cover coughs and sneezes  Always cover your mouth and nose with a tissue when you cough or sneeze or use the inside of your elbow.  Throw used tissues in the trash.  Immediately wash your hands with soap and water for at least 20 seconds. If soap and water are not readily available, clean your hands with a hand sanitizer that contains at least 60% alcohol. Clean and disinfect  Clean AND disinfect frequently touched surfaces daily. This includes tables, doorknobs, light switches, countertops, handles, desks, phones, keyboards, toilets, faucets, and sinks. [email or web address]  If surfaces are dirty, clean them: Use detergent or soap and water prior to disinfection.  Then, use a household disinfectant. You can see a list of EPA-registered household disinfectants here. [email or web address] 12/04/2018 This information is not intended to replace advice given to you by your health care provider. Make sure you discuss any questions you have with your health care provider. Document Revised: 12/12/2018 Document Reviewed: 10/10/2018 Elsevier Patient Education  [place]: Quarantine vs. Isolation QUARANTINE keeps someone who was in close contact with someone who has COVID-19 away from others. If you had close contact with a person who has COVID-19  Stay home until 14 days after your last contact.  Check your temperature twice a day and watch for symptoms of COVID-19.  If possible, stay away from people who are at higher-risk for getting very sick from COVID-19. ISOLATION keeps someone who is sick or tested positive for COVID-19 without symptoms away from others, even in their own home. If you are sick and think or know you have COVID-19  Stay home until after ? At least 10 days since symptoms first appeared and ? At least 24 hours with no fever without  fever-reducing medication and ? Symptoms have improved If you tested positive for COVID-19 but do not have symptoms  Stay home until after ? 10 days have passed since your positive test If you live with others, stay in a specific "sick room" or area and away from other people or animals, including pets. Use a separate bathroom, if available. [email or web address] 10/21/2018 This information is not intended to replace advice given to you by your health care provider. Make sure you discuss any questions you have with your health care provider. Document Revised: 03/06/2019 Document Reviewed: 03/06/2019 Elsevier Patient Education  [place].   COVID-19 Frequently Asked Questions COVID-19 (coronavirus disease) is an infection that is caused by a large family of viruses. Some viruses cause illness in people and others cause illness in animals like camels, cats, and bats. In some cases, the viruses that cause illness in animals can spread to humans. Where did the coronavirus come from? In December 2019, [place] told the [facility]) of several cases of lung disease (human respiratory illness). These cases were linked to an open seafood and livestock market in the city of [place]. The link to the seafood and livestock market suggests that the virus may have spread from animals to humans. However, since that first outbreak in December, the virus has also been shown to spread from person to person. What is the  name of the disease and the virus? Disease name Early on, this disease was called novel coronavirus. This is because scientists determined that the disease was caused by a new (novel) respiratory virus. The World Health Organization [facility]) has now named the disease COVID-19, or coronavirus disease. Virus name The virus that causes the disease is called severe acute respiratory syndrome coronavirus 2 (SARS-CoV-2). More information on disease and virus naming World Health  Organization [facility]): www.who.int/emergencies/diseases/novel-coronavirus-2019/technical-guidance/naming-the-coronavirus-disease-(covid-2019)-and-the-virus-that-causes-it Who is at risk for complications from coronavirus disease? Some people may be at higher risk for complications from coronavirus disease. This includes older adults and people who have chronic diseases, such as heart disease, diabetes, and lung disease. If you are at higher risk for complications, take these extra precautions:  Stay home as much as possible.  Avoid social gatherings and travel.  Avoid close contact with others. Stay at least 6 ft (2 m) away from others, if possible.  Wash your hands often with soap and water for at least 20 seconds.  Avoid touching your face, mouth, nose, or eyes.  Keep supplies on hand at home, such as food, medicine, and cleaning supplies.  If you must go out in public, wear a cloth face covering or face mask. Make sure your mask covers your nose and mouth. How does coronavirus disease spread? The virus that causes coronavirus disease spreads easily from person to person (is contagious). You may catch the virus by:  Breathing in droplets from an infected person. Droplets can be spread by a person breathing, speaking, singing, coughing, or sneezing.  Touching something, like a table or a doorknob, that was exposed to the virus (contaminated) and then touching your mouth, nose, or eyes. Can I get the virus from touching surfaces or objects? There is still a lot that we do not know about the virus that causes coronavirus disease. Scientists are basing a lot of information on what they know about similar viruses, such as:  Viruses cannot generally survive on surfaces for long. They need a human body (host) to survive.  It is more likely that the virus is spread by close contact with people who are sick (direct contact), such as through: ? Shaking hands or hugging. ? Breathing in respiratory  droplets that travel through the air. Droplets can be spread by a person breathing, speaking, singing, coughing, or sneezing.  It is less likely that the virus is spread when a person touches a surface or object that has the virus on it (indirect contact). The virus may be able to enter the body if the person touches a surface or object and then touches his or her face, eyes, nose, or mouth. Can a person spread the virus without having symptoms of the disease? It may be possible for the virus to spread before a person has symptoms of the disease, but this is most likely not the main way the virus is spreading. It is more likely for the virus to spread by being in close contact with people who are sick and breathing in the respiratory droplets spread by a person breathing, speaking, singing, coughing, or sneezing. What are the symptoms of coronavirus disease? Symptoms vary from person to person and can range from mild to severe. Symptoms may include:  Fever or chills.  Cough.  Difficulty breathing or feeling short of breath.  Headaches, body aches, or muscle aches.  Runny or stuffy (congested) nose.  Sore throat.  New loss of taste or smell.  Nausea, vomiting, or diarrhea. These  symptoms can appear anywhere from 2 to 14 days after you have been exposed to the virus. Some people may not have any symptoms. If you develop symptoms, call your health care provider. People with severe symptoms may need hospital care. Should I be tested for this virus? Your health care provider will decide whether to test you based on your symptoms, history of exposure, and your risk factors. How does a health care provider test for this virus? Health care providers will collect samples to send for testing. Samples may include:  Taking a swab of fluid from the back of your nose and throat, your nose, or your throat.  Taking fluid from the lungs by having you cough up mucus (sputum) into a sterile  cup.  Taking a blood sample. Is there a treatment or vaccine for this virus? Currently, there is no vaccine to prevent coronavirus disease. Also, there are no medicines like antibiotics or antivirals to treat the virus. A person who becomes sick is given supportive care, which means rest and fluids. A person may also relieve his or her symptoms by using over-the-counter medicines that treat sneezing, coughing, and runny nose. These are the same medicines that a person takes for the common cold. If you develop symptoms, call your health care provider. People with severe symptoms may need hospital care. What can I do to protect myself and my family from this virus?     You can protect yourself and your family by taking the same actions that you would take to prevent the spread of other viruses. Take the following actions:  Wash your hands often with soap and water for at least 20 seconds. If soap and water are not available, use alcohol-based hand sanitizer.  Avoid touching your face, mouth, nose, or eyes.  Cough or sneeze into a tissue, sleeve, or elbow. Do not cough or sneeze into your hand or the air. ? If you cough or sneeze into a tissue, throw it away immediately and wash your hands.  Disinfect objects and surfaces that you frequently touch every day.  Stay away from people who are sick.  Avoid going out in public, follow guidance from your state and local health authorities.  Avoid crowded indoor spaces. Stay at least 6 ft (2 m) away from others.  If you must go out in public, wear a cloth face covering or face mask. Make sure your mask covers your nose and mouth.  Stay home if you are sick, except to get medical care. Call your health care provider before you get medical care. Your health care provider will tell you how long to stay home.  Make sure your vaccines are up to date. Ask your health care provider what vaccines you need. What should I do if I need to travel? Follow  travel recommendations from your local health authority, the CDC, and WHO. Travel information and advice  Centers for Disease Control and Prevention (CDC): BodyEditor.hu  World Health Organization [facility]): ThirdIncome.ca Know the risks and take action to protect your health  You are at higher risk of getting coronavirus disease if you are traveling to areas with an outbreak or if you are exposed to travelers from areas with an outbreak.  Wash your hands often and practice good hygiene to lower the risk of catching or spreading the virus. What should I do if I am sick? General instructions to stop the spread of infection  Wash your hands often with soap and water for at least  20 seconds. If soap and water are not available, use alcohol-based hand sanitizer.  Cough or sneeze into a tissue, sleeve, or elbow. Do not cough or sneeze into your hand or the air.  If you cough or sneeze into a tissue, throw it away immediately and wash your hands.  Stay home unless you must get medical care. Call your health care provider or local health authority before you get medical care.  Avoid public areas. Do not take public transportation, if possible.  If you can, wear a mask if you must go out of the house or if you are in close contact with someone who is not sick. Make sure your mask covers your nose and mouth. Keep your home clean  Disinfect objects and surfaces that are frequently touched every day. This may include: ? Counters and tables. ? Doorknobs and light switches. ? Sinks and faucets. ? Electronics such as phones, remote controls, keyboards, computers, and tablets.  Wash dishes in hot, soapy water or use a dishwasher. Air-dry your dishes.  Wash laundry in hot water. Prevent infecting other household members  Let healthy household members care for children and pets, if possible. If you have  to care for children or pets, wash your hands often and wear a mask.  Sleep in a different bedroom or bed, if possible.  Do not share personal items, such as razors, toothbrushes, deodorant, combs, brushes, towels, and washcloths. Where to find more information Centers for Disease Control and Prevention (CDC)  Information and news updates: https://www.butler-gonzalez.com/ World Health Organization [facility])  Information and news updates: MissExecutive.com.ee  Coronavirus health topic: https://www.castaneda.info/  Questions and answers on COVID-19: OpportunityDebt.at  Global tracker: who.sprinklr.com American Academy of Pediatrics (AAP)  Information for families: www.healthychildren.org/English/health-issues/conditions/chest-lungs/Pages/2019-Novel-Coronavirus.aspx The coronavirus situation is changing rapidly. Check your local health authority website or the CDC and [facility] websites for updates and news. When should I contact a health care provider?  Contact your health care provider if you have symptoms of an infection, such as fever or cough, and you: ? Have been near anyone who is known to have coronavirus disease. ? Have come into contact with a person who is suspected to have coronavirus disease. ? Have traveled to an area where there is an outbreak of COVID-19. When should I get emergency medical care?  Get help right away by calling your local emergency services (911 in the [place] if you have: ? Trouble breathing. ? Pain or pressure in your chest. ? Confusion. ? Blue-tinged lips and fingernails. ? Difficulty waking from sleep. ? Symptoms that get worse. Let the emergency medical personnel know if you think you have coronavirus disease. Summary  A new respiratory virus is spreading from person to person and causing COVID-19 (coronavirus disease).  The virus that causes COVID-19 appears to spread  easily. It spreads from one person to another through droplets from breathing, speaking, singing, coughing, or sneezing.  Older adults and those with chronic diseases are at higher risk of disease. If you are at higher risk for complications, take extra precautions.  There is currently no vaccine to prevent coronavirus disease. There are no medicines, such as antibiotics or antivirals, to treat the virus.  You can protect yourself and your family by washing your hands often, avoiding touching your face, and covering your coughs and sneezes. This information is not intended to replace advice given to you by your health care provider. Make sure you discuss any questions you have with your health care provider. Document Revised:  01/17/2019 Document Reviewed: 07/16/2018 Elsevier Patient Education  [place] INFORMATION: PAY CLOSE ATTENTION   PHYSICIAN DISCHARGE INSTRUCTIONS  Follow with Primary care provider  Vidal Schwalbe, MD  and other consultants as instructed by your Hospitalist Physician  [place] IF SYMPTOMS COME BACK, WORSEN OR NEW PROBLEM DEVELOPS   Please note: You were cared for by a hospitalist during your hospital stay. Every effort will be made to forward records to your primary care provider.  You can request that your primary care provider send for your hospital records if they have not received them.  Once you are discharged, your primary care physician will handle any further medical issues. Please note that NO REFILLS for any discharge medications will be authorized once you are discharged, as it is imperative that you return to your primary care physician (or establish a relationship with a primary care physician if you do not have one) for your post hospital discharge needs so that they can reassess your need for medications and monitor your lab values.  Please get a complete blood count and chemistry panel checked  by your Primary MD at your next visit, and again as instructed by your Primary MD.  Get Medicines reviewed and adjusted: Please take all your medications with you for your next visit with your Primary MD  Laboratory/radiological data: Please request your Primary MD to go over all hospital tests and procedure/radiological results at the follow up, please ask your primary care provider to get all Hospital records sent to his/her office.  In some cases, they will be blood work, cultures and biopsy results pending at the time of your discharge. Please request that your primary care provider follow up on these results.  If you are diabetic, please bring your blood sugar readings with you to your follow up appointment with primary care.    Please call and make your follow up appointments as soon as possible.    Also Note the following: If you experience worsening of your admission symptoms, develop shortness of breath, life threatening emergency, suicidal or homicidal thoughts you must seek medical attention immediately by calling 911 or calling your MD immediately  if symptoms less severe.  You must read complete instructions/literature along with all the possible adverse reactions/side effects for all the Medicines you take and that have been prescribed to you. Take any new Medicines after you have completely understood and accpet all the possible adverse reactions/side effects.   Do not drive when taking Pain medications or sleeping medications (Benzodiazepines)  Do not take more than prescribed Pain, Sleep and Anxiety Medications. It is not advisable to combine anxiety,sleep and pain medications without talking with your primary care practitioner  Special Instructions: If you have smoked or chewed Tobacco  in the last 2 yrs please stop smoking, stop any regular Alcohol  and or any Recreational drug use.  Wear Seat belts while driving.  Do not drive if taking any narcotic, mind altering or  controlled substances or recreational drugs or alcohol.

## 2020-01-14 NOTE — Progress Notes (Signed)
Inpatient Diabetes Program Recommendations  AACE/ADA: New Consensus Statement on Inpatient Glycemic Control   Target Ranges:  Prepandial:   less than 140 mg/dL      Peak postprandial:   less than 180 mg/dL (1-2 hours)      Critically ill patients:  140 - 180 mg/dL  Results for [name], [name] (MRN [id number]) as of 01/14/2020 10:40  Ref. Range 01/13/2020 08:12 01/13/2020 11:28 01/13/2020 17:07 01/13/2020 20:50 01/14/2020 08:24  Glucose-Capillary Latest Ref Range: 70 - 99 mg/dL 419 (H) 346 (H) 241 (H) 286 (H) 175 (H)    Review of Glycemic Control  Diabetes history: DM2 Outpatient Diabetes medications: Lantus 100 units daily, Novolog 24-30 units TID with meals, Glipizide 10 mg daily, Metformin XR 500 mg BID Current orders for Inpatient glycemic control: Lantus 22 units BID, Novolog 0-20 units TID with meals, Novolog 0-5 units QHS, Novolog 4 units TID with meals; Decadron 6 mg Q24H  Inpatient Diabetes Program Recommendations:    Insulin: If steroids are continued as ordered, please consider increasing meal coverage to Novolog 12 units TID with meals if patient eats at least 50% of meals.  Thanks, Barnie Alderman, RN, MSN, CDE Diabetes Coordinator Inpatient Diabetes Program [phone] (Team Pager from 8am to 5pm)

## 2020-01-14 NOTE — TOC Progression Note (Signed)
Transition of Care [facility]) - Progression Note    Patient Details  Name: [name] MRN: [id number] Date of Birth: 03-Jan-1961  [facility]) CM/SW Contact  Salome Arnt, [place] Phone Number: 01/14/2020, 3:51 PM  Clinical Narrative:  LCSW called pt earlier today to notify him of d/c. [name] states pt cannot return home because they have not had COVID and cannot help him at home because of this. LCSW explained difficulty in finding SNF due to COVID and Medicaid only. LCSW called pt and pt was confused- oriented to self and place only. This is not baseline per son. Discussed with MD and RN. Will continue SNF bed search. Pt in restraints today due to trying to pull out IV. MD to discontinue restraints as pt must be 24 hour restraint free prior to d/c to SNF. Updated [facility].      Expected Discharge Plan: [place] Barriers to Discharge: Continued Medical Work up, SNF Pending bed offer  Expected Discharge Plan and Services Expected Discharge Plan: [place] In-house Referral: Clinical Social Work Discharge Planning Services: NA Post Acute Care Choice: [place] Living arrangements for the past 2 months: [place] Expected Discharge Date: 01/14/20                         HH Arranged: PT [place] Agency: [place] ([place]) Date [place]: 01/13/20 Time [place]: 1604 Representative spoke with at [place]: [place] ([place]) Interventions    Readmission Risk Interventions No flowsheet data found.

## 2020-01-14 NOTE — Discharge Summary (Signed)
Physician Discharge Summary  Neiko Trivedi OEU:[id number] DOB: 08/18/1960 DOA: 01/08/2020  PCP: Vidal Schwalbe, MD  Admit date: 01/08/2020 Discharge date: 01/14/2020  Admitted From:  HOME  Disposition:  HOME   Recommendations for Outpatient Follow-up:  1. Follow up with PCP in 2 weeks  Home Health: PT   Discharge Condition: STABLE  CODE STATUS: FULL    Brief Hospitalization Summary: Please see all hospital notes, images, labs for full details of the hospitalization. ADMISSION HPI: Dannel Rafter is a 59 y.o. male with medical history significant for diabetes, GERD, gout, hyperlipidemia, hypertension, osteoarthritis and left BKA who presents to the emergency department  due to altered mental status. Patient was unable to provide detailed history on why he came to the emergency department, he states that he came due to high blood glucose level. Patient also states that he fell a couple of times when he tried to transfer from his wheelchair to another chair. Most of the history was obtained from ED PA and ED medical chart. Per report, patient symptoms started about 2 weeks ago with a change in behavior, he lives alone and was able to take care of his ADLs including cooking his own food, but he has not been doing this recently. Patient's son checked on him today and found him naked on the floor, patient stated that he laid there all night per medical record (unusual behavior at baseline), he was taken to his PCP where he had a negative strep screen and pending labs and urinalysis. Patient was found on the floor 2 more times when checked upon by son today. His medications were found in unusual places in the house and is unknown if patient has been compliant with his medication (different from baseline where patient usually takes his medication). His urine was noted to be dark and foul-smelling today, he was then brought to the ED for further evaluation and management. Patient denies fever,  chills, nausea, vomiting or abdominal pain. He denies history of tobacco or alcohol use.  ED Course:  In the emergency department, he was hemodynamically stable. Work-up in the ED showed leukocytosis, thrombocytosis, normocytic anemia, hyponatremia, hyperkalemia, elevated BUN, hyperglycemia, AST 330, ALT 89, total bilirubin 1.3. SARS coronavirus 2 was positive. CT abdomen and pelvis with contrast showed bibasilar pulmonary infiltrates suspected to be due to pulmonary edema. Chest x-ray showed bilateral hazy interstitial densities which may represent reactive airway disease or atypical infection. CT head and CT cervical spine without contrast showed no evidence of acute intracranial abnormality or any evidence of acute fracture or traumatic subluxation in the cervical spine. He was empirically started on IV ceftriaxone and azithromycin, IV hydration was provided. Hospitalist was asked to admit patient for further evaluation and management.  Hospital Course  Brief Summary 59 y.o.malewith medical history significant fordiabetes, GERD, gout, hyperlipidemia, hypertension, osteoarthritisandleft BKA admitted on 01/08/2020 with acute metabolic encephalopathy and rhabdomyolysis after being found confused and disoriented at home, he was incidentally found to be COVID-19 positive  A/P 1)Covid Pneumonia--with elevated inflammatory markers, chest x-ray with Covid pneumonia Continue oral steroids ---No cough, no dyspnea, no hypoxia hold off on remdesivir COVID-19 Labs  Recent Labs (last 2 labs)       Recent Labs    01/13/20 0830 01/13/20 0831  DDIMER  --  1.70*  FERRITIN 497*  --   CRP 9.5*  --       Recent Labs       Lab Results  Component Value Date   SARSCOV2NAA POSITIVE (A)  01/08/2020   [place] NEGATIVE 11/25/2018     2)Rhabdomyolysis---due to fall and prolonged immobilization CK-- 36,340 >>>24,090>> 11,129>>7,131>>5,815>>8,360>6248 -Hold Crestor temporarily -CK  trending down now with hydration.    3)DM2-recent A1c 9.8 reflecting uncontrolled diabetes with hyperglycemia hold Metformin, hold glipizide  -Glycemic control is worse due to steroids for Covid pneumonia -Increase Lantus insulin further  to 22 units twice daily use Novolog/Humalog Sliding scale insulin with Accu-Cheks/Fingersticks as ordered  4)HTN--continue amlodipine and metoprolol   5)Hyponatremia due to dehydration--- sodium is up to 130 from 119, he was treated with IV fluids  6) acute metabolic encephalopathy--- RESOLVED NOW.  Most likely due to dehydration in the setting of decreased oral intake due to fall and Covid infection and hyponatremia -Mentation significantly improved hydration -Patient still having occasional disorientation and confusion especially at night thought secondary to sundowning  7)Elevated LFTs-Crestor on hold, AST higher than ALT, patient denies significant alcohol use AST 330>>251>>116>>59>>84 ALT 89>>77>>66>>56>>62 T Bili-WNL  8)Generalized weakness and deconditioning--- PT eval appreciated recommends SNF rehab  --Patient and son are agreeable with SNF placement for rehab.  Social worker told me that now the plan is for patient to go home with HHPT as they are unable to get SNF placement for patient.   -If unable to get SNF bed need to readdress with son possibility of patient going back home with home health agency   Discharge Diagnoses:  Principal Problem:   Rhabdomyolysis Active Problems:   Hyponatremia   Essential hypertension, benign   Thrombocytosis   Mixed hyperlipidemia   Altered mental status   Hyperkalemia   Transaminitis   Dehydration   COVID-19 virus infection   Leukocytosis   GERD (gastroesophageal reflux disease)   Recurrent falls   Obesity (BMI 30-39.9)   Prolonged QT interval   Hyperglycemia due to diabetes mellitus [facility])   Discharge Instructions:  Allergies as of 01/14/2020   No Known Allergies     Medication  List    STOP taking these medications   melatonin 3 MG Tabs tablet   multivitamin with minerals Tabs tablet     TAKE these medications   Accu-Chek Aviva Plus test strip Generic drug: glucose blood CHECK BLOOD SUGAR 4 TIMES DAILY.   acetaminophen 325 MG tablet Commonly known as: TYLENOL Take 2 tablets (650 mg total) by mouth every 6 (six) hours.   amitriptyline 25 MG tablet Commonly known as: ELAVIL Take 25 mg by mouth at bedtime.   amLODipine 10 MG tablet Commonly known as: NORVASC Take 1 tablet (10 mg total) by mouth daily.   ascorbic acid 500 MG tablet Commonly known as: VITAMIN C Take 1 tablet (500 mg total) by mouth daily. Start taking on: January 15, 2020   dexamethasone 6 MG tablet Commonly known as: Decadron Take 1 tablet (6 mg total) by mouth daily with breakfast for 3 days. Start taking on: January 15, 2020   glipiZIDE 10 MG 24 hr tablet Commonly known as: GLUCOTROL XL Take 1 tablet (10 mg total) by mouth daily.   Lantus SoloStar 100 UNIT/ML Solostar Pen Generic drug: insulin glargine qhs   metFORMIN 500 MG 24 hr tablet Commonly known as: GLUCOPHAGE-XR TAKE 1 TABLET BY MOUTH TWICE DAILY AFTER A MEAL What changed: See the new instructions.   metoprolol succinate 25 MG 24 hr tablet Commonly known as: TOPROL-XL Take 1 tablet (25 mg total) by mouth daily.   NovoLOG FlexPen 100 UNIT/ML FlexPen Generic drug: insulin aspart Inject 24-30 Units into the skin 3 (three) times  daily with meals.   pantoprazole 40 MG tablet Commonly known as: PROTONIX Take 1 tablet (40 mg total) by mouth daily.   polyethylene glycol 17 g packet Commonly known as: MIRALAX / GLYCOLAX Take 17 g by mouth daily. What changed:   when to take this  reasons to take this   rosuvastatin 10 MG tablet Commonly known as: CRESTOR Take 1 tablet (10 mg total) by mouth daily. Start taking on: January 21, 2020 What changed: These instructions start on January 21, 2020. If you are  unsure what to do until then, ask your doctor or other care provider.   UltiCare Mini Pen Needles 31G X 6 MM Misc Generic drug: Insulin Pen Needle USE AS DIRECTED   Vitamin D 125 MCG (5000 UT) Caps TAKE (1) CAPSULE BY MOUTH ONCE DAILY.   zinc sulfate 220 (50 Zn) MG capsule Take 1 capsule (220 mg total) by mouth daily. Start taking on: January 15, 2020       Follow-up Information    Vidal Schwalbe, MD. Schedule an appointment as soon as possible for a visit in 2 week(s).   Specialty: Family Medicine Contact information: [address] [place] [address] [phone]              No Known Allergies Allergies as of 01/14/2020   No Known Allergies     Medication List    STOP taking these medications   melatonin 3 MG Tabs tablet   multivitamin with minerals Tabs tablet     TAKE these medications   Accu-Chek Aviva Plus test strip Generic drug: glucose blood CHECK BLOOD SUGAR 4 TIMES DAILY.   acetaminophen 325 MG tablet Commonly known as: TYLENOL Take 2 tablets (650 mg total) by mouth every 6 (six) hours.   amitriptyline 25 MG tablet Commonly known as: ELAVIL Take 25 mg by mouth at bedtime.   amLODipine 10 MG tablet Commonly known as: NORVASC Take 1 tablet (10 mg total) by mouth daily.   ascorbic acid 500 MG tablet Commonly known as: VITAMIN C Take 1 tablet (500 mg total) by mouth daily. Start taking on: January 15, 2020   dexamethasone 6 MG tablet Commonly known as: Decadron Take 1 tablet (6 mg total) by mouth daily with breakfast for 3 days. Start taking on: January 15, 2020   glipiZIDE 10 MG 24 hr tablet Commonly known as: GLUCOTROL XL Take 1 tablet (10 mg total) by mouth daily.   Lantus SoloStar 100 UNIT/ML Solostar Pen Generic drug: insulin glargine qhs   metFORMIN 500 MG 24 hr tablet Commonly known as: GLUCOPHAGE-XR TAKE 1 TABLET BY MOUTH TWICE DAILY AFTER A MEAL What changed: See the new instructions.   metoprolol succinate 25 MG  24 hr tablet Commonly known as: TOPROL-XL Take 1 tablet (25 mg total) by mouth daily.   NovoLOG FlexPen 100 UNIT/ML FlexPen Generic drug: insulin aspart Inject 24-30 Units into the skin 3 (three) times daily with meals.   pantoprazole 40 MG tablet Commonly known as: PROTONIX Take 1 tablet (40 mg total) by mouth daily.   polyethylene glycol 17 g packet Commonly known as: MIRALAX / GLYCOLAX Take 17 g by mouth daily. What changed:   when to take this  reasons to take this   rosuvastatin 10 MG tablet Commonly known as: CRESTOR Take 1 tablet (10 mg total) by mouth daily. Start taking on: January 21, 2020 What changed: These instructions start on January 21, 2020. If you are unsure what to do until  then, ask your doctor or other care provider.   UltiCare Mini Pen Needles 31G X 6 MM Misc Generic drug: Insulin Pen Needle USE AS DIRECTED   Vitamin D 125 MCG (5000 UT) Caps TAKE (1) CAPSULE BY MOUTH ONCE DAILY.   zinc sulfate 220 (50 Zn) MG capsule Take 1 capsule (220 mg total) by mouth daily. Start taking on: January 15, 2020       Procedures/Studies: CT Head Wo Contrast  Result Date: 01/08/2020 CLINICAL DATA:  Mental status change.  Multiple recent falls. EXAM: CT HEAD WITHOUT CONTRAST CT CERVICAL SPINE WITHOUT CONTRAST TECHNIQUE: Multidetector CT imaging of the head and cervical spine was performed following the standard protocol without intravenous contrast. Multiplanar CT image reconstructions of the cervical spine were also generated. COMPARISON:  Head and cervical spine CTs 08/22/2012 FINDINGS: CT HEAD FINDINGS Brain: There is no evidence of an acute infarct, intracranial hemorrhage, mass, midline shift, or extra-axial fluid collection. Patchy hypodensities in the cerebral white matter bilaterally may have mildly progressed and are nonspecific but compatible with severe chronic small vessel ischemic disease. Mild cerebral atrophy is not abnormal for age. Vascular: Calcified  atherosclerosis at the skull base. No hyperdense vessel. Skull: No acute fracture or suspicious osseous lesion. Sinuses/Orbits: The visualized paranasal sinuses and mastoid air cells are clear. Limited assessment of the orbits due to motion. Other: None. CT CERVICAL SPINE FINDINGS Alignment: Cervical spine straightening.  No listhesis. Skull base and vertebrae: No acute fracture or suspicious osseous lesion. Soft tissues and spinal canal: No prevertebral fluid or swelling. No visible canal hematoma. Disc levels: Chronic mild-to-moderate disc space narrowing at C4-5 with disc bulging and uncovertebral spurring resulting in likely moderate spinal stenosis and moderate right and mild left neural foraminal stenosis. Upper chest: Partially visualized patchy peripheral ground-glass opacity in the right upper lobe. Other: Calcified atherosclerosis at the right greater than left carotid bifurcations. IMPRESSION: 1. No evidence of acute intracranial abnormality. 2. Severe chronic small vessel ischemic disease. 3. No evidence of acute fracture or traumatic subluxation in the cervical spine. 4. Partially visualized patchy peripheral ground-glass opacity in the right upper lobe, likely infectious/inflammatory. Correlate with pending chest radiograph. Electronically Signed   By: Logan Bores M.D.   On: 01/08/2020 21:33   CT Cervical Spine Wo Contrast  Result Date: 01/08/2020 CLINICAL DATA:  Mental status change.  Multiple recent falls. EXAM: CT HEAD WITHOUT CONTRAST CT CERVICAL SPINE WITHOUT CONTRAST TECHNIQUE: Multidetector CT imaging of the head and cervical spine was performed following the standard protocol without intravenous contrast. Multiplanar CT image reconstructions of the cervical spine were also generated. COMPARISON:  Head and cervical spine CTs 08/22/2012 FINDINGS: CT HEAD FINDINGS Brain: There is no evidence of an acute infarct, intracranial hemorrhage, mass, midline shift, or extra-axial fluid collection.  Patchy hypodensities in the cerebral white matter bilaterally may have mildly progressed and are nonspecific but compatible with severe chronic small vessel ischemic disease. Mild cerebral atrophy is not abnormal for age. Vascular: Calcified atherosclerosis at the skull base. No hyperdense vessel. Skull: No acute fracture or suspicious osseous lesion. Sinuses/Orbits: The visualized paranasal sinuses and mastoid air cells are clear. Limited assessment of the orbits due to motion. Other: None. CT CERVICAL SPINE FINDINGS Alignment: Cervical spine straightening.  No listhesis. Skull base and vertebrae: No acute fracture or suspicious osseous lesion. Soft tissues and spinal canal: No prevertebral fluid or swelling. No visible canal hematoma. Disc levels: Chronic mild-to-moderate disc space narrowing at C4-5 with disc bulging and uncovertebral spurring resulting  in likely moderate spinal stenosis and moderate right and mild left neural foraminal stenosis. Upper chest: Partially visualized patchy peripheral ground-glass opacity in the right upper lobe. Other: Calcified atherosclerosis at the right greater than left carotid bifurcations. IMPRESSION: 1. No evidence of acute intracranial abnormality. 2. Severe chronic small vessel ischemic disease. 3. No evidence of acute fracture or traumatic subluxation in the cervical spine. 4. Partially visualized patchy peripheral ground-glass opacity in the right upper lobe, likely infectious/inflammatory. Correlate with pending chest radiograph. Electronically Signed   By: Logan Bores M.D.   On: 01/08/2020 21:33   CT ABDOMEN PELVIS W CONTRAST  Result Date: 01/08/2020 CLINICAL DATA:  Abdominal distension, back pain EXAM: CT ABDOMEN AND PELVIS WITH CONTRAST TECHNIQUE: Multidetector CT imaging of the abdomen and pelvis was performed using the standard protocol following bolus administration of intravenous contrast. CONTRAST:  157mL OMNIPAQUE IOHEXOL 300 MG/ML  SOLN COMPARISON:   04/25/2010 FINDINGS: Lower chest: Bibasilar ground-glass pulmonary infiltrates are present, nonspecific. This may reflect basilar atelectasis or pulmonary edema. Extensive coronary artery calcification. Cardiac size is mildly enlarged. No pericardial effusion. Hepatobiliary: No focal liver abnormality is seen. No gallstones, gallbladder wall thickening, or biliary dilatation. Pancreas: Unremarkable Spleen: Unremarkable Adrenals/Urinary Tract: Adrenal glands are unremarkable. Kidneys are normal, without renal calculi, focal lesion, or hydronephrosis. Bladder is unremarkable. Stomach/Bowel: The stomach, small bowel, and large bowel are unremarkable. Appendix normal. No free intraperitoneal gas or fluid. Vascular/Lymphatic: Moderate atherosclerotic calcification within the infrarenal abdominal aorta. There is thrombosis of the common and external iliac artery without significant distal reconstitution of the visualized lower extremity arterial outflow clearly identified on this examination. Superimposed moderate atherosclerotic calcification of the left lower extremity arterial outflow. No aortic aneurysm. No pathologic adenopathy within the abdomen and pelvis. Reproductive: Prostate is unremarkable. Other: Rectum unremarkable Musculoskeletal: No acute bone abnormality IMPRESSION: Bibasilar pulmonary infiltrates, poorly evaluated on this examination, possibly representing pulmonary edema. Extensive coronary artery calcification. Peripheral vascular disease with thrombosis of the left lower extremity arterial inflow. No definite reconstitution of the visualized left lower extremity arterial outflow, though this may be related to bolus timing. Noninvasive left lower extremity arterial examination would be helpful in determining the degree of arteriovascular insufficiency. Aortic Atherosclerosis (ICD10-I70.0). Electronically Signed   By: Fidela Salisbury MD   On: 01/08/2020 22:52   DG Chest Portable 1 View  Result Date:  01/08/2020 CLINICAL DATA:  59 year old male with cough. EXAM: PORTABLE CHEST 1 VIEW COMPARISON:  Chest radiograph dated 08/22/2012. FINDINGS: Faint bilateral hazy interstitial densities primarily involving the mid to lower lung field, and left greater right, new since the prior radiograph and may represent reactive airway disease or atypical infection. Clinical correlation is recommended. No focal consolidation, pleural effusion or pneumothorax. The cardiac silhouette is within limits. No acute osseous pathology. IMPRESSION: Bilateral hazy interstitial densities may represent reactive airway disease or atypical infection. Electronically Signed   By: Anner Crete M.D.   On: 01/08/2020 21:34     Subjective: Pt reports no complaints today.  He wants to eat breakfast.  No SOB and no CP symptoms.  No cough or fever.  He is eating and drinking well with no problems.    Discharge Exam: Vitals:   01/14/20 0602 01/14/20 0800  BP: 127/79 128/81  Pulse: 89   Resp: 17 18  Temp: 97.9 F (36.6 C) 99.4 F (37.4 C)  SpO2: 100% 95%   Vitals:   01/13/20 2024 01/13/20 2055 01/14/20 0602 01/14/20 0800  BP:  137/78 127/79 128/81  Pulse:  95 89   Resp:  17 17 18   Temp:  98.6 F (37 C) 97.9 F (36.6 C) 99.4 F (37.4 C)  TempSrc:    Oral  SpO2: 93% 95% 100% 95%  Weight:      Height:       General: Pt is alert, awake, not in acute distress Cardiovascular: normal S1/S2 +, no rubs, no gallops Respiratory: CTA bilaterally, no wheezing, no rhonchi Abdominal: Soft, NT, ND, bowel sounds + Extremities: no edema, no cyanosis   The results of significant diagnostics from this hospitalization (including imaging, microbiology, ancillary and laboratory) are listed below for reference.     Microbiology: Recent Results (from the past 240 hour(s))  Resp Panel by RT PCR (RSV, Flu A&B, Covid) - Nasopharyngeal Swab     Status: Abnormal   Collection Time: 01/08/20  8:30 PM   Specimen: Nasopharyngeal Swab   Result Value Ref Range Status   SARS Coronavirus 2 by RT PCR POSITIVE (A) NEGATIVE Final    Comment: RESULT CALLED TO, READ BACK BY AND VERIFIED WITH: T WALKER,RN@[id number]  01/08/20 MKELLY (NOTE) SARS-CoV-2 target nucleic acids are DETECTED.  SARS-CoV-2 RNA is generally detectable in upper respiratory specimens  during the acute phase of infection. Positive results are indicative of the presence of the identified virus, but do not rule out bacterial infection or co-infection with other pathogens not detected by the test. Clinical correlation with patient history and other diagnostic information is necessary to determine patient infection status. The expected result is Negative.  Fact Sheet for Patients:  PinkCheek.be  Fact Sheet for Healthcare Providers: GravelBags.it  This test is not yet approved or cleared by the [place] FDA and  has been authorized for detection and/or diagnosis of SARS-CoV-2 by FDA under an Emergency Use Authorization (EUA).  This EUA will remain in effect (meaning this test can be [place] ed) for the duration of  the COVID-19 declaration under Section 564(b)(1) of the Act, 21 U.S.C. section 360bbb-3(b)(1), unless the authorization is terminated or revoked sooner.      Influenza A by PCR NEGATIVE NEGATIVE Final   Influenza B by PCR NEGATIVE NEGATIVE Final    Comment: (NOTE) The Xpert Xpress SARS-CoV-2/FLU/RSV assay is intended as an aid in  the diagnosis of influenza from Nasopharyngeal swab specimens and  should not be used as a sole basis for treatment. Nasal washings and  aspirates are unacceptable for Xpert Xpress SARS-CoV-2/FLU/RSV  testing.  Fact Sheet for Patients: PinkCheek.be  Fact Sheet for Healthcare Providers: GravelBags.it  This test is not yet approved or cleared by the [place] FDA and  has been authorized for  detection and/or diagnosis of SARS-CoV-2 by  FDA under an Emergency Use Authorization (EUA). This EUA will remain  in effect (meaning this test can be used) for the duration of the  Covid-19 declaration under Section 564(b)(1) of the Act, 21  U.S.C. section 360bbb-3(b)(1), unless the authorization is  terminated or revoked.    Respiratory Syncytial Virus by PCR NEGATIVE NEGATIVE Final    Comment: (NOTE) Fact Sheet for Patients: PinkCheek.be  Fact Sheet for Healthcare Providers: GravelBags.it  This test is not yet approved or cleared by the [place] FDA and  has been authorized for detection and/or diagnosis of SARS-CoV-2 by  FDA under an Emergency Use Authorization (EUA). This EUA will remain  in effect (meaning this test can be used) for the duration of the  COVID-19 declaration under Section 564(b)(1) of the Act, 21 U.S.C.  section 360bbb-3(b)(1), unless the authorization is terminated or  revoked. Performed at [facility], [address], [place], [place] [address]      Labs: BNP (last 3 results) No results for input(s): BNP in the last 8760 hours. Basic Metabolic Panel: Recent Labs  Lab 01/09/20 0335 01/10/20 0721 01/12/20 0729 01/13/20 0831 01/14/20 0831  NA 126* 129* 130* 129* 131*  K 4.8 4.6 3.9 4.1 4.1  CL 96* 99 96* 94* 97*  CO2 18* 21* 24 23 24   GLUCOSE 139* 258* 503* 459* 167*  BUN 20 11 12 14 11   CREATININE 0.67 0.57* 0.70 0.68 0.55*  CALCIUM 8.2* 8.5* 8.7* 8.8* 8.8*  MG 2.5*  --   --   --  1.7  PHOS 3.6  --   --   --   --    Liver Function Tests: Recent Labs  Lab 01/08/20 2049 01/09/20 0335 01/10/20 0721 01/12/20 0729 01/13/20 0831  AST 330* 251* 116* 59* 84*  ALT 89* 77* 66* 56* 62*  ALKPHOS 79 69 85 106 99  BILITOT 1.3* 0.9 0.5 0.4 0.4  PROT 9.0* 7.8 7.6 7.4 7.8  ALBUMIN 3.2* 2.8* 2.6* 2.4* 2.7*   No results for input(s): LIPASE, AMYLASE in the last 168 hours. No results for  input(s): AMMONIA in the last 168 hours. CBC: Recent Labs  Lab 01/08/20 2049 01/09/20 0335 01/10/20 0721 01/13/20 0831 01/14/20 0831  WBC 21.5* 17.2* 19.4* 21.7* 21.3*  NEUTROABS 16.5*  --   --   --   --   HGB 11.0* 9.6* 9.5* 9.8* 9.5*  HCT 32.3* 28.3* 28.1* 30.4* 28.9*  MCV 80.3 80.6 81.4 83.3 82.8  PLT 661* 611* 525* 597* 494*   Cardiac Enzymes: Recent Labs  Lab 01/10/20 0721 01/11/20 0715 01/12/20 0729 01/13/20 0831 01/14/20 0831  CKTOTAL 11,129* 7,131* 5,815* 8,360* 6,248*   BNP: Invalid input(s): POCBNP CBG: Recent Labs  Lab 01/13/20 0812 01/13/20 1128 01/13/20 1707 01/13/20 2050 01/14/20 0824  GLUCAP 419* 346* 241* 286* 175*   D-Dimer Recent Labs    01/13/20 0831  DDIMER 1.70*   Hgb A1c No results for input(s): HGBA1C in the last 72 hours. Lipid Profile No results for input(s): CHOL, HDL, LDLCALC, TRIG, CHOLHDL, LDLDIRECT in the last 72 hours. Thyroid function studies No results for input(s): TSH, T4TOTAL, T3FREE, THYROIDAB in the last 72 hours.  Invalid input(s): FREET3 Anemia work up Recent Labs    01/13/20 0830  FERRITIN 497*   Urinalysis    Component Value Date/Time   COLORURINE YELLOW 01/08/2020 2029   APPEARANCEUR CLEAR 01/08/2020 2029   APPEARANCEUR Clear 08/22/2012 2000   LABSPEC 1.015 01/08/2020 2029   LABSPEC 1.003 08/22/2012 2000   PHURINE 5.0 01/08/2020 2029   GLUCOSEU NEGATIVE 01/08/2020 2029   GLUCOSEU >=500 08/22/2012 2000   HGBUR LARGE (A) 01/08/2020 2029   BILIRUBINUR NEGATIVE 01/08/2020 2029   BILIRUBINUR Negative 08/22/2012 2000   KETONESUR 5 (A) 01/08/2020 2029   PROTEINUR 30 (A) 01/08/2020 2029   NITRITE NEGATIVE 01/08/2020 2029   LEUKOCYTESUR NEGATIVE 01/08/2020 2029   LEUKOCYTESUR Negative 08/22/2012 2000   Sepsis Labs Invalid input(s): PROCALCITONIN,  WBC,  LACTICIDVEN Microbiology Recent Results (from the past 240 hour(s))  Resp Panel by RT PCR (RSV, Flu A&B, Covid) - Nasopharyngeal Swab     Status:  Abnormal   Collection Time: 01/08/20  8:30 PM   Specimen: Nasopharyngeal Swab  Result Value Ref Range Status   SARS Coronavirus 2 by RT PCR POSITIVE (A) NEGATIVE Final  Comment: RESULT CALLED TO, READ BACK BY AND VERIFIED WITH: T WALKER,RN@2335  01/08/20 MKELLY (NOTE) SARS-CoV-2 target nucleic acids are DETECTED.  SARS-CoV-2 RNA is generally detectable in upper respiratory specimens  during the acute phase of infection. Positive results are indicative of the presence of the identified virus, but do not rule out bacterial infection or co-infection with other pathogens not detected by the test. Clinical correlation with patient history and other diagnostic information is necessary to determine patient infection status. The expected result is Negative.  Fact Sheet for Patients:  PinkCheek.be  Fact Sheet for Healthcare Providers: GravelBags.it  This test is not yet approved or cleared by the [place] FDA and  has been authorized for detection and/or diagnosis of SARS-CoV-2 by FDA under an Emergency Use Authorization (EUA).  This EUA will remain in effect (meaning this test can be [place] ed) for the duration of  the COVID-19 declaration under Section 564(b)(1) of the Act, 21 U.S.C. section 360bbb-3(b)(1), unless the authorization is terminated or revoked sooner.      Influenza A by PCR NEGATIVE NEGATIVE Final   Influenza B by PCR NEGATIVE NEGATIVE Final    Comment: (NOTE) The Xpert Xpress SARS-CoV-2/FLU/RSV assay is intended as an aid in  the diagnosis of influenza from Nasopharyngeal swab specimens and  should not be used as a sole basis for treatment. Nasal washings and  aspirates are unacceptable for Xpert Xpress SARS-CoV-2/FLU/RSV  testing.  Fact Sheet for Patients: PinkCheek.be  Fact Sheet for Healthcare Providers: GravelBags.it  This test is not yet  approved or cleared by the [place] FDA and  has been authorized for detection and/or diagnosis of SARS-CoV-2 by  FDA under an Emergency Use Authorization (EUA). This EUA will remain  in effect (meaning this test can be used) for the duration of the  Covid-19 declaration under Section 564(b)(1) of the Act, 21  U.S.C. section 360bbb-3(b)(1), unless the authorization is  terminated or revoked.    Respiratory Syncytial Virus by PCR NEGATIVE NEGATIVE Final    Comment: (NOTE) Fact Sheet for Patients: PinkCheek.be  Fact Sheet for Healthcare Providers: GravelBags.it  This test is not yet approved or cleared by the [place] FDA and  has been authorized for detection and/or diagnosis of SARS-CoV-2 by  FDA under an Emergency Use Authorization (EUA). This EUA will remain  in effect (meaning this test can be used) for the duration of the  COVID-19 declaration under Section 564(b)(1) of the Act, 21 U.S.C.  section 360bbb-3(b)(1), unless the authorization is terminated or  revoked. Performed at [facility], [address], [place], [place] [address]    Time coordinating discharge: 34 mins   SIGNED:  Irwin Brakeman, MD  [facility] 01/14/2020, 11:54 AM How to contact the [facility] Attending or Consulting provider [place] or covering provider during after hours [place], for this patient?  1. Check the care team in [facility] and look for a) attending/consulting TRH provider listed and b) the [facility] team listed 2. Log into [email or web address] and use [place]'s universal password to access. If you do not have the password, please contact the hospital operator. 3. Locate the [facility] provider you are looking for under [facility] and page to a number that you can be directly reached. 4. If you still have difficulty reaching the provider, please page the [facility] (Director on Call) for the Hospitalists listed on amion for assistance.

## 2020-01-15 LAB — COMPREHENSIVE METABOLIC PANEL
ALT: 51 U/L — ABNORMAL HIGH (ref 0–44)
AST: 60 U/L — ABNORMAL HIGH (ref 15–41)
Albumin: 2.5 g/dL — ABNORMAL LOW (ref 3.5–5.0)
Alkaline Phosphatase: 90 U/L (ref 38–126)
Anion gap: 10 (ref 5–15)
BUN: 14 mg/dL (ref 8–23)
CO2: 23 mmol/L (ref 22–32)
Calcium: 8.7 mg/dL — ABNORMAL LOW (ref 8.9–10.3)
Chloride: 95 mmol/L — ABNORMAL LOW (ref 98–111)
Creatinine, Ser: 0.6 mg/dL — ABNORMAL LOW (ref 0.61–1.24)
GFR, Estimated: >60 mL/min (ref >60)
Glucose, Bld: 339 mg/dL — ABNORMAL HIGH (ref 70–99)
Potassium: 4 mmol/L (ref 3.5–5.1)
Sodium: 128 mmol/L — ABNORMAL LOW (ref 135–145)
Total Bilirubin: 0.4 mg/dL (ref 0.3–1.2)
Total Protein: 7.5 g/dL (ref 6.5–8.1)

## 2020-01-15 LAB — CBC WITH DIFFERENTIAL/PLATELET
Abs Immature Granulocytes: 0.14 10*3/uL — ABNORMAL HIGH (ref 0.00–0.07)
Basophils Absolute: 0 10*3/uL (ref 0.0–0.1)
Basophils Relative: 0 %
Eosinophils Absolute: 0 10*3/uL (ref 0.0–0.5)
Eosinophils Relative: 0 %
HCT: 26.5 % — ABNORMAL LOW (ref 39.0–52.0)
Hemoglobin: 8.8 g/dL — ABNORMAL LOW (ref 13.0–17.0)
Immature Granulocytes: 1 %
Lymphocytes Relative: 19 %
Lymphs Abs: 3.2 10*3/uL (ref 0.7–4.0)
MCH: 27.8 pg (ref 26.0–34.0)
MCHC: 33.2 g/dL (ref 30.0–36.0)
MCV: 83.6 fL (ref 80.0–100.0)
Monocytes Absolute: 1.4 10*3/uL — ABNORMAL HIGH (ref 0.1–1.0)
Monocytes Relative: 8 %
Neutro Abs: 12.1 10*3/uL — ABNORMAL HIGH (ref 1.7–7.7)
Neutrophils Relative %: 72 %
Platelets: 422 10*3/uL — ABNORMAL HIGH (ref 150–400)
RBC: 3.17 MIL/uL — ABNORMAL LOW (ref 4.22–5.81)
RDW: 14.2 % (ref 11.5–15.5)
WBC: 16.9 10*3/uL — ABNORMAL HIGH (ref 4.0–10.5)
nRBC: 0 % (ref 0.0–0.2)

## 2020-01-15 LAB — GLUCOSE, CAPILLARY
Glucose-Capillary: 172 mg/dL — ABNORMAL HIGH (ref 70–99)
Glucose-Capillary: 372 mg/dL — ABNORMAL HIGH (ref 70–99)
Glucose-Capillary: 262 mg/dL — ABNORMAL HIGH (ref 70–99)
Glucose-Capillary: 279 mg/dL — ABNORMAL HIGH (ref 70–99)
Glucose-Capillary: 235 mg/dL — ABNORMAL HIGH (ref 70–99)

## 2020-01-15 LAB — CK: Total CK: 5401 U/L — ABNORMAL HIGH (ref 49–397)

## 2020-01-15 LAB — MAGNESIUM: Magnesium: 1.7 mg/dL (ref 1.7–2.4)

## 2020-01-15 NOTE — Progress Notes (Signed)
01/15/2020 1:47 PM  Pt was discharged 10/13.  Unfortunately he has no assistance at home and will need SNF placement.  He remains stable to discharge to SNF when bed available.  See DC summary from 10/13.    Murvin Natal MD

## 2020-01-15 NOTE — Progress Notes (Signed)
Physical Therapy Treatment Patient Details Name: [name] MRN: [id number] DOB: Jun 25, 1960 Today's Date: 01/15/2020    History of Present Illness [name] is a 59 y.o. male with medical history significant for diabetes, GERD, gout, hyperlipidemia, hypertension, osteoarthritis and left BKA who presents to the emergency department  due to  altered mental status. Patient was unable to provide detailed history on why he came to the emergency department, he states that he came due to high blood glucose level. Patient also states that he fell a couple of times when he tried to transfer from his wheelchair to another chair. Most of the history was obtained from ED PA and ED medical chart. Per report, patient symptoms started about 2 weeks ago with a change in behavior, he lives alone and was able to take care of his ADLs including cooking his own food, but he has not been doing this recently. Patient's son checked on him today and found him naked on the floor, patient stated that he laid there all night per medical record (unusual behavior at baseline), he was taken to his PCP where he had a negative strep screen and pending labs and urinalysis. Patient was found on the floor 2 more times when checked upon by son today. His medications were found in unusual places in the house and is unknown if patient has been compliant with his medication (different from baseline where patient usually takes his medication). His urine was noted to be dark and foul-smelling today, he was then brought to the ED for further evaluation and management. Patient denies fever, chills, nausea, vomiting or abdominal pain. He denies history of tobacco or alcohol use.    PT Comments    Patient was agreeable to participating in PT today. Patient able to perform lateral scoots and stand pivot transfer using both hands on chair armrests and trunk flexion to transfer bed to chair standing on right leg only. Patient  refused to don prothesis. Patient continues to be tender to palpation over distal residual leg. Patient performed seated therapeutic exercises. Patient would continue to benefit from physical therapy in current venue and recommended venue below to improve functional abilities.     Follow Up Recommendations  SNF     Equipment Recommendations  None recommended by PT    Recommendations for Other Services       Precautions / Restrictions Precautions Precautions: Fall    Mobility  Bed Mobility Overal bed mobility: Needs Assistance Bed Mobility: Supine to Sit     Supine to sit: Supervision     General bed mobility comments: increased time; supervision for safety  Transfers Overall transfer level: Needs assistance Equipment used: None Transfers: Sit to/from Stand;Lateral/Scoot Transfers Sit to Stand: Min guard (B UE use on bedrail and chair armrests.) Stand pivot transfers: Min guard (B UE use on bedrail and chair armrests. R LE only.)      Lateral/Scoot Transfers: Supervision General transfer comment: slow, labored  Ambulation/Gait  General Gait Details: refused to attempt   Stairs      Wheelchair Mobility    Modified Rankin (Stroke Patients Only)       Balance                  Cognition Arousal/Alertness: Awake/alert Behavior During Therapy: WFL for tasks assessed/performed Overall Cognitive Status: Within Functional Limits for tasks assessed      Exercises General Exercises - Lower Extremity Ankle Circles/Pumps: Seated;AROM;Strengthening;Right;10 reps Long Arc Quad: Seated;AROM;Strengthening;Both;10 reps Hip Flexion/Marching: Seated;AROM;Strengthening;10 reps;Both  General Comments        Pertinent Vitals/Pain Pain Assessment: Faces Faces Pain Scale: Hurts even more Pain Location: L BKA Pain Intervention(s): Limited activity within patient's tolerance;Monitored during session    Home Living                      Prior  Function            PT Goals (current goals can now be found in the care plan section) Acute Rehab PT Goals Patient Stated Goal: return home after rehab PT Goal Formulation: With patient Time For Goal Achievement: 01/23/20 Potential to Achieve Goals: Good Progress towards PT goals: Progressing toward goals    Frequency    Min 3X/week      PT Plan Current plan remains appropriate       AM-PAC PT "6 Clicks" Mobility   Outcome Measure  Help needed turning from your back to your side while in a flat bed without using bedrails?: None Help needed moving from lying on your back to sitting on the side of a flat bed without using bedrails?: A Little Help needed moving to and from a bed to a chair (including a wheelchair)?: A Lot Help needed standing up from a chair using your arms (e.g., wheelchair or bedside chair)?: A Lot Help needed to walk in hospital room?: A Lot Help needed climbing 3-5 steps with a railing? : Total 6 Click Score: 14    End of Session Equipment Utilized During Treatment: Gait belt Activity Tolerance: Patient tolerated treatment well;Patient limited by fatigue;Patient limited by pain Patient left: in chair;with call bell/phone within reach;with chair alarm set Nurse Communication: Mobility status PT Visit Diagnosis: Unsteadiness on feet (R26.81);Other abnormalities of gait and mobility (R26.89);Muscle weakness (generalized) (M62.81);History of falling (Z91.81)     Time: 4536-4680 PT Time Calculation (min) (ACUTE ONLY): 23 min  Charges:  $Therapeutic Exercise: 8-22 mins $Therapeutic Activity: 8-22 mins                     Floria Raveling. Hartnett-Rands, MS, PT Per [place] [phone] 01/15/2020, 2:42 PM

## 2020-01-16 LAB — CBC WITH DIFFERENTIAL/PLATELET
Abs Immature Granulocytes: 0.17 10*3/uL — ABNORMAL HIGH (ref 0.00–0.07)
Basophils Absolute: 0 10*3/uL (ref 0.0–0.1)
Basophils Relative: 0 %
Eosinophils Absolute: 0 10*3/uL (ref 0.0–0.5)
Eosinophils Relative: 0 %
HCT: 28.2 % — ABNORMAL LOW (ref 39.0–52.0)
Hemoglobin: 8.9 g/dL — ABNORMAL LOW (ref 13.0–17.0)
Immature Granulocytes: 1 %
Lymphocytes Relative: 13 %
Lymphs Abs: 2.8 10*3/uL (ref 0.7–4.0)
MCH: 26.2 pg (ref 26.0–34.0)
MCHC: 31.6 g/dL (ref 30.0–36.0)
MCV: 82.9 fL (ref 80.0–100.0)
Monocytes Absolute: 2.2 10*3/uL — ABNORMAL HIGH (ref 0.1–1.0)
Monocytes Relative: 10 %
Neutro Abs: 16.6 10*3/uL — ABNORMAL HIGH (ref 1.7–7.7)
Neutrophils Relative %: 76 %
Platelets: 422 10*3/uL — ABNORMAL HIGH (ref 150–400)
RBC: 3.4 MIL/uL — ABNORMAL LOW (ref 4.22–5.81)
RDW: 14 % (ref 11.5–15.5)
WBC: 21.8 10*3/uL — ABNORMAL HIGH (ref 4.0–10.5)
nRBC: 0 % (ref 0.0–0.2)

## 2020-01-16 LAB — COMPREHENSIVE METABOLIC PANEL
ALT: 59 U/L — ABNORMAL HIGH (ref 0–44)
AST: 54 U/L — ABNORMAL HIGH (ref 15–41)
Albumin: 2.6 g/dL — ABNORMAL LOW (ref 3.5–5.0)
Alkaline Phosphatase: 117 U/L (ref 38–126)
Anion gap: 11 (ref 5–15)
BUN: 14 mg/dL (ref 8–23)
CO2: 24 mmol/L (ref 22–32)
Calcium: 9 mg/dL (ref 8.9–10.3)
Chloride: 95 mmol/L — ABNORMAL LOW (ref 98–111)
Creatinine, Ser: 0.54 mg/dL — ABNORMAL LOW (ref 0.61–1.24)
GFR, Estimated: >60 mL/min (ref >60)
Glucose, Bld: 191 mg/dL — ABNORMAL HIGH (ref 70–99)
Potassium: 4.2 mmol/L (ref 3.5–5.1)
Sodium: 130 mmol/L — ABNORMAL LOW (ref 135–145)
Total Bilirubin: 0.4 mg/dL (ref 0.3–1.2)
Total Protein: 8.2 g/dL — ABNORMAL HIGH (ref 6.5–8.1)

## 2020-01-16 LAB — GLUCOSE, CAPILLARY
Glucose-Capillary: 178 mg/dL — ABNORMAL HIGH (ref 70–99)
Glucose-Capillary: 283 mg/dL — ABNORMAL HIGH (ref 70–99)
Glucose-Capillary: 331 mg/dL — ABNORMAL HIGH (ref 70–99)
Glucose-Capillary: 308 mg/dL — ABNORMAL HIGH (ref 70–99)

## 2020-01-16 LAB — MAGNESIUM: Magnesium: 2.1 mg/dL (ref 1.7–2.4)

## 2020-01-16 LAB — CK: Total CK: 3828 U/L — ABNORMAL HIGH (ref 49–397)

## 2020-01-16 MED ORDER — TRAZODONE HCL 50 MG PO TABS
50.0000 mg | ORAL_TABLET | Freq: Every evening | ORAL | Status: DC | PRN
  Administered 2020-01-16: 50 mg via ORAL
  Filled 2020-01-16: qty 1

## 2020-01-16 MED ORDER — TRAZODONE HCL 50 MG PO TABS: 25.0000 mg | ORAL_TABLET | Freq: Every evening | ORAL | Status: DC | PRN

## 2020-01-16 MED ORDER — INSULIN ASPART 100 UNIT/ML ~~LOC~~ SOLN
10.0000 [IU] | Freq: Three times a day (TID) | SUBCUTANEOUS | Status: DC
  Administered 2020-01-16 – 2020-01-17 (×3): 10 [IU] via SUBCUTANEOUS

## 2020-01-16 MED ORDER — INSULIN ASPART 100 UNIT/ML ~~LOC~~ SOLN
6.0000 [IU] | Freq: Three times a day (TID) | SUBCUTANEOUS | Status: DC
  Administered 2020-01-16 (×2): 6 [IU] via SUBCUTANEOUS

## 2020-01-16 MED ORDER — ENSURE ENLIVE PO LIQD
237.0000 mL | Freq: Two times a day (BID) | ORAL | Status: DC
  Administered 2020-01-17 (×2): 237 mL via ORAL

## 2020-01-16 NOTE — Progress Notes (Signed)
Inpatient Diabetes Program Recommendations  AACE/ADA: New Consensus Statement on Inpatient Glycemic Control   Target Ranges:  Prepandial:   less than 140 mg/dL      Peak postprandial:   less than 180 mg/dL (1-2 hours)      Critically ill patients:  140 - 180 mg/dL   Results for [name], [name] (MRN [id number]) as of 01/16/2020 08:24  Ref. Range 01/15/2020 07:31 01/15/2020 11:00 01/15/2020 15:52 01/15/2020 21:38 01/16/2020 07:57  Glucose-Capillary Latest Ref Range: 70 - 99 mg/dL 372 (H) 262 (H) 279 (H) 235 (H) 178 (H)    Review of Glycemic Control  Diabetes history:DM2 Outpatient Diabetes medications:Lantus 100 units daily, Novolog 24-30 units TID with meals, Glipizide 10 mg daily, Metformin XR 500 mg BID Current orders for Inpatient glycemic control:Lantus 22 units BID, Novolog 0-20 units TID with meals, Novolog 0-5 units QHS, Novolog 6 units TID with meals; Decadron 6 mg daily  Inpatient Diabetes Program Recommendations:  Insulin: If steroids are continued as ordered, please consider increasing meal coverage to Novolog 12 units TID with meals if patient eats at least 50% of meals.  Thanks, Barnie Alderman, RN, MSN, CDE Diabetes Coordinator Inpatient Diabetes Program [phone] (Team Pager from 8am to 5pm)

## 2020-01-16 NOTE — TOC Progression Note (Addendum)
Transition of Care [facility]) - Progression Note    Patient Details  Name: Rainey Kahrs MRN: [id number] Date of Birth: 1961/02/01  Transition of Care [facility]) CM/SW Contact  Ihor Gully, LCSW Phone Number: 01/16/2020, 2:42 PM  Clinical Narrative:    Spoke with son discussed that patient continues to not have bed offers for SNF. Reiterated that patient had been accepted for HHPT with [facility]. Mr. Jaggi states that he and his wife's work schedules will not allow for them to offer extra assistance currently. He inquired about PCA services. Discussed agencies. Completed paperwork and will fax to [facility] services upon attending completion.    Expected Discharge Plan: [place] Barriers to Discharge: Continued Medical Work up, SNF Pending bed offer  Expected Discharge Plan and Services Expected Discharge Plan: [place] In-house Referral: Clinical Social Work Discharge Planning Services: NA Post Acute Care Choice: [place] Living arrangements for the past 2 months: [place] Expected Discharge Date: 01/14/20                         HH Arranged: PT [place] Agency: [facility] ([place]) Date [place]: 01/13/20 Time [place]: 1604 Representative spoke with at [place]: [place] ([place]) Interventions    Readmission Risk Interventions No flowsheet data found.

## 2020-01-16 NOTE — Progress Notes (Signed)
01/16/2020 3:28 PM  Pt remains stable for discharge.  Awaiting TOC team.  Working on [name] for home as he has not been accepted by any SNF at this time.  He is clinically stable.  See dictated discharge summary.   Nathan Natal MD  How to contact the [facility] Attending or Consulting provider [place] or covering provider during after hours [place], for this patient?  1. Check the care team in [facility] and look for a) attending/consulting TRH provider listed and b) the [facility] team listed 2. Log into [email or web address] and use [place]'s universal password to access. If you do not have the password, please contact the hospital operator. 3. Locate the [facility] provider you are looking for under [facility] and page to a number that you can be directly reached. 4. If you still have difficulty reaching the provider, please page the [facility] (Director on Call) for the Hospitalists listed on amion for assistance.

## 2020-01-16 NOTE — Plan of Care (Signed)

## 2020-01-17 LAB — GLUCOSE, CAPILLARY
Glucose-Capillary: 156 mg/dL — ABNORMAL HIGH (ref 70–99)
Glucose-Capillary: 147 mg/dL — ABNORMAL HIGH (ref 70–99)
Glucose-Capillary: 281 mg/dL — ABNORMAL HIGH (ref 70–99)
Glucose-Capillary: 359 mg/dL — ABNORMAL HIGH (ref 70–99)

## 2020-01-17 NOTE — Progress Notes (Signed)
IV removed from pt without complication.  Assisted pt with dressing and transfer into wheelchair. AVS provided to son at vehicle.  Informed him that the home health agency will be in contact with them to set up services.  He acknowledged understanding.

## 2020-01-17 NOTE — Progress Notes (Addendum)
01/17/2020 11:11 AM  Pt remains stable for discharge.  Awaiting TOC team.  Working on [facility] for home as he has not been accepted by any SNF at this time.  He is clinically stable.  Pt eating and drinking well and mentation much improved.  Pt agreeable to go home if we can get him some personal care assistance.  See dictated discharge summary.   Update: I received a communication message from [place] that patient can safely be discharged home today with home health as arranged and they have made arrangements for personal care assistant for patient.  His mentation is improved to his baseline and he can discharge home today.    Murvin Natal MD  How to contact the [facility] Attending or Consulting provider [place] or covering provider during after hours [place], for this patient?  1. Check the care team in [facility] and look for a) attending/consulting TRH provider listed and b) the [facility] team listed 2. Log into [email or web address] and use [place]'s universal password to access. If you do not have the password, please contact the hospital operator. 3. Locate the [facility] provider you are looking for under [facility] and page to a number that you can be directly reached. 4. If you still have difficulty reaching the provider, please page the [facility] (Director on Call) for the Hospitalists listed on amion for assistance.

## 2020-01-17 NOTE — TOC Transition Note (Signed)
Transition of Care [facility]) - CM/SW Discharge Note   Patient Details  Name: [name] MRN: [id number] Date of Birth: [birth year]  Transition of [facility]) CM/SW Contact:  Natasha Bence, LCSW Phone Number: 01/17/2020, 2:27 PM   Clinical Narrative:    CSW obtained remainder paperwork from MD for [place]. CSW faxed 5 pg document to [facility] [phone]. CSW notified of patient's readiness for discharge CSW notified [facility] of patient's discharge. Corene Cornea with [facility] agreeable to begin services upon discharge. TOC signing off.   Final next level of care: [place] Barriers to Discharge: Barriers Resolved   Patient Goals and CMS Choice Patient states their goals for this hospitalization and ongoing recovery are:: Home with [facility] CMS Medicare.gov Compare Post Acute Care list provided to:: Patient Choice offered to / list presented to : Patient  Discharge Placement                  Name of family member notified: [place] Pilar Grammes Patient and family notified of of transfer: 01/17/20  Discharge Plan and Services In-house Referral: Clinical Social Work Discharge Planning Services: NA Post Acute Care Choice: [place]: PT [facility] Agency: [place] ([place]) Date HH Agency Contacted: 01/17/20 Time [place]: 1025 Representative spoke with at [place]: [place] ([place]) Interventions     Readmission Risk Interventions No flowsheet data found.

## 2020-01-19 ENCOUNTER — Observation Stay (HOSPITAL_COMMUNITY): Payer: Medicaid Other | Admitting: Anesthesiology

## 2020-01-19 ENCOUNTER — Other Ambulatory Visit: Payer: Self-pay

## 2020-01-19 ENCOUNTER — Inpatient Hospital Stay (HOSPITAL_COMMUNITY)
Admission: EM | Admit: 2020-01-19 | Discharge: 2020-02-01 | DRG: 271 | Disposition: A | Discharge status: Skilled Nursing Facility | Payer: Medicaid Other | Attending: Family Medicine | Admitting: Family Medicine

## 2020-01-19 ENCOUNTER — Encounter (HOSPITAL_COMMUNITY): Payer: Self-pay

## 2020-01-19 ENCOUNTER — Emergency Department (HOSPITAL_COMMUNITY): Payer: Medicaid Other

## 2020-01-19 ENCOUNTER — Encounter (HOSPITAL_COMMUNITY)
Admission: EM | Disposition: A | Discharge status: Skilled Nursing Facility | Payer: Self-pay | Source: Home / Self Care | Attending: Internal Medicine

## 2020-01-19 DIAGNOSIS — E1151 Type 2 diabetes mellitus with diabetic peripheral angiopathy without gangrene: Secondary | ICD-10-CM | POA: Diagnosis not present

## 2020-01-19 DIAGNOSIS — I75022 Atheroembolism of left lower extremity: Secondary | ICD-10-CM

## 2020-01-19 DIAGNOSIS — I70202 Unspecified atherosclerosis of native arteries of extremities, left leg: Secondary | ICD-10-CM | POA: Diagnosis not present

## 2020-01-19 DIAGNOSIS — Y835 Amputation of limb(s) as the cause of abnormal reaction of the patient, or of later complication, without mention of misadventure at the time of the procedure: Secondary | ICD-10-CM | POA: Diagnosis present

## 2020-01-19 DIAGNOSIS — G8929 Other chronic pain: Secondary | ICD-10-CM | POA: Diagnosis present

## 2020-01-19 DIAGNOSIS — I745 Embolism and thrombosis of iliac artery: Secondary | ICD-10-CM | POA: Diagnosis not present

## 2020-01-19 DIAGNOSIS — I10 Essential (primary) hypertension: Secondary | ICD-10-CM | POA: Diagnosis not present

## 2020-01-19 DIAGNOSIS — Z833 Family history of diabetes mellitus: Secondary | ICD-10-CM

## 2020-01-19 DIAGNOSIS — Z7984 Long term (current) use of oral hypoglycemic drugs: Secondary | ICD-10-CM

## 2020-01-19 DIAGNOSIS — D649 Anemia, unspecified: Secondary | ICD-10-CM | POA: Diagnosis not present

## 2020-01-19 DIAGNOSIS — T8789 Other complications of amputation stump: Secondary | ICD-10-CM | POA: Diagnosis not present

## 2020-01-19 DIAGNOSIS — B952 Enterococcus as the cause of diseases classified elsewhere: Secondary | ICD-10-CM | POA: Diagnosis not present

## 2020-01-19 DIAGNOSIS — B9689 Other specified bacterial agents as the cause of diseases classified elsewhere: Secondary | ICD-10-CM | POA: Diagnosis not present

## 2020-01-19 DIAGNOSIS — D62 Acute posthemorrhagic anemia: Secondary | ICD-10-CM | POA: Diagnosis not present

## 2020-01-19 DIAGNOSIS — Z87891 Personal history of nicotine dependence: Secondary | ICD-10-CM

## 2020-01-19 DIAGNOSIS — I998 Other disorder of circulatory system: Principal | ICD-10-CM | POA: Diagnosis present

## 2020-01-19 DIAGNOSIS — T8141XA Infection following a procedure, superficial incisional surgical site, initial encounter: Secondary | ICD-10-CM | POA: Diagnosis not present

## 2020-01-19 DIAGNOSIS — M6282 Rhabdomyolysis: Secondary | ICD-10-CM | POA: Diagnosis present

## 2020-01-19 DIAGNOSIS — R748 Abnormal levels of other serum enzymes: Secondary | ICD-10-CM | POA: Diagnosis present

## 2020-01-19 DIAGNOSIS — Z7952 Long term (current) use of systemic steroids: Secondary | ICD-10-CM

## 2020-01-19 DIAGNOSIS — L89322 Pressure ulcer of left buttock, stage 2: Secondary | ICD-10-CM | POA: Diagnosis not present

## 2020-01-19 DIAGNOSIS — E782 Mixed hyperlipidemia: Secondary | ICD-10-CM | POA: Diagnosis not present

## 2020-01-19 DIAGNOSIS — Z8616 Personal history of COVID-19: Secondary | ICD-10-CM | POA: Diagnosis not present

## 2020-01-19 DIAGNOSIS — K5900 Constipation, unspecified: Secondary | ICD-10-CM | POA: Diagnosis not present

## 2020-01-19 DIAGNOSIS — D509 Iron deficiency anemia, unspecified: Secondary | ICD-10-CM | POA: Diagnosis not present

## 2020-01-19 DIAGNOSIS — E1165 Type 2 diabetes mellitus with hyperglycemia: Secondary | ICD-10-CM | POA: Diagnosis not present

## 2020-01-19 DIAGNOSIS — D638 Anemia in other chronic diseases classified elsewhere: Secondary | ICD-10-CM | POA: Diagnosis not present

## 2020-01-19 DIAGNOSIS — E871 Hypo-osmolality and hyponatremia: Secondary | ICD-10-CM | POA: Diagnosis present

## 2020-01-19 DIAGNOSIS — Z79899 Other long term (current) drug therapy: Secondary | ICD-10-CM

## 2020-01-19 DIAGNOSIS — E1159 Type 2 diabetes mellitus with other circulatory complications: Secondary | ICD-10-CM | POA: Diagnosis not present

## 2020-01-19 DIAGNOSIS — Z794 Long term (current) use of insulin: Secondary | ICD-10-CM

## 2020-01-19 DIAGNOSIS — Y838 Other surgical procedures as the cause of abnormal reaction of the patient, or of later complication, without mention of misadventure at the time of the procedure: Secondary | ICD-10-CM | POA: Diagnosis not present

## 2020-01-19 DIAGNOSIS — D75839 Thrombocytosis, unspecified: Secondary | ICD-10-CM | POA: Diagnosis present

## 2020-01-19 DIAGNOSIS — Z89512 Acquired absence of left leg below knee: Secondary | ICD-10-CM | POA: Diagnosis not present

## 2020-01-19 DIAGNOSIS — L8990 Pressure ulcer of unspecified site, unspecified stage: Secondary | ICD-10-CM | POA: Insufficient documentation

## 2020-01-19 DIAGNOSIS — Z800 Family history of malignant neoplasm of digestive organs: Secondary | ICD-10-CM

## 2020-01-19 DIAGNOSIS — K219 Gastro-esophageal reflux disease without esophagitis: Secondary | ICD-10-CM | POA: Diagnosis present

## 2020-01-19 HISTORY — PX: ENDARTERECTOMY FEMORAL: SHX5804

## 2020-01-19 LAB — GLUCOSE, CAPILLARY
Glucose-Capillary: 118 mg/dL — ABNORMAL HIGH (ref 70–99)
Glucose-Capillary: 146 mg/dL — ABNORMAL HIGH (ref 70–99)
Glucose-Capillary: 155 mg/dL — ABNORMAL HIGH (ref 70–99)

## 2020-01-19 LAB — I-STAT CHEM 8, ED
BUN: 16 mg/dL (ref 8–23)
Calcium, Ion: 1.02 mmol/L — ABNORMAL LOW (ref 1.15–1.40)
Chloride: 100 mmol/L (ref 98–111)
Creatinine, Ser: 0.5 mg/dL — ABNORMAL LOW (ref 0.61–1.24)
Glucose, Bld: 145 mg/dL — ABNORMAL HIGH (ref 70–99)
HCT: 27 % — ABNORMAL LOW (ref 39.0–52.0)
Hemoglobin: 9.2 g/dL — ABNORMAL LOW (ref 13.0–17.0)
Potassium: 4.3 mmol/L (ref 3.5–5.1)
Sodium: 134 mmol/L — ABNORMAL LOW (ref 135–145)
TCO2: 23 mmol/L (ref 22–32)

## 2020-01-19 LAB — BASIC METABOLIC PANEL
Anion gap: 12 (ref 5–15)
BUN: 16 mg/dL (ref 8–23)
CO2: 23 mmol/L (ref 22–32)
Calcium: 8.8 mg/dL — ABNORMAL LOW (ref 8.9–10.3)
Chloride: 98 mmol/L (ref 98–111)
Creatinine, Ser: 0.58 mg/dL — ABNORMAL LOW (ref 0.61–1.24)
GFR, Estimated: >60 mL/min (ref >60)
Glucose, Bld: 145 mg/dL — ABNORMAL HIGH (ref 70–99)
Potassium: 4.5 mmol/L (ref 3.5–5.1)
Sodium: 133 mmol/L — ABNORMAL LOW (ref 135–145)

## 2020-01-19 LAB — CBC
HCT: 28.9 % — ABNORMAL LOW (ref 39.0–52.0)
Hemoglobin: 9.1 g/dL — ABNORMAL LOW (ref 13.0–17.0)
MCH: 27.2 pg (ref 26.0–34.0)
MCHC: 31.5 g/dL (ref 30.0–36.0)
MCV: 86.5 fL (ref 80.0–100.0)
Platelets: 566 10*3/uL — ABNORMAL HIGH (ref 150–400)
RBC: 3.34 MIL/uL — ABNORMAL LOW (ref 4.22–5.81)
RDW: 14.5 % (ref 11.5–15.5)
WBC: 19.9 10*3/uL — ABNORMAL HIGH (ref 4.0–10.5)
nRBC: 0 % (ref 0.0–0.2)

## 2020-01-19 LAB — CK: Total CK: 3385 U/L — ABNORMAL HIGH (ref 49–397)

## 2020-01-19 SURGERY — ENDARTERECTOMY, FEMORAL
Anesthesia: General | Site: Groin | Laterality: Left

## 2020-01-19 MED ORDER — SUCCINYLCHOLINE CHLORIDE 20 MG/ML IJ SOLN
INTRAMUSCULAR | Status: DC | PRN
  Administered 2020-01-19: 160 mg via INTRAVENOUS

## 2020-01-19 MED ORDER — INSULIN ASPART 100 UNIT/ML ~~LOC~~ SOLN
0.0000 [IU] | SUBCUTANEOUS | Status: DC
  Administered 2020-01-20: 1 [IU] via SUBCUTANEOUS
  Administered 2020-01-20 (×3): 3 [IU] via SUBCUTANEOUS
  Administered 2020-01-20: 2 [IU] via SUBCUTANEOUS
  Administered 2020-01-21: 1 [IU] via SUBCUTANEOUS
  Administered 2020-01-21: 2 [IU] via SUBCUTANEOUS
  Administered 2020-01-21: 5 [IU] via SUBCUTANEOUS
  Administered 2020-01-21: 1 [IU] via SUBCUTANEOUS
  Administered 2020-01-21: 2 [IU] via SUBCUTANEOUS
  Administered 2020-01-21 (×2): 3 [IU] via SUBCUTANEOUS
  Administered 2020-01-22: 5 [IU] via SUBCUTANEOUS
  Administered 2020-01-22: 3 [IU] via SUBCUTANEOUS
  Administered 2020-01-22: 2 [IU] via SUBCUTANEOUS
  Administered 2020-01-22: 3 [IU] via SUBCUTANEOUS
  Administered 2020-01-23: 2 [IU] via SUBCUTANEOUS
  Administered 2020-01-23 (×3): 3 [IU] via SUBCUTANEOUS

## 2020-01-19 MED ORDER — OXYCODONE-ACETAMINOPHEN 5-325 MG PO TABS
1.0000 | ORAL_TABLET | ORAL | Status: DC | PRN
  Administered 2020-01-20: 1 via ORAL
  Administered 2020-01-20 – 2020-02-01 (×27): 2 via ORAL
  Filled 2020-01-19 (×20): qty 2
  Filled 2020-01-19: qty 1
  Filled 2020-01-19 (×6): qty 2

## 2020-01-19 MED ORDER — DOCUSATE SODIUM 100 MG PO CAPS
100.0000 mg | ORAL_CAPSULE | Freq: Every day | ORAL | Status: DC
  Administered 2020-01-20 – 2020-02-01 (×12): 100 mg via ORAL
  Filled 2020-01-19 (×12): qty 1

## 2020-01-19 MED ORDER — ROCURONIUM BROMIDE 10 MG/ML (PF) SYRINGE
PREFILLED_SYRINGE | INTRAVENOUS | Status: DC | PRN
  Administered 2020-01-19: 10 mg via INTRAVENOUS
  Administered 2020-01-19: 30 mg via INTRAVENOUS
  Administered 2020-01-19: 10 mg via INTRAVENOUS

## 2020-01-19 MED ORDER — ONDANSETRON HCL 4 MG/2ML IJ SOLN
4.0000 mg | Freq: Once | INTRAMUSCULAR | Status: AC
  Administered 2020-01-19: 4 mg via INTRAVENOUS
  Filled 2020-01-19: qty 2

## 2020-01-19 MED ORDER — PROPOFOL 10 MG/ML IV BOLUS
INTRAVENOUS | Status: DC | PRN
  Administered 2020-01-19: 20 mg via INTRAVENOUS
  Administered 2020-01-19: 90 mg via INTRAVENOUS

## 2020-01-19 MED ORDER — 0.9 % SODIUM CHLORIDE (POUR BTL) OPTIME
TOPICAL | Status: DC | PRN
  Administered 2020-01-19: 1000 mL

## 2020-01-19 MED ORDER — METFORMIN HCL ER 500 MG PO TB24
500.0000 mg | ORAL_TABLET | Freq: Two times a day (BID) | ORAL | Status: DC
  Administered 2020-01-20: 500 mg via ORAL
  Filled 2020-01-19: qty 1

## 2020-01-19 MED ORDER — FENTANYL CITRATE (PF) 100 MCG/2ML IJ SOLN
INTRAMUSCULAR | Status: DC | PRN
  Administered 2020-01-19 (×2): 25 ug via INTRAVENOUS
  Administered 2020-01-19: 50 ug via INTRAVENOUS
  Administered 2020-01-19: 150 ug via INTRAVENOUS

## 2020-01-19 MED ORDER — GLIPIZIDE ER 10 MG PO TB24
10.0000 mg | ORAL_TABLET | Freq: Every day | ORAL | Status: DC
  Filled 2020-01-19: qty 1

## 2020-01-19 MED ORDER — SUGAMMADEX SODIUM 200 MG/2ML IV SOLN
INTRAVENOUS | Status: DC | PRN
  Administered 2020-01-19: 200 mg via INTRAVENOUS

## 2020-01-19 MED ORDER — HEPARIN SODIUM (PORCINE) 1000 UNIT/ML IJ SOLN
INTRAMUSCULAR | Status: DC | PRN
  Administered 2020-01-19: 10000 [IU] via INTRAVENOUS

## 2020-01-19 MED ORDER — SODIUM CHLORIDE 0.9 % IV SOLN: 500.0000 mL | Freq: Once | INTRAVENOUS | Status: DC | PRN

## 2020-01-19 MED ORDER — MAGNESIUM SULFATE 2 GM/50ML IV SOLN: 2.0000 g | Freq: Every day | INTRAVENOUS | Status: DC | PRN

## 2020-01-19 MED ORDER — HYDROMORPHONE HCL 1 MG/ML IJ SOLN
0.5000 mg | Freq: Once | INTRAMUSCULAR | Status: AC
  Administered 2020-01-19: 0.5 mg via INTRAVENOUS
  Filled 2020-01-19: qty 1

## 2020-01-19 MED ORDER — POLYETHYLENE GLYCOL 3350 17 G PO PACK
17.0000 g | PACK | Freq: Every day | ORAL | Status: DC | PRN
  Administered 2020-01-23: 17 g via ORAL
  Filled 2020-01-19: qty 1

## 2020-01-19 MED ORDER — ACETAMINOPHEN 650 MG RE SUPP: 650.0000 mg | Freq: Four times a day (QID) | RECTAL | Status: DC | PRN

## 2020-01-19 MED ORDER — ROSUVASTATIN CALCIUM 5 MG PO TABS
10.0000 mg | ORAL_TABLET | Freq: Every day | ORAL | Status: DC
  Administered 2020-01-20 – 2020-01-21 (×2): 10 mg via ORAL
  Filled 2020-01-19 (×2): qty 2

## 2020-01-19 MED ORDER — ONDANSETRON HCL 4 MG/2ML IJ SOLN: 4.0000 mg | Freq: Four times a day (QID) | INTRAMUSCULAR | Status: DC | PRN

## 2020-01-19 MED ORDER — HYDROMORPHONE HCL 1 MG/ML IJ SOLN
0.5000 mg | INTRAMUSCULAR | Status: DC | PRN
  Administered 2020-01-21 – 2020-01-29 (×9): 1 mg via INTRAVENOUS
  Filled 2020-01-19 (×9): qty 1

## 2020-01-19 MED ORDER — ASPIRIN EC 81 MG PO TBEC
81.0000 mg | DELAYED_RELEASE_TABLET | Freq: Every day | ORAL | Status: DC
  Administered 2020-01-20 – 2020-02-01 (×12): 81 mg via ORAL
  Filled 2020-01-19 (×13): qty 1

## 2020-01-19 MED ORDER — METOPROLOL SUCCINATE ER 25 MG PO TB24
25.0000 mg | ORAL_TABLET | Freq: Every day | ORAL | Status: DC
  Administered 2020-01-20 – 2020-02-01 (×12): 25 mg via ORAL
  Filled 2020-01-19 (×12): qty 1

## 2020-01-19 MED ORDER — CEFAZOLIN SODIUM-DEXTROSE 2-4 GM/100ML-% IV SOLN
2.0000 g | Freq: Three times a day (TID) | INTRAVENOUS | Status: AC
  Administered 2020-01-20 (×2): 2 g via INTRAVENOUS
  Filled 2020-01-19 (×2): qty 100

## 2020-01-19 MED ORDER — ALBUMIN HUMAN 5 % IV SOLN: INTRAVENOUS | Status: DC | PRN

## 2020-01-19 MED ORDER — OXYCODONE HCL 5 MG PO TABS: 5.0000 mg | ORAL_TABLET | Freq: Once | ORAL | Status: DC | PRN

## 2020-01-19 MED ORDER — PHENOL 1.4 % MT LIQD: 1.0000 | OROMUCOSAL | Status: DC | PRN

## 2020-01-19 MED ORDER — SODIUM CHLORIDE 0.9 % IV SOLN: INTRAVENOUS | Status: DC | PRN

## 2020-01-19 MED ORDER — ACETAMINOPHEN 500 MG PO TABS: 1000.0000 mg | ORAL_TABLET | Freq: Once | ORAL | Status: DC | PRN

## 2020-01-19 MED ORDER — SODIUM CHLORIDE 0.9 % IV SOLN: INTRAVENOUS | Status: AC

## 2020-01-19 MED ORDER — CEFAZOLIN SODIUM-DEXTROSE 2-3 GM-%(50ML) IV SOLR
INTRAVENOUS | Status: DC | PRN
  Administered 2020-01-19: 2 g via INTRAVENOUS

## 2020-01-19 MED ORDER — SODIUM CHLORIDE 0.9 % IV SOLN
INTRAVENOUS | Status: AC
  Filled 2020-01-19: qty 1.2

## 2020-01-19 MED ORDER — AMITRIPTYLINE HCL 50 MG PO TABS
25.0000 mg | ORAL_TABLET | Freq: Every day | ORAL | Status: DC
  Administered 2020-01-20 – 2020-01-31 (×13): 25 mg via ORAL
  Filled 2020-01-19 (×13): qty 1

## 2020-01-19 MED ORDER — ONDANSETRON HCL 4 MG PO TABS: 4.0000 mg | ORAL_TABLET | Freq: Four times a day (QID) | ORAL | Status: DC | PRN

## 2020-01-19 MED ORDER — OXYCODONE HCL 5 MG/5ML PO SOLN: 5.0000 mg | Freq: Once | ORAL | Status: DC | PRN

## 2020-01-19 MED ORDER — HEPARIN BOLUS VIA INFUSION
5000.0000 [IU] | Freq: Once | INTRAVENOUS | Status: AC
  Administered 2020-01-19: 5000 [IU] via INTRAVENOUS

## 2020-01-19 MED ORDER — HEPARIN (PORCINE) 25000 UT/250ML-% IV SOLN
1400.0000 [IU]/h | INTRAVENOUS | Status: DC
  Administered 2020-01-19: 1400 [IU]/h via INTRAVENOUS
  Filled 2020-01-19: qty 250

## 2020-01-19 MED ORDER — HEPARIN (PORCINE) 25000 UT/250ML-% IV SOLN
INTRAVENOUS | Status: DC | PRN
  Administered 2020-01-19: 14 [IU]/h via INTRAVENOUS

## 2020-01-19 MED ORDER — POTASSIUM CHLORIDE CRYS ER 20 MEQ PO TBCR: 20.0000 meq | EXTENDED_RELEASE_TABLET | Freq: Every day | ORAL | Status: DC | PRN

## 2020-01-19 MED ORDER — PHENYLEPHRINE HCL (PRESSORS) 10 MG/ML IV SOLN
INTRAVENOUS | Status: DC | PRN
  Administered 2020-01-19: 150 ug via INTRAVENOUS

## 2020-01-19 MED ORDER — LIDOCAINE HCL (CARDIAC) PF 100 MG/5ML IV SOSY
PREFILLED_SYRINGE | INTRAVENOUS | Status: DC | PRN
  Administered 2020-01-19: 60 mg via INTRAVENOUS

## 2020-01-19 MED ORDER — ACETAMINOPHEN 10 MG/ML IV SOLN: 1000.0000 mg | Freq: Once | INTRAVENOUS | Status: DC | PRN

## 2020-01-19 MED ORDER — LABETALOL HCL 5 MG/ML IV SOLN: 10.0000 mg | INTRAVENOUS | Status: DC | PRN

## 2020-01-19 MED ORDER — MORPHINE SULFATE (PF) 2 MG/ML IV SOLN: 1.0000 mg | INTRAVENOUS | Status: DC | PRN

## 2020-01-19 MED ORDER — PHENYLEPHRINE HCL-NACL 10-0.9 MG/250ML-% IV SOLN
INTRAVENOUS | Status: DC | PRN
  Administered 2020-01-19: 50 ug/min via INTRAVENOUS

## 2020-01-19 MED ORDER — PHENYLEPHRINE HCL (PRESSORS) 10 MG/ML IV SOLN
INTRAVENOUS | Status: DC | PRN
  Administered 2020-01-19: 40 ug via INTRAVENOUS

## 2020-01-19 MED ORDER — HEPARIN (PORCINE) 25000 UT/250ML-% IV SOLN
2150.0000 [IU]/h | INTRAVENOUS | Status: DC
  Administered 2020-01-20 (×2): 1400 [IU]/h via INTRAVENOUS
  Administered 2020-01-21: 1900 [IU]/h via INTRAVENOUS
  Administered 2020-01-21: 2150 [IU]/h via INTRAVENOUS
  Filled 2020-01-19 (×3): qty 250

## 2020-01-19 MED ORDER — PANTOPRAZOLE SODIUM 40 MG PO TBEC
40.0000 mg | DELAYED_RELEASE_TABLET | Freq: Every day | ORAL | Status: DC
  Administered 2020-01-20 – 2020-02-01 (×12): 40 mg via ORAL
  Filled 2020-01-19 (×12): qty 1

## 2020-01-19 MED ORDER — HYDRALAZINE HCL 20 MG/ML IJ SOLN: 5.0000 mg | INTRAMUSCULAR | Status: DC | PRN

## 2020-01-19 MED ORDER — ONDANSETRON HCL 4 MG/2ML IJ SOLN
INTRAMUSCULAR | Status: DC | PRN
  Administered 2020-01-19: 4 mg via INTRAVENOUS

## 2020-01-19 MED ORDER — FENTANYL CITRATE (PF) 100 MCG/2ML IJ SOLN: 25.0000 ug | INTRAMUSCULAR | Status: DC | PRN

## 2020-01-19 MED ORDER — METOPROLOL TARTRATE 5 MG/5ML IV SOLN: 2.0000 mg | INTRAVENOUS | Status: DC | PRN

## 2020-01-19 MED ORDER — ACETAMINOPHEN 325 MG PO TABS
650.0000 mg | ORAL_TABLET | Freq: Four times a day (QID) | ORAL | Status: DC | PRN
  Filled 2020-01-19: qty 2

## 2020-01-19 MED ORDER — AMLODIPINE BESYLATE 10 MG PO TABS
10.0000 mg | ORAL_TABLET | Freq: Every day | ORAL | Status: DC
  Administered 2020-01-20 – 2020-02-01 (×11): 10 mg via ORAL
  Filled 2020-01-19 (×12): qty 1

## 2020-01-19 MED ORDER — GUAIFENESIN-DM 100-10 MG/5ML PO SYRP: 15.0000 mL | ORAL_SOLUTION | ORAL | Status: DC | PRN

## 2020-01-19 MED ORDER — AMLODIPINE BESYLATE 5 MG PO TABS: 10.0000 mg | ORAL_TABLET | Freq: Every day | ORAL | Status: DC

## 2020-01-19 MED ORDER — ALUM & MAG HYDROXIDE-SIMETH 200-200-20 MG/5ML PO SUSP: 15.0000 mL | ORAL | Status: DC | PRN

## 2020-01-19 MED ORDER — DEXAMETHASONE 2 MG PO TABS
2.0000 mg | ORAL_TABLET | Freq: Three times a day (TID) | ORAL | Status: DC
  Filled 2020-01-19: qty 1

## 2020-01-19 MED ORDER — ACETAMINOPHEN 160 MG/5ML PO SOLN: 1000.0000 mg | Freq: Once | ORAL | Status: DC | PRN

## 2020-01-19 MED ORDER — LACTATED RINGERS IV SOLN: INTRAVENOUS | Status: DC | PRN

## 2020-01-19 MED ORDER — FENTANYL CITRATE (PF) 250 MCG/5ML IJ SOLN
INTRAMUSCULAR | Status: AC
  Filled 2020-01-19: qty 5

## 2020-01-19 SURGICAL SUPPLY — 44 items
CANISTER SUCT 3000ML PPV (MISCELLANEOUS) ×2 IMPLANT
CANNULA VESSEL 3MM 2 BLNT TIP (CANNULA) ×2 IMPLANT
CATH EMB 4FR 80CM (CATHETERS) ×2 IMPLANT
CATH EMB 5FR 80CM (CATHETERS) ×2 IMPLANT
CATH FOLEY LATEX FREE 16FR (CATHETERS) ×1
CATH FOLEY LF 16FR (CATHETERS) ×1 IMPLANT
CLIP VESOCCLUDE MED 24/CT (CLIP) ×2 IMPLANT
CLIP VESOCCLUDE SM WIDE 24/CT (CLIP) ×2 IMPLANT
CNTNR URN SCR LID CUP LEK RST (MISCELLANEOUS) ×1 IMPLANT
CONT SPEC 4OZ STRL OR WHT (MISCELLANEOUS) ×1
COVER WAND RF STERILE (DRAPES) ×2 IMPLANT
DERMABOND ADVANCED (GAUZE/BANDAGES/DRESSINGS) ×1
DERMABOND ADVANCED .7 DNX12 (GAUZE/BANDAGES/DRESSINGS) ×1 IMPLANT
DRAIN HEMOVAC 1/8 X 5 (WOUND CARE) IMPLANT
DRSG MEPILEX SACRM 8.7X9.8 (GAUZE/BANDAGES/DRESSINGS) ×2 IMPLANT
ELECT REM PT RETURN 9FT ADLT (ELECTROSURGICAL) ×2
ELECTRODE REM PT RTRN 9FT ADLT (ELECTROSURGICAL) ×1 IMPLANT
EVACUATOR SILICONE 100CC (DRAIN) IMPLANT
GLOVE BIO SURGEON STRL SZ7.5 (GLOVE) ×2 IMPLANT
GLOVE BIOGEL PI IND STRL 7.0 (GLOVE) ×3 IMPLANT
GLOVE BIOGEL PI IND STRL 8.5 (GLOVE) ×1 IMPLANT
GLOVE BIOGEL PI INDICATOR 7.0 (GLOVE) ×3
GLOVE BIOGEL PI INDICATOR 8.5 (GLOVE) ×1
GLOVE SURG SS PI 6.5 STRL IVOR (GLOVE) ×2 IMPLANT
GOWN STRL REUS W/ TWL LRG LVL3 (GOWN DISPOSABLE) ×3 IMPLANT
GOWN STRL REUS W/TWL LRG LVL3 (GOWN DISPOSABLE) ×3
HEMOSTAT SPONGE AVITENE ULTRA (HEMOSTASIS) IMPLANT
KIT BASIN OR (CUSTOM PROCEDURE TRAY) ×2 IMPLANT
KIT TURNOVER KIT B (KITS) ×2 IMPLANT
NS IRRIG 1000ML POUR BTL (IV SOLUTION) ×4 IMPLANT
PACK PERIPHERAL VASCULAR (CUSTOM PROCEDURE TRAY) ×2 IMPLANT
PAD ARMBOARD 7.5X6 YLW CONV (MISCELLANEOUS) ×4 IMPLANT
SUT ETHILON 3 0 PS 1 (SUTURE) IMPLANT
SUT PROLENE 5 0 C 1 24 (SUTURE) ×2 IMPLANT
SUT PROLENE 6 0 CC (SUTURE) ×24 IMPLANT
SUT VIC AB 2-0 SH 27 (SUTURE) ×1
SUT VIC AB 2-0 SH 27XBRD (SUTURE) ×1 IMPLANT
SUT VIC AB 3-0 SH 27 (SUTURE) ×4
SUT VIC AB 3-0 SH 27X BRD (SUTURE) ×4 IMPLANT
SYR 3ML LL SCALE MARK (SYRINGE) ×4 IMPLANT
TOWEL GREEN STERILE (TOWEL DISPOSABLE) ×2 IMPLANT
TRAY FOLEY MTR SLVR 16FR STAT (SET/KITS/TRAYS/PACK) IMPLANT
UNDERPAD 30X36 HEAVY ABSORB (UNDERPADS AND DIAPERS) ×2 IMPLANT
WATER STERILE IRR 1000ML POUR (IV SOLUTION) ×2 IMPLANT

## 2020-01-19 NOTE — ED Notes (Signed)
Patient arrived with EMS and RN Heparin Running at 1400 units per hour. Patient is alert and oriented with no complaints at this time. Left BKA cold to the touch and painful. MD aware of arrival to ED.

## 2020-01-19 NOTE — Anesthesia Procedure Notes (Signed)
Procedure Name: Intubation Date/Time: 01/19/2020 8:17 PM Performed by: Eligha Bridegroom, CRNA Pre-anesthesia Checklist: Patient identified, Emergency Drugs available, Suction available, Patient being monitored and Timeout performed Patient Re-evaluated:Patient Re-evaluated prior to induction Oxygen Delivery Method: Circle system utilized Preoxygenation: Pre-oxygenation with 100% oxygen Induction Type: IV induction Ventilation: Mask ventilation without difficulty Grade View: Grade II Tube type: Oral Tube size: 7.5 mm Number of attempts: 1 Airway Equipment and Method: Stylet Placement Confirmation: positive ETCO2 and breath sounds checked- equal and bilateral Secured at: 22 cm Tube secured with: Tape Dental Injury: Teeth and Oropharynx as per pre-operative assessment

## 2020-01-19 NOTE — Progress Notes (Signed)
Report attempted x 1

## 2020-01-19 NOTE — Progress Notes (Signed)
Spoke to MD Fields only able to doppler femoral pulse and leg is cold.  MD aware and said this is all we will be able to get right now.

## 2020-01-19 NOTE — H&P (Signed)
History and Physical    [name] YBO:[id number] DOB: Aug 07, 1960 DOA: 01/19/2020  PCP: Vidal Schwalbe, MD   Patient coming from: Home   Chief Complaint: Left BKA stump pain   HPI: [name] is a 59 y.o. male with medical history significant for hypertension, hyperlipidemia, insulin-dependent diabetes mellitus, history of left BKA, and recent admission with acute encephalopathy suspected secondary to COVID-19, now presenting to the emergency department with pain at his left BKA stump and buttock.  Patient was discharged from the hospital couple days ago after being managed for acute encephalopathy and rhabdomyolysis, was found to be positive for COVID-19 on 01/08/2020 with minimal respiratory symptoms.  He went home with home health but now returns with pain at the left BKA stump antibiotic.  The patient attributes the buttock pain to pressure sore, denies any trauma to the left BKA stump, denies any fevers or drainage from the site, but has had worsening pain over the past 2 days which has become severe.  He reports a mild nonproductive cough.  He has been taking Motrin at home but the pain continues to worsen.  ED Course: Upon arrival to the ED, patient is found to be afebrile, saturating mid 90s on room air, and with stable blood pressure.  Chemistry panel is notable for slight hyponatremia.  CK is elevated to 3385.  CBC features a leukocytosis to 19,900, thrombocytosis, and stable normocytic anemia.  Vascular surgery was consulted by the ED physician and recommended IV heparin, n.p.o. for urgent embolectomy, and admission to hospitalist service.  Review of Systems:  All other systems reviewed and apart from HPI, are negative.  Past Medical History:  Diagnosis Date   Diabetes mellitus without complication ([place])    GERD (gastroesophageal reflux disease)    Gout    Hyperlipidemia    Hypertension    Insomnia    Osteoarthritis     Past Surgical History:    Procedure Laterality Date   AMPUTATION Left 10/05/2017   Procedure: LEFT BELOW KNEE AMPUTATION;  Surgeon: [name], MD;  Location: [facility];  Service: Orthopedics;  Laterality: Left;   COLONOSCOPY WITH PROPOFOL N/A 11/28/2018   Procedure: COLONOSCOPY WITH PROPOFOL;  Surgeon: Daneil Dolin, MD;  Location: [facility];  Service: Endoscopy;  Laterality: N/A;  7:30am   JOINT REPLACEMENT     right knee   LEG SURGERY     POLYPECTOMY  11/28/2018   Procedure: POLYPECTOMY;  Surgeon: Daneil Dolin, MD;  Location: [facility];  Service: Endoscopy;;  sigmoid polyp x 1, ascending colon x 5    Social History:   reports that he quit smoking about 2 years ago. His smoking use included cigarettes. He has a 50.00 pack-year smoking history. He has never used smokeless tobacco. He reports previous alcohol use. He reports that he does not use drugs.  No Known Allergies  Family History  Problem Relation Age of Onset   Diabetes Mother    Colon cancer Maternal Uncle        greater than age 76   Cancer Other        multiple family memebers on mother's side of family.      Prior to Admission medications   Medication Sig Start Date End Date Taking? Authorizing Provider  acetaminophen (TYLENOL) 325 MG tablet Take 2 tablets (650 mg total) by mouth every 6 (six) hours. 10/10/17   Love, Ivan Anchors, PA-C  amitriptyline (ELAVIL) 25 MG tablet Take 25 mg by mouth at bedtime.  [provider]  amLODipine (NORVASC) 10 MG tablet Take 1 tablet (10 mg total) by mouth daily. 10/18/17   Love, Ivan Anchors, PA-C  ascorbic acid (VITAMIN C) 500 MG tablet Take 1 tablet (500 mg total) by mouth daily. 01/15/20 02/14/20  Murlean Iba, MD  Cholecalciferol (VITAMIN D) 125 MCG (5000 UT) CAPS TAKE (1) CAPSULE BY MOUTH ONCE DAILY. 10/23/19   Cassandria Anger, MD  dexamethasone (DECADRON) 2 MG tablet Take 2 mg by mouth 3 (three) times daily. 01/14/20   [provider]  glipiZIDE (GLUCOTROL XL) 10  MG 24 hr tablet Take 1 tablet (10 mg total) by mouth daily. 11/10/19   Cassandria Anger, MD  glucose blood (ACCU-CHEK AVIVA PLUS) test strip CHECK BLOOD SUGAR 4 TIMES DAILY. 08/07/19   Cassandria Anger, MD  insulin aspart (NOVOLOG FLEXPEN) 100 UNIT/ML FlexPen Inject 24-30 Units into the skin 3 (three) times daily with meals. 11/10/19   Cassandria Anger, MD  insulin glargine (LANTUS SOLOSTAR) 100 UNIT/ML Solostar Pen qhs 11/10/19   Nida, Marella Chimes, MD  Insulin Pen Needle (ULTICARE MINI PEN NEEDLES) 31G X 6 MM MISC USE AS DIRECTED 08/07/19   Nida, Marella Chimes, MD  metFORMIN (GLUCOPHAGE-XR) 500 MG 24 hr tablet TAKE 1 TABLET BY MOUTH TWICE DAILY AFTER A MEAL Patient taking differently: Take 500 mg by mouth 2 (two) times daily. After meals 09/23/19   Cassandria Anger, MD  metoprolol succinate (TOPROL-XL) 25 MG 24 hr tablet Take 1 tablet (25 mg total) by mouth daily. 10/18/17   Love, Ivan Anchors, PA-C  pantoprazole (PROTONIX) 40 MG tablet Take 1 tablet (40 mg total) by mouth daily. 10/18/17   Love, Ivan Anchors, PA-C  polyethylene glycol (MIRALAX / GLYCOLAX) packet Take 17 g by mouth daily. Patient taking differently: Take 17 g by mouth daily as needed for moderate constipation.  10/18/17   Love, Ivan Anchors, PA-C  rosuvastatin (CRESTOR) 10 MG tablet Take 1 tablet (10 mg total) by mouth daily. 01/21/20   Johnson, Clanford L, MD  zinc sulfate 220 (50 Zn) MG capsule Take 1 capsule (220 mg total) by mouth daily. 01/15/20 02/14/20  Murlean Iba, MD    Physical Exam: Vitals:   01/19/20 1620 01/19/20 1700 01/19/20 1822 01/19/20 1904  BP: 129/69  123/79 123/79  Pulse: 84 81 83 86  Resp: 16  16 16   Temp:   98.7 F (37.1 C)   TempSrc:   Oral   SpO2: 98% 97% 93% 100%  Weight:        Constitutional: NAD, calm  Eyes: PERTLA, lids and conjunctivae normal ENMT: Mucous membranes are moist. Posterior pharynx clear of any exudate or lesions.   Neck: normal, supple, no masses, no  thyromegaly Respiratory:  no wheezing, no crackles. No accessory muscle use.  Cardiovascular: S1 & S2 heard, regular rate and rhythm. No extremity edema.   Abdomen: No distension, no tenderness, soft. Bowel sounds active.  Musculoskeletal: no clubbing / cyanosis. Status-post left BKA.   Skin: no significant rashes, lesions, ulcers. Left BKA stump cool and darkened. Neurologic: CN 2-12 grossly intact. Sensation intact. Moving all extremitie.  Psychiatric: Alert and oriented to person, place, and situation. Pleasant and cooperative.    Labs and Imaging on Admission: I have personally reviewed following labs and imaging studies  CBC: Recent Labs  Lab 01/13/20 0831 01/13/20 0831 01/14/20 0831 01/15/20 1013 01/16/20 0633 01/19/20 1513 01/19/20 1543  WBC 21.7*  --  21.3* 16.9* 21.8* 19.9*  --  NEUTROABS  --   --   --  12.1* 16.6*  --   --   HGB 9.8*   < > 9.5* 8.8* 8.9* 9.1* 9.2*  HCT 30.4*   < > 28.9* 26.5* 28.2* 28.9* 27.0*  MCV 83.3  --  82.8 83.6 82.9 86.5  --   PLT 597*  --  494* 422* 422* 566*  --    < > = values in this interval not displayed.   Basic Metabolic Panel: Recent Labs  Lab 01/13/20 0831 01/13/20 0831 01/14/20 0831 01/15/20 1013 01/16/20 0633 01/19/20 1513 01/19/20 1543  NA 129*   < > 131* 128* 130* 133* 134*  K 4.1   < > 4.1 4.0 4.2 4.5 4.3  CL 94*   < > 97* 95* 95* 98 100  CO2 23  --  24 23 24 23   --   GLUCOSE 459*   < > 167* 339* 191* 145* 145*  BUN 14   < > 11 14 14 16 16   CREATININE 0.68   < > 0.55* 0.60* 0.54* 0.58* 0.50*  CALCIUM 8.8*  --  8.8* 8.7* 9.0 8.8*  --   MG  --   --  1.7 1.7 2.1  --   --    < > = values in this interval not displayed.   GFR: Estimated Creatinine Clearance: 119.6 mL/min (A) (by C-G formula based on SCr of 0.5 mg/dL (L)). Liver Function Tests: Recent Labs  Lab 01/13/20 0831 01/15/20 1013 01/16/20 0633  AST 84* 60* 54*  ALT 62* 51* 59*  ALKPHOS 99 90 117  BILITOT 0.4 0.4 0.4  PROT 7.8 7.5 8.2*  ALBUMIN 2.7*  2.5* 2.6*   No results for input(s): LIPASE, AMYLASE in the last 168 hours. No results for input(s): AMMONIA in the last 168 hours. Coagulation Profile: No results for input(s): INR, PROTIME in the last 168 hours. Cardiac Enzymes: Recent Labs  Lab 01/13/20 0831 01/14/20 0831 01/15/20 1013 01/16/20 0633 01/19/20 1537  CKTOTAL 8,360* 6,248* 5,401* 3,828* 3,385*   BNP (last 3 results) No results for input(s): PROBNP in the last 8760 hours. HbA1C: No results for input(s): HGBA1C in the last 72 hours. CBG: Recent Labs  Lab 01/16/20 2119 01/17/20 0422 01/17/20 0746 01/17/20 1124 01/17/20 1624  GLUCAP 308* 156* 147* 281* 359*   Lipid Profile: No results for input(s): CHOL, HDL, LDLCALC, TRIG, CHOLHDL, LDLDIRECT in the last 72 hours. Thyroid Function Tests: No results for input(s): TSH, T4TOTAL, FREET4, T3FREE, THYROIDAB in the last 72 hours. Anemia Panel: No results for input(s): VITAMINB12, FOLATE, FERRITIN, TIBC, IRON, RETICCTPCT in the last 72 hours. Urine analysis:    Component Value Date/Time   COLORURINE YELLOW 01/08/2020 2029   APPEARANCEUR CLEAR 01/08/2020 2029   APPEARANCEUR Clear 08/22/2012 2000   LABSPEC 1.015 01/08/2020 2029   LABSPEC 1.003 08/22/2012 2000   PHURINE 5.0 01/08/2020 2029   GLUCOSEU NEGATIVE 01/08/2020 2029   GLUCOSEU >=500 08/22/2012 2000   HGBUR LARGE (A) 01/08/2020 2029   BILIRUBINUR NEGATIVE 01/08/2020 2029   BILIRUBINUR Negative 08/22/2012 2000   KETONESUR 5 (A) 01/08/2020 2029   PROTEINUR 30 (A) 01/08/2020 2029   NITRITE NEGATIVE 01/08/2020 2029   LEUKOCYTESUR NEGATIVE 01/08/2020 2029   LEUKOCYTESUR Negative 08/22/2012 2000   Sepsis Labs: @LABRCNTIP (procalcitonin:4,lacticidven:4) )No results found for this or any previous visit (from the past 240 hour(s)).   Radiological Exams on Admission: No results found.  Assessment/Plan   1. 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 Insulin-dependent DM  - A1c was 9.8% this month  - Continue CBG checks and insulin   3. Hypertension  - BP at goal  - Continue Norvasc as tolerated   4. Anemia  - Hgb 9.1 on admission, appears stable with no acute bleeding   - Monitor, transfuse as needed    5. Rhabdomyolysis  - CK 3385 on admission, continues to improve from peak 36,340 on 10/7   6. Recent COVID-19 infection  - Patient was diagnosed with COVID-19 on 10/7, had only mild respiratory symptoms which have continued to improve and no longer requires isolation or repeat testing     DVT prophylaxis: IV heparin  Code Status: Full  Family Communication: Discussed with patient  Disposition Plan:  Patient is from: Home  Anticipated d/c is to: TBD Anticipated d/c date is: Possibly as early as 01/20/20 Patient currently: Going for surgery  Consults called: Vascular surgery  Admission status: Observation     Vianne Bulls, MD [facility]  01/19/2020, 7:43 PM

## 2020-01-19 NOTE — ED Triage Notes (Signed)
EMS reports was called out for pain in left stump and buttocks.  Pt has left BKA.  Area is cool to touch and dark discoloration noted.  Pt says symptoms are not new, have been there for the past 2 years.  Pt also c/o pain in buttocks from pressure sore.  Home health's cbg was 132.  Pt alert and oriented at this time.

## 2020-01-19 NOTE — Progress Notes (Signed)
ANTICOAGULATION CONSULT NOTE - Initial Consult  Pharmacy Consult for heparin Indication: ischemic limb  No Known Allergies  Patient Measurements: Weight: 118.8 kg (262 lb) Heparin Dosing Weight: 87 kg  Vital Signs: Temp: 98.3 F (36.8 C) (10/18 1450) Temp Source: Oral (10/18 1450) BP: 109/61 (10/18 1450) Pulse Rate: 81 (10/18 1450)  Labs: No results for input(s): HGB, HCT, PLT, APTT, LABPROT, INR, HEPARINUNFRC, HEPRLOWMOCWT, CREATININE, CKTOTAL, CKMB, TROPONINIHS in the last 72 hours.  Estimated Creatinine Clearance: 119.6 mL/min (A) (by C-G formula based on SCr of 0.54 mg/dL (L)).   Medical History: Past Medical History:  Diagnosis Date  . Diabetes mellitus without complication ([place])   . GERD (gastroesophageal reflux disease)   . Gout   . Hyperlipidemia   . Hypertension   . Insomnia   . Osteoarthritis     Medications:  (Not in a hospital admission)   Assessment: Pharmacy consulted to dose heparin in patient with ischemic limb/possible DVT.   Goal of Therapy:  Heparin level 0.3-0.7 units/ml Monitor platelets by anticoagulation protocol: Yes   Plan:  Give 5000 units bolus x 1 Start heparin infusion at 1400 units/hr Check anti-Xa level in 6 hours and daily while on heparin Continue to monitor H&H and platelets  Margot Ables, PharmD Clinical Pharmacist 01/19/2020 3:11 PM

## 2020-01-19 NOTE — Anesthesia Preprocedure Evaluation (Signed)
Anesthesia Evaluation  Patient identified by MRN, date of birth, ID band Patient awake    Reviewed: Allergy & Precautions, NPO status , Patient's Chart, lab work & pertinent test results  History of Anesthesia Complications Negative for: history of anesthetic complications  Airway Mallampati: III  TM Distance: >3 FB Neck ROM: Full    Dental  (+) Chipped, Dental Advisory Given   Pulmonary neg shortness of breath, neg COPD, Recent URI , Resolved, former smoker,  Covid-19 Nucleic Acid Test Results Lab Results      Component                Value               Date                      SARSCOV2NAA              POSITIVE (A)        01/08/2020                [place]              NEGATIVE            11/25/2018              breath sounds clear to auscultation       Cardiovascular Exercise Tolerance: Poor hypertension, Pt. on medications and Pt. on home beta blockers (-) angina+ Peripheral Vascular Disease  (-) Past MI (-) dysrhythmias  Rhythm:Regular     Neuro/Psych neg Seizures  Neuromuscular disease negative psych ROS   GI/Hepatic Neg liver ROS, GERD  ,  Endo/Other  diabetes, Insulin Dependent  Renal/GU negative Renal ROS     Musculoskeletal  (+) Arthritis ,   Abdominal   Peds  Hematology  (+) Blood dyscrasia, anemia , Lab Results      Component                Value               Date                      WBC                      19.9 (H)            01/19/2020                HGB                      9.2 (L)             01/19/2020                HCT                      27.0 (L)            01/19/2020                MCV                      86.5                01/19/2020                PLT  566 (H)             01/19/2020              Anesthesia Other Findings   Reproductive/Obstetrics                             Anesthesia Physical Anesthesia Plan  ASA:  III  Anesthesia Plan: General   Post-op Pain Management:    Induction: Intravenous  PONV Risk Score and Plan: 2 and Ondansetron, Treatment may vary due to age or medical condition and Metaclopromide  Airway Management Planned: Oral ETT  Additional Equipment: None  Intra-op Plan:   Post-operative Plan: Extubation in OR  Informed Consent: I have reviewed the patients History and Physical, chart, labs and discussed the procedure including the risks, benefits and alternatives for the proposed anesthesia with the patient or authorized representative who has indicated his/her understanding and acceptance.     Dental advisory given  Plan Discussed with: CRNA and Surgeon  Anesthesia Plan Comments:         Anesthesia Quick Evaluation

## 2020-01-19 NOTE — ED Provider Notes (Signed)
[facility] Provider Note   CSN: [id number] Arrival date & time: 01/19/20  1440     History Chief Complaint  Patient presents with  . Leg Pain    [name] is a 59 y.o. male with a past medical history of diabetes, hyperlipidemia, hypertension, recent history of COVID-19, recent admission for rhabdomyolysis who presents emergency department with chief complaint of severe left leg pain patient states that his leg has been hurting for almost 2 years however over the past 2 days it has acutely worsened.  Complains that it is severely painful he has been taking Motrin without relief.  He lives by himself but has home health aides. He also complains of pain in the his buttocks.  Denies fevers or chills. HPI     Past Medical History:  Diagnosis Date  . Diabetes mellitus without complication ([place])   . GERD (gastroesophageal reflux disease)   . Gout   . Hyperlipidemia   . Hypertension   . Insomnia   . Osteoarthritis     Patient Active Problem List   Diagnosis Date Noted  . Altered mental status 01/09/2020  . Hyperkalemia 01/09/2020  . Transaminitis 01/09/2020  . Dehydration 01/09/2020  . COVID-19 virus infection 01/09/2020  . Leukocytosis 01/09/2020  . GERD (gastroesophageal reflux disease) 01/09/2020  . Recurrent falls 01/09/2020  . Obesity (BMI 30-39.9) 01/09/2020  . Prolonged QT interval 01/09/2020  . Hyperglycemia due to diabetes mellitus ([place]) 01/09/2020  . Rhabdomyolysis 01/08/2020  . Encounter for screening colonoscopy 05/27/2018  . Vitamin D deficiency 02/06/2018  . Mixed hyperlipidemia 12/12/2017  . S/P BKA (below knee amputation) unilateral, left ([place]) 11/29/2017  . Unilateral complete BKA, right, sequela ([place])   . Thrombocytosis   . Constipation due to pain medication   . Below knee amputation status, left   . Unilateral complete BKA, left, initial encounter ([place])   . Acute blood loss anemia   . Post-operative pain   .  Essential hypertension, benign   . Tobacco abuse   . S/P amputation   . Subacute osteomyelitis, left ankle and foot ([place])   . Acute osteomyelitis of left calcaneus ([facility])   . Cutaneous abscess of left foot   . Severe protein-calorie malnutrition ([place])   . PAD (peripheral artery disease) ([place])   . Wound infection 10/02/2017  . Type 2 diabetes mellitus treated with insulin ([place])   . Tobacco use disorder   . Lymphedema 05/28/2017  . Essential hypertension 05/28/2017  . DM type 2 causing vascular disease ([place]) 05/28/2017  . DJD (degenerative joint disease) 05/28/2017  . Hyponatremia 04/05/2016  . Anemia 04/05/2016  . Gout 04/05/2016  . Bilateral foot pain 04/04/2016    Past Surgical History:  Procedure Laterality Date  . AMPUTATION Left 10/05/2017   Procedure: LEFT BELOW KNEE AMPUTATION;  Surgeon: [name], MD;  Location: [facility];  Service: Orthopedics;  Laterality: Left;  . COLONOSCOPY WITH PROPOFOL N/A 11/28/2018   Procedure: COLONOSCOPY WITH PROPOFOL;  Surgeon: Daneil Dolin, MD;  [facility];  Service: Endoscopy;  Laterality: N/A;  7:30am  . JOINT REPLACEMENT     right knee  . LEG SURGERY    . POLYPECTOMY  11/28/2018   Procedure: POLYPECTOMY;  Surgeon: Daneil Dolin, MD;  [facility];  Service: Endoscopy;;  sigmoid polyp x 1, ascending colon x 5       Family History  Problem Relation Age of Onset  . Diabetes Mother   . Colon cancer  Maternal Uncle        greater than age 76  . Cancer Other        multiple family memebers on mother's side of family.     Social History   Tobacco Use  . Smoking status: Former Smoker    Packs/day: 1.00    Years: 50.00    Pack years: 50.00    Types: Cigarettes    Quit date: 10/01/2017    Years since quitting: 2.3  . Smokeless tobacco: Never Used  Vaping Use  . Vaping Use: Never used  Substance Use Topics  . 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 Drug use: No     Home Medications Prior to Admission medications   Medication Sig Start Date End Date Taking? Authorizing Provider  acetaminophen (TYLENOL) 325 MG tablet Take 2 tablets (650 mg total) by mouth every 6 (six) hours. 10/10/17   Love, Ivan Anchors, PA-C  amitriptyline (ELAVIL) 25 MG tablet Take 25 mg by mouth at bedtime.    [provider]  amLODipine (NORVASC) 10 MG tablet Take 1 tablet (10 mg total) by mouth daily. 10/18/17   Love, Ivan Anchors, PA-C  ascorbic acid (VITAMIN C) 500 MG tablet Take 1 tablet (500 mg total) by mouth daily. 01/15/20 02/14/20  Murlean Iba, MD  Cholecalciferol (VITAMIN D) 125 MCG (5000 UT) CAPS TAKE (1) CAPSULE BY MOUTH ONCE DAILY. 10/23/19   Cassandria Anger, MD  glipiZIDE (GLUCOTROL XL) 10 MG 24 hr tablet Take 1 tablet (10 mg total) by mouth daily. 11/10/19   Cassandria Anger, MD  glucose blood (ACCU-CHEK AVIVA PLUS) test strip CHECK BLOOD SUGAR 4 TIMES DAILY. 08/07/19   Cassandria Anger, MD  insulin aspart (NOVOLOG FLEXPEN) 100 UNIT/ML FlexPen Inject 24-30 Units into the skin 3 (three) times daily with meals. 11/10/19   Cassandria Anger, MD  insulin glargine (LANTUS SOLOSTAR) 100 UNIT/ML Solostar Pen qhs 11/10/19   Nida, Marella Chimes, MD  Insulin Pen Needle (ULTICARE MINI PEN NEEDLES) 31G X 6 MM MISC USE AS DIRECTED 08/07/19   Nida, Marella Chimes, MD  metFORMIN (GLUCOPHAGE-XR) 500 MG 24 hr tablet TAKE 1 TABLET BY MOUTH TWICE DAILY AFTER A MEAL Patient taking differently: Take 500 mg by mouth 2 (two) times daily. After meals 09/23/19   Cassandria Anger, MD  metoprolol succinate (TOPROL-XL) 25 MG 24 hr tablet Take 1 tablet (25 mg total) by mouth daily. 10/18/17   Love, Ivan Anchors, PA-C  pantoprazole (PROTONIX) 40 MG tablet Take 1 tablet (40 mg total) by mouth daily. 10/18/17   Love, Ivan Anchors, PA-C  polyethylene glycol (MIRALAX / GLYCOLAX) packet Take 17 g by mouth daily. Patient taking differently: Take 17 g by mouth daily as needed for moderate  constipation.  10/18/17   Love, Ivan Anchors, PA-C  rosuvastatin (CRESTOR) 10 MG tablet Take 1 tablet (10 mg total) by mouth daily. 01/21/20   Johnson, Clanford L, MD  zinc sulfate 220 (50 Zn) MG capsule Take 1 capsule (220 mg total) by mouth daily. 01/15/20 02/14/20  Murlean Iba, MD    Allergies    Patient has no known allergies.  Review of Systems   Review of Systems Ten systems reviewed and are negative for acute change, except as noted in the HPI.   Physical Exam Updated Vital Signs BP 109/61 (BP Location: Left Arm)   Pulse 81   Temp 98.3 F (36.8 C) (Oral)  Resp 18   Wt 118.8 kg   SpO2 98%   BMI 41.04 kg/m   Physical Exam Vitals and nursing note reviewed.  Constitutional:      General: He is not in acute distress.    Appearance: He is well-developed. He is not diaphoretic.  HENT:     Head: Normocephalic and atraumatic.  Eyes:     General: No scleral icterus.    Conjunctiva/sclera: Conjunctivae normal.  Cardiovascular:     Rate and Rhythm: Normal rate and regular rhythm.     Heart sounds: Normal heart sounds.     Comments: Left BKA is cold to the touch from just above the knee down to the stump.  It is dark and discolored.  No palpable popliteal or femoral pulse.  There is a monophasic femoral pulse on Doppler.  I am unable to Doppler popliteal pulses. Pulmonary:     Effort: Pulmonary effort is normal. No respiratory distress.     Breath sounds: Normal breath sounds.  Abdominal:     Palpations: Abdomen is soft.     Tenderness: There is no abdominal tenderness.  Musculoskeletal:     Cervical back: Normal range of motion and neck supple.     Comments: Stage II pressure ulcer on the left gluteal cleft.  No evidence of active infection  Skin:    General: Skin is warm and dry.  Neurological:     Mental Status: He is alert.  Psychiatric:        Behavior: Behavior normal.     ED Results / Procedures / Treatments   Labs (all labs ordered are listed, but only  abnormal results are displayed) Labs Reviewed  CBC  BASIC METABOLIC PANEL  HEPARIN LEVEL (UNFRACTIONATED)  I-STAT CHEM 8, ED    EKG None  Radiology No results found.  Procedures .Critical Care Performed by: Margarita Mail, PA-C Authorized by: Margarita Mail, PA-C   Critical care provider statement:    Critical care time (minutes):  30   Critical care time was exclusive of:  Separately billable procedures and treating other patients   Critical care was necessary to treat or prevent imminent or life-threatening deterioration of the following conditions: Ischemic limb.   Critical care was time spent personally by me on the following activities:  Discussions with consultants, evaluation of patient's response to treatment, examination of patient, ordering and performing treatments and interventions, ordering and review of laboratory studies, ordering and review of radiographic studies, pulse oximetry, re-evaluation of patient's condition, obtaining history from patient or surrogate and review of old charts   (including critical care time)  Medications Ordered in ED Medications  heparin bolus via infusion 5,000 Units (has no administration in time range)  heparin ADULT infusion 100 units/mL (25000 units/284mL sodium chloride 0.45%) (has no administration in time range)  HYDROmorphone (DILAUDID) injection 0.5 mg (0.5 mg Intravenous Given 01/19/20 1512)  ondansetron (ZOFRAN) injection 4 mg (4 mg Intravenous Given 01/19/20 1511)    ED Course  I have reviewed the triage vital signs and the nursing notes.  Pertinent labs & imaging results that were available during my care of the patient were reviewed by me and considered in my medical decision making (see chart for details).  Clinical Course as of Jan 18 1621  Mon Jan 19, 2020  1601 Case discussed with Dr. Oneida Alar- Will tranfer to [facility] for emergent evaluation.  Dr. Gara Kroner has accepted the patient.   [AH]    Clinical Course  User Index [  AH] Margarita Mail, PA-C   MDM Rules/Calculators/A&P                          Patient with concern for acute ischemic limb.  I have ordered heparin and a CT aortobifem with runoff.  Pain medication ordered. 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 Oneida Alar.  Will do an ER to ER transfer for ischemic limb with unknown acuity although the patient does state that his leg is newly cold and severely painful which makes me concerned that he has had acute worsening in this known thrombus.  I have started the patient on heparin and he will be transferred emergently.  Rx / DC Orders ED Discharge Orders    None       Margarita Mail, PA-C 01/19/20 1626    Sherwood Gambler, MD 01/19/20 [phone]
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
## 2020-01-19 NOTE — ED Notes (Signed)
No answer for Pilar Grammes.  Informed Melissa (daughter-in-law) of pts transfer and she will inform pts son.

## 2020-01-19 NOTE — Op Note (Signed)
Procedure: Left iliofemoral embolectomy  Preoperative diagnosis: Acute ischemia left leg  Postoperative diagnosis: Same  Anesthesia: General  Assistant: Paul Half, PA for assistance in retraction creation of anastomosis  Operative findings: 1.  Acute thrombotic material left iliofemoral region  Thrombus sent to pathology  Operative details: After obtaining form consent, the patient taken the operating.  The patient was placed supine position operating table.  After induction general anesthesia endotracheal intubation patient had a Foley catheter placed.  Next patient was prepped and draped in usual sterile fashion from the umbilicus down to the end of his left below-knee amputation stump.  Longitudinal incision was made left groin carried down through subcutaneous tissues down the level left common femoral artery.  There was no pulse within it.  It was mildly calcified primarily on the medial posterior wall.  It was dissected free circumferentially and a vessel loop placed around it.  Superficial femoral profunda femoris arteries were dissected free circumferentially and Vesseloops placed around these.  Again the superficial femoral artery was slightly calcified on the posterior wall but soft anteriorly.  The profunda was also soft in character.  The patient was given 10,000 units of intravenous heparin.  Heparin drip was discontinued.  Transverse arteriotomy was made in the artery just above the femoral bifurcation.  #4 #5 Fogarty catheter was used to thrombectomize proximal aspect of the artery.  These were passed all the way up above the level of what would be the aortic bifurcation.  A large amount of thrombus was retrieved.  A large percentage of this was sent to pathology as a specimen.  Brisk arterial inflow was obtained.  The artery was occluded proximally with a vessel loop.  I then proceeded past 4 Fogarty down the profunda and superficial femoral arteries.  On the superficial femoral  artery the catheter only passed for about 10 cm.  This was similar to the profunda.  There was minimal SFA backbleeding.  There was fairly good backbleeding from the profunda.  Both these were thoroughly irrigated normal saline solution.  Next the arteriotomy was repaired using interrupted 6-0 Prolene sutures.  Despite completion anastomosis and I released proximal control and there was minimal flow coming down the artery.  Therefore prior to completely closing the artery a #5 Fogarty catheter was passed multiple times back up the iliac system and more thrombotic material was retrieved.  There was again brisk arterial inflow.  The artery was reoccluded everything was again backbled and still looked good.  The arteriotomy was then closed with interrupted 6-0 Prolene sutures.  This prior to completion anastomosis it was for blood backbled and thoroughly flushed.  Estimates was secured clamps released was pulsatile flow in the common femoral artery immediately.  There was good Doppler flow in the origin of the superficial femoral profunda femoris arteries.  Hemostasis was obtained with direct pressure.  The groin was then closed in multiple layers running 2-0 and 3-0 Vicryl suture 4 0 Vicryl subcuticular stitch in the skin.  Dermabond was applied.  Patient tolerated procedure well and there were no complications.  Foley catheter was removed at conclusion of the case.  Patient was awakened in the operating room taken recovery in stable condition.  Instrument sponge and needle count was correct end of the case.  [name], MD [facility] [place] Office: [phone]

## 2020-01-19 NOTE — ED Notes (Signed)
Dr. Oneida Alar paged to [phone] Dr. Laverta Baltimore paged by Levada Dy

## 2020-01-19 NOTE — Progress Notes (Signed)
[place] for heparin Indication: ischemic limb  Assessment: Pharmacy consulted to dose heparin in patient with ischemic limb/possible DVT. S/p Left iliofemoral embolectomy, heparin to resume   Goal of Therapy:  Heparin level 0.3-0.7 units/ml Monitor platelets by anticoagulation protocol: Yes   Plan:  Heparin drip at 1400 units/hr Check heparin level at 0800 in am  Excell Seltzer, PharmD Clinical Pharmacist 01/19/2020 11:52 PM

## 2020-01-19 NOTE — Transfer of Care (Signed)
Immediate Anesthesia Transfer of Care Note  Patient: [name]  Procedure(s) Performed: ENDARTERECTOMY Ilio/FEMORAL (Left Groin)  Patient Location: PACU  Anesthesia Type:General  Level of Consciousness: awake, patient cooperative and responds to stimulation  Airway & Oxygen Therapy: Patient Spontanous Breathing and Patient connected to nasal cannula oxygen  Post-op Assessment: Report given to RN and Post -op Vital signs reviewed and stable  Post vital signs: Reviewed and stable  Last Vitals:  Vitals Value Taken Time  BP 133/65 01/19/20 2210  Temp    Pulse 85 01/19/20 2211  Resp 14 01/19/20 2211  SpO2 94 % 01/19/20 2211  Vitals shown include unvalidated device data.  Last Pain:  Vitals:   01/19/20 1904  TempSrc:   PainSc: 7          Complications: No complications documented.

## 2020-01-19 NOTE — Consult Note (Signed)
Called by Forestine Na, ER regarding patient with ischemic appearing left below-knee amputation stump.  He had a CT scan abdomen and pelvis performed 11 days ago which showed occlusion of the left common iliac artery.  It is difficult to know without knowing the patient's history and exam findings whether or not this is chronic.  On review of the patient's recent hospital admission he was discharged approximately 48 hours ago.  There was no mention over the last 48 hours of any complaints in his left below-knee amputation stump.  I discussed the CT findings and patient's overall clinical picture with the PA at [facility].  The patient will be transferred to the [facility] ER for further evaluation.  A determination will need to be made whether or not this is acute or chronic process and whether or not he would be admitted to the hospitalist service light of his multiple medical problems including recent Covid diagnosis.  [name], MD [facility] [place] Office: [phone]

## 2020-01-19 NOTE — ED Notes (Signed)
Patient taken to OR.

## 2020-01-19 NOTE — ED Provider Notes (Signed)
Blood pressure 129/69, pulse 81, temperature 98.3 F (36.8 C), temperature source Oral, resp. rate 16, weight 118.8 kg, SpO2 97 %.  In short, [name] is a 59 y.o. male with a chief complaint of Leg Pain .  Refer to the original H&P for additional details.   06:14 PM  Patient arrives to the emergency department with [facility].  He is awake and alert.  He is here for vascular surgery evaluation.  He has had worsening pain and the lower extremity is cool to touch.  There is concern for acute ischemia but there was blockage seen on recent CT during his recent admission with COVID PNA. No AMS here. No respiratory distress. Will page vascular surgery to make them aware that the patient is here.   07:00 PM  Dr. Oneida Alar is seen the patient and plans to take the patient to the operating room.  He is requesting hospitalist admission with Covid and complicated medical history.  He states from his standpoint he may be able to discharge him tomorrow pending the results of surgery this evening.   Discussed patient's case with [facility], Dr. Myna Hidalgo to request admission. Patient and family (if present) updated with plan. Care transferred to [facility] service.  I reviewed all nursing notes, vitals, pertinent old records, EKGs, labs, imaging (as available).  CRITICAL CARE Performed by: Margette Fast Total critical care time: 35 minutes Critical care time was exclusive of separately billable procedures and treating other patients. Critical care was necessary to treat or prevent imminent or life-threatening deterioration. Critical care was time spent personally by me on the following activities: development of treatment plan with patient and/or surrogate as well as nursing, discussions with consultants, evaluation of patient's response to treatment, examination of patient, obtaining history from patient or surrogate, ordering and performing treatments and interventions, ordering and review of laboratory studies,  ordering and review of radiographic studies, pulse oximetry and re-evaluation of patient's condition.  Nathan Quinton, MD Emergency Medicine     [name], Nathan Olds, MD 01/19/20 [name]

## 2020-01-20 ENCOUNTER — Observation Stay (HOSPITAL_COMMUNITY): Payer: Medicaid Other

## 2020-01-20 ENCOUNTER — Encounter (HOSPITAL_COMMUNITY): Payer: Self-pay | Admitting: Vascular Surgery

## 2020-01-20 DIAGNOSIS — K219 Gastro-esophageal reflux disease without esophagitis: Secondary | ICD-10-CM | POA: Diagnosis present

## 2020-01-20 DIAGNOSIS — D509 Iron deficiency anemia, unspecified: Secondary | ICD-10-CM | POA: Diagnosis present

## 2020-01-20 DIAGNOSIS — B952 Enterococcus as the cause of diseases classified elsewhere: Secondary | ICD-10-CM | POA: Diagnosis not present

## 2020-01-20 DIAGNOSIS — K5900 Constipation, unspecified: Secondary | ICD-10-CM | POA: Diagnosis not present

## 2020-01-20 DIAGNOSIS — M6282 Rhabdomyolysis: Secondary | ICD-10-CM | POA: Diagnosis present

## 2020-01-20 DIAGNOSIS — E871 Hypo-osmolality and hyponatremia: Secondary | ICD-10-CM | POA: Diagnosis present

## 2020-01-20 DIAGNOSIS — E1165 Type 2 diabetes mellitus with hyperglycemia: Secondary | ICD-10-CM | POA: Diagnosis present

## 2020-01-20 DIAGNOSIS — I70202 Unspecified atherosclerosis of native arteries of extremities, left leg: Secondary | ICD-10-CM | POA: Diagnosis present

## 2020-01-20 DIAGNOSIS — E782 Mixed hyperlipidemia: Secondary | ICD-10-CM | POA: Diagnosis present

## 2020-01-20 DIAGNOSIS — I998 Other disorder of circulatory system: Secondary | ICD-10-CM | POA: Diagnosis present

## 2020-01-20 DIAGNOSIS — Z89512 Acquired absence of left leg below knee: Secondary | ICD-10-CM | POA: Diagnosis not present

## 2020-01-20 DIAGNOSIS — Z8616 Personal history of COVID-19: Secondary | ICD-10-CM | POA: Diagnosis not present

## 2020-01-20 DIAGNOSIS — D75839 Thrombocytosis, unspecified: Secondary | ICD-10-CM | POA: Diagnosis present

## 2020-01-20 DIAGNOSIS — I10 Essential (primary) hypertension: Secondary | ICD-10-CM | POA: Diagnosis present

## 2020-01-20 DIAGNOSIS — T8141XA Infection following a procedure, superficial incisional surgical site, initial encounter: Secondary | ICD-10-CM | POA: Diagnosis not present

## 2020-01-20 DIAGNOSIS — D638 Anemia in other chronic diseases classified elsewhere: Secondary | ICD-10-CM | POA: Diagnosis present

## 2020-01-20 DIAGNOSIS — T8789 Other complications of amputation stump: Secondary | ICD-10-CM | POA: Diagnosis present

## 2020-01-20 DIAGNOSIS — D62 Acute posthemorrhagic anemia: Secondary | ICD-10-CM | POA: Diagnosis not present

## 2020-01-20 DIAGNOSIS — Z0189 Encounter for other specified special examinations: Secondary | ICD-10-CM

## 2020-01-20 DIAGNOSIS — L89322 Pressure ulcer of left buttock, stage 2: Secondary | ICD-10-CM | POA: Diagnosis present

## 2020-01-20 DIAGNOSIS — Y835 Amputation of limb(s) as the cause of abnormal reaction of the patient, or of later complication, without mention of misadventure at the time of the procedure: Secondary | ICD-10-CM | POA: Diagnosis present

## 2020-01-20 DIAGNOSIS — E1159 Type 2 diabetes mellitus with other circulatory complications: Secondary | ICD-10-CM | POA: Diagnosis present

## 2020-01-20 DIAGNOSIS — E1151 Type 2 diabetes mellitus with diabetic peripheral angiopathy without gangrene: Secondary | ICD-10-CM | POA: Diagnosis present

## 2020-01-20 DIAGNOSIS — Y838 Other surgical procedures as the cause of abnormal reaction of the patient, or of later complication, without mention of misadventure at the time of the procedure: Secondary | ICD-10-CM | POA: Diagnosis not present

## 2020-01-20 DIAGNOSIS — R748 Abnormal levels of other serum enzymes: Secondary | ICD-10-CM | POA: Diagnosis present

## 2020-01-20 DIAGNOSIS — D649 Anemia, unspecified: Secondary | ICD-10-CM | POA: Diagnosis not present

## 2020-01-20 DIAGNOSIS — I745 Embolism and thrombosis of iliac artery: Secondary | ICD-10-CM | POA: Diagnosis present

## 2020-01-20 DIAGNOSIS — G8929 Other chronic pain: Secondary | ICD-10-CM | POA: Diagnosis present

## 2020-01-20 LAB — GLUCOSE, CAPILLARY
Glucose-Capillary: 230 mg/dL — ABNORMAL HIGH (ref 70–99)
Glucose-Capillary: 125 mg/dL — ABNORMAL HIGH (ref 70–99)
Glucose-Capillary: 208 mg/dL — ABNORMAL HIGH (ref 70–99)
Glucose-Capillary: 210 mg/dL — ABNORMAL HIGH (ref 70–99)
Glucose-Capillary: 127 mg/dL — ABNORMAL HIGH (ref 70–99)

## 2020-01-20 LAB — ECHOCARDIOGRAM COMPLETE
Area-P 1/2: 3.27 cm2
S' Lateral: 4.79 cm
Weight: 4192 oz

## 2020-01-20 LAB — BASIC METABOLIC PANEL
Anion gap: 8 (ref 5–15)
BUN: 12 mg/dL (ref 8–23)
CO2: 24 mmol/L (ref 22–32)
Calcium: 8.4 mg/dL — ABNORMAL LOW (ref 8.9–10.3)
Chloride: 99 mmol/L (ref 98–111)
Creatinine, Ser: 0.64 mg/dL (ref 0.61–1.24)
GFR, Estimated: >60 mL/min (ref >60)
Glucose, Bld: 147 mg/dL — ABNORMAL HIGH (ref 70–99)
Potassium: 3.9 mmol/L (ref 3.5–5.1)
Sodium: 131 mmol/L — ABNORMAL LOW (ref 135–145)

## 2020-01-20 LAB — CBC
HCT: 26.2 % — ABNORMAL LOW (ref 39.0–52.0)
Hemoglobin: 8.3 g/dL — ABNORMAL LOW (ref 13.0–17.0)
MCH: 26.4 pg (ref 26.0–34.0)
MCHC: 31.7 g/dL (ref 30.0–36.0)
MCV: 83.4 fL (ref 80.0–100.0)
Platelets: 493 10*3/uL — ABNORMAL HIGH (ref 150–400)
RBC: 3.14 MIL/uL — ABNORMAL LOW (ref 4.22–5.81)
RDW: 14.2 % (ref 11.5–15.5)
WBC: 16.1 10*3/uL — ABNORMAL HIGH (ref 4.0–10.5)
nRBC: 0 % (ref 0.0–0.2)

## 2020-01-20 LAB — HEPARIN LEVEL (UNFRACTIONATED)
Heparin Unfractionated: <0.1 IU/mL — ABNORMAL LOW (ref 0.30–0.70)
Heparin Unfractionated: 0.15 IU/mL — ABNORMAL LOW (ref 0.30–0.70)

## 2020-01-20 LAB — LIPID PANEL
Cholesterol: 89 mg/dL (ref 0–200)
HDL: 30 mg/dL — ABNORMAL LOW (ref >40)
LDL Cholesterol: 44 mg/dL (ref 0–99)
Total CHOL/HDL Ratio: 3 RATIO
Triglycerides: 76 mg/dL (ref <150)
VLDL: 15 mg/dL (ref 0–40)

## 2020-01-20 LAB — TYPE AND SCREEN
ABO/RH(D): O POS
Antibody Screen: NEGATIVE

## 2020-01-20 MED ORDER — HEPARIN BOLUS VIA INFUSION
3000.0000 [IU] | Freq: Once | INTRAVENOUS | Status: AC
  Administered 2020-01-20: 3000 [IU] via INTRAVENOUS
  Filled 2020-01-20: qty 3000

## 2020-01-20 MED ORDER — INSULIN GLARGINE 100 UNIT/ML ~~LOC~~ SOLN
20.0000 [IU] | Freq: Every day | SUBCUTANEOUS | Status: DC
  Administered 2020-01-20 – 2020-01-21 (×2): 20 [IU] via SUBCUTANEOUS
  Filled 2020-01-20 (×3): qty 0.2

## 2020-01-20 NOTE — Evaluation (Signed)
Physical Therapy Evaluation Patient Details Name: [name] MRN: [id number] DOB: 01-02-1961 Today's Date: 01/20/2020   History of Present Illness  [name] is a 59 y.o. male with medical history significant for diabetes, GERD, gout, hyperlipidemia, hypertension, osteoarthritis and left BKA who recently presented to the emergency department at [facility] due to  altered mental status. Patient was unable to provide detailed history on why he came to the emergency department, he states that he came due to high blood glucose level. Patient also states that he fell a couple of times when he tried to transfer from his wheelchair to another chair. Most of the history was obtained from ED PA and ED medical chart. Per report, patient symptoms started about 2 weeks ago with a change in behavior, he lives alone and was able to take care of his ADLs including cooking his own food, but he has not been doing this recently. Patient's son checked on him  and found him naked on the floor, patient stated that he laid there all night per medical record (unusual behavior at baseline), he was taken to his PCP where he had a negative strep screen and pending labs and urinalysis. Patient was found on the floor 2 more times when checked upon by son. His medications were found in unusual places in the house and is unknown if patient has been compliant with his medication (different from baseline where patient usually takes his medication). His urine was noted to be dark and foul-smelling, he was then brought to the ED 10/14 for further evaluation and management. Pt d/c'd to  home with [facility] care on 10/16.  Returned to  [facility] 10/18 with cold left LE.  Pt underwent  left ileofemoral embolectomy  on 10/18.    Clinical Impression  Pt admitted with above diagnosis. Pt was able to scoot pivot with min guard to min assist to recliner.  Pt needed cues for sequencing and had a little pain with movement.  Overall did well  and should progress well.  Will need sNF for rehab as pt has been struggling at home.   Pt currently with functional limitations due to the deficits listed below (see PT Problem List). Pt will benefit from skilled PT to increase their independence and safety with mobility to allow discharge to the venue listed below.      Follow Up Recommendations SNF    Equipment Recommendations  None recommended by PT    Recommendations for Other Services       Precautions / Restrictions Precautions Precautions: Fall Restrictions Weight Bearing Restrictions: No      Mobility  Bed Mobility Overal bed mobility: Needs Assistance Bed Mobility: Supine to Sit     Supine to sit: Min assist     General bed mobility comments: increased time; min assist due to pt in pain in groin  Transfers Overall transfer level: Needs assistance Equipment used: None Transfers: Lateral/Scoot Transfers          Lateral/Scoot Transfers: Min guard;Min assist;From elevated surface General transfer comment: Pt able to scoot to drop arm relcliner with min to min guard assist and cues for sequencing movement.   Ambulation/Gait                Stairs            Wheelchair Mobility    Modified Rankin (Stroke Patients Only)       Balance Overall balance assessment: Needs assistance Sitting-balance support: Feet supported;No upper extremity supported Sitting  balance-Leahy Scale: Good Sitting balance - Comments: seated at EOB                                     Pertinent Vitals/Pain Pain Assessment: Faces Faces Pain Scale: Hurts even more Pain Location: surgical site Pain Descriptors / Indicators: Discomfort;Grimacing;Guarding;Sore Pain Intervention(s): Limited activity within patient's tolerance;Monitored during session;Repositioned;Premedicated before session    Nathan expects to be discharged to:: Private residence Living Arrangements: Alone Available  Help at Discharge: Family;Available PRN/intermittently Type of Home: Mobile home Home Access: Ramped entrance     Home Layout: One level Home Equipment: Plummer - 2 wheels;Cane - single point;Bedside commode;Shower seat;Wheelchair - manual      Prior Function Level of Independence: Independent with assistive device(s)         Comments: Community ambulator using LLE BKA prosthetic leg, drives per chart.  Pt states he was using wheelchair a lot more lately.      Hand Dominance   Dominant Hand: Right    Extremity/Trunk Assessment   Upper Extremity Assessment Upper Extremity Assessment: Defer to OT evaluation    Lower Extremity Assessment Lower Extremity Assessment: Generalized weakness;LLE deficits/detail LLE: Unable to fully assess due to pain    Cervical / Trunk Assessment Cervical / Trunk Assessment: Normal  Communication   Communication: No difficulties  Cognition Arousal/Alertness: Awake/alert Behavior During Therapy: WFL for tasks assessed/performed Overall Cognitive Status: Within Functional Limits for tasks assessed                                        General Comments      Exercises General Exercises - Lower Extremity Ankle Circles/Pumps: Seated;AROM;Strengthening;Right;10 reps Long Arc Quad: Seated;AROM;Strengthening;Both;10 reps Hip Flexion/Marching: Seated;AROM;Strengthening;10 reps;Both   Assessment/Plan    PT Assessment Patient needs continued PT services  PT Problem List Decreased strength;Decreased activity tolerance;Decreased balance;Decreased mobility       PT Treatment Interventions DME instruction;Gait training;Stair training;Functional mobility training;Therapeutic activities;Therapeutic exercise;Balance training;Patient/family education    PT Goals (Current goals can be found in the Care Plan section)  Acute Rehab PT Goals Patient Stated Goal: return home after rehab PT Goal Formulation: With patient Time For Goal  Achievement: 02/03/20 Potential to Achieve Goals: Good    Frequency Min 2X/week   Barriers to discharge        Co-evaluation               AM-PAC PT "6 Clicks" Mobility  Outcome Measure Help needed turning from your back to your side while in a flat bed without using bedrails?: None Help needed moving from lying on your back to sitting on the side of a flat bed without using bedrails?: A Little Help needed moving to and from a bed to a chair (including a wheelchair)?: A Lot Help needed standing up from a chair using your arms (e.g., wheelchair or bedside chair)?: Total Help needed to walk in hospital room?: Total Help needed climbing 3-5 steps with a railing? : Total 6 Click Score: 12    End of Session Equipment Utilized During Treatment: Gait belt Activity Tolerance: Patient tolerated treatment well;Patient limited by fatigue;Patient limited by pain Patient left: in chair;with call bell/phone within reach;with chair alarm set Nurse Communication: Mobility status PT Visit Diagnosis: Unsteadiness on feet (R26.81);Other abnormalities of gait and mobility (R26.89);Muscle weakness (  generalized) (M62.81);History of falling (Z91.81)    Time: 2773-7505 PT Time Calculation (min) (ACUTE ONLY): 23 min   Charges:   PT Evaluation $PT Eval Moderate Complexity: 1 Mod PT Treatments $Therapeutic Activity: 8-22 mins        Mathis Cashman W,PT Acute Rehabilitation Services Pager:  [phone]  Office:  [place] 01/20/2020, 12:06 PM

## 2020-01-20 NOTE — Progress Notes (Signed)
ANTICOAGULATION CONSULT NOTE - Initial Consult  Pharmacy Consult for heparin Indication: ischemic limb  No Known Allergies  Patient Measurements: Weight: 118.8 kg (262 lb) Heparin Dosing Weight: 93kg  Vital Signs: Temp: 97.6 F (36.4 C) (10/19 1102) Temp Source: Oral (10/19 1102) BP: 110/65 (10/19 1102) Pulse Rate: 82 (10/19 1102)  Labs: Recent Labs    01/19/20 1513 01/19/20 1513 01/19/20 1537 01/19/20 1543 01/19/20 2352 01/20/20 0820  HGB 9.1*   < >  --  9.2* 8.3*  --   HCT 28.9*  --   --  27.0* 26.2*  --   PLT 566*  --   --   --  493*  --   HEPARINUNFRC  --   --   --   --   --  <0.10*  CREATININE 0.58*  --   --  0.50* 0.64  --   CKTOTAL  --   --  3,385*  --   --   --    < > = values in this interval not displayed.    Estimated Creatinine Clearance: 119.6 mL/min (by C-G formula based on SCr of 0.64 mg/dL).   Medical History: Past Medical History:  Diagnosis Date  . Diabetes mellitus without complication ([place])   . GERD (gastroesophageal reflux disease)   . Gout   . Hyperlipidemia   . Hypertension   . Insomnia   . Osteoarthritis      Assessment: 59 yo M with hx L BKA admitted for ischemic LLE and thrombus s/p embolectomy 10/18. Pharmacy is consulted for heparin. Patient may require AKA if perfusion does not improve.   Patient received 10,000 units IV heparin during procedure. Heparin resumed at previous rate of 1400 units/hr. First HL undetectable. Patient does not remember where it was drawn from. No signs of bleeding. Will give small bolus and increase.  Goal of Therapy:  Heparin level 0.3-0.7 units/ml Monitor platelets by anticoagulation protocol: Yes   Plan:  Heparin 3000 unit bolus x1 then increase rate to 1600 units/hr  F/u 6hr HL  Monitor daily HL, CBC/plt Monitor for signs/symptoms of bleeding   Benetta Spar, PharmD, BCPS, BCCP Clinical Pharmacist  Please check AMION for all [place] phone numbers After 10:00 PM, call [place]

## 2020-01-20 NOTE — Progress Notes (Signed)
PROGRESS NOTE  [name] NID:[id number] DOB: 09-24-1960 DOA: 01/19/2020 PCP: Vidal Schwalbe, MD  Brief History   59 year old man PMH including diabetes mellitus on insulin, left BKA, recent COVID-19 pneumonia with acute encephalopathy, presented with pain in left BKA stump and buttock.  Admitted for acute left lower extremity ischemia, underwent emergent left iliofemoral embolectomy   A & P  Acute left lower extremity ischemia --Status post emergent embolectomy left iliofemoral 10/18 --management per VVS  Insulin-dependent diabetes mellitus, last hemoglobin A1c 9.8% --stable, continue Lantus and SSI  Essential hypertension --Continue amlodipine  Anemia, most likely chronic disease --Appears stable.  Mild hyponatremia --Stable.  Follow clinically.  Recent admission for rhabdomyolysis, CK trending down no further evaluation suggested  Recent treatment for COVID-19 pneumonia, diagnosed October 7.  Asymptomatic.  Does not require isolation.  Disposition Plan:  Discussion: Comorbidities appear stable.  Continue management per vascular surgery.  Status is: Inpatient  Remains inpatient appropriate because:Inpatient level of care appropriate due to severity of illness   Dispo: The patient is from: Home              Anticipated d/c is to: Home (pt desires)              Anticipated d/c date is: 2 days              Patient currently is not medically stable to d/c.  DVT prophylaxis: SCD's Start: 01/19/20 2340   Code Status: Full Code Family Communication: none  Murray Hodgkins, MD  [facility] Direct contact: see [email or web address] (further directions at bottom of note if needed) 7PM-7AM contact night coverage as at bottom of note 01/20/2020, 5:55 PM  LOS: 0 days   Significant Hospital Events   .    Consults:  .    Procedures:  .   Significant Diagnostic Tests:  Marland Kitchen    Micro Data:  .    Antimicrobials:  .   Interval History/Subjective  CC: f/u left  leg pain  Still has pain in the left side but better today.  No other complaints noted.  Objective   Vitals:  Vitals:   01/20/20 1102 01/20/20 1710  BP: 110/65 126/63  Pulse: 82 78  Resp: 12 19  Temp: 97.6 F (36.4 C) 98.2 F (36.8 C)  SpO2: 94% 99%    Exam:  Physical Exam Constitutional:      General: He is not in acute distress.    Appearance: He is not ill-appearing or toxic-appearing.  Cardiovascular:     Rate and Rhythm: Normal rate and regular rhythm.     Heart sounds: Normal heart sounds. No murmur heard.  No friction rub. No gallop.      Comments: Telemetry SR Pulmonary:     Effort: Pulmonary effort is normal.     Breath sounds: Normal breath sounds.  Neurological:     Mental Status: He is alert.  Psychiatric:        Mood and Affect: Mood normal.        Behavior: Behavior normal.      I have personally reviewed the following:   Today's Data  . CBG stable . Sodium 131, remainder BMP unremarkable.  Sodium stable back to October 8. . Hgb stable 8.3  Scheduled Meds: . amitriptyline  25 mg Oral QHS  . amLODipine  10 mg Oral Daily  . aspirin EC  81 mg Oral Q0600  . docusate sodium  100 mg Oral Daily  . insulin aspart  0-9 Units Subcutaneous  Q4H  . insulin glargine  20 Units Subcutaneous QHS  . metoprolol succinate  25 mg Oral Daily  . pantoprazole  40 mg Oral Daily  . rosuvastatin  10 mg Oral Daily   Continuous Infusions: . sodium chloride    . heparin 1,600 Units/hr (01/20/20 1150)  . magnesium sulfate bolus IVPB      Principal Problem:   Ischemic leg Active Problems:   Normocytic anemia   Essential hypertension   DM type 2 causing vascular disease ([place])   Rhabdomyolysis   Lower limb ischemia   LOS: 0 days   How to contact the [facility] Attending or Consulting provider [place] or covering provider during after hours [place], for this patient?  1. Check the care team in [facility] and look for a) attending/consulting [facility] provider listed and b) the [facility]  team listed 2. Log into [email or web address] and use [place]'s universal password to access. If you do not have the password, please contact the hospital operator. 3. Locate the [facility] provider you are looking for under [facility] and page to a number that you can be directly reached. 4. If you still have difficulty reaching the provider, please page the [facility] (Director on Call) for the Hospitalists listed on amion for assistance.

## 2020-01-20 NOTE — Progress Notes (Signed)
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  ECHO pending  [name], MD [facility] [place] Office: [phone]

## 2020-01-20 NOTE — Anesthesia Postprocedure Evaluation (Signed)
Anesthesia Post Note  Patient: [name]  Procedure(s) Performed: ENDARTERECTOMY Ilio/FEMORAL (Left Groin)     Patient location during evaluation: PACU Anesthesia Type: General Level of consciousness: awake and alert Pain management: pain level controlled Vital Signs Assessment: post-procedure vital signs reviewed and stable Respiratory status: spontaneous breathing, nonlabored ventilation, respiratory function stable and patient connected to nasal cannula oxygen Cardiovascular status: blood pressure returned to baseline and stable Postop Assessment: no apparent nausea or vomiting Anesthetic complications: no   No complications documented.  Last Vitals:  Vitals:   01/19/20 2324 01/20/20 0339  BP: 137/67 126/69  Pulse:  83  Resp:  14  Temp: 36.9 C 36.7 C  SpO2:  100%    Last Pain:  Vitals:   01/20/20 0339  TempSrc: Oral  PainSc:                  Shawniece Oyola

## 2020-01-20 NOTE — Progress Notes (Signed)
OT Cancellation Note  Patient Details Name: [name] MRN: [id number] DOB: 12/23/1960   Cancelled Treatment:    Reason Eval/Treat Not Completed: Patient at procedure or test/ unavailable (vascular study). Plan to reattempt at a later time.   Tyrone Schimke, OT Acute Rehabilitation Services Pager: [phone] Office: [phone]  01/20/2020, 9:10 AM

## 2020-01-20 NOTE — Progress Notes (Signed)
ANTICOAGULATION CONSULT NOTE  Pharmacy Consult for heparin Indication: ischemic limb  No Known Allergies  Patient Measurements: Weight: 118.8 kg (262 lb) Heparin Dosing Weight: 93kg  Vital Signs: Temp: 98.2 F (36.8 C) (10/19 1710) Temp Source: Oral (10/19 1710) BP: 126/63 (10/19 1710) Pulse Rate: 78 (10/19 1710)  Labs: Recent Labs    01/19/20 1513 01/19/20 1513 01/19/20 1537 01/19/20 1543 01/19/20 2352 01/20/20 0820 01/20/20 1815  HGB 9.1*   < >  --  9.2* 8.3*  --   --   HCT 28.9*  --   --  27.0* 26.2*  --   --   PLT 566*  --   --   --  493*  --   --   HEPARINUNFRC  --   --   --   --   --  <0.10* 0.15*  CREATININE 0.58*  --   --  0.50* 0.64  --   --   CKTOTAL  --   --  3,385*  --   --   --   --    < > = values in this interval not displayed.    Estimated Creatinine Clearance: 119.6 mL/min (by C-G formula based on SCr of 0.64 mg/dL).   Medical History: Past Medical History:  Diagnosis Date  . Diabetes mellitus without complication ([place])   . GERD (gastroesophageal reflux disease)   . Gout   . Hyperlipidemia   . Hypertension   . Insomnia   . Osteoarthritis      Assessment: 59 yo M with hx L BKA admitted for ischemic LLE and thrombus s/p embolectomy 10/18. Pharmacy is consulted for heparin. Patient may require AKA if perfusion does not improve.   Patient received 10,000 units IV heparin during procedure. Heparin resumed at previous rate of 1400 units/hr. Follow up HL low at 0.15. No signs of bleeding.   Goal of Therapy:  Heparin level 0.3-0.7 units/ml Monitor platelets by anticoagulation protocol: Yes   Plan:  Increase heparin to 1900 units/hr Heparin level with am labs Monitor daily HL, CBC/plt Monitor for signs/symptoms of bleeding   Erin Hearing PharmD., BCPS Clinical Pharmacist 01/20/2020 7:00 PM  t  Please check AMION for all [place] phone numbers After 10:00 PM, call [place] [phone]

## 2020-01-20 NOTE — Evaluation (Signed)
Occupational Therapy Evaluation Patient Details Name: [name] MRN: [id number] DOB: 1961-03-15 Today's Date: 01/20/2020    History of Present Illness Pt is a 59 y.o. male with medical history significant for hypertension, hyperlipidemia, insulin-dependent diabetes mellitus, history of left BKA, and recent admission with acute encephalopathy suspected secondary to COVID-19, presented to the ED with pain at his left BKA stump and buttock. Pt found to have acute ischemia left leg. Now s/p left iliofemoral embolectomy on 01/19/20.   Clinical Impression   Pt admitted with the above diagnoses and presents with below problem list. Pt will benefit from continued acute OT to address the below listed deficits and maximize independence with basic ADLs prior to d/c to venue below. At baseline, pt utilizes prosthetic, mod I with ADLs, community ambulator. Pt currently mod A with LB ADLs in lateral lean position, min guard with lateral scoots. Limited by pain in L BKA stump with touch or movement.       Follow Up Recommendations  SNF    Equipment Recommendations  Other (comment) (defer to next venue)    Recommendations for Other Services       Precautions / Restrictions Precautions Precautions: Fall Restrictions Weight Bearing Restrictions: No      Mobility Bed Mobility Overal bed mobility: Needs Assistance Bed Mobility: Supine to Sit     Supine to sit: Min assist     General bed mobility comments: up in recliner  Transfers Overall transfer level: Needs assistance Equipment used: None Transfers: Lateral/Scoot Transfers          Lateral/Scoot Transfers: Min guard;Min assist;From elevated surface General transfer comment: Pt able to scoot back in recliner utilizing BUE strength. cues for technique. Currently completing OOB transfers using lateral scoot technique    Balance Overall balance assessment: Needs assistance Sitting-balance support: Feet supported;No upper  extremity supported Sitting balance-Leahy Scale: Good Sitting balance - Comments: seated at EOB                                   ADL either performed or assessed with clinical judgement   ADL Overall ADL's : Needs assistance/impaired Eating/Feeding: Set up;Sitting   Grooming: Set up;Sitting   Upper Body Bathing: Set up;Sitting   Lower Body Bathing: Sitting/lateral leans;Moderate assistance   Upper Body Dressing : Set up;Sitting   Lower Body Dressing: Moderate assistance;Sitting/lateral leans                 General ADL Comments: Pt able to bridge hips with cueing to boost back in recliner.      Vision         Perception     Praxis      Pertinent Vitals/Pain Pain Assessment: Faces Pain Score: 8  Faces Pain Scale: Hurts whole lot Pain Location: L stump while attempting to reposition into extension Pain Descriptors / Indicators: Discomfort;Grimacing;Guarding;Sore Pain Intervention(s): Monitored during session;Repositioned     Hand Dominance Right   Extremity/Trunk Assessment Upper Extremity Assessment Upper Extremity Assessment: Generalized weakness   Lower Extremity Assessment Lower Extremity Assessment: Defer to PT evaluation LLE: Unable to fully assess due to pain   Cervical / Trunk Assessment Cervical / Trunk Assessment: Normal   Communication Communication Communication: No difficulties   Cognition Arousal/Alertness: Awake/alert Behavior During Therapy: WFL for tasks assessed/performed;Flat affect Overall Cognitive Status: Within Functional Limits for tasks assessed  General Comments       Exercises General Exercises - Lower Extremity Ankle Circles/Pumps: Seated;AROM;Strengthening;Right;10 reps Long Arc Quad: Seated;AROM;Strengthening;Both;10 reps Hip Flexion/Marching: Seated;AROM;Strengthening;10 reps;Both   Shoulder Instructions      Home Living Family/patient expects  to be discharged to:: Private residence Living Arrangements: Alone Available Help at Discharge: Family;Available PRN/intermittently Type of Home: Mobile home Home Access: Ramped entrance     Home Layout: One level     Bathroom Shower/Tub: Occupational psychologist: Standard Bathroom Accessibility: Yes How Accessible: Accessible via wheelchair Home Equipment: [place] - 2 wheels;Cane - single point;Bedside commode;Shower seat;Wheelchair - manual          Prior Functioning/Environment Level of Independence: Independent with assistive device(s)        Comments: Community ambulator using LLE BKA prosthetic leg, drives per chart.  Pt states he was using wheelchair a lot more lately.         OT Problem List: Decreased strength;Decreased activity tolerance;Impaired balance (sitting and/or standing);Decreased knowledge of use of DME or AE;Decreased knowledge of precautions;Pain      OT Treatment/Interventions: Self-care/ADL training;Therapeutic exercise;Therapeutic activities;Neuromuscular education;DME and/or AE instruction;Patient/family education;Manual therapy;Balance training;Modalities    OT Goals(Current goals can be found in the care plan section) Acute Rehab OT Goals Patient Stated Goal: return home after rehab OT Goal Formulation: With patient Time For Goal Achievement: 02/03/20 Potential to Achieve Goals: Good ADL Goals Pt Will Perform Lower Body Bathing: with set-up;sitting/lateral leans;with supervision Pt Will Perform Lower Body Dressing: with set-up;with supervision;sitting/lateral leans Pt Will Transfer to Toilet: with min guard assist;with transfer board;anterior/posterior transfer;bedside commode Pt Will Perform Toileting - Clothing Manipulation and hygiene: with min guard assist;sitting/lateral leans Additional ADL Goal #1: Pt will complete bed mobility at mod I level to prepare for EOB/OOB ADLs.  OT Frequency: Min 2X/week   Barriers to D/C:             Co-evaluation              AM-PAC OT "6 Clicks" Daily Activity     Outcome Measure Help from another person eating meals?: None Help from another person taking care of personal grooming?: None Help from another person toileting, which includes using toliet, bedpan, or urinal?: A Little Help from another person bathing (including washing, rinsing, drying)?: A Lot Help from another person to put on and taking off regular upper body clothing?: A Little Help from another person to put on and taking off regular lower body clothing?: A Lot 6 Click Score: 18   End of Session    Activity Tolerance: Patient limited by pain;Patient tolerated treatment well Patient left: in chair;with call bell/phone within reach;with chair alarm set  OT Visit Diagnosis: Other abnormalities of gait and mobility (R26.89);Muscle weakness (generalized) (M62.81);Pain;History of falling (Z91.81)                Time: 9407-6808 OT Time Calculation (min): 9 min Charges:  OT General Charges $OT Visit: 1 Visit OT Evaluation $OT Eval Low Complexity: [place], OT Acute Rehabilitation Services Pager: [phone] Office: [phone]   Hortencia Pilar 01/20/2020, 2:01 PM

## 2020-01-20 NOTE — Hospital Course (Signed)
59 year old man PMH including diabetes mellitus on insulin, left BKA, recent COVID-19 pneumonia with acute encephalopathy, presented with pain in left BKA stump and buttock.  Admitted for acute left lower extremity ischemia, underwent emergent left iliofemoral embolectomy

## 2020-01-20 NOTE — Progress Notes (Signed)
Left lower extremity arterial duplex completed. Refer to "CV Proc" under chart review to view preliminary results.  Preliminary results discussed with Dr. Oneida Alar.  01/20/2020 9:55 AM Kelby Aline., MHA, RVT, RDCS, RDMS

## 2020-01-20 NOTE — Progress Notes (Deleted)
Physical Therapy Re-evaluation Patient Details Name: [name] MRN: [id number] DOB: 1961/02/10 Today's Date: 01/20/2020    History of Present Illness [name] is a 59 y.o. male with medical history significant for diabetes, GERD, gout, hyperlipidemia, hypertension, osteoarthritis and left BKA who presents to the emergency department  due to  altered mental status. Patient was unable to provide detailed history on why he came to the emergency department, he states that he came due to high blood glucose level. Patient also states that he fell a couple of times when he tried to transfer from his wheelchair to another chair. Most of the history was obtained from ED PA and ED medical chart. Per report, patient symptoms started about 2 weeks ago with a change in behavior, he lives alone and was able to take care of his ADLs including cooking his own food, but he has not been doing this recently. Patient's son checked on him today and found him naked on the floor, patient stated that he laid there all night per medical record (unusual behavior at baseline), he was taken to his PCP where he had a negative strep screen and pending labs and urinalysis. Patient was found on the floor 2 more times when checked upon by son today. His medications were found in unusual places in the house and is unknown if patient has been compliant with his medication (different from baseline where patient usually takes his medication). His urine was noted to be dark and foul-smelling today, he was then brought to the ED for further evaluation and management. Patient denies fever, chills, nausea, vomiting or abdominal pain. He denies history of tobacco or alcohol use.  Transferred to [facility] for left ileofemoral embolectomy which he had on 10/18.      PT Comments    Pt admitted with above diagnosis. Pt was able to scoot pivot with min guard to min assist to recliner.  Pt needed cues for sequencing and had a little  pain with movement. Overall did well and should progress well.  Agree with SNF for rehab.   Pt currently with functional limitations due to balance and endurance deficits. Pt will benefit from skilled PT to increase their independence and safety with mobility to allow discharge to the venue listed below.    Follow Up Recommendations  SNF     Equipment Recommendations  None recommended by PT    Recommendations for Other Services       Precautions / Restrictions Precautions Precautions: Fall Restrictions Weight Bearing Restrictions: No    Mobility  Bed Mobility Overal bed mobility: Needs Assistance Bed Mobility: Supine to Sit     Supine to sit: Min assist     General bed mobility comments: increased time; min assist due to pt in pain in groin  Transfers Overall transfer level: Needs assistance Equipment used: None Transfers: Lateral/Scoot Transfers          Lateral/Scoot Transfers: Min guard;Min assist;From elevated surface General transfer comment: Pt able to scoot to drop arm relcliner with min to min guard assist and cues for sequencing movement.   Ambulation/Gait                 Stairs             Wheelchair Mobility    Modified Rankin (Stroke Patients Only)       Balance Overall balance assessment: Needs assistance Sitting-balance support: Feet supported;No upper extremity supported Sitting balance-Leahy Scale: Good Sitting balance - Comments: seated at EOB  Cognition Arousal/Alertness: Awake/alert Behavior During Therapy: WFL for tasks assessed/performed Overall Cognitive Status: Within Functional Limits for tasks assessed                                        Exercises General Exercises - Lower Extremity Ankle Circles/Pumps: Seated;AROM;Strengthening;Right;10 reps Long Arc Quad: Seated;AROM;Strengthening;Both;10 reps Hip Flexion/Marching:  Seated;AROM;Strengthening;10 reps;Both    General Comments        Pertinent Vitals/Pain Pain Assessment: Faces Faces Pain Scale: Hurts even more Pain Location: surgical site Pain Descriptors / Indicators: Discomfort;Grimacing;Guarding;Sore Pain Intervention(s): Limited activity within patient's tolerance;Monitored during session;Repositioned;Premedicated before session    [place] expects to be discharged to:: Private residence Living Arrangements: Alone Available Help at Discharge: Family;Available PRN/intermittently Type of Home: Mobile home Home Access: Ramped entrance   Home Layout: One level Home Equipment: [place] - 2 wheels;Cane - single point;Bedside commode;Shower seat;Wheelchair - manual      Prior Function Level of Independence: Independent with assistive device(s)          PT Goals (current goals can now be found in the care plan section) Acute Rehab PT Goals Patient Stated Goal: return home after rehab Progress towards PT goals: Progressing toward goals    Frequency    Min 2X/week      PT Plan Current plan remains appropriate;Frequency needs to be updated    Co-evaluation              AM-PAC PT "6 Clicks" Mobility   Outcome Measure  Help needed turning from your back to your side while in a flat bed without using bedrails?: None Help needed moving from lying on your back to sitting on the side of a flat bed without using bedrails?: A Little Help needed moving to and from a bed to a chair (including a wheelchair)?: A Lot Help needed standing up from a chair using your arms (e.g., wheelchair or bedside chair)?: Total Help needed to walk in hospital room?: Total Help needed climbing 3-5 steps with a railing? : Total 6 Click Score: 12    End of Session Equipment Utilized During Treatment: Gait belt Activity Tolerance: Patient tolerated treatment well;Patient limited by fatigue;Patient limited by pain Patient left: in  chair;with call bell/phone within reach;with chair alarm set Nurse Communication: Mobility status PT Visit Diagnosis: Unsteadiness on feet (R26.81);Other abnormalities of gait and mobility (R26.89);Muscle weakness (generalized) (M62.81);History of falling (Z91.81)     Time: 0940-7680 PT Time Calculation (min) (ACUTE ONLY): 23 min  Charges:  $Therapeutic Activity: 8-22 mins            Re-evaluation:  8-22 mins         Alayla Dethlefs W,PT Acute Rehabilitation Services Pager:  [phone]  Office:  [place] 01/20/2020, 11:54 AM

## 2020-01-20 NOTE — Progress Notes (Signed)
  Echocardiogram 2D Echocardiogram has been performed.  [name] [name] 01/20/2020, 9:12 AM

## 2020-01-20 NOTE — Progress Notes (Addendum)
[facility]  Subjective  - Left LE feels much better.   Objective 126/69 83 98.1 F (36.7 C) (Oral) 14 100%  Intake/Output Summary (Last 24 hours) at 01/20/2020 0733 Last data filed at 01/20/2020 0655 Gross per 24 hour  Intake 750.12 ml  Output 1350 ml  Net -599.88 ml    Left stump cool to touch, sensation intact Left groin soft with healing incision without hematoma. Lungs non labored breathing Heart RRR  Assessment/Planning: POD # 1 Left iliofemoral embolectomy  Stump is cool touch, but sensation is intact. He states the left LE feels better. Cont. ASA and Heparin  Will order echo today to r/o source of clot  Roxy Horseman 01/20/2020 7:33 AM --  Left groin incision intact.  Agree with above.  Stump still feels cool at level of patella but clinically he says leg feels better states leg has been cool there since amp.  Most likely COVID thrombosis but will get ECHO to confirm no obvious cardiac source. Will duplex left groin today to confirm patency of CFA and profunda but doubt would repeat embolectomy at this point if reoccludes but would give prognosis.  Continue heparin will transition to oral agent over next few days depending on clinical condition if he progresses to [place] to resume diet  [name], MD [facility]: [phone]   Laboratory Lab Results: Recent Labs    01/19/20 1513 01/19/20 1513 01/19/20 1543 01/19/20 2352  WBC 19.9*  --   --  16.1*  HGB 9.1*   < > 9.2* 8.3*  HCT 28.9*   < > 27.0* 26.2*  PLT 566*  --   --  493*   < > = values in this interval not displayed.   BMET Recent Labs    01/19/20 1513 01/19/20 1513 01/19/20 1543 01/19/20 2352  NA 133*   < > 134* 131*  K 4.5   < > 4.3 3.9  CL 98   < > 100 99  CO2 23  --   --  24  GLUCOSE 145*   < > 145* 147*  BUN 16   < > 16 12  CREATININE 0.58*   < > 0.50* 0.64  CALCIUM 8.8*  --   --   8.4*   < > = values in this interval not displayed.    COAG Lab Results  Component Value Date   INR 1.3 (H) 01/09/2020   No results found for: PTT

## 2020-01-21 DIAGNOSIS — E1159 Type 2 diabetes mellitus with other circulatory complications: Secondary | ICD-10-CM | POA: Diagnosis not present

## 2020-01-21 DIAGNOSIS — D649 Anemia, unspecified: Secondary | ICD-10-CM | POA: Diagnosis not present

## 2020-01-21 DIAGNOSIS — I10 Essential (primary) hypertension: Secondary | ICD-10-CM | POA: Diagnosis not present

## 2020-01-21 DIAGNOSIS — I998 Other disorder of circulatory system: Secondary | ICD-10-CM | POA: Diagnosis not present

## 2020-01-21 LAB — HEPARIN LEVEL (UNFRACTIONATED)
Heparin Unfractionated: 0.29 IU/mL — ABNORMAL LOW (ref 0.30–0.70)
Heparin Unfractionated: 0.19 IU/mL — ABNORMAL LOW (ref 0.30–0.70)

## 2020-01-21 LAB — GLUCOSE, CAPILLARY
Glucose-Capillary: 133 mg/dL — ABNORMAL HIGH (ref 70–99)
Glucose-Capillary: 150 mg/dL — ABNORMAL HIGH (ref 70–99)
Glucose-Capillary: 161 mg/dL — ABNORMAL HIGH (ref 70–99)
Glucose-Capillary: 173 mg/dL — ABNORMAL HIGH (ref 70–99)
Glucose-Capillary: 201 mg/dL — ABNORMAL HIGH (ref 70–99)
Glucose-Capillary: 215 mg/dL — ABNORMAL HIGH (ref 70–99)
Glucose-Capillary: 262 mg/dL — ABNORMAL HIGH (ref 70–99)

## 2020-01-21 LAB — BASIC METABOLIC PANEL
Anion gap: 8 (ref 5–15)
BUN: 10 mg/dL (ref 8–23)
CO2: 25 mmol/L (ref 22–32)
Calcium: 8.7 mg/dL — ABNORMAL LOW (ref 8.9–10.3)
Chloride: 100 mmol/L (ref 98–111)
Creatinine, Ser: 0.63 mg/dL (ref 0.61–1.24)
GFR, Estimated: >60 mL/min (ref >60)
Glucose, Bld: 151 mg/dL — ABNORMAL HIGH (ref 70–99)
Potassium: 4.5 mmol/L (ref 3.5–5.1)
Sodium: 133 mmol/L — ABNORMAL LOW (ref 135–145)

## 2020-01-21 LAB — SURGICAL PATHOLOGY

## 2020-01-21 MED ORDER — APIXABAN 5 MG PO TABS: 5.0000 mg | ORAL_TABLET | Freq: Two times a day (BID) | ORAL | Status: DC

## 2020-01-21 MED ORDER — ROSUVASTATIN CALCIUM 20 MG PO TABS
20.0000 mg | ORAL_TABLET | Freq: Every day | ORAL | Status: DC
  Administered 2020-01-22 – 2020-02-01 (×10): 20 mg via ORAL
  Filled 2020-01-21 (×10): qty 1

## 2020-01-21 MED ORDER — HEPARIN BOLUS VIA INFUSION
2000.0000 [IU] | Freq: Once | INTRAVENOUS | Status: AC
  Administered 2020-01-21: 2000 [IU] via INTRAVENOUS
  Filled 2020-01-21: qty 2000

## 2020-01-21 MED ORDER — APIXABAN 5 MG PO TABS
10.0000 mg | ORAL_TABLET | Freq: Two times a day (BID) | ORAL | Status: DC
  Administered 2020-01-21 – 2020-01-25 (×9): 10 mg via ORAL
  Filled 2020-01-21 (×11): qty 2

## 2020-01-21 NOTE — Progress Notes (Signed)
PROGRESS NOTE    [name]  CBJ:[id number] DOB: 06/04/1960 DOA: 01/19/2020 PCP: Vidal Schwalbe, MD   Chief Complaint  Patient presents with  . Leg Pain    Brief Narrative:    59 year old gentleman prior history of left BKA, recent COVID-19 pneumonia, diabetes mellitus, insulin-dependent presents with severe pain in the left BKA stump antibiotics.  He was admitted for acute left lower extremity ischemia underwent emergent iliofemoral embolectomy and is on IV heparin.  Plan to transition to oral Eliquis today and possible discharge when bed available at SNF.   Assessment & Plan:   Principal Problem:   Ischemic leg Active Problems:   Normocytic anemia   Essential hypertension   DM type 2 causing vascular disease (HCC)   Rhabdomyolysis   Lower limb ischemia   Acute left lower extremity ischemia Duplex shows distal BKA SFA occlusion Echocardiogram does not show any source of thrombus. S/p emergent embolectomy, left iliofemoral on 01/19/2020. Plan for revision to AKA versus oral anticoagulation.  Patient on IV heparin transition to oral Eliquis today. Patient would like to think about it. He currently reports the pain in the left leg is tolerable. Recommend outpatient follow-up with vascular surgery in 2 to 3 weeks.  Mild hyponatremia: Unclear etiology, patient asymptomatic.   Rhabdomyolysis:  Improving.  CK at 3385.  Repeat CK levels in the morning.  Elevated liver enzymes:  Probably from rhabdomyolysis   Anemia of blood loss from the surgery superimposed on anemia of chronic disease Baseline hemoglobin around 9 currently at 8.3, repeat hemoglobin in the morning. Transfuse to keep hemoglobin greater than 7.   Essential hypertension Blood pressure parameters are well controlled.   Type 2 diabetes mellitus CBG (last 3)  Recent Labs    01/21/20 0419 01/21/20 0811 01/21/20 1157  GLUCAP 150* 161* 201*   Continue with sliding scale insulin.and  lantus.   Leukocytosis:  No source of infection.  Probably reactive.      DVT prophylaxis: (heparin gtt.  Code Status: full code Family Communication: (none at bedside.  Disposition:   Status is: Inpatient  Remains inpatient appropriate because:Unsafe d/c plan   Dispo: The patient is from: Home              Anticipated d/c is to: SNF              Anticipated d/c date is: 1 day              Patient currently is not medically stable to d/c.       Consultants:   Vascular surgery.   Procedures:S/p emergent embolectomy, left iliofemoral on 01/19/2020.  Antimicrobials:none.    Subjective: Left leg pain improving.   Objective: Vitals:   01/21/20 0002 01/21/20 0421 01/21/20 0813 01/21/20 1146  BP: (!) 151/71 (!) 102/59 (!) 101/56 107/69  Pulse: 80 76 62 69  Resp: 18 18 14 18   Temp: 98.3 F (36.8 C) 98.7 F (37.1 C) 98.3 F (36.8 C) 98 F (36.7 C)  TempSrc: Oral Oral Oral Oral  SpO2: 91% 91% 99% 97%  Weight:        Intake/Output Summary (Last 24 hours) at 01/21/2020 1418 Last data filed at 01/21/2020 0928 Gross per 24 hour  Intake 420.41 ml  Output 1000 ml  Net -579.59 ml   Filed Weights   01/19/20 1448  Weight: 118.8 kg    Examination:  General exam: Appears calm and comfortable  Respiratory system: Clear to auscultation. Respiratory effort normal. Cardiovascular system: S1 &  S2 heard, irregular, No pedal edema. Gastrointestinal system: Abdomen is nondistended, soft and nontender.  Normal bowel sounds heard. Central nervous system: Alert and oriented. No focal neurological deficits. Extremities: left stump BKA  Skin: No rashes, lesions or ulcers Psychiatry: . Mood & affect appropriate.     Data Reviewed: I have personally reviewed following labs and imaging studies  CBC: Recent Labs  Lab 01/15/20 1013 01/16/20 0633 01/19/20 1513 01/19/20 1543 01/19/20 2352  WBC 16.9* 21.8* 19.9*  --  16.1*  NEUTROABS 12.1* 16.6*  --   --   --   HGB  8.8* 8.9* 9.1* 9.2* 8.3*  HCT 26.5* 28.2* 28.9* 27.0* 26.2*  MCV 83.6 82.9 86.5  --  83.4  PLT 422* 422* 566*  --  493*    Basic Metabolic Panel: Recent Labs  Lab 01/15/20 1013 01/15/20 1013 01/16/20 0633 01/19/20 1513 01/19/20 1543 01/19/20 2352 01/21/20 0331  NA 128*   < > 130* 133* 134* 131* 133*  K 4.0   < > 4.2 4.5 4.3 3.9 4.5  CL 95*   < > 95* 98 100 99 100  CO2 23  --  24 23  --  24 25  GLUCOSE 339*   < > 191* 145* 145* 147* 151*  BUN 14   < > 14 16 16 12 10   CREATININE 0.60*   < > 0.54* 0.58* 0.50* 0.64 0.63  CALCIUM 8.7*  --  9.0 8.8*  --  8.4* 8.7*  MG 1.7  --  2.1  --   --   --   --    < > = values in this interval not displayed.    GFR: Estimated Creatinine Clearance: 119.6 mL/min (by C-G formula based on SCr of 0.63 mg/dL).  Liver Function Tests: Recent Labs  Lab 01/15/20 1013 01/16/20 0633  AST 60* 54*  ALT 51* 59*  ALKPHOS 90 117  BILITOT 0.4 0.4  PROT 7.5 8.2*  ALBUMIN 2.5* 2.6*    CBG: Recent Labs  Lab 01/20/20 1959 01/21/20 0003 01/21/20 0419 01/21/20 0811 01/21/20 1157  GLUCAP 127* 133* 150* 161* 201*     No results found for this or any previous visit (from the past 240 hour(s)).       Radiology Studies: ECHOCARDIOGRAM COMPLETE  Result Date: 01/20/2020    ECHOCARDIOGRAM REPORT   Patient Name:   [name] Date of Exam: 01/20/2020 Medical Rec #:  [id number]           Height:       67.0 in Accession #:    [id number]          Weight:       262.0 lb Date of Birth:  08-01-1960           BSA:          2.268 m Patient Age:    62 years            BP:           119/59 mmHg Patient Gender: M                   HR:           71 bpm. Exam Location:  Inpatient Procedure: 2D Echo, Cardiac Doppler and Color Doppler Indications:    Post-Operative State [id number].  History:        Patient has no prior history of Echocardiogram examinations.  Risk Factors:Hypertension, Diabetes and Dyslipidemia. COVID-19.                 GERD.   Sonographer:    Jonelle Sidle Dance Referring Phys: Bee  1. Left ventricular ejection fraction, by estimation, is 50 to 55%. The left ventricle has low normal function. The left ventricle demonstrates global hypokinesis. The left ventricular internal cavity size was mildly dilated. Left ventricular diastolic parameters are indeterminate.  2. Right ventricular systolic function is normal. The right ventricular size is normal.  3. Left atrial size was moderately dilated.  4. The mitral valve is normal in structure. No evidence of mitral valve regurgitation.  5. The aortic valve is grossly normal. There is mild calcification of the aortic valve. Aortic valve regurgitation is not visualized. No aortic stenosis is present. Comparison(s): No prior Echocardiogram. FINDINGS  Left Ventricle: Suggestive of impaired diastology. Left ventricular ejection fraction, by estimation, is 50 to 55%. The left ventricle has low normal function. The left ventricle demonstrates global hypokinesis. The left ventricular internal cavity size  was mildly dilated. There is no left ventricular hypertrophy. Left ventricular diastolic parameters are indeterminate. Right Ventricle: The right ventricular size is normal. Right vetricular wall thickness was not well visualized. Right ventricular systolic function is normal. Left Atrium: Left atrial size was moderately dilated. Right Atrium: Right atrial size was normal in size. Pericardium: There is no evidence of pericardial effusion. Mitral Valve: The mitral valve is normal in structure. No evidence of mitral valve regurgitation. Tricuspid Valve: The tricuspid valve is grossly normal. Tricuspid valve regurgitation is not demonstrated. Aortic Valve: The aortic valve is grossly normal. There is mild calcification of the aortic valve. Aortic valve regurgitation is not visualized. No aortic stenosis is present. Pulmonic Valve: The pulmonic valve was grossly normal. Pulmonic valve  regurgitation is not visualized. Aorta: The aortic root and ascending aorta are structurally normal, with no evidence of dilitation. IAS/Shunts: The atrial septum is grossly normal.  LEFT VENTRICLE PLAX 2D LVIDd:         6.00 cm  Diastology LVIDs:         4.79 cm  LV e' medial:    6.09 cm/s LV PW:         1.10 cm  LV E/e' medial:  12.7 LV IVS:        0.94 cm  LV e' lateral:   9.14 cm/s LVOT diam:     2.20 cm  LV E/e' lateral: 8.4 LV SV:         84 LV SV Index:   37 LVOT Area:     3.80 cm  RIGHT VENTRICLE             IVC RV Basal diam:  3.32 cm     IVC diam: 1.52 cm RV Mid diam:    1.70 cm RV S prime:     16.30 cm/s TAPSE (M-mode): 2.9 cm LEFT ATRIUM             Index       RIGHT ATRIUM           Index LA diam:        4.10 cm 1.81 cm/m  RA Area:     18.00 cm LA Vol (A2C):   95.7 ml 42.19 ml/m RA Volume:   44.80 ml  19.75 ml/m LA Vol (A4C):   73.9 ml 32.58 ml/m LA Biplane Vol: 84.5 ml 37.25 ml/m  AORTIC VALVE LVOT Vmax:  111.00 cm/s LVOT Vmean:  68.900 cm/s LVOT VTI:    0.222 m  AORTA Ao Root diam: 3.20 cm Ao Asc diam:  3.60 cm MITRAL VALVE MV Area (PHT): 3.27 cm    SHUNTS MV Decel Time: 232 msec    Systemic VTI:  0.22 m MV E velocity: 77.10 cm/s  Systemic Diam: 2.20 cm MV A velocity: 70.80 cm/s MV E/A ratio:  1.09 Rudean Haskell MD Electronically signed by Rudean Haskell MD Signature Date/Time: 01/20/2020/12:19:21 PM    Final    VAS [place] LOWER EXTREMITY ARTERIAL DUPLEX  Result Date: 01/20/2020 LOWER EXTREMITY ARTERIAL DUPLEX STUDY Indications: Left BKA with recent left iliofemoral embolectomy 01/19/2020, now              with cool stump and pain.  Current ABI: Not obtained Limitations: patient movement, patient position Comparison Study: No prior study Performing Technologist: Maudry Mayhew MHA, RDMS, RVT, RDCS  Examination Guidelines: A complete evaluation includes B-mode imaging, spectral Doppler, color Doppler, and power Doppler as needed of all accessible portions of each vessel.  Bilateral testing is considered an integral part of a complete examination. Limited examinations for reoccurring indications may be performed as noted.  +----------+--------+-----+---------------+-------------------+----------------+ LEFT      PSV cm/sRatioStenosis       Waveform           Comments         +----------+--------+-----+---------------+-------------------+----------------+ CFA Distal102                         monophasic                          +----------+--------+-----+---------------+-------------------+----------------+ DFA       100                         monophasic                          +----------+--------+-----+---------------+-------------------+----------------+ SFA Prox  220          50-74% stenosismonophasic                          +----------+--------+-----+---------------+-------------------+----------------+ SFA Mid   48                          dampened monophasicOccluded just                                                             distal to mid                                                             segment.         +----------+--------+-----+---------------+-------------------+----------------+ SFA Distal             occluded       absent                              +----------+--------+-----+---------------+-------------------+----------------+  POP Mid                occluded       absent                              +----------+--------+-----+---------------+-------------------+----------------+ PTA Prox               occluded       absent                              +----------+--------+-----+---------------+-------------------+----------------+  Summary: Left: Deep femoral artery is patent at the origin. 50-74% stenosis noted in the proximal superficial femoral artery. Superficial femoral artery occludes just distal to the mid segment. Popliteal and posterior tibial artery are occluded.  See  table(s) above for measurements and observations. Electronically signed by Curt Jews MD on 01/20/2020 at 4:29:02 PM.    Final         Scheduled Meds: . amitriptyline  25 mg Oral QHS  . amLODipine  10 mg Oral Daily  . aspirin EC  81 mg Oral Q0600  . docusate sodium  100 mg Oral Daily  . insulin aspart  0-9 Units Subcutaneous Q4H  . insulin glargine  20 Units Subcutaneous QHS  . metoprolol succinate  25 mg Oral Daily  . pantoprazole  40 mg Oral Daily  . [START ON 01/22/2020] rosuvastatin  20 mg Oral Daily   Continuous Infusions: . sodium chloride    . heparin 2,150 Units/hr (01/21/20 1315)  . magnesium sulfate bolus IVPB       LOS: 1 day       Hosie Poisson, MD [facility]   To contact the attending provider between 7A-7P or the covering provider during after hours 7P-7A, please log into the web site [email or web address] and access using universal [place] password for that web site. If you do not have the password, please call the hospital operator.  01/21/2020, 2:18 PM

## 2020-01-21 NOTE — Progress Notes (Signed)
ANTICOAGULATION CONSULT NOTE - Initial Consult  Pharmacy Consult for apixaban Indication: ischemic limb, multiple occluded LLE arteries   No Known Allergies  Patient Measurements: Weight: 118.8 kg (262 lb) Heparin Dosing Weight: 93kg  Vital Signs: Temp: 98 F (36.7 C) (10/20 1146) Temp Source: Oral (10/20 1146) BP: 107/69 (10/20 1146) Pulse Rate: 69 (10/20 1146)  Labs: Recent Labs    01/19/20 1513 01/19/20 1513 01/19/20 1537 01/19/20 1543 01/19/20 2352 01/20/20 0820 01/20/20 1815 01/21/20 0331 01/21/20 1013  HGB 9.1*   < >  --  9.2* 8.3*  --   --   --   --   HCT 28.9*  --   --  27.0* 26.2*  --   --   --   --   PLT 566*  --   --   --  493*  --   --   --   --   HEPARINUNFRC  --   --   --   --   --    < > 0.15* 0.29* 0.19*  CREATININE 0.58*   < >  --  0.50* 0.64  --   --  0.63  --   CKTOTAL  --   --  3,385*  --   --   --   --   --   --    < > = values in this interval not displayed.    Estimated Creatinine Clearance: 119.6 mL/min (by C-G formula based on SCr of 0.63 mg/dL).   Medical History: Past Medical History:  Diagnosis Date  . Diabetes mellitus without complication ([place])   . GERD (gastroesophageal reflux disease)   . Gout   . Hyperlipidemia   . Hypertension   . Insomnia   . Osteoarthritis      Assessment: 59 yo M with hx L BKA admitted for ischemic LLE and thrombus s/p embolectomy 10/18. Patient may require AKA if perfusion does not improve. 10/19 US shows occlusion in the LLE superficial femoral, popliteal and posterior tibial arteries. Pharmacy is consulted for apixaban.   Good renal function. Received heparin 2000 unit bolus at 1315 with subtherapeutic heparin levels. Will start apixaban 3 hours after bolus.    Goal of Therapy:  Heparin level 0.3-0.7 units/ml Monitor platelets by anticoagulation protocol: Yes   Plan:  Stop heparin Start apixaban 10mg  BID x 7 days then 5mg  BID  Monitor for signs/symptoms of bleeding   Benetta Spar, PharmD, BCPS,  BCCP Clinical Pharmacist  Please check AMION for all [place] phone numbers After 10:00 PM, call [place] [phone]

## 2020-01-21 NOTE — Progress Notes (Signed)
ANTICOAGULATION CONSULT NOTE - Initial Consult  Pharmacy Consult for heparin Indication: ischemic limb, multiple occluded LLE arteries   No Known Allergies  Patient Measurements: Weight: 118.8 kg (262 lb) Heparin Dosing Weight: 93kg  Vital Signs: Temp: 98.7 F (37.1 C) (10/20 0421) Temp Source: Oral (10/20 0421) BP: 102/59 (10/20 0421) Pulse Rate: 76 (10/20 0421)  Labs: Recent Labs    01/19/20 1513 01/19/20 1513 01/19/20 1537 01/19/20 1543 01/19/20 2352 01/20/20 0820 01/20/20 1815 01/21/20 0331  HGB 9.1*   < >  --  9.2* 8.3*  --   --   --   HCT 28.9*  --   --  27.0* 26.2*  --   --   --   PLT 566*  --   --   --  493*  --   --   --   HEPARINUNFRC  --   --   --   --   --  <0.10* 0.15* 0.29*  CREATININE 0.58*   < >  --  0.50* 0.64  --   --  0.63  CKTOTAL  --   --  3,385*  --   --   --   --   --    < > = values in this interval not displayed.    Estimated Creatinine Clearance: 119.6 mL/min (by C-G formula based on SCr of 0.63 mg/dL).   Medical History: Past Medical History:  Diagnosis Date  . Diabetes mellitus without complication ([place])   . GERD (gastroesophageal reflux disease)   . Gout   . Hyperlipidemia   . Hypertension   . Insomnia   . Osteoarthritis      Assessment: 59 yo M with hx L BKA admitted for ischemic LLE and thrombus s/p embolectomy 10/18. Pharmacy is consulted for heparin. Patient may require AKA if perfusion does not improve. 10/19 US shows occlusion in the LLE superficial femoral, popliteal and posterior tibial arteries.   HL dropped 0.29 to 0.19 despite rate increase to 2000 units/hr. Confirmed heparin is infusing, no issues per RN. No signs of bleeding. Will give another small bolus dose given subtherapeutic longer than 24 hours and increase rate.   Goal of Therapy:  Heparin level 0.3-0.7 units/ml Monitor platelets by anticoagulation protocol: Yes   Plan:  Give heparin 2000 units x1 and increase heparin to 2150 units/hr F/u 6hr HL   Monitor daily HL, CBC/plt Monitor for signs/symptoms of bleeding   Benetta Spar, PharmD, BCPS, BCCP Clinical Pharmacist  Please check AMION for all [place] phone numbers After 10:00 PM, call [place]

## 2020-01-21 NOTE — Progress Notes (Signed)
[facility] for heparin Indication: ischemic limb   Assessment: 59 yo M with hx L BKA admitted for ischemic LLE and thrombus s/p embolectomy 10/18. Pharmacy is consulted for heparin. Patient may require AKA if perfusion does not improve.   Heparin level this am 0.29 units/ml  Goal of Therapy:  Heparin level 0.3-0.7 units/ml Monitor platelets by anticoagulation protocol: Yes   Plan:  Increase heparin to 2000 units/hr Monitor daily HL, CBC/plt Monitor for signs/symptoms of bleeding   Thanks for allowing pharmacy to be a part of this patient's care.  Excell Seltzer, PharmD Clinical Pharmacist

## 2020-01-21 NOTE — Progress Notes (Signed)
PHARMACIST LIPID MONITORING   [name] is a 59 y.o. male admitted on 01/19/2020 with LLE BLA stump arterial occlusion.  Pharmacy has been consulted to optimize lipid-lowering therapy with the indication of secondary prevention for clinical ASCVD.  Recent Labs:  Lipid Panel (last 6 months):   Lab Results  Component Value Date   CHOL 89 01/19/2020   TRIG 76 01/19/2020   HDL 30 (L) 01/19/2020   CHOLHDL 3.0 01/19/2020   VLDL 15 01/19/2020   LDLCALC 44 01/19/2020    Hepatic function panel (last 6 months):   Lab Results  Component Value Date   AST 54 (H) 01/16/2020   ALT 59 (H) 01/16/2020   ALKPHOS 117 01/16/2020   BILITOT 0.4 01/16/2020    SCr (since admission):   Serum creatinine: 0.63 mg/dL 01/21/20 0331 Estimated creatinine clearance: 119.6 mL/min  Current lipid-lowering therapy: rosuvastatin 10mg  Documented or reported allergies or intolerances to lipid-lowering therapies (if applicable): none  Assessment:  Patient agrees with changes to lipid-lowering therapy  Recommendation per protocol:  Increase intensity or dose of current statin.  Follow-up with:  Primary care provider - Vidal Schwalbe, MD  Follow-up labs after discharge:    Liver function panel and lipid panel in 8-12 weeks then annually  Plan: Increase rosuvastatin to 20mg  daily Monitor for signs of myopathy  Benetta Spar, PharmD, BCPS, BCCP Clinical Pharmacist  Please check AMION for all [place] phone numbers After 10:00 PM, call [place]

## 2020-01-21 NOTE — Progress Notes (Addendum)
[facility] [place]  Subjective  - Doing well over all.  States his left stump feels better than before.   Objective (!) 102/59 76 98.7 F (37.1 C) (Oral) 18 91%  Intake/Output Summary (Last 24 hours) at 01/21/2020 0350 Last data filed at 01/21/2020 0300 Gross per 24 hour  Intake 660.41 ml  Output 400 ml  Net 260.41 ml    Left stump BKA cool to touch Groin soft incision healing well, dy guaze placed in groin crease   Assessment/Planning: POD # 2 Left iliofemoral embolectomy  Echo shows no source of thrombus Duplex distal BK SFA occlusion Plan for revision to AKA verse oral anticoagulation and wants to keep the BKA and start anticoagulation.  Primary can choose. From a vascular point of view he is stable. F/U with [facility] in 2-3 weeks.  Roxy Horseman 01/21/2020 8:08 AM -- Agree with above.  Has chronic pain in left BkA stump.  Seems to be back to baseline. Incision healing Needs long term anticoagulation ECHO essentially normal presumably clotting related to COVID 19  If he has continued pain will need AKA but he wants a few weeks to think about it  Anticoagulation choice per primary team   [name], MD [facility] [place]: [phone]  Laboratory Lab Results: Recent Labs    01/19/20 1513 01/19/20 1513 01/19/20 1543 01/19/20 2352  WBC 19.9*  --   --  16.1*  HGB 9.1*   < > 9.2* 8.3*  HCT 28.9*   < > 27.0* 26.2*  PLT 566*  --   --  493*   < > = values in this interval not displayed.   BMET Recent Labs    01/19/20 2352 01/21/20 0331  NA 131* 133*  K 3.9 4.5  CL 99 100  CO2 24 25  GLUCOSE 147* 151*  BUN 12 10  CREATININE 0.64 0.63  CALCIUM 8.4* 8.7*    COAG Lab Results  Component Value Date   INR 1.3 (H) 01/09/2020   No results found for: PTT

## 2020-01-22 DIAGNOSIS — D649 Anemia, unspecified: Secondary | ICD-10-CM | POA: Diagnosis not present

## 2020-01-22 DIAGNOSIS — E1159 Type 2 diabetes mellitus with other circulatory complications: Secondary | ICD-10-CM | POA: Diagnosis not present

## 2020-01-22 DIAGNOSIS — I10 Essential (primary) hypertension: Secondary | ICD-10-CM | POA: Diagnosis not present

## 2020-01-22 DIAGNOSIS — I998 Other disorder of circulatory system: Secondary | ICD-10-CM | POA: Diagnosis not present

## 2020-01-22 LAB — CBC WITH DIFFERENTIAL/PLATELET
Abs Immature Granulocytes: 0.09 K/uL — ABNORMAL HIGH (ref 0.00–0.07)
Basophils Absolute: 0 K/uL (ref 0.0–0.1)
Basophils Relative: 0 %
Eosinophils Absolute: 0.1 K/uL (ref 0.0–0.5)
Eosinophils Relative: 1 %
HCT: 26.7 % — ABNORMAL LOW (ref 39.0–52.0)
Hemoglobin: 8.3 g/dL — ABNORMAL LOW (ref 13.0–17.0)
Immature Granulocytes: 1 %
Lymphocytes Relative: 25 %
Lymphs Abs: 3.4 K/uL (ref 0.7–4.0)
MCH: 26.9 pg (ref 26.0–34.0)
MCHC: 31.1 g/dL (ref 30.0–36.0)
MCV: 86.7 fL (ref 80.0–100.0)
Monocytes Absolute: 1 K/uL (ref 0.1–1.0)
Monocytes Relative: 8 %
Neutro Abs: 9.1 K/uL — ABNORMAL HIGH (ref 1.7–7.7)
Neutrophils Relative %: 65 %
Platelets: 446 K/uL — ABNORMAL HIGH (ref 150–400)
RBC: 3.08 MIL/uL — ABNORMAL LOW (ref 4.22–5.81)
RDW: 14.5 % (ref 11.5–15.5)
WBC: 13.7 K/uL — ABNORMAL HIGH (ref 4.0–10.5)
nRBC: 0 % (ref 0.0–0.2)

## 2020-01-22 LAB — GLUCOSE, CAPILLARY
Glucose-Capillary: 180 mg/dL — ABNORMAL HIGH (ref 70–99)
Glucose-Capillary: 206 mg/dL — ABNORMAL HIGH (ref 70–99)
Glucose-Capillary: 276 mg/dL — ABNORMAL HIGH (ref 70–99)
Glucose-Capillary: 243 mg/dL — ABNORMAL HIGH (ref 70–99)
Glucose-Capillary: 213 mg/dL — ABNORMAL HIGH (ref 70–99)

## 2020-01-22 LAB — BASIC METABOLIC PANEL
Anion gap: 8 (ref 5–15)
BUN: 8 mg/dL (ref 8–23)
CO2: 25 mmol/L (ref 22–32)
Calcium: 8.4 mg/dL — ABNORMAL LOW (ref 8.9–10.3)
Chloride: 100 mmol/L (ref 98–111)
Creatinine, Ser: 0.63 mg/dL (ref 0.61–1.24)
GFR, Estimated: >60 mL/min (ref >60)
Glucose, Bld: 147 mg/dL — ABNORMAL HIGH (ref 70–99)
Potassium: 4.8 mmol/L (ref 3.5–5.1)
Sodium: 133 mmol/L — ABNORMAL LOW (ref 135–145)

## 2020-01-22 LAB — CK: Total CK: 1847 U/L — ABNORMAL HIGH (ref 49–397)

## 2020-01-22 MED ORDER — INSULIN GLARGINE 100 UNIT/ML ~~LOC~~ SOLN
30.0000 [IU] | Freq: Every day | SUBCUTANEOUS | Status: DC
  Administered 2020-01-22: 30 [IU] via SUBCUTANEOUS
  Filled 2020-01-22 (×2): qty 0.3

## 2020-01-22 NOTE — Progress Notes (Addendum)
Progress Note    01/22/2020 7:33 AM 3 Days Post-Op  Subjective:  Continues with complaints of left residual limb pain. Did not sleep well.   Vitals:   01/21/20 2302 01/22/20 0459  BP: 113/62 124/77  Pulse: 77 83  Resp: 18 19  Temp: 98.6 F (37 C) 98.5 F (36.9 C)  SpO2: 95% 96%    Physical Exam: Cardiac:  RRR Lungs:  nonlabored Incisions:  Left groin incision well approximated and without drainage Extremities:  Left lower leg cool to touch   CBC    Component Value Date/Time   WBC 13.7 (H) 01/22/2020 0035   RBC 3.08 (L) 01/22/2020 0035   HGB 8.3 (L) 01/22/2020 0035   HGB 11.5 (L) 08/22/2012 1857   HCT 26.7 (L) 01/22/2020 0035   HCT 34.0 (L) 08/22/2012 1857   PLT 446 (H) 01/22/2020 0035   PLT 345 08/22/2012 1857   MCV 86.7 01/22/2020 0035   MCV 90 08/22/2012 1857   MCH 26.9 01/22/2020 0035   MCHC 31.1 01/22/2020 0035   RDW 14.5 01/22/2020 0035   RDW 16.7 (H) 08/22/2012 1857   LYMPHSABS 3.4 01/22/2020 0035   LYMPHSABS 2.1 08/05/2011 1228   MONOABS 1.0 01/22/2020 0035   MONOABS 0.9 08/05/2011 1228   EOSABS 0.1 01/22/2020 0035   EOSABS 0.0 08/05/2011 1228   BASOSABS 0.0 01/22/2020 0035   BASOSABS 0.0 08/05/2011 1228    BMET    Component Value Date/Time   NA 133 (L) 01/22/2020 0035   NA 137 10/28/2018 1411   NA 134 (L) 08/22/2012 1857   K 4.8 01/22/2020 0035   K 4.5 08/22/2012 1857   CL 100 01/22/2020 0035   CL 101 08/22/2012 1857   CO2 25 01/22/2020 0035   CO2 23 08/22/2012 1857   GLUCOSE 147 (H) 01/22/2020 0035   GLUCOSE 379 (H) 08/22/2012 1857   BUN 8 01/22/2020 0035   BUN 12 10/28/2018 1411   BUN 6 (L) 08/22/2012 1857   CREATININE 0.63 01/22/2020 0035   CREATININE 0.84 07/10/2019 0957   CALCIUM 8.4 (L) 01/22/2020 0035   CALCIUM 9.1 08/22/2012 1857   GFRNONAA >60 01/22/2020 0035   GFRNONAA 95 07/10/2019 0957   GFRAA 110 07/10/2019 0957     Intake/Output Summary (Last 24 hours) at 01/22/2020 0733 Last data filed at 01/21/2020  1930 Gross per 24 hour  Intake 240 ml  Output 1500 ml  Net -1260 ml    HOSPITAL MEDICATIONS Scheduled Meds: . amitriptyline  25 mg Oral QHS  . amLODipine  10 mg Oral Daily  . apixaban  10 mg Oral BID   Followed by  . [START ON 01/28/2020] apixaban  5 mg Oral BID  . aspirin EC  81 mg Oral Q0600  . docusate sodium  100 mg Oral Daily  . insulin aspart  0-9 Units Subcutaneous Q4H  . insulin glargine  20 Units Subcutaneous QHS  . metoprolol succinate  25 mg Oral Daily  . pantoprazole  40 mg Oral Daily  . rosuvastatin  20 mg Oral Daily   Continuous Infusions: . sodium chloride    . magnesium sulfate bolus IVPB     PRN Meds:.sodium chloride, acetaminophen **OR** acetaminophen, alum & mag hydroxide-simeth, guaiFENesin-dextromethorphan, hydrALAZINE, HYDROmorphone (DILAUDID) injection, labetalol, magnesium sulfate bolus IVPB, metoprolol tartrate, ondansetron **OR** ondansetron (ZOFRAN) IV, oxyCODONE-acetaminophen, phenol, polyethylene glycol, potassium chloride  Assessment: slp left ileofemoral thrombectomy. Chronic left residual limb pain.   Plan: On Eliquis. Declines conversion to left AKA revision currently  [facility],  PA-C [facility] [phone] 01/22/2020  7:33 AM   Agree with above.  Awaiting SNF.  [name], MD [facility] [place] Office: [phone]

## 2020-01-22 NOTE — Progress Notes (Signed)
Physical Therapy Treatment Patient Details Name: [name] MRN: [id number] DOB: [birth year] Today's Date: 01/22/2020    History of Present Illness Pt is a 59 y.o. male with medical history significant for hypertension, hyperlipidemia, insulin-dependent diabetes mellitus, history of left BKA, and recent admission with acute encephalopathy suspected secondary to COVID-19, presented to the ED with pain at his left BKA stump and buttock. Pt found to have acute ischemia left leg. Now s/p left iliofemoral embolectomy on 01/19/20.    PT Comments    Pt supine in bed on arrival this session.  Pt required max cues for encouragement this session.  Pt slow and guarded and required cues to reorient to session.  Educated on the benefits of rehab to improve strength and function before returning home.  Pt continues to benefit from skilled rehab in a post acute settting before returning home.      Follow Up Recommendations  SNF     Equipment Recommendations  None recommended by PT    Recommendations for Other Services       Precautions / Restrictions Precautions Precautions: Fall Restrictions Weight Bearing Restrictions: No    Mobility  Bed Mobility Overal bed mobility: Needs Assistance Bed Mobility: Supine to Sit;Sit to Supine     Supine to sit: Min assist     General bed mobility comments: Cues for sequencing and assistance to elevate trunk into sitting to move to edge of bed.  Transfers Overall transfer level: Needs assistance Equipment used: None Transfers: Squat Pivot Transfers Sit to Stand: Min assist         General transfer comment: Min assistance to raise hips and pivot from bed to recliner chair.  Ambulation/Gait                 Stairs             Wheelchair Mobility    Modified Rankin (Stroke Patients Only)       Balance Overall balance assessment: Needs assistance   Sitting balance-Leahy Scale: Good       Standing balance-Leahy  Scale: Poor                              Cognition Arousal/Alertness: Awake/alert Behavior During Therapy: WFL for tasks assessed/performed;Flat affect Overall Cognitive Status: Impaired/Different from baseline Area of Impairment: Memory;Orientation                 Orientation Level: Disoriented to;Time;Situation   Memory: Decreased short-term memory (denies eating breakfast despite empty tray in room.)                Exercises      General Comments        Pertinent Vitals/Pain Pain Assessment: 0-10 Pain Score: 10-Worst pain ever Pain Location: LLE Pain Descriptors / Indicators: Discomfort;Grimacing;Guarding;Sore Pain Intervention(s): Monitored during session;Repositioned    Home Living                      Prior Function            PT Goals (current goals can now be found in the care plan section) Acute Rehab PT Goals Patient Stated Goal: return home after rehab Potential to Achieve Goals: Good Progress towards PT goals: Progressing toward goals    Frequency    Min 2X/week      PT Plan Current plan remains appropriate;Frequency needs to be updated    Co-evaluation  AM-PAC PT "6 Clicks" Mobility   Outcome Measure  Help needed turning from your back to your side while in a flat bed without using bedrails?: None Help needed moving from lying on your back to sitting on the side of a flat bed without using bedrails?: A Little Help needed moving to and from a bed to a chair (including a wheelchair)?: A Little Help needed standing up from a chair using your arms (e.g., wheelchair or bedside chair)?: A Little Help needed to walk in hospital room?: Total Help needed climbing 3-5 steps with a railing? : Total 6 Click Score: 15    End of Session Equipment Utilized During Treatment: Gait belt Activity Tolerance: Patient tolerated treatment well;Patient limited by fatigue;Patient limited by pain Patient left: in  chair;with call bell/phone within reach;with chair alarm set Nurse Communication: Mobility status PT Visit Diagnosis: Unsteadiness on feet (R26.81);Other abnormalities of gait and mobility (R26.89);Muscle weakness (generalized) (M62.81);History of falling (Z91.81)     Time: 9563-8756 PT Time Calculation (min) (ACUTE ONLY): 23 min  Charges:  $Therapeutic Activity: 23-37 mins                     Erasmo Leventhal , PTA Acute Rehabilitation Services Pager [phone] Office [phone]     Demetrius Barrell Eli Hose 01/22/2020, 10:59 AM

## 2020-01-22 NOTE — NC FL2 (Signed)
[place] LEVEL OF CARE SCREENING TOOL     IDENTIFICATION  Patient Name: [name] Birthdate: 1960-09-26 Sex: male Admission Date (Current Location): 01/19/2020  [place] and [place] Number:  [place] [id number] [place] and Address:  [facility] [place], [place] [address], [place], [place] [address]      Provider Number: [id number]  Attending Physician Name and Address:  Hosie Poisson, MD  Relative Name and Phone Number:  Colonel Krauser. (son) Ph: [phone]    Current Level of Care: Hospital Recommended Level of Care: [place] Prior Approval Number:    Date Approved/Denied:   PASRR Number: [id number]  Discharge Plan: SNF    Current Diagnoses: Patient Active Problem List   Diagnosis Date Noted  . Lower limb ischemia 01/20/2020  . Ischemic leg 01/19/2020  . Altered mental status 01/09/2020  . Hyperkalemia 01/09/2020  . Transaminitis 01/09/2020  . Dehydration 01/09/2020  . Leukocytosis 01/09/2020  . GERD (gastroesophageal reflux disease) 01/09/2020  . Recurrent falls 01/09/2020  . Obesity (BMI 30-39.9) 01/09/2020  . Prolonged QT interval 01/09/2020  . Hyperglycemia due to diabetes mellitus ([place]) 01/09/2020  . Rhabdomyolysis 01/08/2020  . Encounter for screening colonoscopy 05/27/2018  . Vitamin D deficiency 02/06/2018  . Mixed hyperlipidemia 12/12/2017  . S/P BKA (below knee amputation) unilateral, left ([place]) 11/29/2017  . Unilateral complete BKA, right, sequela ([place])   . Thrombocytosis   . Constipation due to pain medication   . Below knee amputation status, left   . Unilateral complete BKA, left, initial encounter ([place])   . Acute blood loss anemia   . Post-operative pain   . Essential hypertension, benign   . Tobacco abuse   . S/P amputation   . Subacute osteomyelitis, left ankle and foot ([place])   . Acute osteomyelitis of left calcaneus (HCC)   . Cutaneous abscess of left foot   . Severe  protein-calorie malnutrition ([place])   . PAD (peripheral artery disease) ([place])   . Wound infection 10/02/2017  . Type 2 diabetes mellitus treated with insulin ([place])   . Tobacco use disorder   . Lymphedema 05/28/2017  . Essential hypertension 05/28/2017  . DM type 2 causing vascular disease ([place]) 05/28/2017  . DJD (degenerative joint disease) 05/28/2017  . Hyponatremia 04/05/2016  . Normocytic anemia 04/05/2016  . Gout 04/05/2016  . Bilateral foot pain 04/04/2016    Orientation RESPIRATION BLADDER Height & Weight     Self, Place  Normal Continent, External catheter Weight: 262 lb (118.8 kg) Height:     BEHAVIORAL SYMPTOMS/MOOD NEUROLOGICAL BOWEL NUTRITION STATUS      Continent Diet (Please see DC Summary)  AMBULATORY STATUS COMMUNICATION OF NEEDS Skin   Extensive Assist Verbally Surgical wounds (Closed incision on groin)                       Personal Care Assistance Level of Assistance  Bathing, Feeding, Dressing Bathing Assistance: Maximum assistance Feeding assistance: Limited assistance Dressing Assistance: Maximum assistance     Functional Limitations Info  Sight, Hearing, Speech Sight Info: Adequate Hearing Info: Adequate Speech Info: Adequate    SPECIAL CARE FACTORS FREQUENCY  PT (By licensed PT)     PT Frequency: 3x/week              Contractures Contractures Info: Not present    Additional Factors Info  Code Status, Allergies, Insulin Sliding Scale Code Status Info: Full Allergies Info: NKA   Insulin Sliding Scale Info: See DC  Summary Isolation Precautions Info: COVID+ 01/08/20     Current Medications (01/22/2020):  This is the current hospital active medication list Current Facility-Administered Medications  Medication Dose Route Frequency Provider Last Rate Last Admin  . 0.9 %  sodium chloride infusion  500 mL Intravenous Once PRN Baglia, Corrina, PA-C      . acetaminophen (TYLENOL) tablet 650 mg  650 mg Oral Q6H PRN Baglia, Corrina, PA-C        Or  . acetaminophen (TYLENOL) suppository 650 mg  650 mg Rectal Q6H PRN Baglia, Corrina, PA-C      . alum & mag hydroxide-simeth (MAALOX/MYLANTA) 200-200-20 MG/5ML suspension 15-30 mL  15-30 mL Oral Q2H PRN Baglia, Corrina, PA-C      . amitriptyline (ELAVIL) tablet 25 mg  25 mg Oral QHS Baglia, Corrina, PA-C   25 mg at 01/21/20 2019  . amLODipine (NORVASC) tablet 10 mg  10 mg Oral Daily Baglia, Corrina, PA-C   10 mg at 01/22/20 1056  . apixaban (ELIQUIS) tablet 10 mg  10 mg Oral BID Donnamae Jude, RPH   10 mg at 01/22/20 1056   Followed by  . [START ON 01/28/2020] apixaban (ELIQUIS) tablet 5 mg  5 mg Oral BID Donnamae Jude, [facility]      . aspirin EC tablet 81 mg  81 mg Oral Q0600 Baglia, Corrina, PA-C   81 mg at 01/22/20 0546  . docusate sodium (COLACE) capsule 100 mg  100 mg Oral Daily Baglia, Corrina, PA-C   100 mg at 01/22/20 1056  . guaiFENesin-dextromethorphan (ROBITUSSIN DM) 100-10 MG/5ML syrup 15 mL  15 mL Oral Q4H PRN Baglia, Corrina, PA-C      . hydrALAZINE (APRESOLINE) injection 5 mg  5 mg Intravenous Q20 Min PRN Baglia, Corrina, PA-C      . HYDROmorphone (DILAUDID) injection 0.5-1 mg  0.5-1 mg Intravenous Q2H PRN Baglia, Corrina, PA-C   1 mg at 01/21/20 2021  . insulin aspart (novoLOG) injection 0-9 Units  0-9 Units Subcutaneous Q4H Baglia, Corrina, PA-C   3 Units at 01/22/20 1658  . insulin glargine (LANTUS) injection 30 Units  30 Units Subcutaneous QHS Hosie Poisson, MD      . labetalol (NORMODYNE) injection 10 mg  10 mg Intravenous Q10 min PRN Baglia, Corrina, PA-C      . magnesium sulfate IVPB 2 g 50 mL  2 g Intravenous Daily PRN Baglia, Corrina, PA-C      . metoprolol succinate (TOPROL-XL) 24 hr tablet 25 mg  25 mg Oral Daily Baglia, Corrina, PA-C   25 mg at 01/22/20 1057  . metoprolol tartrate (LOPRESSOR) injection 2-5 mg  2-5 mg Intravenous Q2H PRN Baglia, Corrina, PA-C      . ondansetron (ZOFRAN) tablet 4 mg  4 mg Oral Q6H PRN Baglia, Corrina, PA-C       Or  . ondansetron  (ZOFRAN) injection 4 mg  4 mg Intravenous Q6H PRN Baglia, Corrina, PA-C      . oxyCODONE-acetaminophen (PERCOCET/ROXICET) 5-325 MG per tablet 1-2 tablet  1-2 tablet Oral Q4H PRN Baglia, Corrina, PA-C   2 tablet at 01/22/20 1100  . pantoprazole (PROTONIX) EC tablet 40 mg  40 mg Oral Daily Baglia, Corrina, PA-C   40 mg at 01/22/20 1056  . phenol (CHLORASEPTIC) mouth spray 1 spray  1 spray Mouth/Throat PRN Baglia, Corrina, PA-C      . polyethylene glycol (MIRALAX / GLYCOLAX) packet 17 g  17 g Oral Daily PRN Baglia, Corrina, PA-C      .  potassium chloride SA (KLOR-CON) CR tablet 20-40 mEq  20-40 mEq Oral Daily PRN Baglia, Corrina, PA-C      . rosuvastatin (CRESTOR) tablet 20 mg  20 mg Oral Daily Donnamae Jude, RPH   20 mg at 01/22/20 1058     Discharge Medications: Please see discharge summary for a list of discharge medications.  Relevant Imaging Results:  Relevant Lab Results:   Additional Information SSN: [id number]  Vinie Sill, LCSWA

## 2020-01-22 NOTE — TOC Initial Note (Signed)
Transition of Care [facility]) - Initial/Assessment Note    Patient Details  Name: [name] MRN: [id number] Date of Birth: 1960/05/27  Transition of Care [facility]) CM/SW Contact:    Vinie Sill, [place] Phone Number: 01/22/2020, 4:32 PM  Clinical Narrative:                   CSW visit with patient at bedside. Patient was alert and oriented. CSW introduced self and explained role. CSW discuss PT recommendation of short term rehab at [facility]. Patient states he lives home alone. Patient states he is agreeable to short rehab at [facility]. Patient states no preferred SNF. Patient states he has not been vaccinated and is not interested in receiving the vaccine. Patient states he only income is SSi and is agreeable to staying 30 day at SNF and if need to give up portion of his income. CSW explained the SNF process.  CSW will provide bed offers once available.  CSW will continue to follow and assist with discharge planning.  Thurmond Butts, MSW, [place] Clinical Social Worker   Expected Discharge Plan: Skilled Nursing Facility Barriers to Discharge: Continued Medical Work up, SNF Pending bed offer   Patient Goals and CMS Choice        Expected Discharge Plan and Services Expected Discharge Plan: [place] In-house Referral: Clinical Social Work     Living arrangements for the past 2 months: Single Family Home                                      Prior Living Arrangements/Services Living arrangements for the past 2 months: Single Family Home Lives with:: Self Patient language and need for interpreter reviewed:: No        Need for Family Participation in Patient Care: Yes (Comment)     Criminal Activity/Legal Involvement Pertinent to Current Situation/Hospitalization: No - Comment as needed  Activities of Daily Living      Permission Sought/Granted Permission sought to share information with : Family Supports Permission granted to share information with  : Yes, Verbal Permission Granted  Share Information with NAME: [name]  Permission granted to share info w AGENCY: SNFs  Permission granted to share info w Relationship: son  Permission granted to share info w Contact Information: [phone]  Emotional Assessment   Attitude/Demeanor/Rapport: Engaged (annoyed- in pain) Affect (typically observed): Accepting, Appropriate Orientation: : Oriented to Self, Oriented to Place, Oriented to  Time, Oriented to Situation Alcohol / Substance Use: Not Applicable Psych Involvement: No (comment)  Admission diagnosis:  Ischemic leg [I99.8] Lower limb ischemia [I99.8] Patient Active Problem List   Diagnosis Date Noted  . Lower limb ischemia 01/20/2020  . Ischemic leg 01/19/2020  . Altered mental status 01/09/2020  . Hyperkalemia 01/09/2020  . Transaminitis 01/09/2020  . Dehydration 01/09/2020  . Leukocytosis 01/09/2020  . GERD (gastroesophageal reflux disease) 01/09/2020  . Recurrent falls 01/09/2020  . Obesity (BMI 30-39.9) 01/09/2020  . Prolonged QT interval 01/09/2020  . Hyperglycemia due to diabetes mellitus ([place]) 01/09/2020  . Rhabdomyolysis 01/08/2020  . Encounter for screening colonoscopy 05/27/2018  . Vitamin D deficiency 02/06/2018  . Mixed hyperlipidemia 12/12/2017  . S/P BKA (below knee amputation) unilateral, left ([place]) 11/29/2017  . Unilateral complete BKA, right, sequela ([place])   . Thrombocytosis   . Constipation due to pain medication   . Below knee amputation status, left   . Unilateral complete BKA,  left, initial encounter ([place])   . Acute blood loss anemia   . Post-operative pain   . Essential hypertension, benign   . Tobacco abuse   . S/P amputation   . Subacute osteomyelitis, left ankle and foot ([place])   . Acute osteomyelitis of left calcaneus (HCC)   . Cutaneous abscess of left foot   . Severe protein-calorie malnutrition ([place])   . PAD (peripheral artery disease) ([place])   . Wound infection 10/02/2017   . Type 2 diabetes mellitus treated with insulin ([place])   . Tobacco use disorder   . Lymphedema 05/28/2017  . Essential hypertension 05/28/2017  . DM type 2 causing vascular disease ([place]) 05/28/2017  . DJD (degenerative joint disease) 05/28/2017  . Hyponatremia 04/05/2016  . Normocytic anemia 04/05/2016  . Gout 04/05/2016  . Bilateral foot pain 04/04/2016   PCP:  Vidal Schwalbe, MD Pharmacy:   [place], [place] - [address] [place] [address] Phone: [phone] Fax: [phone]     Social Determinants of Health (SDOH) Interventions    Readmission Risk Interventions No flowsheet data found.

## 2020-01-22 NOTE — Plan of Care (Signed)
Continue to monitor

## 2020-01-22 NOTE — Progress Notes (Signed)
Pt tested positive for Covid on 01/08/20. While in [facility] ED on 01/19/20, Opyd MD noted that due to pts improving condition and mild symptoms, the pt would not require isolation precautions or repeat testing. When transferred to 4E, pt was not started on isolation precautions. Flag in chart states that pt must be on airborne and contact precautions until 21 days after testing positive. At this time the pt has been started on isolation precautions but is not in a negative pressure room. Butch Penny RN, charge nurse, has reached out for infection prevention to be consulted on how to move forward.  Adella Hare, RN

## 2020-01-22 NOTE — Progress Notes (Signed)
PROGRESS NOTE    Tracie Lindbloom  YQI:[id number] DOB: 1960/07/27 DOA: 01/19/2020 PCP: Vidal Schwalbe, MD   Chief Complaint  Patient presents with   Leg Pain    Brief Narrative:    59 year old gentleman prior history of left BKA, recent COVID-19 pneumonia, diabetes mellitus, insulin-dependent presents with severe pain in the left BKA stump antibiotics.  He was admitted for acute left lower extremity ischemia underwent emergent iliofemoral embolectomy and is on IV heparin. Transitioned to oral eliquis.   Assessment & Plan:   Principal Problem:   Ischemic leg Active Problems:   Normocytic anemia   Essential hypertension   DM type 2 causing vascular disease (HCC)   Rhabdomyolysis   Lower limb ischemia   Acute left lower extremity ischemia Duplex shows distal BKA SFA occlusion Echocardiogram does not show any source of thrombus. S/p emergent embolectomy, left iliofemoral on 01/19/2020. Plan for revision to AKA versus oral anticoagulation.  Patient on IV heparin transition to oral Eliquis .  Patient would like to think about it. He reports severe pain in the left BKA Stump, RN reports he hasn't had pain meds since yesterday evening.  Recommend outpatient follow-up with vascular surgery in 2 to 3 weeks. PT eval recommending SNF, .   Mild hyponatremia: Pt asymptomatic. sodiuma t 133.    Rhabdomyolysis:  Improving.  CK at 3385.  Repeat CK levels in am improving.   Elevated liver enzymes:  Probably from rhabdomyolysis   Anemia of blood loss from the surgery superimposed on anemia of chronic disease Baseline hemoglobin around 9 currently at 8.3 and stable.  Transfuse to keep hemoglobin greater than 7.   Essential hypertension Blood pressure parameters are well controlled.   Type 2 diabetes mellitus CBG (last 3)  Recent Labs    01/22/20 0447 01/22/20 0828 01/22/20 1158  GLUCAP 180* 206* 276*   Continue with sliding scale insulin.and lantus. Increased the  lantus to 30 units and changed CBG'S to ACHS.  Last A1c is 9.8  Leukocytosis:  No source of infection.  Probably reactive. IMPROVING.      DVT prophylaxis: (heparin gtt.  Code Status: full code Family Communication: (none at bedside.  Disposition:   Status is: Inpatient  Remains inpatient appropriate because:Unsafe d/c plan   Dispo: The patient is from: Home              Anticipated d/c is to: SNF              Anticipated d/c date is: 1 day              Patient currently is not medically stable to d/c.       Consultants:   Vascular surgery.   Procedures:S/p emergent embolectomy, left iliofemoral on 01/19/2020.  Antimicrobials:none.    Subjective:  He reports severe pain in the left BKA Stump, RN reports he hasn't had pain meds since yesterday evening.   Objective: Vitals:   01/22/20 0459 01/22/20 0831 01/22/20 1054 01/22/20 1201  BP: 124/77 115/64 138/74 127/70  Pulse: 83 80 88 88  Resp: 19 18  18   Temp: 98.5 F (36.9 C) 98.6 F (37 C)  97.7 F (36.5 C)  TempSrc: Oral Oral  Tympanic  SpO2: 96% 92%  94%  Weight:        Intake/Output Summary (Last 24 hours) at 01/22/2020 1226 Last data filed at 01/22/2020 0835 Gross per 24 hour  Intake 360 ml  Output 500 ml  Net -140 ml   [employer]  01/19/20 1448  Weight: 118.8 kg    Examination:  General exam: Mild distress from leg pain Respiratory system: Clear to auscultation bilaterally, no wheezing or rhonchi Cardiovascular system: S1-S2 heard, irregularly irregular no JVD Gastrointestinal system: Abdomen is soft, nontender, nondistended bowel sounds normal. Central nervous system: Alert and oriented, grossly nonfocal Extremities: l left BKA Skin: No lesions seen Psychiatry: .  Mood is appropriate    Data Reviewed: I have personally reviewed following labs and imaging studies  CBC: Recent Labs  Lab 01/16/20 0633 01/19/20 1513 01/19/20 1543 01/19/20 2352 01/22/20 0035  WBC 21.8* 19.9*   --  16.1* 13.7*  NEUTROABS 16.6*  --   --   --  9.1*  HGB 8.9* 9.1* 9.2* 8.3* 8.3*  HCT 28.2* 28.9* 27.0* 26.2* 26.7*  MCV 82.9 86.5  --  83.4 86.7  PLT 422* 566*  --  493* 446*    Basic Metabolic Panel: Recent Labs  Lab 01/16/20 0633 01/16/20 0633 01/19/20 1513 01/19/20 1543 01/19/20 2352 01/21/20 0331 01/22/20 0035  NA 130*   < > 133* 134* 131* 133* 133*  K 4.2   < > 4.5 4.3 3.9 4.5 4.8  CL 95*   < > 98 100 99 100 100  CO2 24  --  23  --  24 25 25   GLUCOSE 191*   < > 145* 145* 147* 151* 147*  BUN 14   < > 16 16 12 10 8   CREATININE 0.54*   < > 0.58* 0.50* 0.64 0.63 0.63  CALCIUM 9.0  --  8.8*  --  8.4* 8.7* 8.4*  MG 2.1  --   --   --   --   --   --    < > = values in this interval not displayed.    GFR: Estimated Creatinine Clearance: 119.6 mL/min (by C-G formula based on SCr of 0.63 mg/dL).  Liver Function Tests: Recent Labs  Lab 01/16/20 0633  AST 54*  ALT 59*  ALKPHOS 117  BILITOT 0.4  PROT 8.2*  ALBUMIN 2.6*    CBG: Recent Labs  Lab 01/21/20 1952 01/21/20 2302 01/22/20 0447 01/22/20 0828 01/22/20 1158  GLUCAP 262* 173* 180* 206* 276*     No results found for this or any previous visit (from the past 240 hour(s)).       Radiology Studies: No results found.      Scheduled Meds:  amitriptyline  25 mg Oral QHS   amLODipine  10 mg Oral Daily   apixaban  10 mg Oral BID   Followed by   Derrill Memo ON 01/28/2020] apixaban  5 mg Oral BID   aspirin EC  81 mg Oral Q0600   docusate sodium  100 mg Oral Daily   insulin aspart  0-9 Units Subcutaneous Q4H   insulin glargine  20 Units Subcutaneous QHS   metoprolol succinate  25 mg Oral Daily   pantoprazole  40 mg Oral Daily   rosuvastatin  20 mg Oral Daily   Continuous Infusions:  sodium chloride     magnesium sulfate bolus IVPB       LOS: 2 days       Hosie Poisson, MD [facility]   To contact the attending provider between 7A-7P or the covering provider during  after hours 7P-7A, please log into the web site [email or web address] and access using universal [place] password for that web site. If you do not have the password, please call the hospital operator.  01/22/2020, 12:26 PM

## 2020-01-22 NOTE — Progress Notes (Signed)
Per Infection control, Patient does not need to be on precautions as patient is asymptomatic and out of 10 day window for covid.

## 2020-01-23 DIAGNOSIS — E1159 Type 2 diabetes mellitus with other circulatory complications: Secondary | ICD-10-CM | POA: Diagnosis not present

## 2020-01-23 DIAGNOSIS — I10 Essential (primary) hypertension: Secondary | ICD-10-CM | POA: Diagnosis not present

## 2020-01-23 DIAGNOSIS — I998 Other disorder of circulatory system: Secondary | ICD-10-CM | POA: Diagnosis not present

## 2020-01-23 DIAGNOSIS — D649 Anemia, unspecified: Secondary | ICD-10-CM | POA: Diagnosis not present

## 2020-01-23 LAB — BASIC METABOLIC PANEL
Anion gap: 10 (ref 5–15)
BUN: 7 mg/dL — ABNORMAL LOW (ref 8–23)
CO2: 24 mmol/L (ref 22–32)
Calcium: 8.6 mg/dL — ABNORMAL LOW (ref 8.9–10.3)
Chloride: 98 mmol/L (ref 98–111)
Creatinine, Ser: 0.64 mg/dL (ref 0.61–1.24)
GFR, Estimated: >60 mL/min (ref >60)
Glucose, Bld: 220 mg/dL — ABNORMAL HIGH (ref 70–99)
Potassium: 3.9 mmol/L (ref 3.5–5.1)
Sodium: 132 mmol/L — ABNORMAL LOW (ref 135–145)

## 2020-01-23 LAB — GLUCOSE, CAPILLARY
Glucose-Capillary: 193 mg/dL — ABNORMAL HIGH (ref 70–99)
Glucose-Capillary: 250 mg/dL — ABNORMAL HIGH (ref 70–99)
Glucose-Capillary: 230 mg/dL — ABNORMAL HIGH (ref 70–99)
Glucose-Capillary: 247 mg/dL — ABNORMAL HIGH (ref 70–99)
Glucose-Capillary: 213 mg/dL — ABNORMAL HIGH (ref 70–99)
Glucose-Capillary: 211 mg/dL — ABNORMAL HIGH (ref 70–99)

## 2020-01-23 MED ORDER — BISACODYL 10 MG RE SUPP
10.0000 mg | Freq: Every day | RECTAL | Status: DC | PRN
  Administered 2020-01-23: 10 mg via RECTAL
  Filled 2020-01-23: qty 1

## 2020-01-23 MED ORDER — INSULIN ASPART 100 UNIT/ML ~~LOC~~ SOLN
0.0000 [IU] | Freq: Three times a day (TID) | SUBCUTANEOUS | Status: DC
  Administered 2020-01-23 – 2020-01-24 (×2): 5 [IU] via SUBCUTANEOUS
  Administered 2020-01-24: 9 [IU] via SUBCUTANEOUS
  Administered 2020-01-24: 8 [IU] via SUBCUTANEOUS
  Administered 2020-01-25: 5 [IU] via SUBCUTANEOUS
  Administered 2020-01-25: 3 [IU] via SUBCUTANEOUS
  Administered 2020-01-25 – 2020-01-26 (×2): 5 [IU] via SUBCUTANEOUS
  Administered 2020-01-26: 3 [IU] via SUBCUTANEOUS
  Administered 2020-01-26: 2 [IU] via SUBCUTANEOUS
  Administered 2020-01-27: 3 [IU] via SUBCUTANEOUS
  Administered 2020-01-27: 8 [IU] via SUBCUTANEOUS
  Administered 2020-01-27: 5 [IU] via SUBCUTANEOUS
  Administered 2020-01-28: 3 [IU] via SUBCUTANEOUS
  Administered 2020-01-28: 1 [IU] via SUBCUTANEOUS
  Administered 2020-01-28: 5 [IU] via SUBCUTANEOUS
  Administered 2020-01-29: 11 [IU] via SUBCUTANEOUS
  Administered 2020-01-29 – 2020-01-30 (×2): 5 [IU] via SUBCUTANEOUS
  Administered 2020-01-30: 11 [IU] via SUBCUTANEOUS
  Administered 2020-01-30 – 2020-01-31 (×2): 5 [IU] via SUBCUTANEOUS
  Administered 2020-01-31: 8 [IU] via SUBCUTANEOUS
  Administered 2020-01-31: 5 [IU] via SUBCUTANEOUS
  Administered 2020-02-01: 3 [IU] via SUBCUTANEOUS
  Administered 2020-02-01: 5 [IU] via SUBCUTANEOUS

## 2020-01-23 MED ORDER — INSULIN ASPART 100 UNIT/ML ~~LOC~~ SOLN
0.0000 [IU] | Freq: Every day | SUBCUTANEOUS | Status: DC
  Administered 2020-01-23: 2 [IU] via SUBCUTANEOUS
  Administered 2020-01-24 – 2020-01-26 (×3): 3 [IU] via SUBCUTANEOUS
  Administered 2020-01-27 – 2020-01-28 (×2): 2 [IU] via SUBCUTANEOUS
  Administered 2020-01-29: 4 [IU] via SUBCUTANEOUS
  Administered 2020-01-30 – 2020-01-31 (×2): 3 [IU] via SUBCUTANEOUS

## 2020-01-23 MED ORDER — INSULIN GLARGINE 100 UNIT/ML ~~LOC~~ SOLN
38.0000 [IU] | Freq: Every day | SUBCUTANEOUS | Status: DC
  Administered 2020-01-23: 38 [IU] via SUBCUTANEOUS
  Filled 2020-01-23 (×2): qty 0.38

## 2020-01-23 NOTE — Progress Notes (Addendum)
[facility]  Subjective  - Still having pain in the left BK stump.   Objective 116/79 76 98.1 F (36.7 C) (Oral) 18 94%  Intake/Output Summary (Last 24 hours) at 01/23/2020 0721 Last data filed at 01/23/2020 0512 Gross per 24 hour  Intake 240 ml  Output 500 ml  Net -260 ml    Left BK stump cold to touch with decreased sensation. Left groin soft incision intact. Lungs non labored breathing  Assessment/Planning: slp left ileofemoral thrombectomy. Chronic left residual limb pain.  Continue Eliquis daily.  He is not interested in revision of left BK to AK at this time.  Roxy Horseman 01/23/2020 7:21 AM --   Agree with above.  Will recheck Monday if not discharged.  Otherwise will see him in follow up in a few weeks  [name], MD [facility] [place]: [phone]  Laboratory Lab Results: Recent Labs    01/22/20 0035  WBC 13.7*  HGB 8.3*  HCT 26.7*  PLT 446*   BMET Recent Labs    01/22/20 0035 01/23/20 0101  NA 133* 132*  K 4.8 3.9  CL 100 98  CO2 25 24  GLUCOSE 147* 220*  BUN 8 7*  CREATININE 0.63 0.64  CALCIUM 8.4* 8.6*    COAG Lab Results  Component Value Date   INR 1.3 (H) 01/09/2020   No results found for: PTT

## 2020-01-23 NOTE — TOC Progression Note (Signed)
Transition of Care [facility]) - Progression Note    Patient Details  Name: [name] MRN: [id number] Date of Birth: [birth year]  Transition of Care [facility]) CM/SW [place], [place] Phone Number: 01/23/2020, 4:06 PM  Clinical Narrative:     Patient has no bed offers   [facility] reviewing- waiting on response  Thurmond Butts, MSW, [place] Social Worker   Expected Discharge Plan: [place] Barriers to Discharge: Continued Medical Work up, SNF Pending bed offer  Expected Discharge Plan and Services Expected Discharge Plan: [place] In-house Referral: Clinical Social Work     Living arrangements for the past 2 months: Single Family Home                                       Social Determinants of Health (SDOH) Interventions    Readmission Risk Interventions No flowsheet data found.

## 2020-01-23 NOTE — Progress Notes (Signed)
PROGRESS NOTE    [name]  UXN:[id number] DOB: 1961/01/11 DOA: 01/19/2020 PCP: Vidal Schwalbe, MD   Chief Complaint  Patient presents with  . Leg Pain    Brief Narrative:    59 year old gentleman prior history of left BKA, recent COVID-19 pneumonia, diabetes mellitus, insulin-dependent presents with severe pain in the left BKA stump antibiotics.  He was admitted for acute left lower extremity ischemia underwent emergent iliofemoral embolectomy and is on IV heparin. Transitioned to oral eliquis.  Patient reports persistent pain in the left BKA stump but refuses to have a revision AKA.  Assessment & Plan:   Principal Problem:   Ischemic leg Active Problems:   Normocytic anemia   Essential hypertension   DM type 2 causing vascular disease (HCC)   Rhabdomyolysis   Lower limb ischemia   Acute left lower extremity ischemia Duplex shows distal BKA SFA occlusion Echocardiogram does not show any source of thrombus. S/p emergent embolectomy, left iliofemoral on 01/19/2020. Plan for revision to AKA versus oral anticoagulation.  Patient on IV heparin transition to oral Eliquis .  Patient would like to think about it. He reports severe pain in the left BKA Stump, RN reports he hasn't had pain meds since yesterday evening.  Recommend outpatient follow-up with vascular surgery in 2 to 3 weeks. PT eval recommending SNF, .  Currently waiting for placement  Mild hyponatremia: Pt asymptomatic. sodiuma t 133.    Rhabdomyolysis:  Repeat levels are improving  Elevated liver enzymes:  Probably from rhabdomyolysis   Anemia of blood loss from the surgery superimposed on anemia of chronic disease Baseline hemoglobin around 9 currently at 8.3 and stable.  Transfuse to keep hemoglobin greater than 7.   Essential hypertension Blood pressure parameters are well controlled.   Type 2 diabetes mellitus CBG (last 3)  Recent Labs    01/23/20 0435 01/23/20 0818 01/23/20 1126    GLUCAP 250* 230* 247*   Increase Lantus to 38 units, increase to moderate sliding scale insulin,    Leukocytosis:  No source of infection.  Probably reactive     DVT prophylaxis: (heparin gtt.  Code Status: full code Family Communication: (none at bedside.  Disposition:   Status is: Inpatient  Remains inpatient appropriate because:Unsafe d/c plan   Dispo: The patient is from: Home              Anticipated d/c is to: SNF              Anticipated d/c date is: 1 day              Patient currently is not medically stable to d/c.       Consultants:   Vascular surgery.   Procedures:S/p emergent embolectomy, left iliofemoral on 01/19/2020.  Antimicrobials:none.    Subjective:  Persistent pain in the left BKA stump,  Objective: Vitals:   01/22/20 2313 01/23/20 0424 01/23/20 0757 01/23/20 1129  BP: 113/61 116/79 107/62 120/66  Pulse: 84 76 81 78  Resp: 18 18  18   Temp: 98.3 F (36.8 C) 98.1 F (36.7 C) 98.7 F (37.1 C) 98.2 F (36.8 C)  TempSrc: Oral Oral Oral Oral  SpO2: 92% 94% 92% 92%  Weight:        Intake/Output Summary (Last 24 hours) at 01/23/2020 1509 Last data filed at 01/23/2020 1130 Gross per 24 hour  Intake 120 ml  Output 1100 ml  Net -980 ml   Filed Weights   01/19/20 1448  Weight: 118.8 kg  Examination:  General exam: Mild distress from pain in the stump Respiratory system: Clear to auscultation bilaterally, no wheezing or rhonchi Cardiovascular system: S1-S2 heard, regular rate rhythm, no JVD Gastrointestinal system: Abdomen is soft, nontender nondistended bowel sounds normal Central nervous system: Alert and oriented, grossly nonfocal Extremities: Left BKA Skin: No ulcers seen Psychiatry: .  Mood is appropriate   Data Reviewed: I have personally reviewed following labs and imaging studies  CBC: Recent Labs  Lab 01/19/20 1513 01/19/20 1543 01/19/20 2352 01/22/20 0035  WBC 19.9*  --  16.1* 13.7*  NEUTROABS  --    --   --  9.1*  HGB 9.1* 9.2* 8.3* 8.3*  HCT 28.9* 27.0* 26.2* 26.7*  MCV 86.5  --  83.4 86.7  PLT 566*  --  493* 446*    Basic Metabolic Panel: Recent Labs  Lab 01/19/20 1513 01/19/20 1513 01/19/20 1543 01/19/20 2352 01/21/20 0331 01/22/20 0035 01/23/20 0101  NA 133*   < > 134* 131* 133* 133* 132*  K 4.5   < > 4.3 3.9 4.5 4.8 3.9  CL 98   < > 100 99 100 100 98  CO2 23  --   --  24 25 25 24   GLUCOSE 145*   < > 145* 147* 151* 147* 220*  BUN 16   < > 16 12 10 8  7*  CREATININE 0.58*   < > 0.50* 0.64 0.63 0.63 0.64  CALCIUM 8.8*  --   --  8.4* 8.7* 8.4* 8.6*   < > = values in this interval not displayed.    GFR: Estimated Creatinine Clearance: 119.6 mL/min (by C-G formula based on SCr of 0.64 mg/dL).  Liver Function Tests: No results for input(s): AST, ALT, ALKPHOS, BILITOT, PROT, ALBUMIN in the last 168 hours.  CBG: Recent Labs  Lab 01/22/20 2024 01/22/20 2312 01/23/20 0435 01/23/20 0818 01/23/20 1126  GLUCAP 213* 193* 250* 230* 247*     No results found for this or any previous visit (from the past 240 hour(s)).       Radiology Studies: No results found.      Scheduled Meds: . amitriptyline  25 mg Oral QHS  . amLODipine  10 mg Oral Daily  . apixaban  10 mg Oral BID   Followed by  . [START ON 01/28/2020] apixaban  5 mg Oral BID  . aspirin EC  81 mg Oral Q0600  . docusate sodium  100 mg Oral Daily  . insulin aspart  0-15 Units Subcutaneous TID WC  . insulin aspart  0-5 Units Subcutaneous QHS  . insulin glargine  38 Units Subcutaneous QHS  . metoprolol succinate  25 mg Oral Daily  . pantoprazole  40 mg Oral Daily  . rosuvastatin  20 mg Oral Daily   Continuous Infusions: . sodium chloride    . magnesium sulfate bolus IVPB       LOS: 3 days       Hosie Poisson, MD [facility]   To contact the attending provider between 7A-7P or the covering provider during after hours 7P-7A, please log into the web site [email or web address] and access  using universal [place] password for that web site. If you do not have the password, please call the hospital operator.  01/23/2020, 3:09 PM

## 2020-01-23 NOTE — Progress Notes (Signed)
Inpatient Diabetes Program Recommendations  AACE/ADA: New Consensus Statement on Inpatient Glycemic Control (2015)  Target Ranges:  Prepandial:   less than 140 mg/dL      Peak postprandial:   less than 180 mg/dL (1-2 hours)      Critically ill patients:  140 - 180 mg/dL   Lab Results  Component Value Date   GLUCAP 230 (H) 01/23/2020   HGBA1C 9.8 (H) 01/08/2020    Review of Glycemic Control Results for [name], [name] (MRN [id number]) as of 01/23/2020 10:22  Ref. Range 01/22/2020 08:28 01/22/2020 11:58 01/22/2020 16:42 01/22/2020 20:24 01/22/2020 23:12 01/23/2020 04:35 01/23/2020 08:18  Glucose-Capillary Latest Ref Range: 70 - 99 mg/dL 206 (H) 276 (H) 243 (H) 213 (H) 193 (H) 250 (H) 230 (H)   Diabetes history: DM 2 Outpatient Diabetes medications: Glipizide 10 mg Daily, Novolog 24-30 units tid, Lantus 100 units qhs, Metformin 500 mg bid Current orders for Inpatient glycemic control:  Lantus 30 units Novolog 0-9 units Q4 hours  A1c 9.8% on 10/7, DM coordinator spoke with pt at that time  Inpatient Diabetes Program Recommendations:    -  Consider increasing Lantus to 40 units  Thanks,  Tama Headings RN, MSN, BC-ADM Inpatient Diabetes Coordinator Team Pager [phone] (8a-5p)

## 2020-01-24 DIAGNOSIS — I10 Essential (primary) hypertension: Secondary | ICD-10-CM | POA: Diagnosis not present

## 2020-01-24 DIAGNOSIS — E1159 Type 2 diabetes mellitus with other circulatory complications: Secondary | ICD-10-CM | POA: Diagnosis not present

## 2020-01-24 DIAGNOSIS — I998 Other disorder of circulatory system: Secondary | ICD-10-CM | POA: Diagnosis not present

## 2020-01-24 DIAGNOSIS — D649 Anemia, unspecified: Secondary | ICD-10-CM | POA: Diagnosis not present

## 2020-01-24 LAB — GLUCOSE, CAPILLARY
Glucose-Capillary: 207 mg/dL — ABNORMAL HIGH (ref 70–99)
Glucose-Capillary: 305 mg/dL — ABNORMAL HIGH (ref 70–99)
Glucose-Capillary: 276 mg/dL — ABNORMAL HIGH (ref 70–99)
Glucose-Capillary: 283 mg/dL — ABNORMAL HIGH (ref 70–99)

## 2020-01-24 MED ORDER — INSULIN GLARGINE 100 UNIT/ML ~~LOC~~ SOLN
42.0000 [IU] | Freq: Every day | SUBCUTANEOUS | Status: DC
  Filled 2020-01-24: qty 0.42

## 2020-01-24 MED ORDER — INSULIN GLARGINE 100 UNIT/ML ~~LOC~~ SOLN
45.0000 [IU] | Freq: Every day | SUBCUTANEOUS | Status: DC
  Administered 2020-01-24 – 2020-01-26 (×3): 45 [IU] via SUBCUTANEOUS
  Filled 2020-01-24 (×4): qty 0.45

## 2020-01-24 MED ORDER — INSULIN ASPART 100 UNIT/ML ~~LOC~~ SOLN
2.0000 [IU] | Freq: Three times a day (TID) | SUBCUTANEOUS | Status: DC
  Administered 2020-01-24 – 2020-01-27 (×10): 2 [IU] via SUBCUTANEOUS

## 2020-01-24 NOTE — Progress Notes (Signed)
PROGRESS NOTE    [name]  TIW:[id number] DOB: November 18, 1960 DOA: 01/19/2020 PCP: Vidal Schwalbe, MD   Chief Complaint  Patient presents with  . Leg Pain    Brief Narrative:    59 year old gentleman prior history of left BKA, recent COVID-19 pneumonia, diabetes mellitus, insulin-dependent presents with severe pain in the left BKA stump antibiotics.  He was admitted for acute left lower extremity ischemia underwent emergent iliofemoral embolectomy and is on IV heparin. Transitioned to oral eliquis.  Patient reports persistent pain in the left BKA stump but refuses to have a revision AKA. Patient seen and examined at bedside no new complaints currently waiting for an SNF pending bed offer.  Assessment & Plan:   Principal Problem:   Ischemic leg Active Problems:   Normocytic anemia   Essential hypertension   DM type 2 causing vascular disease (HCC)   Rhabdomyolysis   Lower limb ischemia   Acute left lower extremity ischemia Duplex shows distal BKA SFA occlusion Echocardiogram does not show any source of thrombus. S/p emergent embolectomy, left iliofemoral on 01/19/2020. Plan for revision to AKA versus oral anticoagulation.  Patient on IV heparin transition to oral Eliquis for discharge Recommend outpatient follow-up with vascular surgery in 2 to 3 weeks. PT eval recommending SNF, .  Currently waiting for placement. No new complaints today continue with pain medication for the pain in the left BKA stump  Mild hyponatremia: Pt asymptomatic. sodiuma t 132.    Rhabdomyolysis:  Repeat CK levels around thousand  Elevated liver enzymes:  Probably from rhabdomyolysis. She currently denies any nausea or abdominal pain or vomiting   Anemia of blood loss from the surgery superimposed on anemia of chronic disease Baseline hemoglobin around 9 currently at 8.3 and stable.  Transfuse to keep hemoglobin greater than 7.   Essential hypertension Blood pressure parameters  are optimal   Type 2 diabetes mellitus CBG (last 3)  Recent Labs    01/23/20 2134 01/24/20 0613 01/24/20 1112  GLUCAP 211* 207* 305*   Increase Lantus to 45 units,  change sliding scale to moderate SSI and added NovoLog 2 units 3 times daily AC.     Leukocytosis:  No source of infection.  Probably reactive     DVT prophylaxis: (heparin gtt.  Code Status: full code Family Communication: (none at bedside.  Disposition:   Status is: Inpatient  Remains inpatient appropriate because:Unsafe d/c plan   Dispo: The patient is from: Home              Anticipated d/c is to: SNF              Anticipated d/c date is: 1 day              Patient currently is medically stable to d/c.       Consultants:   Vascular surgery.   Procedures:S/p emergent embolectomy, left iliofemoral on 01/19/2020.  Antimicrobials:none.    Subjective:   Objective: Vitals:   01/23/20 2351 01/24/20 0413 01/24/20 0831 01/24/20 1100  BP: 123/67 130/72 116/71 128/73  Pulse: 98 95 100 94  Resp: 20 20 18 16   Temp: 98.7 F (37.1 C) 98.7 F (37.1 C) 99.3 F (37.4 C) 98.5 F (36.9 C)  TempSrc: Oral Oral Oral Oral  SpO2: 94% 92% 91% 99%  Weight:  98.6 kg      Intake/Output Summary (Last 24 hours) at 01/24/2020 1210 Last data filed at 01/24/2020 1107 Gross per 24 hour  Intake 1660 ml  Output 3125  ml  Net -1465 ml   Filed Weights   01/19/20 1448 01/24/20 0413  Weight: 118.8 kg 98.6 kg    Examination:  General exam: Alert and comfortable.  Respiratory system: clear to auscultation, no wheezing or rhonchi.  Cardiovascular system: S1-S2 heard, RRR or JVD.  Gastrointestinal system: Abdomen is soft, NT ND BS+ Central nervous system:ALERT and oriented, grossly non focal  Extremities: Left BKA, cold to touch.  Skin: No lesions seen.  Psychiatry: .  Mood is appropriate   Data Reviewed: I have personally reviewed following labs and imaging studies  CBC: Recent Labs  Lab  01/19/20 1513 01/19/20 1543 01/19/20 2352 01/22/20 0035  WBC 19.9*  --  16.1* 13.7*  NEUTROABS  --   --   --  9.1*  HGB 9.1* 9.2* 8.3* 8.3*  HCT 28.9* 27.0* 26.2* 26.7*  MCV 86.5  --  83.4 86.7  PLT 566*  --  493* 446*    Basic Metabolic Panel: Recent Labs  Lab 01/19/20 1513 01/19/20 1513 01/19/20 1543 01/19/20 2352 01/21/20 0331 01/22/20 0035 01/23/20 0101  NA 133*   < > 134* 131* 133* 133* 132*  K 4.5   < > 4.3 3.9 4.5 4.8 3.9  CL 98   < > 100 99 100 100 98  CO2 23  --   --  24 25 25 24   GLUCOSE 145*   < > 145* 147* 151* 147* 220*  BUN 16   < > 16 12 10 8  7*  CREATININE 0.58*   < > 0.50* 0.64 0.63 0.63 0.64  CALCIUM 8.8*  --   --  8.4* 8.7* 8.4* 8.6*   < > = values in this interval not displayed.    GFR: Estimated Creatinine Clearance: 108.5 mL/min (by C-G formula based on SCr of 0.64 mg/dL).  Liver Function Tests: No results for input(s): AST, ALT, ALKPHOS, BILITOT, PROT, ALBUMIN in the last 168 hours.  CBG: Recent Labs  Lab 01/23/20 1126 01/23/20 1605 01/23/20 2134 01/24/20 0613 01/24/20 1112  GLUCAP 247* 213* 211* 207* 305*     No results found for this or any previous visit (from the past 240 hour(s)).       Radiology Studies: No results found.      Scheduled Meds: . amitriptyline  25 mg Oral QHS  . amLODipine  10 mg Oral Daily  . apixaban  10 mg Oral BID   Followed by  . [START ON 01/28/2020] apixaban  5 mg Oral BID  . aspirin EC  81 mg Oral Q0600  . docusate sodium  100 mg Oral Daily  . insulin aspart  0-15 Units Subcutaneous TID WC  . insulin aspart  0-5 Units Subcutaneous QHS  . insulin aspart  2 Units Subcutaneous TID WC  . insulin glargine  42 Units Subcutaneous QHS  . metoprolol succinate  25 mg Oral Daily  . pantoprazole  40 mg Oral Daily  . rosuvastatin  20 mg Oral Daily   Continuous Infusions: . sodium chloride    . magnesium sulfate bolus IVPB       LOS: 4 days       Hosie Poisson, MD [facility]   To contact the attending provider between 7A-7P or the covering provider during after hours 7P-7A, please log into the web site [email or web address] and access using universal [place] password for that web site. If you do not have the password, please call the hospital operator.  01/24/2020, 12:10 PM

## 2020-01-24 NOTE — TOC Progression Note (Signed)
Transition of Care [facility]) - Progression Note    Patient Details  Name: [name] MRN: [id number] Date of Birth: [birth year]  Transition of Care [facility]) CM/SW [place], [place] Phone Number: 01/24/2020, 11:09 AM  Clinical Narrative:    [place] Spikes checking patient's Medicaid status.    Expected Discharge Plan: [place] Barriers to Discharge: Continued Medical Work up, SNF Pending bed offer  Expected Discharge Plan and Services Expected Discharge Plan: [place] In-house Referral: Clinical Social Work     Living arrangements for the past 2 months: Single Family Home                                       Social Determinants of Health (SDOH) Interventions    Readmission Risk Interventions No flowsheet data found.

## 2020-01-25 DIAGNOSIS — I998 Other disorder of circulatory system: Secondary | ICD-10-CM | POA: Diagnosis not present

## 2020-01-25 DIAGNOSIS — E1159 Type 2 diabetes mellitus with other circulatory complications: Secondary | ICD-10-CM | POA: Diagnosis not present

## 2020-01-25 LAB — GLUCOSE, CAPILLARY
Glucose-Capillary: 166 mg/dL — ABNORMAL HIGH (ref 70–99)
Glucose-Capillary: 221 mg/dL — ABNORMAL HIGH (ref 70–99)
Glucose-Capillary: 202 mg/dL — ABNORMAL HIGH (ref 70–99)
Glucose-Capillary: 256 mg/dL — ABNORMAL HIGH (ref 70–99)

## 2020-01-25 NOTE — Progress Notes (Signed)
PROGRESS NOTE    [name]  ZOX:[id number] DOB: [birth year] DOA: 01/19/2020 PCP: Vidal Schwalbe, MD   Chief Complaint  Patient presents with  . Leg Pain    Brief Narrative:    59 year old gentleman prior history of left BKA, recent COVID-19 pneumonia, diabetes mellitus, insulin-dependent presents with severe pain in the left BKA stump antibiotics.  He was admitted for acute left lower extremity ischemia underwent emergent iliofemoral embolectomy and is on IV heparin. Transitioned to oral eliquis.  Patient reports persistent pain in the left BKA stump but refuses to have a revision AKA. Patient seen and examined at bedside. Patient reports his left BKA stump pain is tolerable with pain medication. Currently waiting for SNF  Assessment & Plan:   Principal Problem:   Ischemic leg Active Problems:   Normocytic anemia   Essential hypertension   DM type 2 causing vascular disease (HCC)   Rhabdomyolysis   Lower limb ischemia   Acute left lower extremity ischemia Duplex shows distal BKA SFA occlusion Echocardiogram does not show any source of thrombus. S/p emergent embolectomy, left iliofemoral on 01/19/2020. Plan for revision to AKA versus oral anticoagulation.  Patient on IV heparin transition to oral Eliquis for discharge Recommend outpatient follow-up with vascular surgery in 2 to 3 weeks. PT eval recommending SNF, .  Currently waiting for placement. No new complaints today continue with pain medication for the pain in the left BKA stump  Mild hyponatremia: Pt asymptomatic. sodiuma t 132.    Rhabdomyolysis:  Repeat CK levels around thousand  Elevated liver enzymes:  Probably from rhabdomyolysis. Patient denies any nausea vomiting or abdominal pain   Anemia of blood loss from the surgery superimposed on anemia of chronic disease Baseline hemoglobin around 9 currently at 8.3 and stable.  Transfuse to keep hemoglobin greater than 7.   Essential  hypertension Blood pressure parameters are optimal   Type 2 diabetes mellitus CBG (last 3)  Recent Labs    01/24/20 2103 01/25/20 0602 01/25/20 1126  GLUCAP 283* 166* 221*   Increase Lantus to 45 units,  change sliding scale to moderate SSI and added NovoLog 2 units 3 times daily AC. Changes in medications    Leukocytosis:  No source of infection.  Probably reactive  Constipation Stool softeners and laxatives ordered.   DVT prophylaxis: Eliquis Code Status: full code Family Communication: (none at bedside.  Disposition:   Status is: Inpatient  Remains inpatient appropriate because:Unsafe d/c plan   Dispo: The patient is from: Home              Anticipated d/c is to: SNF              Anticipated d/c date is: 1 day              Patient currently is medically stable to d/c.       Consultants:   Vascular surgery.   Procedures:S/p emergent embolectomy, left iliofemoral on 01/19/2020.  Antimicrobials:none.    Subjective: No new complaints  Objective: Vitals:   01/25/20 0026 01/25/20 0502 01/25/20 0846 01/25/20 1127  BP: 125/77 120/73 127/75 129/79  Pulse: 93 93 96 94  Resp: 18     Temp: 99 F (37.2 C) 100 F (37.8 C) 98.3 F (36.8 C) 98.4 F (36.9 C)  TempSrc: Oral Oral Oral Oral  SpO2: 97% 94% 96% 95%  Weight:      Height:        Intake/Output Summary (Last 24 hours) at 01/25/2020 1411 Last data  filed at 01/25/2020 0846 Gross per 24 hour  Intake 240 ml  Output 3050 ml  Net -2810 ml   Filed Weights   01/19/20 1448 01/24/20 0413 01/24/20 1551  Weight: 118.8 kg 98.6 kg 98.6 kg    Examination:  General exam: Alert and comfortable Respiratory system: Clear to auscultation bilaterally, no wheezing Cardiovascular system: S1-S2 heard regular rate rhythm, no JVD Gastrointestinal system: Abdomen is soft, nontender, nondistended bowel sounds normal Central nervous system: Alert and oriented, grossly nonfocal Extremities: Left BKA, cold to  touch.  Skin: No ulcers seen Psychiatry: .  Mood is appropriate   Data Reviewed: I have personally reviewed following labs and imaging studies  CBC: Recent Labs  Lab 01/19/20 1513 01/19/20 1543 01/19/20 2352 01/22/20 0035  WBC 19.9*  --  16.1* 13.7*  NEUTROABS  --   --   --  9.1*  HGB 9.1* 9.2* 8.3* 8.3*  HCT 28.9* 27.0* 26.2* 26.7*  MCV 86.5  --  83.4 86.7  PLT 566*  --  493* 446*    Basic Metabolic Panel: Recent Labs  Lab 01/19/20 1513 01/19/20 1513 01/19/20 1543 01/19/20 2352 01/21/20 0331 01/22/20 0035 01/23/20 0101  NA 133*   < > 134* 131* 133* 133* 132*  K 4.5   < > 4.3 3.9 4.5 4.8 3.9  CL 98   < > 100 99 100 100 98  CO2 23  --   --  24 25 25 24   GLUCOSE 145*   < > 145* 147* 151* 147* 220*  BUN 16   < > 16 12 10 8  7*  CREATININE 0.58*   < > 0.50* 0.64 0.63 0.63 0.64  CALCIUM 8.8*  --   --  8.4* 8.7* 8.4* 8.6*   < > = values in this interval not displayed.    GFR: Estimated Creatinine Clearance: 108.5 mL/min (by C-G formula based on SCr of 0.64 mg/dL).  Liver Function Tests: No results for input(s): AST, ALT, ALKPHOS, BILITOT, PROT, ALBUMIN in the last 168 hours.  CBG: Recent Labs  Lab 01/24/20 1112 01/24/20 1708 01/24/20 2103 01/25/20 0602 01/25/20 1126  GLUCAP 305* 276* 283* 166* 221*     No results found for this or any previous visit (from the past 240 hour(s)).       Radiology Studies: No results found.      Scheduled Meds: . amitriptyline  25 mg Oral QHS  . amLODipine  10 mg Oral Daily  . apixaban  10 mg Oral BID   Followed by  . [START ON 01/28/2020] apixaban  5 mg Oral BID  . aspirin EC  81 mg Oral Q0600  . docusate sodium  100 mg Oral Daily  . insulin aspart  0-15 Units Subcutaneous TID WC  . insulin aspart  0-5 Units Subcutaneous QHS  . insulin aspart  2 Units Subcutaneous TID WC  . insulin glargine  45 Units Subcutaneous QHS  . metoprolol succinate  25 mg Oral Daily  . pantoprazole  40 mg Oral Daily  .  rosuvastatin  20 mg Oral Daily   Continuous Infusions: . sodium chloride    . magnesium sulfate bolus IVPB       LOS: 5 days       Hosie Poisson, MD [facility]   To contact the attending provider between 7A-7P or the covering provider during after hours 7P-7A, please log into the web site [email or web address] and access using universal [place] password for that web site. If you do  not have the password, please call the hospital operator.  01/25/2020, 2:11 PM

## 2020-01-26 DIAGNOSIS — E1159 Type 2 diabetes mellitus with other circulatory complications: Secondary | ICD-10-CM | POA: Diagnosis not present

## 2020-01-26 DIAGNOSIS — D649 Anemia, unspecified: Secondary | ICD-10-CM | POA: Diagnosis not present

## 2020-01-26 DIAGNOSIS — I998 Other disorder of circulatory system: Secondary | ICD-10-CM | POA: Diagnosis not present

## 2020-01-26 DIAGNOSIS — I10 Essential (primary) hypertension: Secondary | ICD-10-CM | POA: Diagnosis not present

## 2020-01-26 LAB — GLUCOSE, CAPILLARY
Glucose-Capillary: 187 mg/dL — ABNORMAL HIGH (ref 70–99)
Glucose-Capillary: 247 mg/dL — ABNORMAL HIGH (ref 70–99)
Glucose-Capillary: 198 mg/dL — ABNORMAL HIGH (ref 70–99)
Glucose-Capillary: 281 mg/dL — ABNORMAL HIGH (ref 70–99)

## 2020-01-26 LAB — BASIC METABOLIC PANEL
Anion gap: 11 (ref 5–15)
BUN: 10 mg/dL (ref 8–23)
CO2: 23 mmol/L (ref 22–32)
Calcium: 8.8 mg/dL — ABNORMAL LOW (ref 8.9–10.3)
Chloride: 97 mmol/L — ABNORMAL LOW (ref 98–111)
Creatinine, Ser: 0.68 mg/dL (ref 0.61–1.24)
GFR, Estimated: >60 mL/min (ref >60)
Glucose, Bld: 183 mg/dL — ABNORMAL HIGH (ref 70–99)
Potassium: 4.4 mmol/L (ref 3.5–5.1)
Sodium: 131 mmol/L — ABNORMAL LOW (ref 135–145)

## 2020-01-26 LAB — CBC
HCT: 24.9 % — ABNORMAL LOW (ref 39.0–52.0)
Hemoglobin: 7.8 g/dL — ABNORMAL LOW (ref 13.0–17.0)
MCH: 26.3 pg (ref 26.0–34.0)
MCHC: 31.3 g/dL (ref 30.0–36.0)
MCV: 83.8 fL (ref 80.0–100.0)
Platelets: 675 10*3/uL — ABNORMAL HIGH (ref 150–400)
RBC: 2.97 MIL/uL — ABNORMAL LOW (ref 4.22–5.81)
RDW: 14.2 % (ref 11.5–15.5)
WBC: 15.4 10*3/uL — ABNORMAL HIGH (ref 4.0–10.5)
nRBC: 0 % (ref 0.0–0.2)

## 2020-01-26 LAB — HEPARIN LEVEL (UNFRACTIONATED): Heparin Unfractionated: 2.02 IU/mL — ABNORMAL HIGH (ref 0.30–0.70)

## 2020-01-26 LAB — APTT: aPTT: 48 seconds — ABNORMAL HIGH (ref 24–36)

## 2020-01-26 MED ORDER — HEPARIN (PORCINE) 25000 UT/250ML-% IV SOLN
2450.0000 [IU]/h | INTRAVENOUS | Status: AC
  Administered 2020-01-26 – 2020-01-27 (×2): 2000 [IU]/h via INTRAVENOUS
  Administered 2020-01-27: 2100 [IU]/h via INTRAVENOUS
  Administered 2020-01-28 (×2): 2450 [IU]/h via INTRAVENOUS
  Filled 2020-01-26 (×5): qty 250

## 2020-01-26 MED ORDER — CEPHALEXIN 500 MG PO CAPS
500.0000 mg | ORAL_CAPSULE | Freq: Three times a day (TID) | ORAL | Status: DC
  Administered 2020-01-26 – 2020-01-30 (×14): 500 mg via ORAL
  Filled 2020-01-26 (×15): qty 1

## 2020-01-26 MED ORDER — ZOLPIDEM TARTRATE 5 MG PO TABS
5.0000 mg | ORAL_TABLET | Freq: Once | ORAL | Status: AC
  Administered 2020-01-26: 5 mg via ORAL
  Filled 2020-01-26: qty 1

## 2020-01-26 NOTE — Progress Notes (Signed)
[place] for heparin  Indication: ischemic limb, multiple occluded LLE arteries  No Known Allergies  Patient Measurements: Height: 5\' 7"  (170.2 cm) Weight: 98.6 kg (217 lb 6 oz) IBW/kg (Calculated) : 66.1 HEPARIN DW (KG): 87.4   Vital Signs: Temp: 97.4 F (36.3 C) (10/25 0418) Temp Source: Oral (10/25 0418) BP: 120/74 (10/25 0418) Pulse Rate: 89 (10/25 0418)  Labs: Recent Labs    01/26/20 0333  HGB 7.8*  HCT 24.9*  PLT 675*  CREATININE 0.68    Estimated Creatinine Clearance: 108.5 mL/min (by C-G formula based on SCr of 0.68 mg/dL).   Medical History: Past Medical History:  Diagnosis Date  . Diabetes mellitus without complication ([place])   . GERD (gastroesophageal reflux disease)   . Gout   . Hyperlipidemia   . Hypertension   . Insomnia   . Osteoarthritis      Assessment: 59 yo M with hx L BKA admitted for ischemic LLE and thrombus s/p embolectomy 10/18. 10/19 US shows occlusion in the LLE superficial femoral, popliteal and posterior tibial arteries. Apixaban was started 10/20 and now plans are for revision of left BKA to AKA. Pharmacy to dose heparin (last dose of apixaban was 10/26 ~ 10pm) -hg= 7.8 -he was on heparin 10/20 with last rate of 2150 units/hr  Goal of Therapy:  Heparin level 0.3-0.7 units/ml aPTT 66-102 seconds Monitor platelets by anticoagulation protocol: Yes   Plan:  -restart heparin at 2000 units/hr -Heparin level and aPTT in 6 hours and daily wth CBC daily  Hildred Laser, PharmD Clinical Pharmacist **Pharmacist phone directory can now be found on [email or web address] ([email or web address]).  Listed under [place].

## 2020-01-26 NOTE — Progress Notes (Addendum)
PROGRESS NOTE    [name]  HER:[id number] DOB: [birth year] DOA: 01/19/2020 PCP: Vidal Schwalbe, MD   Chief Complaint  Patient presents with  . Leg Pain    Brief Narrative:    59 year old gentleman prior history of left BKA, recent COVID-19 pneumonia, diabetes mellitus, insulin-dependent presents with severe pain in the left BKA stump antibiotics.  He was admitted for acute left lower extremity ischemia underwent emergent iliofemoral embolectomy and is on IV heparin. Patient's left groin wound has purulent drainage and cultures from the wound sent and he was started on oral keflex. Pt agreeble to AKA now.  Toc on board for SNF.   Assessment & Plan:   Principal Problem:   Ischemic leg Active Problems:   Normocytic anemia   Essential hypertension   DM type 2 causing vascular disease (HCC)   Rhabdomyolysis   Lower limb ischemia   Acute left lower extremity ischemia Duplex shows distal BKA SFA occlusion Echocardiogram does not show any source of thrombus. S/p emergent embolectomy, left iliofemoral on 01/19/2020. Plan for revision to AKA , pt agreeable.  On IV heparin.   Mild hyponatremia: Pt asymptomatic. Sodium 132.    Rhabdomyolysis:  Improved.   Elevated liver enzymes:  Probably from rhabdomyolysis. Patient denies any nausea vomiting or abdominal pain   Anemia of blood loss from the surgery superimposed on anemia of chronic disease Baseline hemoglobin around 9 currently at 7.8 and stable.  Transfuse to keep hemoglobin greater than 7.  will order anemia panel.    Essential hypertension Blood pressure parameters are optimal   Type 2 diabetes mellitus CBG (last 3)  Recent Labs    01/26/20 0610 01/26/20 1203 01/26/20 1633  GLUCAP 187* 247* 198*   Increase Lantus to 45 units,  change sliding scale to moderate SSI and added NovoLog 2 units 3 times daily AC., increase to 4 units if able to eat >50% of meals.  Changes in  medications    Leukocytosis:  ? From left groin incision wound.  On keflex ,ordered by vascular.   Constipation Stool softeners and laxatives ordered.   DVT prophylaxis: Eliquis Code Status: full code Family Communication: (none at bedside.  Disposition:   Status is: Inpatient  Remains inpatient appropriate because:Unsafe d/c plan   Dispo: The patient is from: Home              Anticipated d/c is to: SNF              Anticipated d/c date is: > 3 days              Patient currently is not medically stable to d/c.       Consultants:   Vascular surgery.   Procedures:S/p emergent embolectomy, left iliofemoral on 01/19/2020.  Antimicrobials: oral keflex.   Subjective: Persistent pain in the BKA STUMP.   Objective: Vitals:   01/26/20 0800 01/26/20 0810 01/26/20 1206 01/26/20 1600  BP:  115/74 103/66 114/69  Pulse:  99 86 85  Resp: 18 18 18 16   Temp:  98.3 F (36.8 C) 98.6 F (37 C) 98.9 F (37.2 C)  TempSrc:  Oral Oral Oral  SpO2: 94% 93% 96% 95%  Weight:      Height:        Intake/Output Summary (Last 24 hours) at 01/26/2020 1659 Last data filed at 01/26/2020 1500 Gross per 24 hour  Intake 700 ml  Output 1250 ml  Net -550 ml   Filed Weights   01/19/20 1448 01/24/20 0413  01/24/20 1551  Weight: 118.8 kg 98.6 kg 98.6 kg    Examination:  General exam: in mild distress from pain in the BKA stump.   Respiratory system:  Air entry fair, no wheezing heard . No tachypnea. Cardiovascular system: S1S2 heard, RRR,no JVD,  Gastrointestinal system: abd is soft, non tneder nondistended bowel sounds wnl.  Central nervous system: alert and non focal.  Extremities: Left BKA, cold to touch.  Skin: No ulcers seen.  Psychiatry: . Mood is appropriate.    Data Reviewed: I have personally reviewed following labs and imaging studies  CBC: Recent Labs  Lab 01/19/20 2352 01/22/20 0035 01/26/20 0333  WBC 16.1* 13.7* 15.4*  NEUTROABS  --  9.1*  --   HGB 8.3*  8.3* 7.8*  HCT 26.2* 26.7* 24.9*  MCV 83.4 86.7 83.8  PLT 493* 446* 675*    Basic Metabolic Panel: Recent Labs  Lab 01/19/20 2352 01/21/20 0331 01/22/20 0035 01/23/20 0101 01/26/20 0333  NA 131* 133* 133* 132* 131*  K 3.9 4.5 4.8 3.9 4.4  CL 99 100 100 98 97*  CO2 24 25 25 24 23   GLUCOSE 147* 151* 147* 220* 183*  BUN 12 10 8  7* 10  CREATININE 0.64 0.63 0.63 0.64 0.68  CALCIUM 8.4* 8.7* 8.4* 8.6* 8.8*    GFR: Estimated Creatinine Clearance: 108.5 mL/min (by C-G formula based on SCr of 0.68 mg/dL).  Liver Function Tests: No results for input(s): AST, ALT, ALKPHOS, BILITOT, PROT, ALBUMIN in the last 168 hours.  CBG: Recent Labs  Lab 01/25/20 1639 01/25/20 2110 01/26/20 0610 01/26/20 1203 01/26/20 1633  GLUCAP 202* 256* 187* 247* 198*     No results found for this or any previous visit (from the past 240 hour(s)).       Radiology Studies: No results found.      Scheduled Meds: . amitriptyline  25 mg Oral QHS  . amLODipine  10 mg Oral Daily  . aspirin EC  81 mg Oral Q0600  . cephALEXin  500 mg Oral TID  . docusate sodium  100 mg Oral Daily  . insulin aspart  0-15 Units Subcutaneous TID WC  . insulin aspart  0-5 Units Subcutaneous QHS  . insulin aspart  2 Units Subcutaneous TID WC  . insulin glargine  45 Units Subcutaneous QHS  . metoprolol succinate  25 mg Oral Daily  . pantoprazole  40 mg Oral Daily  . rosuvastatin  20 mg Oral Daily   Continuous Infusions: . sodium chloride    . heparin 2,000 Units/hr (01/26/20 1045)  . magnesium sulfate bolus IVPB       LOS: 6 days       Hosie Poisson, MD [facility]   To contact the attending provider between 7A-7P or the covering provider during after hours 7P-7A, please log into the web site [email or web address] and access using universal [place] password for that web site. If you do not have the password, please call the hospital operator.  01/26/2020, 4:59 PM

## 2020-01-26 NOTE — Progress Notes (Addendum)
PT Cancellation Note  Patient Details Name: [name] MRN: [id number] DOB: 04-15-1960   Cancelled Treatment:    Reason Eval/Treat Not Completed: Pain limiting ability to participate Pt reporting 30/10 pain and declining mobility., Rn notified. Will follow.  Attempted to see in PM but pt continues to refuse due to pain despite getting pain meds. Will follow.   Marguarite Arbour A Roshana Shuffield 01/26/2020, 9:41 AM Marisa Severin, PT, DPT Acute Rehabilitation Services Pager [phone] Office [phone]

## 2020-01-26 NOTE — Progress Notes (Addendum)
[facility]  Subjective  - Pain is no better and he is now ready to move forward with surgery.    Objective 120/74 89 (!) 97.4 F (36.3 C) (Oral) 18 93%  Intake/Output Summary (Last 24 hours) at 01/26/2020 0723 Last data filed at 01/26/2020 0018 Gross per 24 hour  Intake 720 ml  Output 1800 ml  Net -1080 ml    Left BKA cold, dark skin ischemic changes Left groin soft 4 x 4 placed over incision Lungs non labored breathing  Assessment/Planning: Left BKA ischemia s/p Acute thrombosis   Stop Eliquis and restart Heparin.  Plan for revision of left BKA to AKA.  Patient in agreement.    Roxy Horseman 01/26/2020 7:23 AM --   Left groin incision with purulent drainage white/yellow no erythema or fluctuance but has some tenderness Left BKA looks about the same. Thigh is warm Pt now wants AKA will look for date later this week Stop Eliquis today and transition to heparin Wound culture left groin Start empiric Keflex  [name], MD [facility] [place]: [phone]  Laboratory Lab Results: Recent Labs    01/26/20 0333  WBC 15.4*  HGB 7.8*  HCT 24.9*  PLT 675*   BMET Recent Labs    01/26/20 0333  NA 131*  K 4.4  CL 97*  CO2 23  GLUCOSE 183*  BUN 10  CREATININE 0.68  CALCIUM 8.8*    COAG Lab Results  Component Value Date   INR 1.3 (H) 01/09/2020   No results found for: PTT

## 2020-01-26 NOTE — Progress Notes (Signed)
Inpatient Diabetes Program Recommendations  AACE/ADA: New Consensus Statement on Inpatient Glycemic Control (2015)  Target Ranges:  Prepandial:   less than 140 mg/dL      Peak postprandial:   less than 180 mg/dL (1-2 hours)      Critically ill patients:  140 - 180 mg/dL   Lab Results  Component Value Date   GLUCAP 247 (H) 01/26/2020   HGBA1C 9.8 (H) 01/08/2020    Review of Glycemic Control Results for [name], [name] (MRN [id number]) as of 01/26/2020 13:26  Ref. Range 01/25/2020 06:02 01/25/2020 11:26 01/25/2020 16:39 01/25/2020 21:10 01/26/2020 06:10 01/26/2020 12:03  Glucose-Capillary Latest Ref Range: 70 - 99 mg/dL 166 (H) 221 (H) 202 (H) 256 (H) 187 (H) 247 (H)    Inpatient Diabetes Program Recommendations:     Novolog 4 units tid with meals if eats at least 50% of meal  Will continue to follow while inpatient.  Thank you, Reche Dixon, RN, BSN Diabetes Coordinator Inpatient Diabetes Program [phone] (team pager from 8a-5p)

## 2020-01-27 ENCOUNTER — Other Ambulatory Visit: Payer: Self-pay | Admitting: "Endocrinology

## 2020-01-27 DIAGNOSIS — I998 Other disorder of circulatory system: Secondary | ICD-10-CM | POA: Diagnosis not present

## 2020-01-27 DIAGNOSIS — I10 Essential (primary) hypertension: Secondary | ICD-10-CM | POA: Diagnosis not present

## 2020-01-27 DIAGNOSIS — E1159 Type 2 diabetes mellitus with other circulatory complications: Secondary | ICD-10-CM | POA: Diagnosis not present

## 2020-01-27 DIAGNOSIS — D649 Anemia, unspecified: Secondary | ICD-10-CM | POA: Diagnosis not present

## 2020-01-27 LAB — GLUCOSE, CAPILLARY
Glucose-Capillary: 264 mg/dL — ABNORMAL HIGH (ref 70–99)
Glucose-Capillary: 200 mg/dL — ABNORMAL HIGH (ref 70–99)
Glucose-Capillary: 235 mg/dL — ABNORMAL HIGH (ref 70–99)
Glucose-Capillary: 218 mg/dL — ABNORMAL HIGH (ref 70–99)

## 2020-01-27 LAB — CBC
HCT: 23.7 % — ABNORMAL LOW (ref 39.0–52.0)
Hemoglobin: 7.6 g/dL — ABNORMAL LOW (ref 13.0–17.0)
MCH: 26.9 pg (ref 26.0–34.0)
MCHC: 32.1 g/dL (ref 30.0–36.0)
MCV: 83.7 fL (ref 80.0–100.0)
Platelets: 691 10*3/uL — ABNORMAL HIGH (ref 150–400)
RBC: 2.83 MIL/uL — ABNORMAL LOW (ref 4.22–5.81)
RDW: 14.1 % (ref 11.5–15.5)
WBC: 14.5 10*3/uL — ABNORMAL HIGH (ref 4.0–10.5)
nRBC: 0 % (ref 0.0–0.2)

## 2020-01-27 LAB — APTT
aPTT: 61 seconds — ABNORMAL HIGH (ref 24–36)
aPTT: 49 seconds — ABNORMAL HIGH (ref 24–36)

## 2020-01-27 LAB — RETICULOCYTES
Immature Retic Fract: 22.3 % — ABNORMAL HIGH (ref 2.3–15.9)
RBC.: 2.82 MIL/uL — ABNORMAL LOW (ref 4.22–5.81)
Retic Count, Absolute: 57.8 10*3/uL (ref 19.0–186.0)
Retic Ct Pct: 2.1 % (ref 0.4–3.1)

## 2020-01-27 LAB — IRON AND TIBC
Iron: 19 ug/dL — ABNORMAL LOW (ref 45–182)
Saturation Ratios: 7 % — ABNORMAL LOW (ref 17.9–39.5)
TIBC: 258 ug/dL (ref 250–450)
UIBC: 239 ug/dL

## 2020-01-27 LAB — FOLATE: Folate: 7.7 ng/mL (ref >5.9)

## 2020-01-27 LAB — VITAMIN B12: Vitamin B-12: 482 pg/mL (ref 180–914)

## 2020-01-27 LAB — FERRITIN: Ferritin: 670 ng/mL — ABNORMAL HIGH (ref 24–336)

## 2020-01-27 LAB — MRSA PCR SCREENING: MRSA by PCR: NEGATIVE

## 2020-01-27 LAB — HEPARIN LEVEL (UNFRACTIONATED)
Heparin Unfractionated: 0.98 IU/mL — ABNORMAL HIGH (ref 0.30–0.70)
Heparin Unfractionated: 0.81 IU/mL — ABNORMAL HIGH (ref 0.30–0.70)

## 2020-01-27 LAB — PROCALCITONIN: Procalcitonin: 0.1 ng/mL

## 2020-01-27 MED ORDER — INSULIN GLARGINE 100 UNIT/ML ~~LOC~~ SOLN
48.0000 [IU] | Freq: Every day | SUBCUTANEOUS | Status: DC
  Administered 2020-01-27 – 2020-01-28 (×2): 48 [IU] via SUBCUTANEOUS
  Filled 2020-01-27 (×3): qty 0.48

## 2020-01-27 MED ORDER — INSULIN ASPART 100 UNIT/ML ~~LOC~~ SOLN
4.0000 [IU] | Freq: Three times a day (TID) | SUBCUTANEOUS | Status: DC
  Administered 2020-01-27 – 2020-02-01 (×15): 4 [IU] via SUBCUTANEOUS

## 2020-01-27 NOTE — TOC Progression Note (Signed)
Transition of Care [facility]) - Progression Note    Patient Details  Name: [name] MRN: [id number] Date of Birth: 09/22/1960  Transition of Care [facility]) CM/SW [place], [place] Phone Number: 01/27/2020, 9:42 AM  Clinical Narrative:     CSW visit with patient at bedside. CSW informed patient of bed offer from [facility]. Patient accepted offer. CSW advised [facility] in [place] closer to his home) has not confirmed.  Patient states he has not been vaccinated but would like to received vaccine. Patient requested  Pfizer vaccine. CSW informed  Dr. Karleen Hampshire, she approved.   Thurmond Butts, MSW, [place] Clinical Social Worker   Expected Discharge Plan: Skilled Nursing Facility Barriers to Discharge: Continued Medical Work up, SNF Pending bed offer  Expected Discharge Plan and Services Expected Discharge Plan: [place] In-house Referral: Clinical Social Work     Living arrangements for the past 2 months: Single Family Home                                       Social Determinants of Health (SDOH) Interventions    Readmission Risk Interventions No flowsheet data found.

## 2020-01-27 NOTE — Progress Notes (Signed)
PROGRESS NOTE    [name]  DGL:[id number] DOB: Jul 27, 1960 DOA: 01/19/2020 PCP: Vidal Schwalbe, MD   Chief Complaint  Patient presents with  . Leg Pain    Brief Narrative:    59 year old gentleman prior history of left BKA, recent COVID-19 pneumonia, diabetes mellitus, insulin-dependent presents with severe pain in the left BKA stump antibiotics.  He was admitted for acute left lower extremity ischemia underwent emergent iliofemoral embolectomy.He was started on eliquis for the LL ischemic limb, but later on transitioned to IV heparin as patient agreed to undergo AKA to be scheduled this week. Meanwhile he has persistent leukocytosis, and his left groin incision wound with some drainage. Cultures from the wound sent showing rare wbc seen with some gram positive cocci,. He was empirically started on keflex by vascular surgery. He is currently afebrile T max of 99.7 and wbc count around 14.5.    Assessment & Plan:   Principal Problem:   Ischemic leg Active Problems:   Normocytic anemia   Essential hypertension   DM type 2 causing vascular disease (HCC)   Rhabdomyolysis   Lower limb ischemia   Acute left lower extremity ischemia Duplex shows distal BKA SFA occlusion Echocardiogram does not show any source of thrombus. S/p emergent embolectomy, left iliofemoral on 01/19/2020. Plan for revision to AKA , pt agreeable.  On IV heparin.   Hyponatremia:  Mild , ? Hyperglycemia.  Serum osmo Is 280, will get urine sodium, tsh and am cortisol.    Rhabdomyolysis:  Improved. Check CK levels in am.   Elevated liver enzymes:  Probably from rhabdomyolysis. Check liver enzymes in am.  Patient denies any nausea vomiting or abdominal pain   Anemia of blood loss from the surgery superimposed on anemia of chronic disease Baseline hemoglobin around 9 currently at 7.8 and stable.  Transfuse to keep hemoglobin greater than 7. He will probably need  1to 2 units of prbc transfusion  prior to the AKA.    Essential hypertension Blood pressure parameters are optimal   Type 2 diabetes mellitus CBG (last 3)  Recent Labs    01/26/20 1633 01/26/20 2048 01/27/20 0615  GLUCAP 198* 281* 264*   Increased lantus to 48 units, continue with mod SSI and novolog 4 units TIDAC.  No changes in meds.     Leukocytosis:  ? From left groin wound. Ordered pro calcitonin level.   Constipation Stool softeners and laxatives ordered.   DVT prophylaxis: Heparin.  Code Status: full code Family Communication: (none at bedside.  Disposition:   Status is: Inpatient  Remains inpatient appropriate because:Ongoing diagnostic testing needed not appropriate for outpatient work up, Unsafe d/c plan and IV treatments appropriate due to intensity of illness or inability to take PO   Dispo: The patient is from: Home              Anticipated d/c is to: SNF              Anticipated d/c date is: > 3 days              Patient currently is not medically stable to d/c.       Consultants:   Vascular surgery.   Procedures:S/p emergent embolectomy, left iliofemoral on 01/19/2020.  Antimicrobials: Kefelex   Subjective: BKA Stump is painful.   Objective: Vitals:   01/26/20 2001 01/26/20 2335 01/27/20 0318 01/27/20 0844  BP:  113/62 117/70 115/71  Pulse:  94 88 87  Resp: 20 20 16 18   Temp: 98  F (36.7 C) 99.7 F (37.6 C) 98.2 F (36.8 C) 97.9 F (36.6 C)  TempSrc: Oral Oral Oral Oral  SpO2:  94% 93% 93%  Weight:      Height:        Intake/Output Summary (Last 24 hours) at 01/27/2020 1052 Last data filed at 01/27/2020 0300 Gross per 24 hour  Intake 681.96 ml  Output 700 ml  Net -18.04 ml   Filed Weights   01/19/20 1448 01/24/20 0413 01/24/20 1551  Weight: 118.8 kg 98.6 kg 98.6 kg    Examination:  General exam: laying in bed, comfortable,  Respiratory system:  Air entry fair bilateral, no wheezing heard. On Room air.  Cardiovascular system: S1s2, RRR, no JVD.   Gastrointestinal system: abd is soft, non tender non distended bowel sounds wnl.  Central nervous system: alert , oriented, grossly non focal.  Extremities: Left BKA, cold to touch.  Skin: Left groin incision wound Psychiatry: Mood is appropriate.    Data Reviewed: I have personally reviewed following labs and imaging studies  CBC: Recent Labs  Lab 01/22/20 0035 01/26/20 0333 01/27/20 0045  WBC 13.7* 15.4* 14.5*  NEUTROABS 9.1*  --   --   HGB 8.3* 7.8* 7.6*  HCT 26.7* 24.9* 23.7*  MCV 86.7 83.8 83.7  PLT 446* 675* 691*    Basic Metabolic Panel: Recent Labs  Lab 01/21/20 0331 01/22/20 0035 01/23/20 0101 01/26/20 0333  NA 133* 133* 132* 131*  K 4.5 4.8 3.9 4.4  CL 100 100 98 97*  CO2 25 25 24 23   GLUCOSE 151* 147* 220* 183*  BUN 10 8 7* 10  CREATININE 0.63 0.63 0.64 0.68  CALCIUM 8.7* 8.4* 8.6* 8.8*    GFR: Estimated Creatinine Clearance: 108.5 mL/min (by C-G formula based on SCr of 0.68 mg/dL).  Liver Function Tests: No results for input(s): AST, ALT, ALKPHOS, BILITOT, PROT, ALBUMIN in the last 168 hours.  CBG: Recent Labs  Lab 01/26/20 0610 01/26/20 1203 01/26/20 1633 01/26/20 2048 01/27/20 0615  GLUCAP 187* 247* 198* 281* 264*     Recent Results (from the past 240 hour(s))  Aerobic Culture (superficial specimen)     Status: None (Preliminary result)   Collection Time: 01/26/20  7:56 AM   Specimen: Wound  Result Value Ref Range Status   Specimen Description WOUND LEFT GROIN  Final   Special Requests NONE  Final   Gram Stain   Final    RARE WBC PRESENT, PREDOMINANTLY MONONUCLEAR FEW GRAM POSITIVE COCCI RARE GRAM NEGATIVE RODS    Culture   Final    CULTURE REINCUBATED FOR BETTER GROWTH Performed at [facility], [place] [address] [place], [place] [address]    Report Status PENDING  Incomplete         Radiology Studies: No results found.      Scheduled Meds: . amitriptyline  25 mg Oral QHS  . amLODipine  10 mg Oral Daily    . aspirin EC  81 mg Oral Q0600  . cephALEXin  500 mg Oral TID  . docusate sodium  100 mg Oral Daily  . insulin aspart  0-15 Units Subcutaneous TID WC  . insulin aspart  0-5 Units Subcutaneous QHS  . insulin aspart  4 Units Subcutaneous TID WC  . insulin glargine  48 Units Subcutaneous QHS  . metoprolol succinate  25 mg Oral Daily  . pantoprazole  40 mg Oral Daily  . rosuvastatin  20 mg Oral Daily   Continuous Infusions: . sodium  chloride    . heparin 2,100 Units/hr (01/27/20 0916)  . magnesium sulfate bolus IVPB       LOS: 7 days       Hosie Poisson, MD [facility]   To contact the attending provider between 7A-7P or the covering provider during after hours 7P-7A, please log into the web site [email or web address] and access using universal [place] password for that web site. If you do not have the password, please call the hospital operator.  01/27/2020, 10:52 AM

## 2020-01-27 NOTE — Progress Notes (Addendum)
   VASCULAR SURGERY ASSESSMENT & PLAN:  PAD: 8 Days Post-Op left ileofemoral thrombectomy. Chronic left residual limb pain with ischemic skin changes. Eliquis on hold for left BKA revision this week. Left groin incision: with drainage. Culture sent; results pending. On Keflex. Afebrile. WBC 14.5   SUBJECTIVE:   No new complaints  PHYSICAL EXAM:   Vitals:   01/26/20 2000 01/26/20 2001 01/26/20 2335 01/27/20 0318  BP: 114/75  113/62 117/70  Pulse: 93  94 88  Resp:  20 20 16   Temp:  98 F (36.7 C) 99.7 F (37.6 C) 98.2 F (36.8 C)  TempSrc:  Oral Oral Oral  SpO2: 94%  94% 93%  Weight:      Height:       General appearance: Alert, awake in NAD CV: RRR Lungs: CTAB LLE: Small area of skin edge separation at superior aspect of left groin incision.  Appears to be primarily serosanguineous fluid on gauze.  Left residual limb with bullous lesions, cool to touch.  Component 1 d ago  Specimen Description WOUND LEFT GROIN   Special Requests NONE   Gram Stain RARE WBC PRESENT, PREDOMINANTLY MONONUCLEAR  FEW GRAM POSITIVE COCCI  RARE GRAM NEGATIVE RODS  Performed at [facility], [place] [address] [place], [place] [address]   Culture PENDING   Report Status PENDING    Agree with above. Pt consenting to AKA.  Follow up cultures Dr Trula Slade to do AKA on Thursday. Continue heparin  [name], MD [facility] [place] Office: [phone]   LABS:   Lab Results  Component Value Date   WBC 14.5 (H) 01/27/2020   HGB 7.6 (L) 01/27/2020   HCT 23.7 (L) 01/27/2020   MCV 83.7 01/27/2020   PLT 691 (H) 01/27/2020   Lab Results  Component Value Date   CREATININE 0.68 01/26/2020   Lab Results  Component Value Date   INR 1.3 (H) 01/09/2020   CBG (last 3)  Recent Labs    01/26/20 1633 01/26/20 2048 01/27/20 0615  GLUCAP 198* 281* 264*    PROBLEM LIST:    Principal Problem:   Ischemic leg Active Problems:   Normocytic anemia   Essential  hypertension   DM type 2 causing vascular disease (HCC)   Rhabdomyolysis   Lower limb ischemia   CURRENT MEDS:   . amitriptyline  25 mg Oral QHS  . amLODipine  10 mg Oral Daily  . aspirin EC  81 mg Oral Q0600  . cephALEXin  500 mg Oral TID  . docusate sodium  100 mg Oral Daily  . insulin aspart  0-15 Units Subcutaneous TID WC  . insulin aspart  0-5 Units Subcutaneous QHS  . insulin aspart  2 Units Subcutaneous TID WC  . insulin glargine  45 Units Subcutaneous QHS  . metoprolol succinate  25 mg Oral Daily  . pantoprazole  40 mg Oral Daily  . rosuvastatin  20 mg Oral Daily   Risa Grill, [place] Office: [phone] 01/27/2020

## 2020-01-27 NOTE — Progress Notes (Signed)
ANTICOAGULATION CONSULT NOTE  Pharmacy Consult for heparin  Indication: ischemic limb, multiple occluded LLE arteries  No Known Allergies  Patient Measurements: Height: 5\' 7"  (170.2 cm) Weight: 98.6 kg (217 lb 6 oz) IBW/kg (Calculated) : 66.1 HEPARIN DW (KG): 87.4   Vital Signs: Temp: 98.3 F (36.8 C) (10/26 1542) Temp Source: Oral (10/26 1542) BP: 119/70 (10/26 1542) Pulse Rate: 84 (10/26 1542)  Labs: Recent Labs    01/26/20 0333 01/26/20 1419 01/27/20 0045 01/27/20 1456  HGB 7.8*  --  7.6*  --   HCT 24.9*  --  23.7*  --   PLT 675*  --  691*  --   APTT  --  48* 61*  --   HEPARINUNFRC  --  2.02* 0.98* 0.81*  CREATININE 0.68  --   --   --     Estimated Creatinine Clearance: 108.5 mL/min (by C-G formula based on SCr of 0.68 mg/dL).   Medical History: Past Medical History:  Diagnosis Date  . Diabetes mellitus without complication ([place])   . GERD (gastroesophageal reflux disease)   . Gout   . Hyperlipidemia   . Hypertension   . Insomnia   . Osteoarthritis      Assessment: 59 yo M with hx L BKA admitted for ischemic LLE and thrombus s/p embolectomy 10/18. 10/19 US shows occlusion in the LLE superficial femoral, popliteal and posterior tibial arteries. Apixaban was started 10/20 and now plans are for revision of left BKA to AKA. Pharmacy to dose heparin (last dose of apixaban was 10/24 ~ 10pm).  Heparin level still altered by DOAC, aPTT is subtherapeutic at 49 seconds. No infusion issues per nursing throughout the shift.  Goal of Therapy:  Heparin level 0.3-0.7 units/ml aPTT 66-102 seconds Monitor platelets by anticoagulation protocol: Yes   Plan:  -Increase heparin to 2300 -Recheck aPTT in 6h   Arrie Senate, PharmD, BCPS Clinical Pharmacist [phone] Please check AMION for all [place] numbers 01/27/2020

## 2020-01-27 NOTE — Progress Notes (Signed)
Physical Therapy Treatment Patient Details Name: [name] MRN: [id number] DOB: [birth year] Today's Date: 01/27/2020    History of Present Illness Pt is a 59 y.o. male with medical history significant for hypertension, hyperlipidemia, insulin-dependent diabetes mellitus, history of left BKA, and recent admission with acute encephalopathy suspected secondary to COVID-19, presented to the ED with pain at his left BKA stump and buttock. Pt found to have acute ischemia left leg. Now s/p left iliofemoral embolectomy on 01/19/20.    PT Comments    Pt supine in bed on arrival.  Pt assisted to recliner for OOB tolerance.  Limited ROM to LLE.  LLE remains cold to touch. Plan for surgery later this week per chart.  Continue to recommend SNF.     Follow Up Recommendations  SNF     Equipment Recommendations  None recommended by PT    Recommendations for Other Services       Precautions / Restrictions Precautions Precautions: Fall Restrictions Weight Bearing Restrictions: No    Mobility  Bed Mobility Overal bed mobility: Needs Assistance Bed Mobility: Supine to Sit;Sit to Supine     Supine to sit: Min assist Sit to supine: Min assist   General bed mobility comments: Min assistance to move LEs to edge of bed.  Pt able to manage elevation of trunk into sitting.  Transfers Overall transfer level: Needs assistance Equipment used: None Transfers: Squat Pivot Transfers     Squat pivot transfers: Min assist     General transfer comment: Min assistance to raise hips and pivot from bed to recliner.  Ambulation/Gait Ambulation/Gait assistance:  (Unable.)               Stairs             Wheelchair Mobility    Modified Rankin (Stroke Patients Only)       Balance Overall balance assessment: Needs assistance Sitting-balance support: Feet supported;No upper extremity supported Sitting balance-Leahy Scale: Good Sitting balance - Comments: seated at EOB      Standing balance-Leahy Scale: Poor                              Cognition Arousal/Alertness: Awake/alert Behavior During Therapy: WFL for tasks assessed/performed;Flat affect Overall Cognitive Status: Within Functional Limits for tasks assessed                                        Exercises Amputee Exercises Hip ABduction/ADduction: AROM;Left;10 reps;Supine Hip Flexion/Marching: AROM;Left;10 reps;Seated Knee Flexion: AROM;Left;10 reps;Seated    General Comments        Pertinent Vitals/Pain Pain Assessment: 0-10 Pain Score: 9  Pain Location: LLE Pain Descriptors / Indicators: Discomfort;Grimacing;Guarding;Sore Pain Intervention(s): Monitored during session;Limited activity within patient's tolerance    Home Living                      Prior Function            PT Goals (current goals can now be found in the care plan section) Acute Rehab PT Goals Patient Stated Goal: return home after rehab Potential to Achieve Goals: Good Progress towards PT goals: Progressing toward goals    Frequency    Min 2X/week      PT Plan Current plan remains appropriate    Co-evaluation  AM-PAC PT "6 Clicks" Mobility   Outcome Measure  Help needed turning from your back to your side while in a flat bed without using bedrails?: None Help needed moving from lying on your back to sitting on the side of a flat bed without using bedrails?: A Little Help needed moving to and from a bed to a chair (including a wheelchair)?: A Little Help needed standing up from a chair using your arms (e.g., wheelchair or bedside chair)?: A Little Help needed to walk in hospital room?: Total Help needed climbing 3-5 steps with a railing? : Total 6 Click Score: 15    End of Session Equipment Utilized During Treatment: Gait belt Activity Tolerance: Patient tolerated treatment well;Patient limited by fatigue;Patient limited by pain Patient  left: in chair;with call bell/phone within reach;with chair alarm set Nurse Communication: Mobility status PT Visit Diagnosis: Unsteadiness on feet (R26.81);Other abnormalities of gait and mobility (R26.89);Muscle weakness (generalized) (M62.81);History of falling (Z91.81)     Time: 0960-4540 PT Time Calculation (min) (ACUTE ONLY): 15 min  Charges:  $Therapeutic Activity: 8-22 mins                     Erasmo Leventhal , PTA Acute Rehabilitation Services Pager [phone] Office [phone]     [name] 01/27/2020, 1:44 PM

## 2020-01-27 NOTE — Progress Notes (Signed)
ANTICOAGULATION CONSULT NOTE  Pharmacy Consult for heparin  Indication: ischemic limb, multiple occluded LLE arteries  No Known Allergies  Patient Measurements: Height: 5\' 7"  (170.2 cm) Weight: 98.6 kg (217 lb 6 oz) IBW/kg (Calculated) : 66.1 HEPARIN DW (KG): 87.4   Vital Signs: Temp: 98.2 F (36.8 C) (10/26 0318) Temp Source: Oral (10/26 0318) BP: 117/70 (10/26 0318) Pulse Rate: 88 (10/26 0318)  Labs: Recent Labs    01/26/20 0333 01/26/20 1419 01/27/20 0045  HGB 7.8*  --  7.6*  HCT 24.9*  --  23.7*  PLT 675*  --  691*  APTT  --  48* 61*  HEPARINUNFRC  --  2.02* 0.98*  CREATININE 0.68  --   --     Estimated Creatinine Clearance: 108.5 mL/min (by C-G formula based on SCr of 0.68 mg/dL).   Medical History: Past Medical History:  Diagnosis Date  . Diabetes mellitus without complication ([place])   . GERD (gastroesophageal reflux disease)   . Gout   . Hyperlipidemia   . Hypertension   . Insomnia   . Osteoarthritis      Assessment: 59 yo M with hx L BKA admitted for ischemic LLE and thrombus s/p embolectomy 10/18. 10/19 US shows occlusion in the LLE superficial femoral, popliteal and posterior tibial arteries. Apixaban was started 10/20 and now plans are for revision of left BKA to AKA. Pharmacy to dose heparin (last dose of apixaban was 10/24 ~ 10pm) -hg= 7.6 -aPTT= 61, heparin level= 0.98  Goal of Therapy:  Heparin level 0.3-0.7 units/ml aPTT 66-102 seconds Monitor platelets by anticoagulation protocol: Yes   Plan:  -Increase heparin to 2100 units/hr -Heparin level and aPTT in 6 hours and daily wth CBC daily  Hildred Laser, PharmD Clinical Pharmacist **Pharmacist phone directory can now be found on [email or web address] ([email or web address]).  Listed under [place].

## 2020-01-28 ENCOUNTER — Inpatient Hospital Stay (HOSPITAL_COMMUNITY): Payer: Medicaid Other

## 2020-01-28 DIAGNOSIS — I10 Essential (primary) hypertension: Secondary | ICD-10-CM | POA: Diagnosis not present

## 2020-01-28 DIAGNOSIS — E1159 Type 2 diabetes mellitus with other circulatory complications: Secondary | ICD-10-CM | POA: Diagnosis not present

## 2020-01-28 DIAGNOSIS — D649 Anemia, unspecified: Secondary | ICD-10-CM | POA: Diagnosis not present

## 2020-01-28 DIAGNOSIS — I998 Other disorder of circulatory system: Secondary | ICD-10-CM | POA: Diagnosis not present

## 2020-01-28 LAB — COMPREHENSIVE METABOLIC PANEL
ALT: 96 U/L — ABNORMAL HIGH (ref 0–44)
AST: 46 U/L — ABNORMAL HIGH (ref 15–41)
Albumin: 1.9 g/dL — ABNORMAL LOW (ref 3.5–5.0)
Alkaline Phosphatase: 165 U/L — ABNORMAL HIGH (ref 38–126)
Anion gap: 10 (ref 5–15)
BUN: 9 mg/dL (ref 8–23)
CO2: 24 mmol/L (ref 22–32)
Calcium: 8.5 mg/dL — ABNORMAL LOW (ref 8.9–10.3)
Chloride: 94 mmol/L — ABNORMAL LOW (ref 98–111)
Creatinine, Ser: 0.7 mg/dL (ref 0.61–1.24)
GFR, Estimated: >60 mL/min (ref >60)
Glucose, Bld: 230 mg/dL — ABNORMAL HIGH (ref 70–99)
Potassium: 4.5 mmol/L (ref 3.5–5.1)
Sodium: 128 mmol/L — ABNORMAL LOW (ref 135–145)
Total Bilirubin: 0.4 mg/dL (ref 0.3–1.2)
Total Protein: 7.7 g/dL (ref 6.5–8.1)

## 2020-01-28 LAB — CBC
HCT: 23.4 % — ABNORMAL LOW (ref 39.0–52.0)
Hemoglobin: 7.5 g/dL — ABNORMAL LOW (ref 13.0–17.0)
MCH: 26.7 pg (ref 26.0–34.0)
MCHC: 32.1 g/dL (ref 30.0–36.0)
MCV: 83.3 fL (ref 80.0–100.0)
Platelets: 712 10*3/uL — ABNORMAL HIGH (ref 150–400)
RBC: 2.81 MIL/uL — ABNORMAL LOW (ref 4.22–5.81)
RDW: 14.1 % (ref 11.5–15.5)
WBC: 13.3 10*3/uL — ABNORMAL HIGH (ref 4.0–10.5)
nRBC: 0 % (ref 0.0–0.2)

## 2020-01-28 LAB — GLUCOSE, CAPILLARY
Glucose-Capillary: 169 mg/dL — ABNORMAL HIGH (ref 70–99)
Glucose-Capillary: 185 mg/dL — ABNORMAL HIGH (ref 70–99)
Glucose-Capillary: 216 mg/dL — ABNORMAL HIGH (ref 70–99)
Glucose-Capillary: 213 mg/dL — ABNORMAL HIGH (ref 70–99)

## 2020-01-28 LAB — PROCALCITONIN: Procalcitonin: 0.13 ng/mL

## 2020-01-28 LAB — CK: Total CK: 509 U/L — ABNORMAL HIGH (ref 49–397)

## 2020-01-28 LAB — APTT
aPTT: 63 seconds — ABNORMAL HIGH (ref 24–36)
aPTT: 68 seconds — ABNORMAL HIGH (ref 24–36)

## 2020-01-28 LAB — HEPARIN LEVEL (UNFRACTIONATED)
Heparin Unfractionated: 0.71 IU/mL — ABNORMAL HIGH (ref 0.30–0.70)
Heparin Unfractionated: 0.94 IU/mL — ABNORMAL HIGH (ref 0.30–0.70)

## 2020-01-28 MED ORDER — BISACODYL 10 MG RE SUPP
10.0000 mg | Freq: Once | RECTAL | Status: AC
  Administered 2020-01-28: 10 mg via RECTAL
  Filled 2020-01-28: qty 1

## 2020-01-28 MED ORDER — POLYETHYLENE GLYCOL 3350 17 G PO PACK
17.0000 g | PACK | Freq: Two times a day (BID) | ORAL | Status: DC
  Administered 2020-01-28 – 2020-02-01 (×6): 17 g via ORAL
  Filled 2020-01-28 (×7): qty 1

## 2020-01-28 NOTE — Progress Notes (Addendum)
   Pre-op orders placed for 01/29/2020 revision left BKA to AKA by Dr. Trula Slade.      Roxy Horseman PA-C  Agree with above.  AKA tomorrow  [name], MD [facility] [place] Office: [phone]

## 2020-01-28 NOTE — H&P (View-Only) (Signed)
   Pre-op orders placed for 01/29/2020 revision left BKA to AKA by Dr. Trula Slade.      Roxy Horseman PA-C  Agree with above.  AKA tomorrow  [name], MD [facility] [place] Office: [phone]

## 2020-01-28 NOTE — Progress Notes (Signed)
ANTICOAGULATION CONSULT NOTE  Pharmacy Consult for Heparin  Indication: ischemic limb, multiple occluded LLE arteries  No Known Allergies  Patient Measurements: Height: 5\' 7"  (170.2 cm) Weight: 98.6 kg (217 lb 6 oz) IBW/kg (Calculated) : 66.1 HEPARIN DW (KG): 87.4   Vital Signs: Temp: 98.7 F (37.1 C) (10/27 0700) Temp Source: Oral (10/27 0700) BP: 126/66 (10/27 0830) Pulse Rate: 92 (10/27 0830)  Labs: Recent Labs    01/26/20 0333 01/26/20 1419 01/27/20 0045 01/27/20 0045 01/27/20 1456 01/27/20 1612 01/28/20 0000 01/28/20 0916  HGB 7.8*  --  7.6*  --   --   --  7.5*  --   HCT 24.9*  --  23.7*  --   --   --  23.4*  --   PLT 675*  --  691*  --   --   --  712*  --   APTT  --    < > 61*   < >  --  49* 63* 68*  HEPARINUNFRC  --    < > 0.98*   < > 0.81*  --  0.71* 0.94*  CREATININE 0.68  --   --   --   --   --  0.70  --   CKTOTAL  --   --   --   --   --   --  509*  --    < > = values in this interval not displayed.    Estimated Creatinine Clearance: 108.5 mL/min (by C-G formula based on SCr of 0.7 mg/dL).   Medical History: Past Medical History:  Diagnosis Date  . Diabetes mellitus without complication ([place])   . GERD (gastroesophageal reflux disease)   . Gout   . Hyperlipidemia   . Hypertension   . Insomnia   . Osteoarthritis      Assessment: 59 yo M with hx L BKA admitted for ischemic LLE and thrombus s/p embolectomy 10/18. 10/19 US shows occlusion in the LLE superficial femoral, popliteal and posterior tibial arteries. Apixaban was started 10/20 and now plans are for revision of left BKA to AKA. Pharmacy to dose heparin (last dose of apixaban was 10/24 ~ 10pm). -Heparin level still altered by DOAC, aPTT is therapeutic at 68 seconds.  -Hg= 7.5 (low/stable) -plans for AKA 10/28 ~ 7am   Goal of Therapy:  Heparin level 0.3-0.7 units/ml aPTT 66-102 seconds Monitor platelets by anticoagulation protocol: Yes   Plan:  -Continue heparin at 2450  units/hr -Stop heparin at 2am on 10/28 for OR -Will follow plans post OR on 10/28  Hildred Laser, PharmD Clinical Pharmacist **Pharmacist phone directory can now be found on [email or web address] ([email or web address]).  Listed under [place].

## 2020-01-28 NOTE — Progress Notes (Signed)
ANTICOAGULATION CONSULT NOTE  Pharmacy Consult for Heparin  Indication: ischemic limb, multiple occluded LLE arteries  No Known Allergies  Patient Measurements: Height: 5\' 7"  (170.2 cm) Weight: 98.6 kg (217 lb 6 oz) IBW/kg (Calculated) : 66.1 HEPARIN DW (KG): 87.4   Vital Signs: Temp: 98.5 F (36.9 C) (10/27 0007) Temp Source: Oral (10/27 0007) BP: 115/65 (10/27 0007) Pulse Rate: 86 (10/27 0007)  Labs: Recent Labs    01/26/20 0333 01/26/20 1419 01/27/20 0045 01/27/20 1456 01/27/20 1612 01/28/20 0000  HGB 7.8*  --  7.6*  --   --  7.5*  HCT 24.9*  --  23.7*  --   --  23.4*  PLT 675*  --  691*  --   --  712*  APTT  --    < > 61*  --  49* 63*  HEPARINUNFRC  --    < > 0.98* 0.81*  --  0.71*  CREATININE 0.68  --   --   --   --  0.70  CKTOTAL  --   --   --   --   --  509*   < > = values in this interval not displayed.    Estimated Creatinine Clearance: 108.5 mL/min (by C-G formula based on SCr of 0.7 mg/dL).   Medical History: Past Medical History:  Diagnosis Date  . Diabetes mellitus without complication ([place])   . GERD (gastroesophageal reflux disease)   . Gout   . Hyperlipidemia   . Hypertension   . Insomnia   . Osteoarthritis      Assessment: 59 yo M with hx L BKA admitted for ischemic LLE and thrombus s/p embolectomy 10/18. 10/19 US shows occlusion in the LLE superficial femoral, popliteal and posterior tibial arteries. Apixaban was started 10/20 and now plans are for revision of left BKA to AKA. Pharmacy to dose heparin (last dose of apixaban was 10/24 ~ 10pm).  Heparin level still altered by DOAC, aPTT is subtherapeutic at 49 seconds. No infusion issues per nursing throughout the shift.  10/27 AM update:  APTT remains below goal No issues per RN  Goal of Therapy:  Heparin level 0.3-0.7 units/ml aPTT 66-102 seconds Monitor platelets by anticoagulation protocol: Yes   Plan:  -Increase heparin to 2450 units/hr -Recheck aPTT/heparin level in 8  hours  Narda Bonds, PharmD, [place] Pharmacist Phone: [phone]

## 2020-01-28 NOTE — Progress Notes (Signed)
Occupational Therapy Treatment Patient Details Name: [name] MRN: [id number] DOB: Apr 29, 1960 Today's Date: 01/28/2020    History of present illness Pt is a 59 y.o. male with medical history significant for hypertension, hyperlipidemia, insulin-dependent diabetes mellitus, history of left BKA, and recent admission with acute encephalopathy suspected secondary to COVID-19, presented to the ED with pain at his left BKA stump and buttock. Pt found to have acute ischemia left leg. Now s/p left iliofemoral embolectomy on 01/19/20.   OT comments  Patient with increased pain to L residual leg limiting ability to participate.  Able to sit edge of bed for a couple minutes and wash hands/face with setup.  OT educated patient regarding rubbing and tapping stump for pain management.  Patient had a lot of questions regarding rehab and his ability to use his existing prostheses.  He appears to be scheduled for a revision to an AKA.  OT to reassess patient after procedure as appropriate.  SNF post acute for rehab is recommended.     Follow Up Recommendations  SNF;Supervision/Assistance - 24 hour    Equipment Recommendations       Recommendations for Other Services      Precautions / Restrictions Precautions Precautions: Fall Restrictions Other Position/Activity Restrictions: L BKA - scheduled for L AKA 10/28       Mobility Bed Mobility Overal bed mobility: Needs Assistance Bed Mobility: Supine to Sit;Sit to Supine     Supine to sit: Min assist;HOB elevated Sit to supine: Min assist;HOB elevated                       General transfer comment: declined OOB to recliner due to pain.    Balance   Sitting-balance support: Single extremity supported Sitting balance-Leahy Scale: Good Sitting balance - Comments: seated at EOB                                   ADL either performed or assessed with clinical judgement   ADL       Grooming: Wash/dry  hands;Wash/dry face;Supervision/safety;Sitting                                                       General Comments      Pertinent Vitals/ Pain       Faces Pain Scale: Hurts whole lot Pain Location: LLE Pain Descriptors / Indicators: Discomfort;Grimacing;Guarding;Throbbing Pain Intervention(s): Monitored during session;Repositioned                                                          Frequency  Min 2X/week        Progress Toward Goals  OT Goals(current goals can now be found in the care plan section)  Progress towards OT goals: OT to reassess next treatment  Acute Rehab OT Goals Patient Stated Goal: I'd like to use my leg and walk again. OT Goal Formulation: With patient Time For Goal Achievement: 02/03/20 Potential to Achieve Goals: Poor  Plan      Co-evaluation  AM-PAC OT "6 Clicks" Daily Activity     Outcome Measure   Help from another person eating meals?: None Help from another person taking care of personal grooming?: None Help from another person toileting, which includes using toliet, bedpan, or urinal?: A Little Help from another person bathing (including washing, rinsing, drying)?: A Lot Help from another person to put on and taking off regular upper body clothing?: A Little Help from another person to put on and taking off regular lower body clothing?: A Lot 6 Click Score: 18    End of Session    OT Visit Diagnosis: Other abnormalities of gait and mobility (R26.89);Muscle weakness (generalized) (M62.81);Pain;History of falling (Z91.81) Pain - Right/Left: Left Pain - part of body: Leg   Activity Tolerance Patient limited by pain   Patient Left with call bell/phone within reach;in bed   Nurse Communication Other (comment) (pain level)        Time: 6546-5035 OT Time Calculation (min): 20 min  Charges: OT General Charges $OT Visit: 1 Visit OT Treatments $Self  Care/Home Management : 8-22 mins  01/28/2020  Rich, OTR/L  Acute Rehabilitation Services  Office:  [phone]    Metta Clines 01/28/2020, 2:41 PM

## 2020-01-28 NOTE — Anesthesia Preprocedure Evaluation (Signed)
Anesthesia Evaluation    Reviewed: Allergy & Precautions, Patient's Chart, lab work & pertinent test results, Unable to perform ROS - Chart review only  Airway Mallampati: II  TM Distance: >3 FB Neck ROM: Full    Dental  (+) Dental Advisory Given   Pulmonary neg pulmonary ROS, Current Smoker, former smoker,    Pulmonary exam normal breath sounds clear to auscultation       Cardiovascular hypertension, Pt. on medications and Pt. on home beta blockers + Peripheral Vascular Disease  Normal cardiovascular exam Rhythm:Regular Rate:Normal     Neuro/Psych negative psych ROS   GI/Hepatic Neg liver ROS, GERD  ,  Endo/Other  diabetes, Poorly Controlled, Type 2  Renal/GU negative Renal ROS     Musculoskeletal  (+) Arthritis ,   Abdominal   Peds  Hematology  (+) anemia ,   Anesthesia Other Findings   Reproductive/Obstetrics                             Anesthesia Physical  Anesthesia Plan  ASA: III  Anesthesia Plan: General   Post-op Pain Management: GA combined w/ Regional for post-op pain   Induction: Intravenous  PONV Risk Score and Plan: 0 and Ondansetron and Treatment may vary due to age or medical condition  Airway Management Planned:   Additional Equipment:   Intra-op Plan:   Post-operative Plan:   Informed Consent:   Plan Discussed with:   Anesthesia Plan Comments:         Anesthesia Quick Evaluation

## 2020-01-28 NOTE — Progress Notes (Signed)
PROGRESS NOTE    [name]  ION:[id number] DOB: 04-Aug-1960 DOA: 01/19/2020 PCP: Vidal Schwalbe, MD   Chief Complaint  Patient presents with  . Leg Pain    Brief Narrative:    59 year old gentleman prior history of left BKA, recent COVID-19 pneumonia, diabetes mellitus, insulin-dependent presents with severe pain in the left BKA stump antibiotics.  He was admitted for acute left lower extremity ischemia underwent emergent iliofemoral embolectomy.He was started on eliquis for the LL ischemic limb, but later on transitioned to IV heparin as patient agreed to undergo AKA to be scheduled this week. Meanwhile he has persistent leukocytosis, and his left groin incision wound with some drainage. Cultures from the wound sent showing rare wbc seen with some gram positive cocci,. He was empirically started on keflex by vascular surgery.  T-max of 99.2, leukocytosis improved to 13,000.    Assessment & Plan:   Principal Problem:   Ischemic leg Active Problems:   Normocytic anemia   Essential hypertension   DM type 2 causing vascular disease (HCC)   Rhabdomyolysis   Lower limb ischemia   Acute left lower extremity ischemia Duplex shows distal BKA SFA occlusion Echocardiogram does not show any source of thrombus. S/p emergent embolectomy, left iliofemoral on 01/19/2020. Plan for revision to AKA , pt agreeable.  On IV heparin.  Pain is tolerable with medications.   Hyponatremia:  Mild , ? Hyperglycemia.  Serum osmo Is 280, will get urine sodium, tsh and am cortisol.    Rhabdomyolysis:  Improved.  CK is 509  Elevated liver enzymes:  Liver enzymes are improving Patient denies any nausea vomiting or abdominal pain   Anemia of blood loss from the surgery superimposed on anemia of chronic disease Baseline hemoglobin around 9 currently at 7.5 .  Anemia panel showed iron deficiency anemia with adequate and folate levels.  Iron supplementation will be added Transfuse to keep  hemoglobin greater than 7. He will probably need  1to 2 units of prbc transfusion prior to the AKA.    Essential hypertension Blood pressure parameters are optimal   Type 2 diabetes mellitus CBG (last 3)  Recent Labs    01/27/20 2205 01/28/20 0630 01/28/20 1111  GLUCAP 218* 169* 185*   Increased lantus to 48 units, continue with mod SSI and novolog 4 units TIDAC.  CBGs are better controlled today    Leukocytosis:  ? From left groin wound.  Procalcitonin level 0.13  Constipation Stool softeners and laxatives ordered. Abdominal x-ray showed moderate stool burden without any obstruction. Some Dulcolax suppository added and MiraLAX changed to twice daily.  DVT prophylaxis: Heparin.  Code Status: full code Family Communication: (none at bedside.  Disposition:   Status is: Inpatient  Remains inpatient appropriate because:Ongoing diagnostic testing needed not appropriate for outpatient work up, Unsafe d/c plan and IV treatments appropriate due to intensity of illness or inability to take PO   Dispo: The patient is from: Home              Anticipated d/c is to: SNF              Anticipated d/c date is: > 3 days              Patient currently is not medically stable to d/c.       Consultants:   Vascular surgery.   Procedures:S/p emergent embolectomy, left iliofemoral on 01/19/2020.  Antimicrobials: Kefelex   Subjective: No bowel movement since 1 week  Objective: Vitals:   01/28/20  0408 01/28/20 0700 01/28/20 0830 01/28/20 1115  BP: 118/66 118/74 126/66 119/73  Pulse: 90 86 92   Resp: 20 18    Temp: 98.6 F (37 C) 98.7 F (37.1 C)  98.4 F (36.9 C)  TempSrc: Oral Oral  Oral  SpO2: 95% 97%  97%  Weight:      Height:        Intake/Output Summary (Last 24 hours) at 01/28/2020 1412 Last data filed at 01/28/2020 3419 Gross per 24 hour  Intake 120 ml  Output 2900 ml  Net -2780 ml   Filed Weights   01/19/20 1448 01/24/20 0413 01/24/20 1551  Weight:  118.8 kg 98.6 kg 98.6 kg    Examination:  General exam: Alert in bed comfortable not in any kind of distress Respiratory system: Air entry fair bilateral, no wheezing or rhonchi Cardiovascular system: S1-S2 heard, regular rate rhythm, no JVD Gastrointestinal system: Abdomen is soft, nontender bowel sounds normal Central nervous system: Alert and oriented, grossly nonfocal Extremities: Left BKA with small blebs on the stump Skin: Left groin incision wound Psychiatry: mood is appropriate   Data Reviewed: I have personally reviewed following labs and imaging studies  CBC: Recent Labs  Lab 01/22/20 0035 01/26/20 0333 01/27/20 0045 01/28/20 0000  WBC 13.7* 15.4* 14.5* 13.3*  NEUTROABS 9.1*  --   --   --   HGB 8.3* 7.8* 7.6* 7.5*  HCT 26.7* 24.9* 23.7* 23.4*  MCV 86.7 83.8 83.7 83.3  PLT 446* 675* 691* 712*    Basic Metabolic Panel: Recent Labs  Lab 01/22/20 0035 01/23/20 0101 01/26/20 0333 01/28/20 0000  NA 133* 132* 131* 128*  K 4.8 3.9 4.4 4.5  CL 100 98 97* 94*  CO2 25 24 23 24   GLUCOSE 147* 220* 183* 230*  BUN 8 7* 10 9  CREATININE 0.63 0.64 0.68 0.70  CALCIUM 8.4* 8.6* 8.8* 8.5*    GFR: Estimated Creatinine Clearance: 108.5 mL/min (by C-G formula based on SCr of 0.7 mg/dL).  Liver Function Tests: Recent Labs  Lab 01/28/20 0000  AST 46*  ALT 96*  ALKPHOS 165*  BILITOT 0.4  PROT 7.7  ALBUMIN 1.9*    CBG: Recent Labs  Lab 01/27/20 1143 01/27/20 1730 01/27/20 2205 01/28/20 0630 01/28/20 1111  GLUCAP 200* 235* 218* 169* 185*     Recent Results (from the past 240 hour(s))  Aerobic Culture (superficial specimen)     Status: None (Preliminary result)   Collection Time: 01/26/20  7:56 AM   Specimen: Wound  Result Value Ref Range Status   Specimen Description WOUND LEFT GROIN  Final   Special Requests NONE  Final   Gram Stain   Final    RARE WBC PRESENT, PREDOMINANTLY MONONUCLEAR FEW GRAM POSITIVE COCCI RARE GRAM NEGATIVE RODS    Culture    Final    FEW SERRATIA MARCESCENS FEW ENTEROCOCCUS FAECALIS SUSCEPTIBILITIES TO FOLLOW Performed at [facility], [place] [address], [place], [place] [address]    Report Status PENDING  Incomplete  MRSA PCR Screening     Status: None   Collection Time: 01/27/20 12:30 PM   Specimen: Nasal Mucosa; Nasopharyngeal  Result Value Ref Range Status   MRSA by PCR NEGATIVE NEGATIVE Final    Comment:        The GeneXpert MRSA Assay (FDA approved for NASAL specimens only), is one component of a comprehensive MRSA colonization surveillance program. It is not intended to diagnose MRSA infection nor to guide or monitor treatment for  MRSA infections. Performed at [facility], [place] [address] [place],  [address]          Radiology Studies: DG Abd 2 Views  Result Date: 01/28/2020 CLINICAL DATA:  Constipation EXAM: ABDOMEN - 2 VIEW COMPARISON:  CT abdomen and pelvis January 08, 2020 FINDINGS: Supine and upright images were obtained. There is fairly diffuse stool throughout colon. The colon is not distended due to stool. There is no bowel dilatation or air-fluid level to suggest bowel obstruction. No free air. Lung bases are clear. Scattered foci of arterial vascular calcification noted in the pelvis. IMPRESSION: Fairly diffuse stool throughout colon without colonic distension from stool. No bowel obstruction or free air evident. Lung bases are clear. Electronically Signed   By: Lowella Grip III M.D.   On: 01/28/2020 13:26        Scheduled Meds: . amitriptyline  25 mg Oral QHS  . amLODipine  10 mg Oral Daily  . aspirin EC  81 mg Oral Q0600  . cephALEXin  500 mg Oral TID  . docusate sodium  100 mg Oral Daily  . insulin aspart  0-15 Units Subcutaneous TID WC  . insulin aspart  0-5 Units Subcutaneous QHS  . insulin aspart  4 Units Subcutaneous TID WC  . insulin glargine  48 Units Subcutaneous QHS  . metoprolol succinate  25 mg Oral Daily  . pantoprazole  40 mg Oral  Daily  . polyethylene glycol  17 g Oral BID  . rosuvastatin  20 mg Oral Daily   Continuous Infusions: . sodium chloride    . heparin 2,450 Units/hr (01/28/20 0451)  . magnesium sulfate bolus IVPB       LOS: 8 days       Hosie Poisson, MD [facility]   To contact the attending provider between 7A-7P or the covering provider during after hours 7P-7A, please log into the web site [email or web address] and access using universal [place] password for that web site. If you do not have the password, please call the hospital operator.  01/28/2020, 2:12 PM

## 2020-01-29 ENCOUNTER — Inpatient Hospital Stay (HOSPITAL_COMMUNITY): Payer: Medicaid Other | Admitting: Certified Registered Nurse Anesthetist

## 2020-01-29 ENCOUNTER — Encounter (HOSPITAL_COMMUNITY)
Admission: EM | Disposition: A | Discharge status: Skilled Nursing Facility | Payer: Self-pay | Source: Home / Self Care | Attending: Internal Medicine

## 2020-01-29 DIAGNOSIS — I998 Other disorder of circulatory system: Secondary | ICD-10-CM | POA: Diagnosis not present

## 2020-01-29 DIAGNOSIS — T8789 Other complications of amputation stump: Secondary | ICD-10-CM

## 2020-01-29 HISTORY — PX: AMPUTATION: SHX166

## 2020-01-29 LAB — AEROBIC CULTURE W GRAM STAIN (SUPERFICIAL SPECIMEN)

## 2020-01-29 LAB — CBC
HCT: 23.8 % — ABNORMAL LOW (ref 39.0–52.0)
Hemoglobin: 7.6 g/dL — ABNORMAL LOW (ref 13.0–17.0)
MCH: 27.1 pg (ref 26.0–34.0)
MCHC: 31.9 g/dL (ref 30.0–36.0)
MCV: 85 fL (ref 80.0–100.0)
Platelets: 795 10*3/uL — ABNORMAL HIGH (ref 150–400)
RBC: 2.8 MIL/uL — ABNORMAL LOW (ref 4.22–5.81)
RDW: 14.1 % (ref 11.5–15.5)
WBC: 13.9 10*3/uL — ABNORMAL HIGH (ref 4.0–10.5)
nRBC: 0 % (ref 0.0–0.2)

## 2020-01-29 LAB — BASIC METABOLIC PANEL
Anion gap: 10 (ref 5–15)
BUN: 9 mg/dL (ref 8–23)
CO2: 26 mmol/L (ref 22–32)
Calcium: 9.2 mg/dL (ref 8.9–10.3)
Chloride: 95 mmol/L — ABNORMAL LOW (ref 98–111)
Creatinine, Ser: 0.82 mg/dL (ref 0.61–1.24)
GFR, Estimated: >60 mL/min (ref >60)
Glucose, Bld: 252 mg/dL — ABNORMAL HIGH (ref 70–99)
Potassium: 4.8 mmol/L (ref 3.5–5.1)
Sodium: 131 mmol/L — ABNORMAL LOW (ref 135–145)

## 2020-01-29 LAB — GLUCOSE, CAPILLARY
Glucose-Capillary: 180 mg/dL — ABNORMAL HIGH (ref 70–99)
Glucose-Capillary: 199 mg/dL — ABNORMAL HIGH (ref 70–99)
Glucose-Capillary: 161 mg/dL — ABNORMAL HIGH (ref 70–99)
Glucose-Capillary: 221 mg/dL — ABNORMAL HIGH (ref 70–99)
Glucose-Capillary: 305 mg/dL — ABNORMAL HIGH (ref 70–99)
Glucose-Capillary: 327 mg/dL — ABNORMAL HIGH (ref 70–99)

## 2020-01-29 LAB — PREPARE RBC (CROSSMATCH)

## 2020-01-29 LAB — APTT: aPTT: 61 seconds — ABNORMAL HIGH (ref 24–36)

## 2020-01-29 LAB — HEPARIN LEVEL (UNFRACTIONATED): Heparin Unfractionated: 0.37 IU/mL (ref 0.30–0.70)

## 2020-01-29 LAB — PROTIME-INR
INR: 1.2 (ref 0.8–1.2)
Prothrombin Time: 15.1 seconds (ref 11.4–15.2)

## 2020-01-29 LAB — PROCALCITONIN: Procalcitonin: <0.1 ng/mL

## 2020-01-29 SURGERY — AMPUTATION, ABOVE KNEE
Anesthesia: General | Site: Leg Upper | Laterality: Left

## 2020-01-29 MED ORDER — BISACODYL 10 MG RE SUPP
10.0000 mg | Freq: Once | RECTAL | Status: AC
  Administered 2020-01-29: 10 mg via RECTAL
  Filled 2020-01-29: qty 1

## 2020-01-29 MED ORDER — INSULIN GLARGINE 100 UNIT/ML ~~LOC~~ SOLN
55.0000 [IU] | Freq: Every day | SUBCUTANEOUS | Status: DC
  Administered 2020-01-29: 55 [IU] via SUBCUTANEOUS
  Filled 2020-01-29 (×2): qty 0.55

## 2020-01-29 MED ORDER — LACTATED RINGERS IV SOLN: INTRAVENOUS | Status: DC | PRN

## 2020-01-29 MED ORDER — MIDAZOLAM HCL 2 MG/2ML IJ SOLN
INTRAMUSCULAR | Status: AC
  Filled 2020-01-29: qty 2

## 2020-01-29 MED ORDER — HYDROMORPHONE HCL 1 MG/ML IJ SOLN
INTRAMUSCULAR | Status: AC
  Administered 2020-01-29: 0.5 mg via INTRAVENOUS
  Filled 2020-01-29: qty 1

## 2020-01-29 MED ORDER — LIDOCAINE 2% (20 MG/ML) 5 ML SYRINGE
INTRAMUSCULAR | Status: DC | PRN
  Administered 2020-01-29: 100 mg via INTRAVENOUS

## 2020-01-29 MED ORDER — DEXAMETHASONE SODIUM PHOSPHATE 10 MG/ML IJ SOLN
INTRAMUSCULAR | Status: DC | PRN
  Administered 2020-01-29: 5 mg via INTRAVENOUS

## 2020-01-29 MED ORDER — PROPOFOL 10 MG/ML IV BOLUS
INTRAVENOUS | Status: AC
  Filled 2020-01-29: qty 20

## 2020-01-29 MED ORDER — HYDROMORPHONE HCL 1 MG/ML IJ SOLN
0.2500 mg | INTRAMUSCULAR | Status: DC | PRN
  Administered 2020-01-29: 0.5 mg via INTRAVENOUS

## 2020-01-29 MED ORDER — ONDANSETRON HCL 4 MG/2ML IJ SOLN
INTRAMUSCULAR | Status: AC
  Filled 2020-01-29: qty 2

## 2020-01-29 MED ORDER — CEFAZOLIN SODIUM 1 G IJ SOLR
INTRAMUSCULAR | Status: AC
  Filled 2020-01-29: qty 20

## 2020-01-29 MED ORDER — ONDANSETRON HCL 4 MG/2ML IJ SOLN
INTRAMUSCULAR | Status: DC | PRN
  Administered 2020-01-29: 4 mg via INTRAVENOUS

## 2020-01-29 MED ORDER — OXYCODONE HCL 5 MG PO TABS: 5.0000 mg | ORAL_TABLET | Freq: Once | ORAL | Status: DC | PRN

## 2020-01-29 MED ORDER — PROPOFOL 10 MG/ML IV BOLUS
INTRAVENOUS | Status: DC | PRN
  Administered 2020-01-29: 30 mg via INTRAVENOUS
  Administered 2020-01-29: 50 mg via INTRAVENOUS
  Administered 2020-01-29: 150 mg via INTRAVENOUS

## 2020-01-29 MED ORDER — SODIUM CHLORIDE 0.9% IV SOLUTION: Freq: Once | INTRAVENOUS | Status: DC

## 2020-01-29 MED ORDER — HEPARIN (PORCINE) 25000 UT/250ML-% IV SOLN
2300.0000 [IU]/h | INTRAVENOUS | Status: DC
  Administered 2020-01-29: 2100 [IU]/h via INTRAVENOUS
  Administered 2020-01-30 – 2020-02-01 (×4): 2300 [IU]/h via INTRAVENOUS
  Filled 2020-01-29 (×6): qty 250

## 2020-01-29 MED ORDER — CEFAZOLIN SODIUM-DEXTROSE 2-3 GM-%(50ML) IV SOLR
INTRAVENOUS | Status: DC | PRN
  Administered 2020-01-29: 2 g via INTRAVENOUS

## 2020-01-29 MED ORDER — FENTANYL CITRATE (PF) 250 MCG/5ML IJ SOLN
INTRAMUSCULAR | Status: DC | PRN
  Administered 2020-01-29 (×5): 25 ug via INTRAVENOUS
  Administered 2020-01-29: 50 ug via INTRAVENOUS
  Administered 2020-01-29 (×3): 25 ug via INTRAVENOUS

## 2020-01-29 MED ORDER — BUPIVACAINE-EPINEPHRINE (PF) 0.5% -1:200000 IJ SOLN
INTRAMUSCULAR | Status: DC | PRN
  Administered 2020-01-29: 25 mL via PERINEURAL
  Administered 2020-01-29: 15 mL via PERINEURAL

## 2020-01-29 MED ORDER — DEXAMETHASONE SODIUM PHOSPHATE 10 MG/ML IJ SOLN
INTRAMUSCULAR | Status: AC
  Filled 2020-01-29: qty 1

## 2020-01-29 MED ORDER — CLONIDINE HCL (ANALGESIA) 100 MCG/ML EP SOLN
EPIDURAL | Status: DC | PRN
  Administered 2020-01-29: 50 ug
  Administered 2020-01-29: 30 ug

## 2020-01-29 MED ORDER — FENTANYL CITRATE (PF) 250 MCG/5ML IJ SOLN
INTRAMUSCULAR | Status: AC
  Filled 2020-01-29: qty 5

## 2020-01-29 MED ORDER — MEPERIDINE HCL 25 MG/ML IJ SOLN: 6.2500 mg | INTRAMUSCULAR | Status: DC | PRN

## 2020-01-29 MED ORDER — HYDROMORPHONE HCL 1 MG/ML IJ SOLN
INTRAMUSCULAR | Status: AC
  Filled 2020-01-29: qty 1

## 2020-01-29 MED ORDER — LIDOCAINE 2% (20 MG/ML) 5 ML SYRINGE
INTRAMUSCULAR | Status: AC
  Filled 2020-01-29: qty 5

## 2020-01-29 MED ORDER — ONDANSETRON HCL 4 MG/2ML IJ SOLN: 4.0000 mg | Freq: Once | INTRAMUSCULAR | Status: DC | PRN

## 2020-01-29 MED ORDER — OXYCODONE-ACETAMINOPHEN 5-325 MG PO TABS
ORAL_TABLET | ORAL | Status: AC
  Filled 2020-01-29: qty 2

## 2020-01-29 MED ORDER — MIDAZOLAM HCL 5 MG/5ML IJ SOLN
INTRAMUSCULAR | Status: DC | PRN
  Administered 2020-01-29 (×2): 1 mg via INTRAVENOUS

## 2020-01-29 MED ORDER — 0.9 % SODIUM CHLORIDE (POUR BTL) OPTIME
TOPICAL | Status: DC | PRN
  Administered 2020-01-29: 1000 mL

## 2020-01-29 MED ORDER — OXYCODONE HCL 5 MG/5ML PO SOLN: 5.0000 mg | Freq: Once | ORAL | Status: DC | PRN

## 2020-01-29 SURGICAL SUPPLY — 52 items
BLADE SAW GIGLI 510 (BLADE) ×2 IMPLANT
BLADE SAW GIGLI 510MM (BLADE) ×1
BNDG COHESIVE 6X5 TAN STRL LF (GAUZE/BANDAGES/DRESSINGS) ×1 IMPLANT
BNDG ELASTIC 4X5.8 VLCR STR LF (GAUZE/BANDAGES/DRESSINGS) ×3 IMPLANT
BNDG ELASTIC 6X5.8 VLCR STR LF (GAUZE/BANDAGES/DRESSINGS) ×3 IMPLANT
BNDG GAUZE ELAST 4 BULKY (GAUZE/BANDAGES/DRESSINGS) ×4 IMPLANT
CANISTER SUCT 3000ML PPV (MISCELLANEOUS) ×3 IMPLANT
CLIP VESOCCLUDE MED 6/CT (CLIP) ×5 IMPLANT
COVER SURGICAL LIGHT HANDLE (MISCELLANEOUS) ×5 IMPLANT
COVER WAND RF STERILE (DRAPES) ×1 IMPLANT
DRAIN CHANNEL 19F RND (DRAIN) IMPLANT
DRAPE HALF SHEET 40X57 (DRAPES) ×3 IMPLANT
DRAPE INCISE IOBAN 66X45 STRL (DRAPES) IMPLANT
DRAPE ORTHO SPLIT 77X108 STRL (DRAPES) ×4
DRAPE SURG ORHT 6 SPLT 77X108 (DRAPES) ×2 IMPLANT
DRESSING PREVENA PLUS CUSTOM (GAUZE/BANDAGES/DRESSINGS) IMPLANT
DRSG ADAPTIC 3X8 NADH LF (GAUZE/BANDAGES/DRESSINGS) ×3 IMPLANT
DRSG PREVENA PLUS CUSTOM (GAUZE/BANDAGES/DRESSINGS)
ELECT CAUTERY BLADE 6.4 (BLADE) ×3 IMPLANT
ELECT REM PT RETURN 9FT ADLT (ELECTROSURGICAL) ×3
ELECTRODE REM PT RTRN 9FT ADLT (ELECTROSURGICAL) ×1 IMPLANT
EVACUATOR SILICONE 100CC (DRAIN) IMPLANT
GAUZE 4X4 16PLY RFD (DISPOSABLE) ×1 IMPLANT
GAUZE SPONGE 4X4 12PLY STRL (GAUZE/BANDAGES/DRESSINGS) ×4 IMPLANT
GLOVE BIOGEL PI IND STRL 7.5 (GLOVE) ×1 IMPLANT
GLOVE BIOGEL PI INDICATOR 7.5 (GLOVE) ×4
GLOVE ECLIPSE 7.0 STRL STRAW (GLOVE) ×2 IMPLANT
GLOVE SURG SS PI 7.5 STRL IVOR (GLOVE) ×3 IMPLANT
GOWN STRL REUS W/ TWL LRG LVL3 (GOWN DISPOSABLE) ×2 IMPLANT
GOWN STRL REUS W/ TWL XL LVL3 (GOWN DISPOSABLE) ×1 IMPLANT
GOWN STRL REUS W/TWL LRG LVL3 (GOWN DISPOSABLE) ×4
GOWN STRL REUS W/TWL XL LVL3 (GOWN DISPOSABLE) ×2
KIT BASIN OR (CUSTOM PROCEDURE TRAY) ×3 IMPLANT
KIT TURNOVER KIT B (KITS) ×3 IMPLANT
NS IRRIG 1000ML POUR BTL (IV SOLUTION) ×3 IMPLANT
PACK GENERAL/GYN (CUSTOM PROCEDURE TRAY) ×3 IMPLANT
PAD ARMBOARD 7.5X6 YLW CONV (MISCELLANEOUS) ×6 IMPLANT
PREVENA RESTOR ARTHOFORM 46X30 (CANNISTER) IMPLANT
STAPLER VISISTAT 35W (STAPLE) ×3 IMPLANT
STOCKINETTE IMPERVIOUS LG (DRAPES) ×1 IMPLANT
SUT ETHILON 3 0 PS 1 (SUTURE) IMPLANT
SUT SILK 0 TIES 10X30 (SUTURE) ×3 IMPLANT
SUT SILK 2 0 (SUTURE)
SUT SILK 2-0 18XBRD TIE 12 (SUTURE) IMPLANT
SUT SILK 3 0 (SUTURE) ×2
SUT SILK 3-0 18XBRD TIE 12 (SUTURE) IMPLANT
SUT VIC AB 2-0 CT1 18 (SUTURE) ×8 IMPLANT
SYR BULB IRRIG 60ML STRL (SYRINGE) ×2 IMPLANT
TAPE UMBILICAL COTTON 1/8X30 (MISCELLANEOUS) ×2 IMPLANT
TOWEL GREEN STERILE (TOWEL DISPOSABLE) ×3 IMPLANT
UNDERPAD 30X36 HEAVY ABSORB (UNDERPADS AND DIAPERS) ×3 IMPLANT
WATER STERILE IRR 1000ML POUR (IV SOLUTION) ×3 IMPLANT

## 2020-01-29 NOTE — Progress Notes (Signed)
Mobility Specialist - Progress Note   01/29/20 1509  Mobility  Activity Stood at bedside  Level of Assistance Moderate assist, patient does 50-74%  Assistive Device Front wheel walker  Mobility Response Tolerated fair  Mobility performed by Mobility specialist  $Mobility charge 1 Mobility    Pre-mobility: 97 HR, 137/82 BP, 100% SpO2 During mobility: 107 HR Post-mobility: 100 HR, 134/76 BP, 99% SpO2  Pt able to stand 2x for ~10 second bouts. He struggled w/ balance due to feeling his phantom limb. Pt back in bed after ambulation.   Pricilla Handler Mobility Specialist Mobility Specialist Phone: [phone]

## 2020-01-29 NOTE — Anesthesia Procedure Notes (Signed)
Anesthesia Regional Block: Popliteal block (sciatic block)   Pre-Anesthetic Checklist: ,, timeout performed, Correct Patient, Correct Site, Correct Laterality, Correct Procedure, Correct Position, site marked, Risks and benefits discussed,  Surgical consent,  Pre-op evaluation,  At surgeon's request and post-op pain management  Laterality: Left  Prep: chloraprep       Needles:  Injection technique: Single-shot  Needle Type: Stimiplex     Needle Length: 10cm  Needle Gauge: 21     Additional Needles:   Procedures:,,,, ultrasound used (permanent image in chart),,,,  Motor weakness within 5 minutes.  Narrative:  Start time: 01/29/2020 7:55 AM End time: 01/29/2020 8:00 AM Injection made incrementally with aspirations every 5 mL.  Performed by: Personally  Anesthesiologist: Nolon Nations, MD  Additional Notes: Nerve located and needle positioned with direct ultrasound guidance. Good perineural spread. Patient tolerated well.

## 2020-01-29 NOTE — Interval H&P Note (Signed)
History and Physical Interval Note:  01/29/2020 7:17 AM  [name]  has presented today for surgery, with the diagnosis of LOWER LIMB ISCHEMIA.  The various methods of treatment have been discussed with the patient and family. After consideration of risks, benefits and other options for treatment, the patient has consented to  Procedure(s): LEFT ABOVE KNEE AMPUTATION (Left) as a surgical intervention.  The patient's history has been reviewed, patient examined, no change in status, stable for surgery.  I have reviewed the patient's chart and labs.  Questions were answered to the patient's satisfaction.     [name]

## 2020-01-29 NOTE — Op Note (Signed)
    Patient name: [name] MRN: [id number] DOB: 1961-03-13 Sex: male  01/29/2020 Pre-operative Diagnosis: Nonhealing left below-knee amputation Post-operative diagnosis:  Same Surgeon:  [name] Assistants: Laurence Slate Procedure:   Left above-knee amputation Anesthesia: General Blood Loss: Minimal Specimens: Left leg  Findings: Muscle all appeared healthy and viable. There was significant subcutaneous edema.  Indications: The patient has a nonhealing ischemic left below-knee amputation with pain. He comes in today for conversion to an above-knee amputation  Procedure:  The patient was identified in the holding area and taken to [facility]  The patient was then placed supine on the table. general anesthesia was administered.  The patient was prepped and draped in the usual sterile fashion.  A time out was called and antibiotics were administered. A PA was necessary to expedite the procedure and assist with the technical details.  A fishmouth incision was made approximately 1 cm proximal to the blistering of the skin. Cautery was used about subcutaneous tissue down to the fascia which was opened with cautery. I then circumferentially exposed the femur. A periosteal elevator was used to elevate the periosteum and a Gigli saw was used to transect the bone. Cautery was used to divide the remaining muscle. The neurovascular bundle was isolated and divided between clamps. The leg was removed as a specimen. I then further dissected out the femoral nerve and ligated it with a silk tie proximal to the cut edge of the bone. Similarly, the popliteal artery and vein were ligated with a 0 silk tie proximal to the cut edge of the bone. The wound was then copiously irrigated. A rasp was used to smooth the bone surface edges. The fascia was then closed with interrupted 2-0 Vicryl sutures followed by staples. Sterile dressings were applied. There were no immediate complications.   Disposition:  To PACU stable.   Theotis Burrow, M.D., [facility] [place] Office: [phone] Pager:  [phone]

## 2020-01-29 NOTE — Anesthesia Procedure Notes (Signed)
Anesthesia Regional Block: Adductor canal block   Pre-Anesthetic Checklist: ,, timeout performed, Correct Patient, Correct Site, Correct Laterality, Correct Procedure, Correct Position, site marked, Risks and benefits discussed,  Surgical consent,  Pre-op evaluation,  At surgeon's request and post-op pain management  Laterality: Left  Prep: chloraprep       Needles:  Injection technique: Single-shot  Needle Type: Stimiplex     Needle Length: 9cm  Needle Gauge: 21     Additional Needles:   Procedures:,,,, ultrasound used (permanent image in chart),,,,  Narrative:  Start time: 01/29/2020 7:27 AM End time: 01/29/2020 7:32 AM Injection made incrementally with aspirations every 5 mL.  Performed by: Personally  Anesthesiologist: Nolon Nations, MD  Additional Notes: BP cuff, EKG monitors applied. Sedation begun. Artery and nerve location verified with U/S and anesthetic injected incrementally, slowly, and after negative aspirations under direct u/s guidance. Good fascial /perineural spread. Tolerated well.

## 2020-01-29 NOTE — Anesthesia Postprocedure Evaluation (Signed)
Anesthesia Post Note  Patient: [name]  Procedure(s) Performed: LEFT ABOVE KNEE AMPUTATION (Left Leg Upper)     Patient location during evaluation: PACU Anesthesia Type: General Level of consciousness: sedated and patient cooperative Pain management: pain level controlled Vital Signs Assessment: post-procedure vital signs reviewed and stable Respiratory status: spontaneous breathing Cardiovascular status: stable Anesthetic complications: no   No complications documented.  Last Vitals:  Vitals:   01/29/20 1503 01/29/20 1600  BP: 134/76 137/90  Pulse: (!) 101 95  Resp:  16  Temp:  37 C  SpO2: 100% 100%    Last Pain:  Vitals:   01/29/20 1648  TempSrc:   PainSc: [place]

## 2020-01-29 NOTE — Progress Notes (Signed)
ANTICOAGULATION CONSULT NOTE - Follow Up Consult  Pharmacy Consult for heparin Indication: ischemic limb with multiple occluded LLE  Labs: Recent Labs    01/27/20 0045 01/27/20 1456 01/28/20 0000 01/28/20 0916 01/29/20 0113 01/29/20 2229  HGB 7.6*  --  7.5*  --  7.6*  --   HCT 23.7*  --  23.4*  --  23.8*  --   PLT 691*  --  712*  --  795*  --   APTT 61*   < > 63* 68*  --  61*  LABPROT  --   --   --   --  15.1  --   INR  --   --   --   --  1.2  --   HEPARINUNFRC 0.98*   < > 0.71* 0.94*  --  0.37  CREATININE  --   --  0.70  --  0.82  --   CKTOTAL  --   --  509*  --   --   --    < > = values in this interval not displayed.    Assessment: 59yo male subtherapeutic on heparin after resumed at lower rate post-op; no gtt issues or signs of bleeding per RN.  Goal of Therapy:  Heparin level 0.3-0.7 units/ml aPTT 66-102 seconds   Plan:  Will increase heparin gtt by 10% to 2300 units/hr and check with am labs.    Wynona Neat, PharmD, BCPS  01/29/2020,11:14 PM

## 2020-01-29 NOTE — Progress Notes (Signed)
PROGRESS NOTE    [name]  GYI:[id number] DOB: [birth year] DOA: 01/19/2020 PCP: Vidal Schwalbe, MD   Chief Complaint  Patient presents with  . Leg Pain    Brief Narrative:  59 year old gentleman prior history of left BKA, recent COVID-19 pneumonia, diabetes mellitus, insulin-dependent presents with severe pain in the left BKA stump antibiotics.  He was admitted for acute left lower extremity ischemia underwent emergent iliofemoral embolectomy.He was started on eliquis for the LL ischemic limb, but later on transitioned to IV heparin as patient agreed to undergo AKA to be scheduled this week. Meanwhile he has persistent leukocytosis, and his left groin incision wound with some drainage. Cultures from the wound sent showing rare wbc seen with some gram positive cocci,. He was empirically started on keflex by vascular surgery. He is currently afebrile T max of 99.7 and wbc count around 14.5.    Assessment & Plan:   Principal Problem:   Ischemic leg Active Problems:   Normocytic anemia   Essential hypertension   DM type 2 causing vascular disease (HCC)   Rhabdomyolysis   Lower limb ischemia   Acute left lower extremity ischemia Duplex shows distal BKA SFA occlusion Echocardiogram does not show any source of thrombus. S/p emergent embolectomy, left iliofemoral on 01/19/2020. 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  Mild hyponatremia: Improving.  131.  Rhabdomyolysis: Resolved.  Elevated liver enzymes: We will check enzymes tomorrow.  Anemia of blood loss from the surgery superimposed on anemia of chronic disease Baseline hemoglobin around 9 currently at 7.6.  Daily monitoring.  Essential hypertension: Was very well controlled yesterday but elevated today due to pain and not getting his antihypertensives today.  Hopefully his blood pressure will improve once he gets his medications.   Type 2 diabetes mellitus CBG (last 3)  Recent Labs     01/29/20 0522 01/29/20 0755 01/29/20 0932  GLUCAP 180* 199* 161*   Hyperglycemic.  Increase Lantus to 55 units and continue SSI and novolog 4 units TIDAC.   Constipation: Will order Dulcolax suppository again.  DVT prophylaxis: Heparin on hold for now. Code Status: full code Family Communication: (none at bedside.  Disposition:   Status is: Inpatient  Remains inpatient appropriate because:Ongoing diagnostic testing needed not appropriate for outpatient work up, Unsafe d/c plan and IV treatments appropriate due to intensity of illness or inability to take PO   Dispo: The patient is from: Home              Anticipated d/c is to: SNF              Anticipated d/c date is: > 3 days              Patient currently is not medically stable to d/c.       Consultants:   Vascular surgery.   Procedures:S/p emergent embolectomy, left iliofemoral on 01/19/2020. S/p left AKA on 01/29/2020.  Antimicrobials: Kefelex   Subjective: Seen and examined after the surgery.  He is alert and oriented.  Denied any complaint.  Objective: Vitals:   01/29/20 0945 01/29/20 1000 01/29/20 1007 01/29/20 1100  BP: (!) 149/88 (!) 147/81 (!) 148/78 (!) 164/97  Pulse: (!) 105 (!) 104 (!) 102 (!) 105  Resp: 18 17 19 20   Temp:      TempSrc:      SpO2: 96% 98% 98% 95%  Weight:      Height:        Intake/Output Summary (Last 24 hours) at  01/29/2020 1109 Last data filed at 01/29/2020 0936 Gross per 24 hour  Intake 1120 ml  Output 2350 ml  Net -1230 ml   Filed Weights   01/19/20 1448 01/24/20 0413 01/24/20 1551  Weight: 118.8 kg 98.6 kg 98.6 kg    Examination:  General exam: Appears calm and comfortable  Respiratory system: Clear to auscultation. Respiratory effort normal. Cardiovascular system: S1 & S2 heard, RRR. No JVD, murmurs, rubs, gallops or clicks. No pedal edema. Gastrointestinal system: Abdomen is nondistended, soft and nontender. No organomegaly or masses felt. Normal bowel  sounds heard. Central nervous system: Alert and oriented. No focal neurological deficits. Extremities: Left AKA Skin: No rashes, lesions or ulcers.  Psychiatry: Judgement and insight appear normal. Mood & affect appropriate.    Data Reviewed: I have personally reviewed following labs and imaging studies  CBC: Recent Labs  Lab 01/26/20 0333 01/27/20 0045 01/28/20 0000 01/29/20 0113  WBC 15.4* 14.5* 13.3* 13.9*  HGB 7.8* 7.6* 7.5* 7.6*  HCT 24.9* 23.7* 23.4* 23.8*  MCV 83.8 83.7 83.3 85.0  PLT 675* 691* 712* 795*    Basic Metabolic Panel: Recent Labs  Lab 01/23/20 0101 01/26/20 0333 01/28/20 0000 01/29/20 0113  NA 132* 131* 128* 131*  K 3.9 4.4 4.5 4.8  CL 98 97* 94* 95*  CO2 24 23 24 26   GLUCOSE 220* 183* 230* 252*  BUN 7* 10 9 9   CREATININE 0.64 0.68 0.70 0.82  CALCIUM 8.6* 8.8* 8.5* 9.2    GFR: Estimated Creatinine Clearance: 105.8 mL/min (by C-G formula based on SCr of 0.82 mg/dL).  Liver Function Tests: Recent Labs  Lab 01/28/20 0000  AST 46*  ALT 96*  ALKPHOS 165*  BILITOT 0.4  PROT 7.7  ALBUMIN 1.9*    CBG: Recent Labs  Lab 01/28/20 1627 01/28/20 2143 01/29/20 0522 01/29/20 0755 01/29/20 0932  GLUCAP 216* 213* 180* 199* 161*     Recent Results (from the past 240 hour(s))  Aerobic Culture (superficial specimen)     Status: None   Collection Time: 01/26/20  7:56 AM   Specimen: Wound  Result Value Ref Range Status   Specimen Description WOUND LEFT GROIN  Final   Special Requests NONE  Final   Gram Stain   Final    RARE WBC PRESENT, PREDOMINANTLY MONONUCLEAR FEW GRAM POSITIVE COCCI RARE GRAM NEGATIVE RODS Performed at [facility], [place] [address] [place], [place] [address]    Culture   Final    FEW SERRATIA MARCESCENS FEW ENTEROCOCCUS FAECALIS    Report Status 01/29/2020 FINAL  Final   Organism ID, Bacteria SERRATIA MARCESCENS  Final   Organism ID, Bacteria ENTEROCOCCUS FAECALIS  Final      Susceptibility   Enterococcus  faecalis - MIC*    AMPICILLIN <=2 SENSITIVE Sensitive     VANCOMYCIN 1 SENSITIVE Sensitive     GENTAMICIN SYNERGY RESISTANT Resistant     * FEW ENTEROCOCCUS FAECALIS   Serratia marcescens - MIC*    CEFAZOLIN >=64 RESISTANT Resistant     CEFEPIME <=0.12 SENSITIVE Sensitive     CEFTAZIDIME <=1 SENSITIVE Sensitive     CEFTRIAXONE <=0.25 SENSITIVE Sensitive     CIPROFLOXACIN <=0.25 SENSITIVE Sensitive     GENTAMICIN <=1 SENSITIVE Sensitive     TRIMETH/SULFA <=20 SENSITIVE Sensitive     * FEW SERRATIA MARCESCENS  MRSA PCR Screening     Status: None   Collection Time: 01/27/20 12:30 PM   Specimen: Nasal Mucosa; Nasopharyngeal  Result Value Ref Range  Status   MRSA by PCR NEGATIVE NEGATIVE Final    Comment:        The GeneXpert MRSA Assay (FDA approved for NASAL specimens only), is one component of a comprehensive MRSA colonization surveillance program. It is not intended to diagnose MRSA infection nor to guide or monitor treatment for MRSA infections. Performed at [facility], [place] [address], [place], [place] [address]          Radiology Studies: DG Abd 2 Views  Result Date: 01/28/2020 CLINICAL DATA:  Constipation EXAM: ABDOMEN - 2 VIEW COMPARISON:  CT abdomen and pelvis January 08, 2020 FINDINGS: Supine and upright images were obtained. There is fairly diffuse stool throughout colon. The colon is not distended due to stool. There is no bowel dilatation or air-fluid level to suggest bowel obstruction. No free air. Lung bases are clear. Scattered foci of arterial vascular calcification noted in the pelvis. IMPRESSION: Fairly diffuse stool throughout colon without colonic distension from stool. No bowel obstruction or free air evident. Lung bases are clear. Electronically Signed   By: Lowella Grip III M.D.   On: 01/28/2020 13:26        Scheduled Meds: . sodium chloride   Intravenous Once  . amitriptyline  25 mg Oral QHS  . amLODipine  10 mg Oral Daily  . aspirin  EC  81 mg Oral Q0600  . bisacodyl  10 mg Rectal Once  . cephALEXin  500 mg Oral TID  . docusate sodium  100 mg Oral Daily  . insulin aspart  0-15 Units Subcutaneous TID WC  . insulin aspart  0-5 Units Subcutaneous QHS  . insulin aspart  4 Units Subcutaneous TID WC  . insulin glargine  48 Units Subcutaneous QHS  . metoprolol succinate  25 mg Oral Daily  . pantoprazole  40 mg Oral Daily  . polyethylene glycol  17 g Oral BID  . rosuvastatin  20 mg Oral Daily   Continuous Infusions: . sodium chloride    . magnesium sulfate bolus IVPB       LOS: 9 days   Darliss Cheney, MD [facility]  To contact the attending provider between 7A-7P or the covering provider during after hours 7P-7A, please log into the web site [email or web address] and access using universal [place] password for that web site. If you do not have the password, please call the hospital operator.  01/29/2020, 11:09 AM

## 2020-01-29 NOTE — Anesthesia Procedure Notes (Signed)
Procedure Name: LMA Insertion Date/Time: 01/29/2020 8:26 AM Performed by: Trinna Post., CRNA Pre-anesthesia Checklist: Patient identified, Emergency Drugs available, Suction available, Patient being monitored and Timeout performed Patient Re-evaluated:Patient Re-evaluated prior to induction Oxygen Delivery Method: Circle system utilized Preoxygenation: Pre-oxygenation with 100% oxygen Induction Type: IV induction Ventilation: Mask ventilation without difficulty LMA: LMA inserted LMA Size: 4.0 Number of attempts: 1 Placement Confirmation: positive ETCO2 and breath sounds checked- equal and bilateral Tube secured with: Tape Dental Injury: Teeth and Oropharynx as per pre-operative assessment

## 2020-01-29 NOTE — Progress Notes (Signed)
ANTICOAGULATION CONSULT NOTE  Pharmacy Consult for Heparin  Indication: ischemic limb, multiple occluded LLE arteries  No Known Allergies  Patient Measurements: Height: 5\' 7"  (170.2 cm) Weight: 98.6 kg (217 lb 6 oz) IBW/kg (Calculated) : 66.1 HEPARIN DW (KG): 87.4   Vital Signs: Temp: 98.1 F (36.7 C) (10/28 0930) Temp Source: Oral (10/28 0529) BP: 164/97 (10/28 1100) Pulse Rate: 105 (10/28 1100)  Labs: Recent Labs    01/27/20 0045 01/27/20 0045 01/27/20 1456 01/27/20 1612 01/28/20 0000 01/28/20 0916 01/29/20 0113  HGB 7.6*   < >  --   --  7.5*  --  7.6*  HCT 23.7*  --   --   --  23.4*  --  23.8*  PLT 691*  --   --   --  712*  --  795*  APTT 61*   < >  --  49* 63* 68*  --   LABPROT  --   --   --   --   --   --  15.1  INR  --   --   --   --   --   --  1.2  HEPARINUNFRC 0.98*   < > 0.81*  --  0.71* 0.94*  --   CREATININE  --   --   --   --  0.70  --  0.82  CKTOTAL  --   --   --   --  509*  --   --    < > = values in this interval not displayed.    Estimated Creatinine Clearance: 105.8 mL/min (by C-G formula based on SCr of 0.82 mg/dL).   Medical History: Past Medical History:  Diagnosis Date  . Diabetes mellitus without complication ([place])   . GERD (gastroesophageal reflux disease)   . Gout   . Hyperlipidemia   . Hypertension   . Insomnia   . Osteoarthritis      Assessment: 59 yo M with hx L BKA admitted for ischemic LLE and thrombus s/p embolectomy 10/18. 10/19 US shows occlusion in the LLE superficial femoral, popliteal and posterior tibial arteries. Apixaban was started 10/20 (last dose of apixaban was 10/24 ~ 10pm) and he was changed to heparin for AKA done today.   Spoke to [employer] PA, and okay to start heparin 6 hours post procedure (completed ~ 10:30am)  -last heparin rate was 2450 units/hr with aPTT= 86 and heparin level= 0.94 -Hg= 7.6 (low/stable)    Goal of Therapy:  Heparin level 0.3-0.7 units/ml aPTT 66-102 seconds Monitor  platelets by anticoagulation protocol: Yes   Plan:  -Resume heparin at 2100 units/hr (lower rate since post OR) at 4:30pm -Heparin level and aPTT in 6 hours and daily wth CBC daily    Hildred Laser, PharmD Clinical Pharmacist **Pharmacist phone directory can now be found on [email or web address] ([email or web address]).  Listed under [place].

## 2020-01-29 NOTE — Transfer of Care (Signed)
Immediate Anesthesia Transfer of Care Note  Patient: Mansfield Dupriest  Procedure(s) Performed: LEFT ABOVE KNEE AMPUTATION (Left Leg Upper)  Patient Location: PACU  Anesthesia Type:General and Regional  Level of Consciousness: drowsy and patient cooperative  Airway & Oxygen Therapy: Patient Spontanous Breathing and Patient connected to face mask oxygen  Post-op Assessment: Report given to RN and Post -op Vital signs reviewed and stable  Post vital signs: Reviewed  Last Vitals:  Vitals Value Taken Time  BP 148/88 01/29/20 0932  Temp 36.7 C 01/29/20 0930  Pulse 107 01/29/20 0935  Resp 20 01/29/20 0935  SpO2 87 % 01/29/20 0935  Vitals shown include unvalidated device data.  Last Pain:  Vitals:   01/29/20 0930  TempSrc:   PainSc: 0-No pain      Patients Stated Pain Goal: 2 (74/12/87 8676)  Complications: No complications documented.

## 2020-01-30 ENCOUNTER — Encounter (HOSPITAL_COMMUNITY): Payer: Self-pay | Admitting: Surgery

## 2020-01-30 DIAGNOSIS — I998 Other disorder of circulatory system: Secondary | ICD-10-CM | POA: Diagnosis not present

## 2020-01-30 LAB — COMPREHENSIVE METABOLIC PANEL
ALT: 62 U/L — ABNORMAL HIGH (ref 0–44)
AST: 24 U/L (ref 15–41)
Albumin: 2.1 g/dL — ABNORMAL LOW (ref 3.5–5.0)
Alkaline Phosphatase: 147 U/L — ABNORMAL HIGH (ref 38–126)
Anion gap: 11 (ref 5–15)
BUN: 10 mg/dL (ref 8–23)
CO2: 25 mmol/L (ref 22–32)
Calcium: 9.4 mg/dL (ref 8.9–10.3)
Chloride: 93 mmol/L — ABNORMAL LOW (ref 98–111)
Creatinine, Ser: 0.7 mg/dL (ref 0.61–1.24)
GFR, Estimated: >60 mL/min (ref >60)
Glucose, Bld: 286 mg/dL — ABNORMAL HIGH (ref 70–99)
Potassium: 5 mmol/L (ref 3.5–5.1)
Sodium: 129 mmol/L — ABNORMAL LOW (ref 135–145)
Total Bilirubin: 0.4 mg/dL (ref 0.3–1.2)
Total Protein: 8.5 g/dL — ABNORMAL HIGH (ref 6.5–8.1)

## 2020-01-30 LAB — CBC
HCT: 24.7 % — ABNORMAL LOW (ref 39.0–52.0)
Hemoglobin: 7.7 g/dL — ABNORMAL LOW (ref 13.0–17.0)
MCH: 25.9 pg — ABNORMAL LOW (ref 26.0–34.0)
MCHC: 31.2 g/dL (ref 30.0–36.0)
MCV: 83.2 fL (ref 80.0–100.0)
Platelets: 796 10*3/uL — ABNORMAL HIGH (ref 150–400)
RBC: 2.97 MIL/uL — ABNORMAL LOW (ref 4.22–5.81)
RDW: 13.9 % (ref 11.5–15.5)
WBC: 16.2 10*3/uL — ABNORMAL HIGH (ref 4.0–10.5)
nRBC: 0 % (ref 0.0–0.2)

## 2020-01-30 LAB — SURGICAL PATHOLOGY

## 2020-01-30 LAB — GLUCOSE, CAPILLARY
Glucose-Capillary: 308 mg/dL — ABNORMAL HIGH (ref 70–99)
Glucose-Capillary: 222 mg/dL — ABNORMAL HIGH (ref 70–99)
Glucose-Capillary: 229 mg/dL — ABNORMAL HIGH (ref 70–99)
Glucose-Capillary: 256 mg/dL — ABNORMAL HIGH (ref 70–99)

## 2020-01-30 LAB — APTT: aPTT: 48 seconds — ABNORMAL HIGH (ref 24–36)

## 2020-01-30 LAB — HEPARIN LEVEL (UNFRACTIONATED): Heparin Unfractionated: 0.49 IU/mL (ref 0.30–0.70)

## 2020-01-30 MED ORDER — INSULIN GLARGINE 100 UNIT/ML ~~LOC~~ SOLN
37.0000 [IU] | Freq: Two times a day (BID) | SUBCUTANEOUS | Status: DC
  Administered 2020-01-30 (×2): 37 [IU] via SUBCUTANEOUS
  Filled 2020-01-30 (×4): qty 0.37

## 2020-01-30 NOTE — Progress Notes (Addendum)
  Progress Note    01/30/2020 7:31 AM 1 Day Post-Op  Subjective:  Some pain but manageable   Vitals:   01/29/20 2330 01/30/20 0437  BP: (!) 154/90 (!) 145/84  Pulse: 92 95  Resp: 18 20  Temp: 98.3 F (36.8 C) 98.2 F (36.8 C)  SpO2: 94% 93%    Physical Exam: Incisions:  L AKA with dressing in place; no breakthrough bleeding   CBC    Component Value Date/Time   WBC 16.2 (H) 01/30/2020 0148   RBC 2.97 (L) 01/30/2020 0148   HGB 7.7 (L) 01/30/2020 0148   HGB 11.5 (L) 08/22/2012 1857   HCT 24.7 (L) 01/30/2020 0148   HCT 34.0 (L) 08/22/2012 1857   PLT 796 (H) 01/30/2020 0148   PLT 345 08/22/2012 1857   MCV 83.2 01/30/2020 0148   MCV 90 08/22/2012 1857   MCH 25.9 (L) 01/30/2020 0148   MCHC 31.2 01/30/2020 0148   RDW 13.9 01/30/2020 0148   RDW 16.7 (H) 08/22/2012 1857   LYMPHSABS 3.4 01/22/2020 0035   LYMPHSABS 2.1 08/05/2011 1228   MONOABS 1.0 01/22/2020 0035   MONOABS 0.9 08/05/2011 1228   EOSABS 0.1 01/22/2020 0035   EOSABS 0.0 08/05/2011 1228   BASOSABS 0.0 01/22/2020 0035   BASOSABS 0.0 08/05/2011 1228    BMET    Component Value Date/Time   NA 129 (L) 01/30/2020 0148   NA 137 10/28/2018 1411   NA 134 (L) 08/22/2012 1857   K 5.0 01/30/2020 0148   K 4.5 08/22/2012 1857   CL 93 (L) 01/30/2020 0148   CL 101 08/22/2012 1857   CO2 25 01/30/2020 0148   CO2 23 08/22/2012 1857   GLUCOSE 286 (H) 01/30/2020 0148   GLUCOSE 379 (H) 08/22/2012 1857   BUN 10 01/30/2020 0148   BUN 12 10/28/2018 1411   BUN 6 (L) 08/22/2012 1857   CREATININE 0.70 01/30/2020 0148   CREATININE 0.84 07/10/2019 0957   CALCIUM 9.4 01/30/2020 0148   CALCIUM 9.1 08/22/2012 1857   GFRNONAA >60 01/30/2020 0148   GFRNONAA 95 07/10/2019 0957   GFRAA 110 07/10/2019 0957    INR    Component Value Date/Time   INR 1.2 01/29/2020 0113     Intake/Output Summary (Last 24 hours) at 01/30/2020 0731 Last data filed at 01/30/2020 0440 Gross per 24 hour  Intake 2577.46 ml  Output 5400 ml   Net -2822.54 ml     Assessment/Plan:  59 y.o. male is s/p left above knee amputation  1 Day Post-Op  - Dressing change tomorrow - Pain medicine on schedule today - continue PT   Dagoberto Ligas, PA-C [facility] [phone] 01/30/2020 7:31 AM  Agree with above.  Acute blood loss anemia.  Restart oral anticoagulation today.  Change dressing tomorrow.  Should be able to go to SNF by Monday if wound healing and pain controlled.  [name], MD [facility] [place] Office: [phone]

## 2020-01-30 NOTE — Progress Notes (Signed)
Physical Therapy Treatment Patient Details Name: [name] MRN: [id number] DOB: 06-26-1960 Today's Date: 01/30/2020    History of Present Illness Pt is a 59 y.o. male with medical history significant for hypertension, hyperlipidemia, insulin-dependent diabetes mellitus, history of left BKA, and recent admission with acute encephalopathy suspected secondary to COVID-19, presented to the ED with pain at his left BKA stump and buttock. Pt found to have acute ischemia left leg. Now s/p left iliofemoral embolectomy on 01/19/20.  Revision of BKA to AKA 10/28.    PT Comments    Pt progressing towards goals, however, limited secondary to fatigue.  Was agreeable to roll for clean up and for supine HEP this session. Pt reports already getting up with OT earlier in the afternoon. Current recommendations appropriate. Will continue to follow acutely.   Follow Up Recommendations  SNF     Equipment Recommendations  None recommended by PT    Recommendations for Other Services       Precautions / Restrictions Precautions Precautions: Fall Precaution Comments: L AKA Restrictions Weight Bearing Restrictions: Yes LLE Weight Bearing: Non weight bearing    Mobility  Bed Mobility Overal bed mobility: Needs Assistance Bed Mobility: Rolling Rolling: Supervision (with bed rails)   Supine to sit: Min assist;HOB elevated Sit to supine: Min assist;HOB elevated   General bed mobility comments: Pt refusing OOB mobility this session as he had gotten up with OT earlier. Was able to roll with use of bed rails for clean up   Transfers Overall transfer level: Needs assistance Equipment used: Rolling walker (2 wheeled)   Sit to Stand: Min assist        Lateral/Scoot Transfers: Min assist    Ambulation/Gait                 Stairs             Wheelchair Mobility    Modified Rankin (Stroke Patients Only)       Balance   Sitting-balance support: Single extremity  supported Sitting balance-Leahy Scale: Fair     Standing balance support: During functional activity;Bilateral upper extremity supported Standing balance-Leahy Scale: Poor                              Cognition Arousal/Alertness: Awake/alert Behavior During Therapy: WFL for tasks assessed/performed Overall Cognitive Status: No family/caregiver present to determine baseline cognitive functioning                                        Exercises General Exercises - Lower Extremity Heel Slides: AROM;Right;10 reps;Supine Straight Leg Raises: AROM;Right;10 reps;Supine Amputee Exercises Hip ABduction/ADduction: AROM;Left;10 reps;Supine Straight Leg Raises: AROM;Left;10 reps;Supine Other Exercises Other Exercises: Bridging using RLE X10.     General Comments        Pertinent Vitals/Pain Pain Assessment: Faces Faces Pain Scale: Hurts whole lot Pain Location: LLE Pain Descriptors / Indicators: Aching Pain Intervention(s): Limited activity within patient's tolerance;Monitored during session;Repositioned    Home Living                      Prior Function            PT Goals (current goals can now be found in the care plan section) Acute Rehab PT Goals Patient Stated Goal: "I'd like to use my leg and walk again" PT Goal  Formulation: With patient Time For Goal Achievement: 02/03/20 Potential to Achieve Goals: Good Progress towards PT goals: Progressing toward goals    Frequency    Min 2X/week      PT Plan Current plan remains appropriate    Co-evaluation              AM-PAC PT "6 Clicks" Mobility   Outcome Measure  Help needed turning from your back to your side while in a flat bed without using bedrails?: None Help needed moving from lying on your back to sitting on the side of a flat bed without using bedrails?: A Little Help needed moving to and from a bed to a chair (including a wheelchair)?: A Little Help needed  standing up from a chair using your arms (e.g., wheelchair or bedside chair)?: A Little Help needed to walk in hospital room?: Total Help needed climbing 3-5 steps with a railing? : Total 6 Click Score: 15    End of Session   Activity Tolerance: Patient limited by fatigue Patient left: in bed;with call bell/phone within reach Nurse Communication: Mobility status;Other (comment) (condom cath falling off) PT Visit Diagnosis: Unsteadiness on feet (R26.81);Other abnormalities of gait and mobility (R26.89);Muscle weakness (generalized) (M62.81);History of falling (Z91.81)     Time: 1281-1886 PT Time Calculation (min) (ACUTE ONLY): 15 min  Charges:  $Therapeutic Exercise: 8-22 mins                     Lou Miner, DPT  Acute Rehabilitation Services  Pager: [phone] Office: [phone]    Rudean Hitt 01/30/2020, 2:50 PM

## 2020-01-30 NOTE — Progress Notes (Signed)
PT Cancellation Note  Patient Details Name: [name] MRN: [id number] DOB: Jul 31, 1960   Cancelled Treatment:     finishing with OT will check back following rest [name], DPT Acute Rehabilitation Services [phone]   [name] 01/30/2020, 12:11 PM

## 2020-01-30 NOTE — Progress Notes (Signed)
ANTICOAGULATION CONSULT NOTE  Pharmacy Consult for Heparin  Indication: ischemic limb, multiple occluded LLE arteries  No Known Allergies  Patient Measurements: Height: 5\' 7"  (170.2 cm) Weight: 98.6 kg (217 lb 6 oz) IBW/kg (Calculated) : 66.1 HEPARIN DW (KG): 87.4   Vital Signs: Temp: 98.2 F (36.8 C) (10/29 0749) Temp Source: Oral (10/29 0749) BP: 137/81 (10/29 0749) Pulse Rate: 91 (10/29 0749)  Labs: Recent Labs    01/28/20 0000 01/28/20 0000 01/28/20 0916 01/29/20 0113 01/29/20 2229 01/30/20 0148  HGB 7.5*   < >  --  7.6*  --  7.7*  HCT 23.4*  --   --  23.8*  --  24.7*  PLT 712*  --   --  795*  --  796*  APTT 63*   < > 68*  --  61* 48*  LABPROT  --   --   --  15.1  --   --   INR  --   --   --  1.2  --   --   HEPARINUNFRC 0.71*   < > 0.94*  --  0.37 0.49  CREATININE 0.70  --   --  0.82  --  0.70  CKTOTAL 509*  --   --   --   --   --    < > = values in this interval not displayed.    Estimated Creatinine Clearance: 108.5 mL/min (by C-G formula based on SCr of 0.7 mg/dL).   Medical History: Past Medical History:  Diagnosis Date   Diabetes mellitus without complication ([place])    GERD (gastroesophageal reflux disease)    Gout    Hyperlipidemia    Hypertension    Insomnia    Osteoarthritis      Assessment: 59 yo M with hx L BKA admitted for ischemic LLE and thrombus s/p embolectomy 10/18. 10/19 US shows occlusion in the LLE superficial femoral, popliteal and posterior tibial arteries. Apixaban was started 10/20 (last dose of apixaban was 10/24 ~ 10pm) and he was changed to heparin for AKA done 10/28 -heparin level at goal  -hg= 7.7 (low/stable)   Goal of Therapy:  Heparin level 0.3-0.7 units/ml aPTT 66-102 seconds Monitor platelets by anticoagulation protocol: Yes   Plan:  -Continue heparin at 2100 units/hr  -Heparin level and daily wth CBC daily -d/c daily aPTT -Will follow oral anticoagulation plans  Hildred Laser, PharmD Clinical  Pharmacist **Pharmacist phone directory can now be found on [email or web address] ([email or web address]).  Listed under [place].

## 2020-01-30 NOTE — Progress Notes (Signed)
Occupational Therapy Treatment Patient Details Name: [name] MRN: [id number] DOB: 07-Oct-1960 Today's Date: 01/30/2020    History of present illness Pt is a 59 y.o. male with medical history significant for hypertension, hyperlipidemia, insulin-dependent diabetes mellitus, history of left BKA, and recent admission with acute encephalopathy suspected secondary to COVID-19, presented to the ED with pain at his left BKA stump and buttock. Pt found to have acute ischemia left leg. Now s/p left iliofemoral embolectomy on 01/19/20.  Revision of BKA to AKA 10/28.   OT comments  Patient reassessed this date.  10/28 revision from L BKA to L AKA.  Patient looks more comfortable despite 8/10 complaint of pain.  He is moving better and is tolerating more activity.  Good participation with OT, SNF is recommended.  Patient with initial mild dizziness with supine to sit, resolved quickly, and no subsequent dizziness noted.  OT to continue to follow in the acute setting.    Follow Up Recommendations  SNF;Supervision/Assistance - 24 hour    Equipment Recommendations       Recommendations for Other Services      Precautions / Restrictions Precautions Precautions: Fall Restrictions Weight Bearing Restrictions: Yes LLE Weight Bearing: Non weight bearing       Mobility Bed Mobility Overal bed mobility: Needs Assistance Bed Mobility: Supine to Sit;Sit to Supine     Supine to sit: Min assist;HOB elevated Sit to supine: Min assist;HOB elevated      Transfers Overall transfer level: Needs assistance Equipment used: Rolling walker (2 wheeled)   Sit to Stand: Min assist        Lateral/Scoot Transfers: Min assist      Balance   Sitting-balance support: Single extremity supported Sitting balance-Leahy Scale: Fair     Standing balance support: During functional activity;Bilateral upper extremity supported Standing balance-Leahy Scale: Poor                                          Cognition Arousal/Alertness: Awake/alert Behavior During Therapy: WFL for tasks assessed/performed Overall Cognitive Status: Within Functional Limits for tasks assessed                                                            Pertinent Vitals/ Pain       Pain Assessment: Faces Faces Pain Scale: Hurts whole lot Pain Location: LLE Pain Descriptors / Indicators: Aching Pain Intervention(s): Limited activity within patient's tolerance;Monitored during session;Repositioned                                                          Frequency  Min 2X/week        Progress Toward Goals  OT Goals(current goals can now be found in the care plan section)  Progress towards OT goals: Progressing toward goals  Acute Rehab OT Goals Patient Stated Goal: I'd like to use my leg and walk again. OT Goal Formulation: With patient Time For Goal Achievement: 02/03/20 Potential to Achieve Goals: Elias-Fela Solis Discharge plan remains appropriate  Co-evaluation                 AM-PAC OT "6 Clicks" Daily Activity     Outcome Measure   Help from another person eating meals?: None Help from another person taking care of personal grooming?: None Help from another person toileting, which includes using toliet, bedpan, or urinal?: A Lot Help from another person bathing (including washing, rinsing, drying)?: A Little Help from another person to put on and taking off regular upper body clothing?: A Little Help from another person to put on and taking off regular lower body clothing?: A Little 6 Click Score: 19    End of Session Equipment Utilized During Treatment: Gait belt;Rolling walker  OT Visit Diagnosis: Other abnormalities of gait and mobility (R26.89);Muscle weakness (generalized) (M62.81);Pain;History of falling (Z91.81) Pain - Right/Left: Left Pain - part of body: Leg   Activity Tolerance Patient tolerated  treatment well   Patient Left with call bell/phone within reach;in bed   Nurse Communication Mobility status        Time: 9276-3943 OT Time Calculation (min): 18 min  Charges: OT General Charges $OT Visit: 1 Visit OT Treatments $Self Care/Home Management : 8-22 mins  01/30/2020  Rich, OTR/L  Acute Rehabilitation Services  Office:  [phone]    Metta Clines 01/30/2020, 12:11 PM

## 2020-01-30 NOTE — Progress Notes (Signed)
PROGRESS NOTE    [name]  YTK:[id number] DOB: 1961/03/15 DOA: 01/19/2020 PCP: Vidal Schwalbe, MD   Chief Complaint  Patient presents with  . Leg Pain    Brief Narrative:  59 year old gentleman prior history of left BKA, recent COVID-19 pneumonia, diabetes mellitus, insulin-dependent presents with severe pain in the left BKA stump antibiotics.  He was admitted for acute left lower extremity ischemia underwent emergent iliofemoral embolectomy.He was started on eliquis for the LL ischemic limb, but later on transitioned to IV heparin as patient agreed to undergo AKA to be scheduled this week. Meanwhile he has persistent leukocytosis, and his left groin incision wound with some drainage. Cultures from the wound sent showing rare wbc seen with some gram positive cocci,. He was empirically started on keflex by vascular surgery. He is currently afebrile T max of 99.7 and wbc count around 14.5.    Assessment & Plan:   Principal Problem:   Ischemic leg Active Problems:   Normocytic anemia   Essential hypertension   DM type 2 causing vascular disease (HCC)   Rhabdomyolysis   Lower limb ischemia   Acute left lower extremity ischemia Duplex shows distal BKA SFA occlusion Echocardiogram does not show any source of thrombus. S/p emergent embolectomy, left iliofemoral on 01/19/2020. 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  Mild hyponatremia: Improving.  Hovering around 130.  Rhabdomyolysis: Resolved.  Elevated liver enzymes: Improving.  Anemia of blood loss from the surgery superimposed on anemia of chronic disease Baseline hemoglobin around 9 currently at 7.7.  Daily monitoring.  Essential hypertension: Controlled.  Continue current regimen which includes amlodipine 10 mg,Toprol-XL 25 mg   Type 2 diabetes mellitus CBG (last 3)  Recent Labs    01/29/20 2019 01/30/20 0617 01/30/20 1131  GLUCAP 327* 308* 222*   Still hyperglycemic, will increase Lantus  to 37 units twice daily and continue SSI and novolog 4 units TIDAC.   Constipation: Resolved.  DVT prophylaxis: Heparin Code Status: full code Family Communication: (none at bedside.  Disposition:   Status is: Inpatient  Remains inpatient appropriate because:Ongoing diagnostic testing needed not appropriate for outpatient work up, Unsafe d/c plan and IV treatments appropriate due to intensity of illness or inability to take PO   Dispo: The patient is from: Home              Anticipated d/c is to: SNF              Anticipated d/c date is: 2-3 days              Patient currently is not medically stable to d/c.       Consultants:   Vascular surgery.   Procedures:S/p emergent embolectomy, left iliofemoral on 01/19/2020. S/p left AKA on 01/29/2020.  Antimicrobials: Kefelex   Subjective: Patient seen and examined.  Doing well.  No pain no complaint.  Objective: Vitals:   01/29/20 2330 01/30/20 0437 01/30/20 0749 01/30/20 1136  BP: (!) 154/90 (!) 145/84 137/81 133/80  Pulse: 92 95 91 92  Resp: 18 20 18 18   Temp: 98.3 F (36.8 C) 98.2 F (36.8 C) 98.2 F (36.8 C) (!) 97.5 F (36.4 C)  TempSrc: Oral Oral Oral Oral  SpO2: 94% 93% 96% 100%  Weight:      Height:        Intake/Output Summary (Last 24 hours) at 01/30/2020 1247 Last data filed at 01/30/2020 1134 Gross per 24 hour  Intake 2446.92 ml  Output 6000 ml  Net -  3553.08 ml   Filed Weights   01/19/20 1448 01/24/20 0413 01/24/20 1551  Weight: 118.8 kg 98.6 kg 98.6 kg    Examination: General exam: Appears calm and comfortable  Respiratory system: Clear to auscultation. Respiratory effort normal. Cardiovascular system: S1 & S2 heard, RRR. No JVD, murmurs, rubs, gallops or clicks. No pedal edema. Gastrointestinal system: Abdomen is nondistended, soft and nontender. No organomegaly or masses felt. Normal bowel sounds heard. Central nervous system: Alert and oriented. No focal neurological  deficits. Extremities: Left AKA Skin: No rashes, lesions or ulcers.  Psychiatry: Judgement and insight appear normal. Mood & affect appropriate.   Data Reviewed: I have personally reviewed following labs and imaging studies  CBC: Recent Labs  Lab 01/26/20 0333 01/27/20 0045 01/28/20 0000 01/29/20 0113 01/30/20 0148  WBC 15.4* 14.5* 13.3* 13.9* 16.2*  HGB 7.8* 7.6* 7.5* 7.6* 7.7*  HCT 24.9* 23.7* 23.4* 23.8* 24.7*  MCV 83.8 83.7 83.3 85.0 83.2  PLT 675* 691* 712* 795* 796*    Basic Metabolic Panel: Recent Labs  Lab 01/26/20 0333 01/28/20 0000 01/29/20 0113 01/30/20 0148  NA 131* 128* 131* 129*  K 4.4 4.5 4.8 5.0  CL 97* 94* 95* 93*  CO2 23 24 26 25   GLUCOSE 183* 230* 252* 286*  BUN 10 9 9 10   CREATININE 0.68 0.70 0.82 0.70  CALCIUM 8.8* 8.5* 9.2 9.4    GFR: Estimated Creatinine Clearance: 108.5 mL/min (by C-G formula based on SCr of 0.7 mg/dL).  Liver Function Tests: Recent Labs  Lab 01/28/20 0000 01/30/20 0148  AST 46* 24  ALT 96* 62*  ALKPHOS 165* 147*  BILITOT 0.4 0.4  PROT 7.7 8.5*  ALBUMIN 1.9* 2.1*    CBG: Recent Labs  Lab 01/29/20 1114 01/29/20 1643 01/29/20 2019 01/30/20 0617 01/30/20 1131  GLUCAP 221* 305* 327* 308* 222*     Recent Results (from the past 240 hour(s))  Aerobic Culture (superficial specimen)     Status: None   Collection Time: 01/26/20  7:56 AM   Specimen: Wound  Result Value Ref Range Status   Specimen Description WOUND LEFT GROIN  Final   Special Requests NONE  Final   Gram Stain   Final    RARE WBC PRESENT, PREDOMINANTLY MONONUCLEAR FEW GRAM POSITIVE COCCI RARE GRAM NEGATIVE RODS Performed at [facility], [place] [address], [place], [place] [address]    Culture   Final    FEW SERRATIA MARCESCENS FEW ENTEROCOCCUS FAECALIS    Report Status 01/29/2020 FINAL  Final   Organism ID, Bacteria SERRATIA MARCESCENS  Final   Organism ID, Bacteria ENTEROCOCCUS FAECALIS  Final      Susceptibility   Enterococcus  faecalis - MIC*    AMPICILLIN <=2 SENSITIVE Sensitive     VANCOMYCIN 1 SENSITIVE Sensitive     GENTAMICIN SYNERGY RESISTANT Resistant     * FEW ENTEROCOCCUS FAECALIS   Serratia marcescens - MIC*    CEFAZOLIN >=64 RESISTANT Resistant     CEFEPIME <=0.12 SENSITIVE Sensitive     CEFTAZIDIME <=1 SENSITIVE Sensitive     CEFTRIAXONE <=0.25 SENSITIVE Sensitive     CIPROFLOXACIN <=0.25 SENSITIVE Sensitive     GENTAMICIN <=1 SENSITIVE Sensitive     TRIMETH/SULFA <=20 SENSITIVE Sensitive     * FEW SERRATIA MARCESCENS  MRSA PCR Screening     Status: None   Collection Time: 01/27/20 12:30 PM   Specimen: Nasal Mucosa; Nasopharyngeal  Result Value Ref Range Status   MRSA by PCR NEGATIVE NEGATIVE Final  Comment:        The GeneXpert MRSA Assay (FDA approved for NASAL specimens only), is one component of a comprehensive MRSA colonization surveillance program. It is not intended to diagnose MRSA infection nor to guide or monitor treatment for MRSA infections. Performed at [facility], [place] [address], [place], [place] [address]          Radiology Studies: No results found.      Scheduled Meds: . amitriptyline  25 mg Oral QHS  . amLODipine  10 mg Oral Daily  . aspirin EC  81 mg Oral Q0600  . cephALEXin  500 mg Oral TID  . docusate sodium  100 mg Oral Daily  . insulin aspart  0-15 Units Subcutaneous TID WC  . insulin aspart  0-5 Units Subcutaneous QHS  . insulin aspart  4 Units Subcutaneous TID WC  . insulin glargine  37 Units Subcutaneous BID  . metoprolol succinate  25 mg Oral Daily  . pantoprazole  40 mg Oral Daily  . polyethylene glycol  17 g Oral BID  . rosuvastatin  20 mg Oral Daily   Continuous Infusions: . sodium chloride    . heparin 2,300 Units/hr (01/30/20 0600)  . magnesium sulfate bolus IVPB       LOS: 10 days   Darliss Cheney, MD [facility]  To contact the attending provider between 7A-7P or the covering provider during after hours 7P-7A,  please log into the web site [email or web address] and access using universal [place] password for that web site. If you do not have the password, please call the hospital operator.  01/30/2020, 12:47 PM

## 2020-01-30 NOTE — Progress Notes (Signed)
Mobility Specialist - Progress Note   01/30/20 1507  Mobility  Activity Transferred:  Bed to chair  Level of Assistance Moderate assist, patient does 50-74%  Assistive Device Front wheel walker  Mobility Response Tolerated well  Mobility performed by Mobility specialist  $Mobility charge 1 Mobility   Pt required bed raised in order to stand. He was able to pivot w/o assistance. Pt in chair w/ call bell within reach. Pt asx throughout.   Pricilla Handler Mobility Specialist Mobility Specialist Phone: [phone]

## 2020-01-31 DIAGNOSIS — I998 Other disorder of circulatory system: Secondary | ICD-10-CM | POA: Diagnosis not present

## 2020-01-31 LAB — BASIC METABOLIC PANEL
Anion gap: 13 (ref 5–15)
BUN: 13 mg/dL (ref 8–23)
CO2: 24 mmol/L (ref 22–32)
Calcium: 9.5 mg/dL (ref 8.9–10.3)
Chloride: 94 mmol/L — ABNORMAL LOW (ref 98–111)
Creatinine, Ser: 0.66 mg/dL (ref 0.61–1.24)
GFR, Estimated: >60 mL/min (ref >60)
Glucose, Bld: 204 mg/dL — ABNORMAL HIGH (ref 70–99)
Potassium: 4.5 mmol/L (ref 3.5–5.1)
Sodium: 131 mmol/L — ABNORMAL LOW (ref 135–145)

## 2020-01-31 LAB — GLUCOSE, CAPILLARY
Glucose-Capillary: 220 mg/dL — ABNORMAL HIGH (ref 70–99)
Glucose-Capillary: 262 mg/dL — ABNORMAL HIGH (ref 70–99)
Glucose-Capillary: 218 mg/dL — ABNORMAL HIGH (ref 70–99)
Glucose-Capillary: 251 mg/dL — ABNORMAL HIGH (ref 70–99)

## 2020-01-31 LAB — CBC
HCT: 25.7 % — ABNORMAL LOW (ref 39.0–52.0)
Hemoglobin: 8.2 g/dL — ABNORMAL LOW (ref 13.0–17.0)
MCH: 26.6 pg (ref 26.0–34.0)
MCHC: 31.9 g/dL (ref 30.0–36.0)
MCV: 83.4 fL (ref 80.0–100.0)
Platelets: 997 10*3/uL (ref 150–400)
RBC: 3.08 MIL/uL — ABNORMAL LOW (ref 4.22–5.81)
RDW: 14.1 % (ref 11.5–15.5)
WBC: 15.7 10*3/uL — ABNORMAL HIGH (ref 4.0–10.5)
nRBC: 0.2 % (ref 0.0–0.2)

## 2020-01-31 LAB — HEPARIN LEVEL (UNFRACTIONATED): Heparin Unfractionated: 0.32 IU/mL (ref 0.30–0.70)

## 2020-01-31 MED ORDER — AMOXICILLIN 500 MG PO CAPS
500.0000 mg | ORAL_CAPSULE | Freq: Three times a day (TID) | ORAL | Status: DC
  Filled 2020-01-31 (×2): qty 1

## 2020-01-31 MED ORDER — SULFAMETHOXAZOLE-TRIMETHOPRIM 800-160 MG PO TABS: 1.0000 | ORAL_TABLET | Freq: Two times a day (BID) | ORAL | Status: DC

## 2020-01-31 MED ORDER — SULFAMETHOXAZOLE-TRIMETHOPRIM 800-160 MG PO TABS
1.0000 | ORAL_TABLET | Freq: Two times a day (BID) | ORAL | Status: DC
  Administered 2020-01-31 – 2020-02-01 (×3): 1 via ORAL
  Filled 2020-01-31 (×3): qty 1

## 2020-01-31 MED ORDER — AMOXICILLIN 500 MG PO CAPS
500.0000 mg | ORAL_CAPSULE | Freq: Three times a day (TID) | ORAL | Status: DC
  Administered 2020-01-31 – 2020-02-01 (×5): 500 mg via ORAL
  Filled 2020-01-31 (×6): qty 1

## 2020-01-31 MED ORDER — INSULIN GLARGINE 100 UNIT/ML ~~LOC~~ SOLN
45.0000 [IU] | Freq: Two times a day (BID) | SUBCUTANEOUS | Status: DC
  Administered 2020-01-31 (×2): 45 [IU] via SUBCUTANEOUS
  Filled 2020-01-31 (×4): qty 0.45

## 2020-01-31 NOTE — Progress Notes (Signed)
PROGRESS NOTE    [name]  KDT:[id number] DOB: Jan 13, 1961 DOA: 01/19/2020 PCP: Vidal Schwalbe, MD   Chief Complaint  Patient presents with  . Leg Pain    Brief Narrative:  59 year old gentleman prior history of left BKA, recent COVID-19 pneumonia, diabetes mellitus, insulin-dependent presents with severe pain in the left BKA stump antibiotics.  He was admitted for acute left lower extremity ischemia underwent emergent iliofemoral embolectomy.He was started on eliquis for the LL ischemic limb, but later on transitioned to IV heparin as patient agreed to undergo AKA to be scheduled this week. Meanwhile he has persistent leukocytosis, and his left groin incision wound with some drainage. Cultures from the wound sent showing rare wbc seen with some gram positive cocci,. He was empirically started on keflex by vascular surgery. He is currently afebrile T max of 99.7 and wbc count around 14.5.    Assessment & Plan:   Principal Problem:   Ischemic leg Active Problems:   Normocytic anemia   Essential hypertension   DM type 2 causing vascular disease (HCC)   Rhabdomyolysis   Lower limb ischemia   Acute left lower extremity ischemia Duplex shows distal BKA SFA occlusion Echocardiogram does not show any source of thrombus. S/p emergent embolectomy, left iliofemoral on 01/19/2020. 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  Seen by PT OT.  Recommendation for SNF.  TOC on board for placement.  Left groin wound: Culture is growing Serratia marcescens and Enterococcus faecalis.  We will switch antibiotics to Bactrim DS for 7 days.  Mild hyponatremia: Improving.  Hovering around 130.  Rhabdomyolysis: Resolved.  Elevated liver enzymes: Improving.  Anemia of blood loss from the surgery superimposed on anemia of chronic disease Baseline hemoglobin around 9 currently at 8.2.  Daily monitoring.  Essential hypertension: Controlled.  Continue current regimen which  includes amlodipine 10 mg,Toprol-XL 25 mg   Type 2 diabetes mellitus CBG (last 3)  Recent Labs    01/30/20 1649 01/30/20 2143 01/31/20 0621  GLUCAP 229* 256* 220*   Still hyperglycemic, will increase Lantus to 45 units twice daily and continue SSI and novolog 4 units TIDAC.   Constipation: Resolved.  DVT prophylaxis: Heparin Code Status: full code Family Communication: (none at bedside.  Disposition:   Status is: Inpatient  Remains inpatient appropriate because:Ongoing diagnostic testing needed not appropriate for outpatient work up, Unsafe d/c plan and IV treatments appropriate due to intensity of illness or inability to take PO   Dispo: The patient is from: Home              Anticipated d/c is to: SNF              Anticipated d/c date is: 2-3 days              Patient currently is medically stable to d/c.    Consultants:   Vascular surgery.   Procedures:S/p emergent embolectomy, left iliofemoral on 01/19/2020. S/p left AKA on 01/29/2020.    Subjective: Seen and examined.  Some pain in the left stump but controlled.  No other complaint.  Objective: Vitals:   01/30/20 1136 01/30/20 1651 01/31/20 0059 01/31/20 0810  BP: 133/80 132/77 109/72 109/68  Pulse: 92 86 99 99  Resp: 18 18 19 18   Temp: (!) 97.5 F (36.4 C)  99.6 F (37.6 C) 98.9 F (37.2 C)  TempSrc: Oral Oral Oral Oral  SpO2: 100% 95% 95% 100%  Weight:      Height:  Intake/Output Summary (Last 24 hours) at 01/31/2020 1136 Last data filed at 01/31/2020 0844 Gross per 24 hour  Intake 480 ml  Output 2600 ml  Net -2120 ml   Filed Weights   01/19/20 1448 01/24/20 0413 01/24/20 1551  Weight: 118.8 kg 98.6 kg 98.6 kg    Examination:  General exam: Appears calm and comfortable  Respiratory system: Clear to auscultation. Respiratory effort normal. Cardiovascular system: S1 & S2 heard, RRR. No JVD, murmurs, rubs, gallops or clicks. No pedal edema. Gastrointestinal system: Abdomen is  nondistended, soft and nontender. No organomegaly or masses felt. Normal bowel sounds heard. Central nervous system: Alert and oriented. No focal neurological deficits. Extremities: Left AKA Skin: No rashes, lesions or ulcers.  Psychiatry: Judgement and insight appear normal. Mood & affect appropriate.    Data Reviewed: I have personally reviewed following labs and imaging studies  CBC: Recent Labs  Lab 01/27/20 0045 01/28/20 0000 01/29/20 0113 01/30/20 0148 01/31/20 0231  WBC 14.5* 13.3* 13.9* 16.2* 15.7*  HGB 7.6* 7.5* 7.6* 7.7* 8.2*  HCT 23.7* 23.4* 23.8* 24.7* 25.7*  MCV 83.7 83.3 85.0 83.2 83.4  PLT 691* 712* 795* 796* 997*    Basic Metabolic Panel: Recent Labs  Lab 01/26/20 0333 01/28/20 0000 01/29/20 0113 01/30/20 0148 01/31/20 0231  NA 131* 128* 131* 129* 131*  K 4.4 4.5 4.8 5.0 4.5  CL 97* 94* 95* 93* 94*  CO2 23 24 26 25 24   GLUCOSE 183* 230* 252* 286* 204*  BUN 10 9 9 10 13   CREATININE 0.68 0.70 0.82 0.70 0.66  CALCIUM 8.8* 8.5* 9.2 9.4 9.5    GFR: Estimated Creatinine Clearance: 108.5 mL/min (by C-G formula based on SCr of 0.66 mg/dL).  Liver Function Tests: Recent Labs  Lab 01/28/20 0000 01/30/20 0148  AST 46* 24  ALT 96* 62*  ALKPHOS 165* 147*  BILITOT 0.4 0.4  PROT 7.7 8.5*  ALBUMIN 1.9* 2.1*    CBG: Recent Labs  Lab 01/30/20 0617 01/30/20 1131 01/30/20 1649 01/30/20 2143 01/31/20 0621  GLUCAP 308* 222* 229* 256* 220*     Recent Results (from the past 240 hour(s))  Aerobic Culture (superficial specimen)     Status: None   Collection Time: 01/26/20  7:56 AM   Specimen: Wound  Result Value Ref Range Status   Specimen Description WOUND LEFT GROIN  Final   Special Requests NONE  Final   Gram Stain   Final    RARE WBC PRESENT, PREDOMINANTLY MONONUCLEAR FEW GRAM POSITIVE COCCI RARE GRAM NEGATIVE RODS Performed at [facility], [place] [address] [place], [place] [address]    Culture   Final    FEW SERRATIA  MARCESCENS FEW ENTEROCOCCUS FAECALIS    Report Status 01/29/2020 FINAL  Final   Organism ID, Bacteria SERRATIA MARCESCENS  Final   Organism ID, Bacteria ENTEROCOCCUS FAECALIS  Final      Susceptibility   Enterococcus faecalis - MIC*    AMPICILLIN <=2 SENSITIVE Sensitive     VANCOMYCIN 1 SENSITIVE Sensitive     GENTAMICIN SYNERGY RESISTANT Resistant     * FEW ENTEROCOCCUS FAECALIS   Serratia marcescens - MIC*    CEFAZOLIN >=64 RESISTANT Resistant     CEFEPIME <=0.12 SENSITIVE Sensitive     CEFTAZIDIME <=1 SENSITIVE Sensitive     CEFTRIAXONE <=0.25 SENSITIVE Sensitive     CIPROFLOXACIN <=0.25 SENSITIVE Sensitive     GENTAMICIN <=1 SENSITIVE Sensitive     TRIMETH/SULFA <=20 SENSITIVE Sensitive     *  FEW SERRATIA MARCESCENS  MRSA PCR Screening     Status: None   Collection Time: 01/27/20 12:30 PM   Specimen: Nasal Mucosa; Nasopharyngeal  Result Value Ref Range Status   MRSA by PCR NEGATIVE NEGATIVE Final    Comment:        The GeneXpert MRSA Assay (FDA approved for NASAL specimens only), is one component of a comprehensive MRSA colonization surveillance program. It is not intended to diagnose MRSA infection nor to guide or monitor treatment for MRSA infections. Performed at [facility], [place] [address] [place], [place] [address]     Radiology Studies: No results found.  Scheduled Meds: . amitriptyline  25 mg Oral QHS  . amLODipine  10 mg Oral Daily  . amoxicillin  500 mg Oral Q8H  . aspirin EC  81 mg Oral Q0600  . docusate sodium  100 mg Oral Daily  . insulin aspart  0-15 Units Subcutaneous TID WC  . insulin aspart  0-5 Units Subcutaneous QHS  . insulin aspart  4 Units Subcutaneous TID WC  . insulin glargine  45 Units Subcutaneous BID  . metoprolol succinate  25 mg Oral Daily  . pantoprazole  40 mg Oral Daily  . polyethylene glycol  17 g Oral BID  . rosuvastatin  20 mg Oral Daily  . sulfamethoxazole-trimethoprim  1 tablet Oral Q12H   Continuous  Infusions: . sodium chloride    . heparin 2,300 Units/hr (01/31/20 0207)  . magnesium sulfate bolus IVPB       LOS: 11 days   Darliss Cheney, MD [facility]  To contact the attending provider between 7A-7P or the covering provider during after hours 7P-7A, please log into the web site [email or web address] and access using universal [place] password for that web site. If you do not have the password, please call the hospital operator.  01/31/2020, 11:36 AM

## 2020-01-31 NOTE — Progress Notes (Signed)
CSW updated by provider that pt will be ready for D/C on 10/31.  Per chart review, pt has accepted a bed offer from [facility] although as of 10/26 the pt/CSW had not hurt back from [facility] which is closer to pt's home.  CSW will continue to follow for D/C needs.  Alphonse Guild. Zaryia Markel  MSW, LCSW, LCAS, CCS Transitions of Care Clinical Social Worker Care Coordination Department Ph: [phone]

## 2020-01-31 NOTE — Progress Notes (Signed)
   VASCULAR SURGERY ASSESSMENT & PLAN:   POD 2 LEFT AKA: His AKA is healing nicely.  He has some slight separation in his left groin and will need continued meticulous wound care.  He is on Eliquis.  Begin daily dressing changes to left AKA.  SUBJECTIVE:   No complaints  PHYSICAL EXAM:   Vitals:   01/30/20 1136 01/30/20 1651 01/31/20 0059 01/31/20 0810  BP: 133/80 132/77 109/72 109/68  Pulse: 92 86 99 99  Resp: 18 18 19 18   Temp: (!) 97.5 F (36.4 C)  99.6 F (37.6 C) 98.9 F (37.2 C)  TempSrc: Oral Oral Oral Oral  SpO2: 100% 95% 95% 100%  Weight:      Height:       His left above-the-knee amputation site looks fine. His left groin wound has some small separation.  No significant drainage.  LABS:   Lab Results  Component Value Date   WBC 15.7 (H) 01/31/2020   HGB 8.2 (L) 01/31/2020   HCT 25.7 (L) 01/31/2020   MCV 83.4 01/31/2020   PLT 997 ([place]) 01/31/2020   Lab Results  Component Value Date   CREATININE 0.66 01/31/2020   Lab Results  Component Value Date   INR 1.2 01/29/2020   CBG (last 3)  Recent Labs    01/30/20 1649 01/30/20 2143 01/31/20 0621  GLUCAP 229* 256* 220*    PROBLEM LIST:    Principal Problem:   Ischemic leg Active Problems:   Normocytic anemia   Essential hypertension   DM type 2 causing vascular disease (HCC)   Rhabdomyolysis   Lower limb ischemia   CURRENT MEDS:   . amitriptyline  25 mg Oral QHS  . amLODipine  10 mg Oral Daily  . amoxicillin  500 mg Oral Q8H  . aspirin EC  81 mg Oral Q0600  . docusate sodium  100 mg Oral Daily  . insulin aspart  0-15 Units Subcutaneous TID WC  . insulin aspart  0-5 Units Subcutaneous QHS  . insulin aspart  4 Units Subcutaneous TID WC  . insulin glargine  45 Units Subcutaneous BID  . metoprolol succinate  25 mg Oral Daily  . pantoprazole  40 mg Oral Daily  . polyethylene glycol  17 g Oral BID  . rosuvastatin  20 mg Oral Daily  . sulfamethoxazole-trimethoprim  1 tablet Oral Q12H     Deitra Mayo Office: [phone] 01/31/2020

## 2020-01-31 NOTE — Progress Notes (Signed)
ANTICOAGULATION CONSULT NOTE  Pharmacy Consult for Heparin  Indication: ischemic limb, multiple occluded LLE arteries  No Known Allergies  Patient Measurements: Height: 5\' 7"  (170.2 cm) Weight: 98.6 kg (217 lb 6 oz) IBW/kg (Calculated) : 66.1 HEPARIN DW (KG): 87.4   Vital Signs: Temp: 99.6 F (37.6 C) (10/30 0059) Temp Source: Oral (10/30 0059) BP: 109/72 (10/30 0059) Pulse Rate: 99 (10/30 0059)  Labs: Recent Labs    01/28/20 0916 01/28/20 0916 01/29/20 0113 01/29/20 0113 01/29/20 2229 01/30/20 0148 01/31/20 0231  HGB  --   --  7.6*   < >  --  7.7* 8.2*  HCT  --   --  23.8*  --   --  24.7* 25.7*  PLT  --   --  795*  --   --  796* 997*  APTT 68*  --   --   --  61* 48*  --   LABPROT  --   --  15.1  --   --   --   --   INR  --   --  1.2  --   --   --   --   HEPARINUNFRC 0.94*   < >  --   --  0.37 0.49 0.32  CREATININE  --   --  0.82  --   --  0.70 0.66   < > = values in this interval not displayed.    Estimated Creatinine Clearance: 108.5 mL/min (by C-G formula based on SCr of 0.66 mg/dL).   Medical History: Past Medical History:  Diagnosis Date  . Diabetes mellitus without complication ([place])   . GERD (gastroesophageal reflux disease)   . Gout   . Hyperlipidemia   . Hypertension   . Insomnia   . Osteoarthritis      Assessment: 59 yo M with hx L BKA admitted for ischemic LLE and thrombus s/p embolectomy 10/18. 10/19 US shows occlusion in the LLE superficial femoral, popliteal and posterior tibial arteries. Apixaban was started 10/20 (last dose of apixaban was 10/24 ~ 10pm) and he was changed to heparin for AKA 10/28  Heparin level this AM = 0.32, therapeutic. Hgb 8.2, platelets chronically elevated. No signs of bleeding charted. Will continue current rate.   Goal of Therapy:  Heparin level 0.3-0.7 units/ml aPTT 66-102 seconds Monitor platelets by anticoagulation protocol: Yes   Plan:  -Continue heparin at 2,300 units/hr  -Monitor daily heparin level  and CBC  -Follow oral anticoagulation plans  Mercy Riding, PharmD PGY1 Acute Care Pharmacy Resident Please refer to [facility] for unit-specific pharmacist

## 2020-01-31 NOTE — Progress Notes (Signed)
Mobility Specialist: Progress Note   01/31/20 1312  Mobility  Activity Transferred:  Bed to chair  Level of Assistance Independent  Assistive Device None  Mobility Response Tolerated well  Mobility performed by Mobility specialist  Bed Position Chair  $Mobility charge 1 Mobility   Pre-Mobility: 128/69 BP  Pt c/o 9/10 pain during transfer. Pt was min assist to EOB and independent during scooting transfer into the chair. Pt has call bell by his side.   [facility] Sharen Youngren Mobility Specialist

## 2020-02-01 DIAGNOSIS — L8990 Pressure ulcer of unspecified site, unspecified stage: Secondary | ICD-10-CM | POA: Insufficient documentation

## 2020-02-01 DIAGNOSIS — I998 Other disorder of circulatory system: Secondary | ICD-10-CM | POA: Diagnosis not present

## 2020-02-01 LAB — BASIC METABOLIC PANEL
Anion gap: 10 (ref 5–15)
BUN: 14 mg/dL (ref 8–23)
CO2: 23 mmol/L (ref 22–32)
Calcium: 9.4 mg/dL (ref 8.9–10.3)
Chloride: 98 mmol/L (ref 98–111)
Creatinine, Ser: 0.74 mg/dL (ref 0.61–1.24)
GFR, Estimated: >60 mL/min (ref >60)
Glucose, Bld: 228 mg/dL — ABNORMAL HIGH (ref 70–99)
Potassium: 4.6 mmol/L (ref 3.5–5.1)
Sodium: 131 mmol/L — ABNORMAL LOW (ref 135–145)

## 2020-02-01 LAB — CBC
HCT: 25.6 % — ABNORMAL LOW (ref 39.0–52.0)
Hemoglobin: 8 g/dL — ABNORMAL LOW (ref 13.0–17.0)
MCH: 25.8 pg — ABNORMAL LOW (ref 26.0–34.0)
MCHC: 31.3 g/dL (ref 30.0–36.0)
MCV: 82.6 fL (ref 80.0–100.0)
Platelets: 933 10*3/uL (ref 150–400)
RBC: 3.1 MIL/uL — ABNORMAL LOW (ref 4.22–5.81)
RDW: 14.1 % (ref 11.5–15.5)
WBC: 15.1 10*3/uL — ABNORMAL HIGH (ref 4.0–10.5)
nRBC: 0 % (ref 0.0–0.2)

## 2020-02-01 LAB — GLUCOSE, CAPILLARY
Glucose-Capillary: 170 mg/dL — ABNORMAL HIGH (ref 70–99)
Glucose-Capillary: 223 mg/dL — ABNORMAL HIGH (ref 70–99)

## 2020-02-01 LAB — HEPARIN LEVEL (UNFRACTIONATED): Heparin Unfractionated: 0.57 IU/mL (ref 0.30–0.70)

## 2020-02-01 MED ORDER — APIXABAN 5 MG PO TABS: 5.0000 mg | ORAL_TABLET | Freq: Two times a day (BID) | ORAL | 0 refills | Status: AC

## 2020-02-01 MED ORDER — ROSUVASTATIN CALCIUM 10 MG PO TABS: 20.0000 mg | ORAL_TABLET | Freq: Every day | ORAL | 3 refills | Status: DC

## 2020-02-01 MED ORDER — ASPIRIN 81 MG PO TBEC: 81.0000 mg | DELAYED_RELEASE_TABLET | Freq: Every day | ORAL | 0 refills | Status: AC

## 2020-02-01 MED ORDER — SULFAMETHOXAZOLE-TRIMETHOPRIM 800-160 MG PO TABS: 1.0000 | ORAL_TABLET | Freq: Two times a day (BID) | ORAL | 0 refills | Status: AC

## 2020-02-01 MED ORDER — AMOXICILLIN 500 MG PO CAPS: 500.0000 mg | ORAL_CAPSULE | Freq: Three times a day (TID) | ORAL | 0 refills | Status: AC

## 2020-02-01 MED ORDER — INSULIN GLARGINE 100 UNIT/ML ~~LOC~~ SOLN: 50.0000 [IU] | Freq: Two times a day (BID) | SUBCUTANEOUS | 0 refills | Status: AC

## 2020-02-01 MED ORDER — INSULIN GLARGINE 100 UNIT/ML ~~LOC~~ SOLN
50.0000 [IU] | Freq: Two times a day (BID) | SUBCUTANEOUS | Status: DC
  Administered 2020-02-01: 50 [IU] via SUBCUTANEOUS
  Filled 2020-02-01 (×2): qty 0.5

## 2020-02-01 NOTE — Care Plan (Signed)
Patient has follow-up appointment arranged with [facility] on February 12, 2020 at 3:15pm.

## 2020-02-01 NOTE — Plan of Care (Signed)
  Problem: Nutrition: Goal: Adequate nutrition will be maintained Outcome: Progressing   

## 2020-02-01 NOTE — Progress Notes (Addendum)
ANTICOAGULATION CONSULT NOTE  Pharmacy Consult for Heparin  Indication: ischemic limb, multiple occluded LLE arteries  No Known Allergies  Patient Measurements: Height: 5\' 7"  (170.2 cm) Weight: 98.6 kg (217 lb 6 oz) IBW/kg (Calculated) : 66.1 HEPARIN DW (KG): 87.4   Vital Signs: Temp: 98.6 F (37 C) (10/31 0520) Temp Source: Oral (10/31 0520) BP: 118/79 (10/31 0520) Pulse Rate: 85 (10/31 0520)  Labs: Recent Labs    01/29/20 2229 01/29/20 2229 01/30/20 0148 01/30/20 0148 01/31/20 0231 02/01/20 0231  HGB  --   --  7.7*   < > 8.2* 8.0*  HCT  --   --  24.7*  --  25.7* 25.6*  PLT  --   --  796*  --  997* 933*  APTT 61*  --  48*  --   --   --   HEPARINUNFRC 0.37   < > 0.49  --  0.32 0.57  CREATININE  --   --  0.70  --  0.66 0.74   < > = values in this interval not displayed.    Estimated Creatinine Clearance: 108.5 mL/min (by C-G formula based on SCr of 0.74 mg/dL).   Medical History: Past Medical History:  Diagnosis Date  . Diabetes mellitus without complication ([place])   . GERD (gastroesophageal reflux disease)   . Gout   . Hyperlipidemia   . Hypertension   . Insomnia   . Osteoarthritis     Assessment: 59 yo M with hx L BKA admitted for ischemic LLE and thrombus s/p embolectomy 10/18. 10/19 US shows occlusion in the LLE superficial femoral, popliteal and posterior tibial arteries. Apixaban was started 10/20 (last dose of apixaban was 10/24 ~ 10pm) and was changed to heparin for AKA 10/28  Heparin level this AM = 0.57, therapeutic. Hgb 8.0, platelets chronically elevated. No signs of bleeding charted. Will continue current rate.   Goal of Therapy:  Heparin level 0.3-0.7 units/ml Monitor platelets by anticoagulation protocol: Yes   Plan:  -Continue heparin at 2,300 units/hr  -Monitor daily heparin level and CBC  -Follow oral anticoagulation plans  Mercy Riding, PharmD PGY1 Acute Care Pharmacy Resident Please refer to [facility] for unit-specific  pharmacist

## 2020-02-01 NOTE — TOC Progression Note (Signed)
Transition of Care [facility]) - Progression Note    Patient Details  Name: [name] MRN: [id number] Date of Birth: 14-Dec-1960  [facility] CM/SW Contact  Andria Frames, [place] Work Phone Number: 02/01/2020, 10:51 AM  Clinical Narrative:     MSW Intern attempted to contact Krista Blue w/ [facility] @[phone]  but VM was full, message sent. MSW Intern spoke with [facility] nurse Legrand Como @[phone] ; advised patient will be in Room #126A, call to report @[phone]  w/ Legrand Como.     Expected Discharge Plan: [place] Barriers to Discharge: Continued Medical Work up, SNF Pending bed offer  Expected Discharge Plan and Services Expected Discharge Plan: [place] In-house Referral: Clinical Social Work     Living arrangements for the past 2 months: Single Family Home Expected Discharge Date: 02/01/20                                     Social Determinants of Health (SDOH) Interventions    Readmission Risk Interventions No flowsheet data found.

## 2020-02-01 NOTE — Progress Notes (Signed)
Mobility Specialist: Progress Note   02/01/20 1004  Mobility  Activity Dangled on edge of bed  Level of Assistance Independent  Assistive Device None  Mobility Response Tolerated poorly  Mobility performed by Mobility specialist  Bed Position Semi-fowlers  $Mobility charge 1 Mobility   Pre-Mobility:   Lying: 95 HR, 119/73 BP, 95% SpO2  Sitting: 111/70 BP  Pt c/o of dizziness after sitting on EOB. I took pt's BP again which dropped slightly as seen in values above, RN notified. Pt sat on EOB for a few minutes and said his dizziness hadn't improved and didn't feel that he could stand at the RW. Positioned pt back in bed with help of RN. Will f/u as time allows.   [facility] Dannielle Baskins Mobility Specialist

## 2020-02-01 NOTE — Progress Notes (Signed)
Report called to RN at [facility]. All questions answered. Pt awaiting transport to facility.  Arletta Bale, RN

## 2020-02-01 NOTE — Progress Notes (Signed)
Redressed left groin site. Area moist with separation, some tan/whitish slough. No odor. Patient denies pain in this area.  However patient c/o left stump hurting when barely touching to dress site. Advised patient to let rounding MD know of issue.   Will continue to monitor

## 2020-02-01 NOTE — Discharge Summary (Signed)
Physician Discharge Summary  [name] UKG:[id number] DOB: 07/18/1960 DOA: 01/19/2020  PCP: Vidal Schwalbe, MD  Admit date: 01/19/2020 Discharge date: 02/01/2020  Admitted From: Home Disposition:   SNF  Recommendations for Outpatient Follow-up:  1. Follow up with PCP in 1-2 weeks 2. Follow-up with vascular surgery in 2 weeks 3. Please obtain BMP/CBC in one week 4. Please follow up with your PCP on the following pending results: Unresulted Labs (From admission, onward)          Start     Ordered   01/27/20 0500  CBC  Daily,   R     Question:  Specimen collection method  Answer:  Lab=Lab collect   01/26/20 0748           Home Health: None Equipment/Devices: None  Discharge Condition: Stable CODE STATUS: Full code Diet recommendation: Cardiac  Subjective: Seen and examined.  Complains of mild pain at the left stump.  No new complaint.  Brief/Interim Summary: 59 year old gentleman prior history of left BKA, recent COVID-19 pneumonia, diabetes mellitus, insulin-dependent initially presented at [facility] with severe pain in the left BKA stump. He was admitted for acute left lower extremity ischemia at [facility] under hospital service and vascular surgery was consulted and he underwent emergent iliofemoral embolectomy.He was started on eliquis for the LL ischemic limb, but later on transitioned to IV heparin as patient agreed to undergo AKA which was completed by vascular surgery on 01/29/20.  Meanwhile he had persistent leukocytosis, and his left groin incision wound with some drainage. Cultures from the wound sent showing rare wbc seen with some gram positive cocci and this culture eventually grew Serratia marcescens and Enterococcus faecalis. He was empirically started on keflex by vascular surgery and subsequently based on the culture sensitivity reports and pharmacy recommendations, this was switched to oral amoxicillin and Bactrim DS.  He also had elevated liver  enzymes which have improved.  He also had mild acute blood loss anemia but his hemoglobin remained over 8 so he did not require any blood transfusion.  He was evaluated by PT OT who recommended SNF.  He is being discharged in stable condition to SNF today.  Per vascular surgery/Dr. Scot Dock recommendations, he is being discharged on Eliquis as well as aspirin.  Discharge Diagnoses:  Principal Problem:   Ischemic leg Active Problems:   Normocytic anemia   Essential hypertension   DM type 2 causing vascular disease ([place])   Rhabdomyolysis   Lower limb ischemia   Pressure injury of skin    Discharge Instructions   Allergies as of 02/01/2020   No Known Allergies     Medication List    STOP taking these medications   Lantus SoloStar 100 UNIT/ML Solostar Pen Generic drug: insulin glargine Replaced by: insulin glargine 100 UNIT/ML injection     TAKE these medications   Accu-Chek Aviva Plus test strip Generic drug: glucose blood CHECK BLOOD SUGAR 4 TIMES DAILY.   acetaminophen 325 MG tablet Commonly known as: TYLENOL Take 2 tablets (650 mg total) by mouth every 6 (six) hours.   amitriptyline 25 MG tablet Commonly known as: ELAVIL Take 25 mg by mouth at bedtime.   amLODipine 10 MG tablet Commonly known as: NORVASC Take 1 tablet (10 mg total) by mouth daily.   amoxicillin 500 MG capsule Commonly known as: AMOXIL Take 1 capsule (500 mg total) by mouth every 8 (eight) hours for 6 days.   apixaban 5 MG Tabs tablet Commonly known as: Eliquis Take 1  tablet (5 mg total) by mouth 2 (two) times daily.   ascorbic acid 500 MG tablet Commonly known as: VITAMIN C Take 1 tablet (500 mg total) by mouth daily.   aspirin 81 MG EC tablet Take 1 tablet (81 mg total) by mouth daily at 6 (six) AM. Swallow whole. Start taking on: February 02, 2020   glipiZIDE 10 MG 24 hr tablet Commonly known as: GLUCOTROL XL Take 1 tablet (10 mg total) by mouth daily.   insulin glargine 100 UNIT/ML  injection Commonly known as: LANTUS Inject 0.5 mLs (50 Units total) into the skin 2 (two) times daily. Replaces: Lantus SoloStar 100 UNIT/ML Solostar Pen   metFORMIN 500 MG 24 hr tablet Commonly known as: GLUCOPHAGE-XR TAKE 1 TABLET BY MOUTH TWICE DAILY AFTER A MEAL What changed: See the new instructions.   metoprolol succinate 25 MG 24 hr tablet Commonly known as: TOPROL-XL Take 1 tablet (25 mg total) by mouth daily.   NovoLOG FlexPen 100 UNIT/ML FlexPen Generic drug: insulin aspart Inject 24-30 Units into the skin 3 (three) times daily with meals.   pantoprazole 40 MG tablet Commonly known as: PROTONIX Take 1 tablet (40 mg total) by mouth daily.   polyethylene glycol 17 g packet Commonly known as: MIRALAX / GLYCOLAX Take 17 g by mouth daily. What changed:   when to take this  reasons to take this   rosuvastatin 10 MG tablet Commonly known as: CRESTOR Take 2 tablets (20 mg total) by mouth daily. What changed: how much to take   sulfamethoxazole-trimethoprim 800-160 MG tablet Commonly known as: BACTRIM DS Take 1 tablet by mouth every 12 (twelve) hours for 6 days.   UltiCare Mini Pen Needles 31G X 6 MM Misc Generic drug: Insulin Pen Needle USE AS DIRECTED   Vitamin D 125 MCG (5000 UT) Caps TAKE (1) CAPSULE BY MOUTH ONCE DAILY. What changed: See the new instructions.   zinc sulfate 220 (50 Zn) MG capsule Take 1 capsule (220 mg total) by mouth daily.       Follow-up Information    Elam Dutch, MD Follow up in 2 week(s).   Specialties: Vascular Surgery, Cardiology Why: office will call Contact information: [place] [address] [phone]        Vidal Schwalbe, MD Follow up in 1 week(s).   Specialty: Family Medicine Contact information: [address] [place] [address] [phone]              No Known Allergies  Consultations: Vascular surgery   Procedures/Studies: CT Head Wo Contrast  Result Date:  01/08/2020 CLINICAL DATA:  Mental status change.  Multiple recent falls. EXAM: CT HEAD WITHOUT CONTRAST CT CERVICAL SPINE WITHOUT CONTRAST TECHNIQUE: Multidetector CT imaging of the head and cervical spine was performed following the standard protocol without intravenous contrast. Multiplanar CT image reconstructions of the cervical spine were also generated. COMPARISON:  Head and cervical spine CTs 08/22/2012 FINDINGS: CT HEAD FINDINGS Brain: There is no evidence of an acute infarct, intracranial hemorrhage, mass, midline shift, or extra-axial fluid collection. Patchy hypodensities in the cerebral white matter bilaterally may have mildly progressed and are nonspecific but compatible with severe chronic small vessel ischemic disease. Mild cerebral atrophy is not abnormal for age. Vascular: Calcified atherosclerosis at the skull base. No hyperdense vessel. Skull: No acute fracture or suspicious osseous lesion. Sinuses/Orbits: The visualized paranasal sinuses and mastoid air cells are clear. Limited assessment of the orbits due to motion. Other: None. CT CERVICAL SPINE  FINDINGS Alignment: Cervical spine straightening.  No listhesis. Skull base and vertebrae: No acute fracture or suspicious osseous lesion. Soft tissues and spinal canal: No prevertebral fluid or swelling. No visible canal hematoma. Disc levels: Chronic mild-to-moderate disc space narrowing at C4-5 with disc bulging and uncovertebral spurring resulting in likely moderate spinal stenosis and moderate right and mild left neural foraminal stenosis. Upper chest: Partially visualized patchy peripheral ground-glass opacity in the right upper lobe. Other: Calcified atherosclerosis at the right greater than left carotid bifurcations. IMPRESSION: 1. No evidence of acute intracranial abnormality. 2. Severe chronic small vessel ischemic disease. 3. No evidence of acute fracture or traumatic subluxation in the cervical spine. 4. Partially visualized patchy  peripheral ground-glass opacity in the right upper lobe, likely infectious/inflammatory. Correlate with pending chest radiograph. Electronically Signed   By: Logan Bores M.D.   On: 01/08/2020 21:33   CT Cervical Spine Wo Contrast  Result Date: 01/08/2020 CLINICAL DATA:  Mental status change.  Multiple recent falls. EXAM: CT HEAD WITHOUT CONTRAST CT CERVICAL SPINE WITHOUT CONTRAST TECHNIQUE: Multidetector CT imaging of the head and cervical spine was performed following the standard protocol without intravenous contrast. Multiplanar CT image reconstructions of the cervical spine were also generated. COMPARISON:  Head and cervical spine CTs 08/22/2012 FINDINGS: CT HEAD FINDINGS Brain: There is no evidence of an acute infarct, intracranial hemorrhage, mass, midline shift, or extra-axial fluid collection. Patchy hypodensities in the cerebral white matter bilaterally may have mildly progressed and are nonspecific but compatible with severe chronic small vessel ischemic disease. Mild cerebral atrophy is not abnormal for age. Vascular: Calcified atherosclerosis at the skull base. No hyperdense vessel. Skull: No acute fracture or suspicious osseous lesion. Sinuses/Orbits: The visualized paranasal sinuses and mastoid air cells are clear. Limited assessment of the orbits due to motion. Other: None. CT CERVICAL SPINE FINDINGS Alignment: Cervical spine straightening.  No listhesis. Skull base and vertebrae: No acute fracture or suspicious osseous lesion. Soft tissues and spinal canal: No prevertebral fluid or swelling. No visible canal hematoma. Disc levels: Chronic mild-to-moderate disc space narrowing at C4-5 with disc bulging and uncovertebral spurring resulting in likely moderate spinal stenosis and moderate right and mild left neural foraminal stenosis. Upper chest: Partially visualized patchy peripheral ground-glass opacity in the right upper lobe. Other: Calcified atherosclerosis at the right greater than left  carotid bifurcations. IMPRESSION: 1. No evidence of acute intracranial abnormality. 2. Severe chronic small vessel ischemic disease. 3. No evidence of acute fracture or traumatic subluxation in the cervical spine. 4. Partially visualized patchy peripheral ground-glass opacity in the right upper lobe, likely infectious/inflammatory. Correlate with pending chest radiograph. Electronically Signed   By: Logan Bores M.D.   On: 01/08/2020 21:33   CT ABDOMEN PELVIS W CONTRAST  Result Date: 01/08/2020 CLINICAL DATA:  Abdominal distension, back pain EXAM: CT ABDOMEN AND PELVIS WITH CONTRAST TECHNIQUE: Multidetector CT imaging of the abdomen and pelvis was performed using the standard protocol following bolus administration of intravenous contrast. CONTRAST:  169mL OMNIPAQUE IOHEXOL 300 MG/ML  SOLN COMPARISON:  04/25/2010 FINDINGS: Lower chest: Bibasilar ground-glass pulmonary infiltrates are present, nonspecific. This may reflect basilar atelectasis or pulmonary edema. Extensive coronary artery calcification. Cardiac size is mildly enlarged. No pericardial effusion. Hepatobiliary: No focal liver abnormality is seen. No gallstones, gallbladder wall thickening, or biliary dilatation. Pancreas: Unremarkable Spleen: Unremarkable Adrenals/Urinary Tract: Adrenal glands are unremarkable. Kidneys are normal, without renal calculi, focal lesion, or hydronephrosis. Bladder is unremarkable. Stomach/Bowel: The stomach, small bowel, and large bowel are unremarkable. Appendix  normal. No free intraperitoneal gas or fluid. Vascular/Lymphatic: Moderate atherosclerotic calcification within the infrarenal abdominal aorta. There is thrombosis of the common and external iliac artery without significant distal reconstitution of the visualized lower extremity arterial outflow clearly identified on this examination. Superimposed moderate atherosclerotic calcification of the left lower extremity arterial outflow. No aortic aneurysm. No  pathologic adenopathy within the abdomen and pelvis. Reproductive: Prostate is unremarkable. Other: Rectum unremarkable Musculoskeletal: No acute bone abnormality IMPRESSION: Bibasilar pulmonary infiltrates, poorly evaluated on this examination, possibly representing pulmonary edema. Extensive coronary artery calcification. Peripheral vascular disease with thrombosis of the left lower extremity arterial inflow. No definite reconstitution of the visualized left lower extremity arterial outflow, though this may be related to bolus timing. Noninvasive left lower extremity arterial examination would be helpful in determining the degree of arteriovascular insufficiency. Aortic Atherosclerosis (ICD10-I70.0). Electronically Signed   By: Fidela Salisbury MD   On: 01/08/2020 22:52   DG Chest Portable 1 View  Result Date: 01/08/2020 CLINICAL DATA:  59 year old male with cough. EXAM: PORTABLE CHEST 1 VIEW COMPARISON:  Chest radiograph dated 08/22/2012. FINDINGS: Faint bilateral hazy interstitial densities primarily involving the mid to lower lung field, and left greater right, new since the prior radiograph and may represent reactive airway disease or atypical infection. Clinical correlation is recommended. No focal consolidation, pleural effusion or pneumothorax. The cardiac silhouette is within limits. No acute osseous pathology. IMPRESSION: Bilateral hazy interstitial densities may represent reactive airway disease or atypical infection. Electronically Signed   By: Anner Crete M.D.   On: 01/08/2020 21:34   DG Abd 2 Views  Result Date: 01/28/2020 CLINICAL DATA:  Constipation EXAM: ABDOMEN - 2 VIEW COMPARISON:  CT abdomen and pelvis January 08, 2020 FINDINGS: Supine and upright images were obtained. There is fairly diffuse stool throughout colon. The colon is not distended due to stool. There is no bowel dilatation or air-fluid level to suggest bowel obstruction. No free air. Lung bases are clear. Scattered foci of  arterial vascular calcification noted in the pelvis. IMPRESSION: Fairly diffuse stool throughout colon without colonic distension from stool. No bowel obstruction or free air evident. Lung bases are clear. Electronically Signed   By: Lowella Grip III M.D.   On: 01/28/2020 13:26   ECHOCARDIOGRAM COMPLETE  Result Date: 01/20/2020    ECHOCARDIOGRAM REPORT   Patient Name:   Genevieve Messerschmidt Date of Exam: 01/20/2020 Medical Rec #:  [id number]           Height:       67.0 in Accession #:    [id number]          Weight:       262.0 lb Date of Birth:  [birth year]           BSA:          2.268 m Patient Age:    62 years            BP:           119/59 mmHg Patient Gender: M                   HR:           71 bpm. Exam Location:  Inpatient Procedure: 2D Echo, Cardiac Doppler and Color Doppler Indications:    Post-Operative State 496759.  History:        Patient has no prior history of Echocardiogram examinations.  Risk Factors:Hypertension, Diabetes and Dyslipidemia. COVID-19.                 GERD.  Sonographer:    Jonelle Sidle Dance Referring Phys: Albert Lea  1. Left ventricular ejection fraction, by estimation, is 50 to 55%. The left ventricle has low normal function. The left ventricle demonstrates global hypokinesis. The left ventricular internal cavity size was mildly dilated. Left ventricular diastolic parameters are indeterminate.  2. Right ventricular systolic function is normal. The right ventricular size is normal.  3. Left atrial size was moderately dilated.  4. The mitral valve is normal in structure. No evidence of mitral valve regurgitation.  5. The aortic valve is grossly normal. There is mild calcification of the aortic valve. Aortic valve regurgitation is not visualized. No aortic stenosis is present. Comparison(s): No prior Echocardiogram. FINDINGS  Left Ventricle: Suggestive of impaired diastology. Left ventricular ejection fraction, by estimation, is 50 to 55%. The  left ventricle has low normal function. The left ventricle demonstrates global hypokinesis. The left ventricular internal cavity size  was mildly dilated. There is no left ventricular hypertrophy. Left ventricular diastolic parameters are indeterminate. Right Ventricle: The right ventricular size is normal. Right vetricular wall thickness was not well visualized. Right ventricular systolic function is normal. Left Atrium: Left atrial size was moderately dilated. Right Atrium: Right atrial size was normal in size. Pericardium: There is no evidence of pericardial effusion. Mitral Valve: The mitral valve is normal in structure. No evidence of mitral valve regurgitation. Tricuspid Valve: The tricuspid valve is grossly normal. Tricuspid valve regurgitation is not demonstrated. Aortic Valve: The aortic valve is grossly normal. There is mild calcification of the aortic valve. Aortic valve regurgitation is not visualized. No aortic stenosis is present. Pulmonic Valve: The pulmonic valve was grossly normal. Pulmonic valve regurgitation is not visualized. Aorta: The aortic root and ascending aorta are structurally normal, with no evidence of dilitation. IAS/Shunts: The atrial septum is grossly normal.  LEFT VENTRICLE PLAX 2D LVIDd:         6.00 cm  Diastology LVIDs:         4.79 cm  LV e' medial:    6.09 cm/s LV PW:         1.10 cm  LV E/e' medial:  12.7 LV IVS:        0.94 cm  LV e' lateral:   9.14 cm/s LVOT diam:     2.20 cm  LV E/e' lateral: 8.4 LV SV:         84 LV SV Index:   37 LVOT Area:     3.80 cm  RIGHT VENTRICLE             IVC RV Basal diam:  3.32 cm     IVC diam: 1.52 cm RV Mid diam:    1.70 cm RV S prime:     16.30 cm/s TAPSE (M-mode): 2.9 cm LEFT ATRIUM             Index       RIGHT ATRIUM           Index LA diam:        4.10 cm 1.81 cm/m  RA Area:     18.00 cm LA Vol (A2C):   95.7 ml 42.19 ml/m RA Volume:   44.80 ml  19.75 ml/m LA Vol (A4C):   73.9 ml 32.58 ml/m LA Biplane Vol: 84.5 ml 37.25 ml/m   AORTIC VALVE LVOT Vmax:  111.00 cm/s LVOT Vmean:  68.900 cm/s LVOT VTI:    0.222 m  AORTA Ao Root diam: 3.20 cm Ao Asc diam:  3.60 cm MITRAL VALVE MV Area (PHT): 3.27 cm    SHUNTS MV Decel Time: 232 msec    Systemic VTI:  0.22 m MV E velocity: 77.10 cm/s  Systemic Diam: 2.20 cm MV A velocity: 70.80 cm/s MV E/A ratio:  1.09 Rudean Haskell MD Electronically signed by Rudean Haskell MD Signature Date/Time: 01/20/2020/12:19:21 PM    Final    VAS [place] LOWER EXTREMITY ARTERIAL DUPLEX  Result Date: 01/20/2020 LOWER EXTREMITY ARTERIAL DUPLEX STUDY Indications: Left BKA with recent left iliofemoral embolectomy 01/19/2020, now              with cool stump and pain.  Current ABI: Not obtained Limitations: patient movement, patient position Comparison Study: No prior study Performing Technologist: Maudry Mayhew MHA, RDMS, RVT, RDCS  Examination Guidelines: A complete evaluation includes B-mode imaging, spectral Doppler, color Doppler, and power Doppler as needed of all accessible portions of each vessel. Bilateral testing is considered an integral part of a complete examination. Limited examinations for reoccurring indications may be performed as noted.  +----------+--------+-----+---------------+-------------------+----------------+ LEFT      PSV cm/sRatioStenosis       Waveform           Comments         +----------+--------+-----+---------------+-------------------+----------------+ CFA Distal102                         monophasic                          +----------+--------+-----+---------------+-------------------+----------------+ DFA       100                         monophasic                          +----------+--------+-----+---------------+-------------------+----------------+ SFA Prox  220          50-74% stenosismonophasic                          +----------+--------+-----+---------------+-------------------+----------------+ SFA Mid   48                           dampened monophasicOccluded just                                                             distal to mid                                                             segment.         +----------+--------+-----+---------------+-------------------+----------------+ SFA Distal             occluded       absent                              +----------+--------+-----+---------------+-------------------+----------------+  POP Mid                occluded       absent                              +----------+--------+-----+---------------+-------------------+----------------+ PTA Prox               occluded       absent                              +----------+--------+-----+---------------+-------------------+----------------+  Summary: Left: Deep femoral artery is patent at the origin. 50-74% stenosis noted in the proximal superficial femoral artery. Superficial femoral artery occludes just distal to the mid segment. Popliteal and posterior tibial artery are occluded.  See table(s) above for measurements and observations. Electronically signed by Curt Jews MD on 01/20/2020 at 4:29:02 PM.    Final       Discharge Exam: Vitals:   02/01/20 0520 02/01/20 0808  BP: 118/79 116/73  Pulse: 85 86  Resp: 16 18  Temp: 98.6 F (37 C) 98.1 F (36.7 C)  SpO2: 98% 97%   Vitals:   01/31/20 1253 01/31/20 1610 02/01/20 0520 02/01/20 0808  BP: 128/69 122/80 118/79 116/73  Pulse: 97 89 85 86  Resp: 16 15 16 18   Temp: 98.7 F (37.1 C) 98.9 F (37.2 C) 98.6 F (37 C) 98.1 F (36.7 C)  TempSrc: Oral Oral Oral Oral  SpO2: 99% 100% 98% 97%  Weight:      Height:        General: Pt is alert, awake, not in acute distress Cardiovascular: RRR, S1/S2 +, no rubs, no gallops Respiratory: CTA bilaterally, no wheezing, no rhonchi Abdominal: Soft, NT, ND, bowel sounds + Extremities: Left AKA, staples in good position with no signs of infection or bleeding.    The results of  significant diagnostics from this hospitalization (including imaging, microbiology, ancillary and laboratory) are listed below for reference.     Microbiology: Recent Results (from the past 240 hour(s))  Aerobic Culture (superficial specimen)     Status: None   Collection Time: 01/26/20  7:56 AM   Specimen: Wound  Result Value Ref Range Status   Specimen Description WOUND LEFT GROIN  Final   Special Requests NONE  Final   Gram Stain   Final    RARE WBC PRESENT, PREDOMINANTLY MONONUCLEAR FEW GRAM POSITIVE COCCI RARE GRAM NEGATIVE RODS Performed at [facility], [address], [place], [place] [address]    Culture   Final    FEW SERRATIA MARCESCENS FEW ENTEROCOCCUS FAECALIS    Report Status 01/29/2020 FINAL  Final   Organism ID, Bacteria SERRATIA MARCESCENS  Final   Organism ID, Bacteria ENTEROCOCCUS FAECALIS  Final      Susceptibility   Enterococcus faecalis - MIC*    AMPICILLIN <=2 SENSITIVE Sensitive     VANCOMYCIN 1 SENSITIVE Sensitive     GENTAMICIN SYNERGY RESISTANT Resistant     * FEW ENTEROCOCCUS FAECALIS   Serratia marcescens - MIC*    CEFAZOLIN >=64 RESISTANT Resistant     CEFEPIME <=0.12 SENSITIVE Sensitive     CEFTAZIDIME <=1 SENSITIVE Sensitive     CEFTRIAXONE <=0.25 SENSITIVE Sensitive     CIPROFLOXACIN <=0.25 SENSITIVE Sensitive     GENTAMICIN <=1 SENSITIVE Sensitive     TRIMETH/SULFA <=20 SENSITIVE Sensitive     * FEW  SERRATIA MARCESCENS  MRSA PCR Screening     Status: None   Collection Time: 01/27/20 12:30 PM   Specimen: Nasal Mucosa; Nasopharyngeal  Result Value Ref Range Status   MRSA by PCR NEGATIVE NEGATIVE Final    Comment:        The GeneXpert MRSA Assay (FDA approved for NASAL specimens only), is one component of a comprehensive MRSA colonization surveillance program. It is not intended to diagnose MRSA infection nor to guide or monitor treatment for MRSA infections. Performed at [facility], [place] [address], [place],  [place] [address]      Labs: BNP (last 3 results) No results for input(s): BNP in the last 8760 hours. Basic Metabolic Panel: Recent Labs  Lab 01/28/20 0000 01/29/20 0113 01/30/20 0148 01/31/20 0231 02/01/20 0231  NA 128* 131* 129* 131* 131*  K 4.5 4.8 5.0 4.5 4.6  CL 94* 95* 93* 94* 98  CO2 24 26 25 24 23   GLUCOSE 230* 252* 286* 204* 228*  BUN 9 9 10 13 14   CREATININE 0.70 0.82 0.70 0.66 0.74  CALCIUM 8.5* 9.2 9.4 9.5 9.4   Liver Function Tests: Recent Labs  Lab 01/28/20 0000 01/30/20 0148  AST 46* 24  ALT 96* 62*  ALKPHOS 165* 147*  BILITOT 0.4 0.4  PROT 7.7 8.5*  ALBUMIN 1.9* 2.1*   No results for input(s): LIPASE, AMYLASE in the last 168 hours. No results for input(s): AMMONIA in the last 168 hours. CBC: Recent Labs  Lab 01/28/20 0000 01/29/20 0113 01/30/20 0148 01/31/20 0231 02/01/20 0231  WBC 13.3* 13.9* 16.2* 15.7* 15.1*  HGB 7.5* 7.6* 7.7* 8.2* 8.0*  HCT 23.4* 23.8* 24.7* 25.7* 25.6*  MCV 83.3 85.0 83.2 83.4 82.6  PLT 712* 795* 796* 997* 933*   Cardiac Enzymes: Recent Labs  Lab 01/28/20 0000  CKTOTAL 509*   BNP: Invalid input(s): POCBNP CBG: Recent Labs  Lab 01/31/20 0621 01/31/20 1141 01/31/20 1624 01/31/20 2149 02/01/20 0643  GLUCAP 220* 262* 218* 251* 170*   D-Dimer No results for input(s): DDIMER in the last 72 hours. Hgb A1c No results for input(s): HGBA1C in the last 72 hours. Lipid Profile No results for input(s): CHOL, HDL, LDLCALC, TRIG, CHOLHDL, LDLDIRECT in the last 72 hours. Thyroid function studies No results for input(s): TSH, T4TOTAL, T3FREE, THYROIDAB in the last 72 hours.  Invalid input(s): FREET3 Anemia work up No results for input(s): VITAMINB12, FOLATE, FERRITIN, TIBC, IRON, RETICCTPCT in the last 72 hours. Urinalysis    Component Value Date/Time   COLORURINE YELLOW 01/08/2020 2029   APPEARANCEUR CLEAR 01/08/2020 2029   APPEARANCEUR Clear 08/22/2012 2000   LABSPEC 1.015 01/08/2020 2029   LABSPEC 1.003  08/22/2012 2000   PHURINE 5.0 01/08/2020 2029   GLUCOSEU NEGATIVE 01/08/2020 2029   GLUCOSEU >=500 08/22/2012 2000   HGBUR LARGE (A) 01/08/2020 2029   BILIRUBINUR NEGATIVE 01/08/2020 2029   BILIRUBINUR Negative 08/22/2012 2000   KETONESUR 5 (A) 01/08/2020 2029   PROTEINUR 30 (A) 01/08/2020 2029   NITRITE NEGATIVE 01/08/2020 2029   LEUKOCYTESUR NEGATIVE 01/08/2020 2029   LEUKOCYTESUR Negative 08/22/2012 2000   Sepsis Labs Invalid input(s): PROCALCITONIN,  WBC,  LACTICIDVEN Microbiology Recent Results (from the past 240 hour(s))  Aerobic Culture (superficial specimen)     Status: None   Collection Time: 01/26/20  7:56 AM   Specimen: Wound  Result Value Ref Range Status   Specimen Description WOUND LEFT GROIN  Final   Special Requests NONE  Final   Gram Stain  Final    RARE WBC PRESENT, PREDOMINANTLY MONONUCLEAR FEW GRAM POSITIVE COCCI RARE GRAM NEGATIVE RODS Performed at [facility], [place] [address] [place], [place] [address]    Culture   Final    FEW SERRATIA MARCESCENS FEW ENTEROCOCCUS FAECALIS    Report Status 01/29/2020 FINAL  Final   Organism ID, Bacteria SERRATIA MARCESCENS  Final   Organism ID, Bacteria ENTEROCOCCUS FAECALIS  Final      Susceptibility   Enterococcus faecalis - MIC*    AMPICILLIN <=2 SENSITIVE Sensitive     VANCOMYCIN 1 SENSITIVE Sensitive     GENTAMICIN SYNERGY RESISTANT Resistant     * FEW ENTEROCOCCUS FAECALIS   Serratia marcescens - MIC*    CEFAZOLIN >=64 RESISTANT Resistant     CEFEPIME <=0.12 SENSITIVE Sensitive     CEFTAZIDIME <=1 SENSITIVE Sensitive     CEFTRIAXONE <=0.25 SENSITIVE Sensitive     CIPROFLOXACIN <=0.25 SENSITIVE Sensitive     GENTAMICIN <=1 SENSITIVE Sensitive     TRIMETH/SULFA <=20 SENSITIVE Sensitive     * FEW SERRATIA MARCESCENS  MRSA PCR Screening     Status: None   Collection Time: 01/27/20 12:30 PM   Specimen: Nasal Mucosa; Nasopharyngeal  Result Value Ref Range Status   MRSA by PCR NEGATIVE NEGATIVE  Final    Comment:        The GeneXpert MRSA Assay (FDA approved for NASAL specimens only), is one component of a comprehensive MRSA colonization surveillance program. It is not intended to diagnose MRSA infection nor to guide or monitor treatment for MRSA infections. Performed at [facility], [place] [address] [place], [place] [address]      Time coordinating discharge: Over 30 minutes  SIGNED:   Darliss Cheney, MD  [facility] 02/01/2020, 11:00 AM  If 7PM-7AM, please contact night-coverage [email or web address]

## 2020-02-01 NOTE — Progress Notes (Signed)
   VASCULAR SURGERY ASSESSMENT & PLAN:   POD 3 LEFT AKA: His AKA is healing nicely.  He has some slight separation in his left groin and will need continued meticulous wound care.  I reviewed his op note, he does not have any prosthetic material in the left groin.  He had an arteriotomy with primary closure.  I think this can be followed as an outpatient.  I was told that he had started his Eliquis last Thursday.  However it looks like he is not on Eliquis.  I will start that today.  His heparin can then be discontinued.  Continue daily dressing changes to left AKA.  He is ready for discharge to a skilled nursing facility from a vascular standpoint.   SUBJECTIVE:   No specific complaints.  PHYSICAL EXAM:   Vitals:   01/31/20 0810 01/31/20 1253 01/31/20 1610 02/01/20 0520  BP: 109/68 128/69 122/80 118/79  Pulse: 99 97 89 85  Resp: 18 16 15 16   Temp: 98.9 F (37.2 C) 98.7 F (37.1 C) 98.9 F (37.2 C) 98.6 F (37 C)  TempSrc: Oral Oral Oral Oral  SpO2: 100% 99% 100% 98%  Weight:      Height:       No change in the separation of his left groin. His left AKA looks fine.  LABS:   Lab Results  Component Value Date   WBC 15.1 (H) 02/01/2020   HGB 8.0 (L) 02/01/2020   HCT 25.6 (L) 02/01/2020   MCV 82.6 02/01/2020   PLT 933 ([place]) 02/01/2020   Lab Results  Component Value Date   CREATININE 0.74 02/01/2020   Lab Results  Component Value Date   INR 1.2 01/29/2020   CBG (last 3)  Recent Labs    01/31/20 1624 01/31/20 2149 02/01/20 0643  GLUCAP 218* 251* 170*    PROBLEM LIST:    Principal Problem:   Ischemic leg Active Problems:   Normocytic anemia   Essential hypertension   DM type 2 causing vascular disease (HCC)   Rhabdomyolysis   Lower limb ischemia   CURRENT MEDS:   . amitriptyline  25 mg Oral QHS  . amLODipine  10 mg Oral Daily  . amoxicillin  500 mg Oral Q8H  . aspirin EC  81 mg Oral Q0600  . docusate sodium  100 mg Oral Daily  . insulin  aspart  0-15 Units Subcutaneous TID WC  . insulin aspart  0-5 Units Subcutaneous QHS  . insulin aspart  4 Units Subcutaneous TID WC  . insulin glargine  45 Units Subcutaneous BID  . metoprolol succinate  25 mg Oral Daily  . pantoprazole  40 mg Oral Daily  . polyethylene glycol  17 g Oral BID  . rosuvastatin  20 mg Oral Daily  . sulfamethoxazole-trimethoprim  1 tablet Oral Q12H    Deitra Mayo Office: [phone] 02/01/2020

## 2020-02-01 NOTE — TOC Transition Note (Signed)
Transition of Care [facility]) - CM/SW Discharge Note   Patient Details  Name: [name] MRN: [id number] Date of Birth: [birth year]  [facility] of [facility] CM/SW Contact:  Andria Frames, [place] Work Phone Number: 02/01/2020, 1:27 PM   Clinical Narrative:     Patient ready for discharge. Patient going to [facility] / [facility], Room 126A. Call to report is [phone] / Legrand Como.    Final next level of care: [place] ([facility] / [facility]) Barriers to Discharge: Barriers Resolved   Patient Goals and CMS Choice        Discharge Placement              Patient chooses bed at: [facility] ([place] / [place]) Patient to be transferred to facility by: [place] Name of family member notified: Lenell Antu. (Son / [phone]) Patient and family notified of of transfer: 02/01/20  Discharge Plan and Services In-house Referral: Clinical Social Work                                   Social Determinants of Health (SDOH) Interventions     Readmission Risk Interventions No flowsheet data found.

## 2020-02-01 NOTE — Progress Notes (Signed)
MSW Intern contacted patient's son to advise of discharge process, facility contact information.      02/01/20 1230  TOC Discharge Assessment  Final next level of care [place] ([facility] / [place])  Barriers to Discharge Barriers Resolved  Patient chooses bed at [facility] ([facility])  Patient to be transferred to facility by [place]  Name of family member notified Lenell Antu. (Son / [phone])  Patient and family notified of of transfer 02/01/20

## 2020-02-02 LAB — TYPE AND SCREEN
ABO/RH(D): O POS
Antibody Screen: NEGATIVE
Unit division: 0
Unit division: 0
Unit division: 0
Unit division: 0

## 2020-02-02 LAB — BPAM RBC
Blood Product Expiration Date: 202111262359
Blood Product Expiration Date: 202111272359
Blood Product Expiration Date: 202111272359
Blood Product Expiration Date: 202111272359
ISSUE DATE / TIME: 202110280811
ISSUE DATE / TIME: 202110280811
Unit Type and Rh: 5100
Unit Type and Rh: 5100
Unit Type and Rh: 5100
Unit Type and Rh: 5100

## 2020-02-03 ENCOUNTER — Telehealth: Payer: Self-pay

## 2020-02-03 NOTE — Telephone Encounter (Signed)
MD Dollene Primrose called from the wound care center to report patient's entire groin wound is necrotic and heavily draining serosanguinous fluid s/p L fem endarterectomy. Expedited patient's f/u appt to this week. Per MD, the L AKA is healing well.

## 2020-02-05 ENCOUNTER — Other Ambulatory Visit: Payer: Self-pay

## 2020-02-05 ENCOUNTER — Ambulatory Visit (INDEPENDENT_AMBULATORY_CARE_PROVIDER_SITE_OTHER): Payer: Self-pay | Admitting: Physician Assistant

## 2020-02-05 VITALS — BP 110/69 | HR 87 | Temp 97.3°F | Resp 20 | Ht 67.0 in | Wt 195.0 lb

## 2020-02-05 DIAGNOSIS — I70229 Atherosclerosis of native arteries of extremities with rest pain, unspecified extremity: Secondary | ICD-10-CM

## 2020-02-05 NOTE — H&P (View-Only) (Signed)
POST OPERATIVE OFFICE NOTE    CC:  F/u for surgery  HPI:  This is a 59 y.o. male who is s/p left iliofemoral embolectomy on 01/19/2020 by Dr. Oneida Alar.    He initially presented with 36-48 hrs of coolness and pain in the left BKA stump.  He had his amputation in 2019 by [name] and had chronic pain since then but worse recently.  Pt uses prosthetic for transfers.   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   He was felt that his acute ischemia in the left BKA was most likely secondary to hypercoagulable state from covid.  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 Trula Slade.  His wound cultures showed GPC with Serratia marcescens and Enterococcus faecalis and he was started on Keflex & then switched to oral amoxicillin and Bactrim DS.   He was discharged from the hospital on 10/31 & at that time, his AKA was healing nicely.  He had some slight separation in the left groin that would need meticulous wound care.  He does not have any prosthetic material in the left groin as he had an arteriotomy with primary closure.  He had been started on Eliquis.  He was discharged to SNF.  Pt returns today for follow up.  MD from wound care center called to report pt's entire groin wound was necrotic with heavy drainage of serosanguinous fluid and the pt's post operative appointment was moved up to today for wound check.    He is still in the SNF.  He is not a good historian.  He states that he has been working with PT but not transferring yet.  He denies any fever or chills.  Otherwise, he really can't tell me anything about his incisions since he was discharged.  He remains on Amoxicillin and Bactrim with end date for 02/07/2020.   No Known Allergies  Current Outpatient Medications  Medication Sig Dispense Refill  . acetaminophen (TYLENOL) 325 MG tablet Take 2 tablets (650 mg total) by mouth every 6 (six) hours. 240 tablet 0   . amitriptyline (ELAVIL) 25 MG tablet Take 25 mg by mouth at bedtime.    Marland Kitchen amLODipine (NORVASC) 10 MG tablet Take 1 tablet (10 mg total) by mouth daily. 30 tablet 0  . amoxicillin (AMOXIL) 500 MG capsule Take 1 capsule (500 mg total) by mouth every 8 (eight) hours for 6 days. 18 capsule 0  . apixaban (ELIQUIS) 5 MG TABS tablet Take 1 tablet (5 mg total) by mouth 2 (two) times daily. 60 tablet 0  . ascorbic acid (VITAMIN C) 500 MG tablet Take 1 tablet (500 mg total) by mouth daily. 30 tablet 0  . aspirin EC 81 MG EC tablet Take 1 tablet (81 mg total) by mouth daily at 6 (six) AM. Swallow whole. 30 tablet 0  . Cholecalciferol (VITAMIN D) 125 MCG (5000 UT) CAPS TAKE (1) CAPSULE BY MOUTH ONCE DAILY. (Patient taking differently: Take 5,000 Units by mouth daily. ) 30 capsule 2  . glipiZIDE (GLUCOTROL XL) 10 MG 24 hr tablet Take 1 tablet (10 mg total) by mouth daily. 90 tablet 1  . glucose blood (ACCU-CHEK AVIVA PLUS) test strip CHECK BLOOD SUGAR 4 TIMES DAILY. 150 strip 2  . insulin aspart (NOVOLOG FLEXPEN) 100 UNIT/ML FlexPen Inject 24-30 Units into the skin 3 (three) times daily with meals. 30 mL 2  . insulin glargine (LANTUS) 100 UNIT/ML  injection Inject 0.5 mLs (50 Units total) into the skin 2 (two) times daily. 30 mL 0  . Insulin Pen Needle (ULTICARE MINI PEN NEEDLES) 31G X 6 MM MISC USE AS DIRECTED 100 each 2  . metFORMIN (GLUCOPHAGE-XR) 500 MG 24 hr tablet TAKE 1 TABLET BY MOUTH TWICE DAILY AFTER A MEAL (Patient taking differently: Take 500 mg by mouth 2 (two) times daily. After meals) 60 tablet 11  . metoprolol succinate (TOPROL-XL) 25 MG 24 hr tablet Take 1 tablet (25 mg total) by mouth daily. 30 tablet 0  . pantoprazole (PROTONIX) 40 MG tablet Take 1 tablet (40 mg total) by mouth daily. 30 tablet 0  . polyethylene glycol (MIRALAX / GLYCOLAX) packet Take 17 g by mouth daily. (Patient taking differently: Take 17 g by mouth daily as needed for moderate constipation. ) 30 each 0  . rosuvastatin  (CRESTOR) 10 MG tablet Take 2 tablets (20 mg total) by mouth daily. 90 tablet 3  . sulfamethoxazole-trimethoprim (BACTRIM DS) 800-160 MG tablet Take 1 tablet by mouth every 12 (twelve) hours for 6 days. 12 tablet 0  . zinc sulfate 220 (50 Zn) MG capsule Take 1 capsule (220 mg total) by mouth daily. 30 capsule 0   No current facility-administered medications for this visit.     ROS:  See HPI  Physical Exam:  Today's Vitals   02/05/20 1303  BP: 110/69  Pulse: 87  Resp: 20  Temp: (!) 97.3 F (36.3 C)  TempSrc: Temporal  SpO2: 97%  Weight: 195 lb (88.5 kg)  Height: 5\' 7"  (1.702 m)  PainSc: 10-Worst pain ever   Body mass index is 30.54 kg/m.   Incision:  Left groin incision with superficial dehiscence with serous drainage.  Cotton tip applicator did not probe deep.  Left AKA site looks good with staples in tact and serosanguinous drainage from mid incision.   Assessment/Plan:  This is a 59 y.o. male who is s/p: s/p left iliofemoral embolectomy on 01/19/2020 by Dr. Oneida Alar and subsequent left AKA on 01/29/2020 by Dr. Arita Miss who presents today for wound check of his left groin.  -pt seen and examined with Dr. Oneida Alar.  Will plan for debridement of left groin with wound vac placement in OR on 02/09/2020.  He will need to be off of his Eliquis starting 02/06/2020 for surgery on 11/8.   -Bactrim and Amoxicillin scheduled to end on 11/6.  Will extend his abx through Monday until surgery.  -keep dry dressing to left groin and AKA site.    Leontine Locket, [facility] [phone]  Clinic MD:  Oneida Alar

## 2020-02-05 NOTE — Progress Notes (Signed)
POST OPERATIVE OFFICE NOTE    CC:  F/u for surgery  HPI:  This is a 59 y.o. male who is s/p left iliofemoral embolectomy on 01/19/2020 by Dr. Oneida Alar.    He initially presented with 36-48 hrs of coolness and pain in the left BKA stump.  He had his amputation in 2019 by [name] and had chronic pain since then but worse recently.  Pt uses prosthetic for transfers.   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   He was felt that his acute ischemia in the left BKA was most likely secondary to hypercoagulable state from covid.  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 Trula Slade.  His wound cultures showed GPC with Serratia marcescens and Enterococcus faecalis and he was started on Keflex & then switched to oral amoxicillin and Bactrim DS.   He was discharged from the hospital on 10/31 & at that time, his AKA was healing nicely.  He had some slight separation in the left groin that would need meticulous wound care.  He does not have any prosthetic material in the left groin as he had an arteriotomy with primary closure.  He had been started on Eliquis.  He was discharged to SNF.  Pt returns today for follow up.  MD from wound care center called to report pt's entire groin wound was necrotic with heavy drainage of serosanguinous fluid and the pt's post operative appointment was moved up to today for wound check.    He is still in the SNF.  He is not a good historian.  He states that he has been working with PT but not transferring yet.  He denies any fever or chills.  Otherwise, he really can't tell me anything about his incisions since he was discharged.  He remains on Amoxicillin and Bactrim with end date for 02/07/2020.   No Known Allergies  Current Outpatient Medications  Medication Sig Dispense Refill  . acetaminophen (TYLENOL) 325 MG tablet Take 2 tablets (650 mg total) by mouth every 6 (six) hours. 240 tablet 0    . amitriptyline (ELAVIL) 25 MG tablet Take 25 mg by mouth at bedtime.    Marland Kitchen amLODipine (NORVASC) 10 MG tablet Take 1 tablet (10 mg total) by mouth daily. 30 tablet 0  . amoxicillin (AMOXIL) 500 MG capsule Take 1 capsule (500 mg total) by mouth every 8 (eight) hours for 6 days. 18 capsule 0  . apixaban (ELIQUIS) 5 MG TABS tablet Take 1 tablet (5 mg total) by mouth 2 (two) times daily. 60 tablet 0  . ascorbic acid (VITAMIN C) 500 MG tablet Take 1 tablet (500 mg total) by mouth daily. 30 tablet 0  . aspirin EC 81 MG EC tablet Take 1 tablet (81 mg total) by mouth daily at 6 (six) AM. Swallow whole. 30 tablet 0  . Cholecalciferol (VITAMIN D) 125 MCG (5000 UT) CAPS TAKE (1) CAPSULE BY MOUTH ONCE DAILY. (Patient taking differently: Take 5,000 Units by mouth daily. ) 30 capsule 2  . glipiZIDE (GLUCOTROL XL) 10 MG 24 hr tablet Take 1 tablet (10 mg total) by mouth daily. 90 tablet 1  . glucose blood (ACCU-CHEK AVIVA PLUS) test strip CHECK BLOOD SUGAR 4 TIMES DAILY. 150 strip 2  . insulin aspart (NOVOLOG FLEXPEN) 100 UNIT/ML FlexPen Inject 24-30 Units into the skin 3 (three) times daily with meals. 30 mL 2  . insulin glargine (LANTUS) 100  UNIT/ML injection Inject 0.5 mLs (50 Units total) into the skin 2 (two) times daily. 30 mL 0  . Insulin Pen Needle (ULTICARE MINI PEN NEEDLES) 31G X 6 MM MISC USE AS DIRECTED 100 each 2  . metFORMIN (GLUCOPHAGE-XR) 500 MG 24 hr tablet TAKE 1 TABLET BY MOUTH TWICE DAILY AFTER A MEAL (Patient taking differently: Take 500 mg by mouth 2 (two) times daily. After meals) 60 tablet 11  . metoprolol succinate (TOPROL-XL) 25 MG 24 hr tablet Take 1 tablet (25 mg total) by mouth daily. 30 tablet 0  . pantoprazole (PROTONIX) 40 MG tablet Take 1 tablet (40 mg total) by mouth daily. 30 tablet 0  . polyethylene glycol (MIRALAX / GLYCOLAX) packet Take 17 g by mouth daily. (Patient taking differently: Take 17 g by mouth daily as needed for moderate constipation. ) 30 each 0  . rosuvastatin  (CRESTOR) 10 MG tablet Take 2 tablets (20 mg total) by mouth daily. 90 tablet 3  . sulfamethoxazole-trimethoprim (BACTRIM DS) 800-160 MG tablet Take 1 tablet by mouth every 12 (twelve) hours for 6 days. 12 tablet 0  . zinc sulfate 220 (50 Zn) MG capsule Take 1 capsule (220 mg total) by mouth daily. 30 capsule 0   No current facility-administered medications for this visit.     ROS:  See HPI  Physical Exam:  Today's Vitals   02/05/20 1303  BP: 110/69  Pulse: 87  Resp: 20  Temp: (!) 97.3 F (36.3 C)  TempSrc: Temporal  SpO2: 97%  Weight: 195 lb (88.5 kg)  Height: 5\' 7"  (1.702 m)  PainSc: 10-Worst pain ever   Body mass index is 30.54 kg/m.   Incision:  Left groin incision with superficial dehiscence with serous drainage.  Cotton tip applicator did not probe deep.  Left AKA site looks good with staples in tact and serosanguinous drainage from mid incision.   Assessment/Plan:  This is a 59 y.o. male who is s/p: s/p left iliofemoral embolectomy on 01/19/2020 by Dr. Oneida Alar and subsequent left AKA on 01/29/2020 by Dr. Arita Miss who presents today for wound check of his left groin.  -pt seen and examined with Dr. Oneida Alar.  Will plan for debridement of left groin with wound vac placement in OR on 02/09/2020.  He will need to be off of his Eliquis starting 02/06/2020 for surgery on 11/8.   -Bactrim and Amoxicillin scheduled to end on 11/6.  Will extend his abx through Monday until surgery.  -keep dry dressing to left groin and AKA site.    Leontine Locket, [facility] [phone]  Clinic MD:  Oneida Alar

## 2020-02-06 ENCOUNTER — Encounter (HOSPITAL_COMMUNITY): Payer: Self-pay | Admitting: Vascular Surgery

## 2020-02-06 NOTE — Progress Notes (Signed)
Pt is a resident at [facility] in [place]. I spoke with [place], LPN and she states pt is alert, oriented and can speak for himself. Pt is diabetic. Last A1C was 9.8 pm 01/08/20. Dolly states pt's fasting blood sugar is usually between 120-130.   Pt had Covid in October. No symptoms at this time. No Covid test needed.   Pt will arrive via [facility].   Please written instructions about his insulin.

## 2020-02-06 NOTE — Pre-Procedure Instructions (Signed)
[name]  02/06/2020    [name]'s procedure is scheduled on Monday, 02/09/20 at 1:55 PM.   Report to [facility] Entrance "A" Admitting Office at 11:30 AM.   Call this number if you have problems the morning of surgery: [phone]   Remember:  [name] is not to eat or drink after midnight Sunday, 02/08/20.  Take these medicines the morning of surgery with A SIP OF WATER: Aspirin, Amlodipine (Norvasc), Metoprolol (Toprol XL), Pantoprazole (Protonix), Rosuvastatin (Crestor), Acetaminophen (Tylenol)  Sunday PM,  Please give patient 1/2 of his regular dose of Lantus, he will get 25 units. Morning of surgery give him again 1/2 dose of Lantus, 25 units.  Pt is not to take Metformin or Glipizide the day of surgery.  Morning of surgery check patient's blood sugar when he awakens and every 2 hours until he leaves for the hospital. If blood sugar is >220 take 1/2 of usual correction dose of Novolog insulin. If blood sugar is 70 or below, treat with 1/2 cup of clear juice (apple or cranberry) and recheck blood sugar 15 minutes after drinking juice. If blood sugar continues to be 70 or below, call the Short Stay department and ask to speak to a nurse.  Please hold Zinc and Vitamin D prior to surgery.  Per instructions, Dr. Suella Broad, Anesthesiologist/Channel Papandrea Stan Head, RN    Do not wear jewelry.  Do not wear lotions, powders, cologne or deodorant.  Men may shave face and neck.  Do not bring valuables to the hospital.  [facility] is not responsible for any belongings or valuables.  Contacts, dentures or bridgework may not be worn into surgery.  Leave your suitcase in the car.  After surgery it may be brought to your room.  For patients admitted to the hospital, discharge time will be determined by your treatment team.

## 2020-02-06 NOTE — Progress Notes (Addendum)
SDW call Calling to [facility] / [facility], Room 126A. Call to report is [phone]   PCP -  Cardiologist -   PPM/ICD -  Device Orders -  Rep Notified -   Chest x-ray - 01/08/20 EKG - 01/09/20 Stress Test -  ECHO - 10/21 Cardiac Cath -   Sleep Study -  CPAP -   DM type II  Blood Thinner Instructions: Per Leontine Locket PA Eliquis to stop 02/06/20 Aspirin Instructions: Continuing through DOS  COVID TEST- Not needed tested positive in October 2021  Anesthesia review - Yes recent admission   Your procedure is scheduled on November 8th Monday.  Report to [facility] Main Entrance "A" at 11 A.M., and check in at the Admitting office.  Call this number if you have problems the morning of surgery:  [phone]  Call [phone] if you have any questions prior to your surgery date Monday-Friday 8am-4pm    Remember:  Do not eat or drink anything after midnight the night before your surgery   Take these medicines the morning of surgery with A SIP OF WATER  Metoprolol, Amlodipine, Amoxicillin, Aspirin, Protonix, Crestor, Bactrim   Per Leontine Locket PA at [facility] Stop Eliquis on 02/06/20 As of today, STOP taking any Aleve, Naproxen, Ibuprofen, Motrin, Advil, Goody's, BC's, all herbal medications, fish oil, and all vitamins.   WHAT DO I DO ABOUT MY DIABETES MEDICATION?   Marland Kitchen Do not take oral diabetes medicines (Metformin) the morning of surgery.  . THE NIGHT BEFORE SURGERY, take:  No bedtime dose of Novolog insulin  1/2 dose of Lantus    THE MORNING OF SURGERY, take  1/2 dose of Lantus  If CBG > 220 take 1/2 usual dose of Novolog insulin  . The day of surgery, do not take other diabetes injectables, including Byetta (exenatide), Bydureon (exenatide ER), Victoza (liraglutide), or Trulicity (dulaglutide).  . If your CBG is greater than 220 mg/dL, you may take  of your sliding scale (correction) dose of insulin.   HOW TO MANAGE YOUR DIABETES BEFORE AND AFTER  SURGERY  Why is it important to control my blood sugar before and after surgery? . Improving blood sugar levels before and after surgery helps healing and can limit problems. . A way of improving blood sugar control is eating a healthy diet by: o  Eating less sugar and carbohydrates o  Increasing activity/exercise o  Talking with your doctor about reaching your blood sugar goals . High blood sugars (greater than 180 mg/dL) can raise your risk of infections and slow your recovery, so you will need to focus on controlling your diabetes during the weeks before surgery. . Make sure that the doctor who takes care of your diabetes knows about your planned surgery including the date and location.  How do I manage my blood sugar before surgery? . Check your blood sugar at least 4 times a day, starting 2 days before surgery, to make sure that the level is not too high or low. . Check your blood sugar the morning of your surgery when you wake up and every 2 hours until you get to the Short Stay unit. o If your blood sugar is less than 70 mg/dL, you will need to treat for low blood sugar: - Do not take insulin. - Treat a low blood sugar (less than 70 mg/dL) with  cup of clear juice (cranberry or apple), 4 glucose tablets, OR glucose gel. - Recheck blood sugar in 15 minutes after treatment (  to make sure it is greater than 70 mg/dL). If your blood sugar is not greater than 70 mg/dL on recheck, call [phone] for further instructions. . Report your blood sugar to the short stay nurse when you get to Short Stay.  . If you are admitted to the hospital after surgery: o Your blood sugar will be checked by the staff and you will probably be given insulin after surgery (instead of oral diabetes medicines) to make sure you have good blood sugar levels. o The goal for blood sugar control after surgery is 80-180 mg/dL.                           Do not wear jewelry            Do not wear lotions,  powders,colognes, or deodorant.            Men may shave face and neck.            Do not bring valuables to the hospital.            [facility] is not responsible for any belongings or valuables.  Do NOT Smoke (Tobacco/Vaping) or drink Alcohol 24 hours prior to your procedure If you use a CPAP at night, you may bring all equipment for your overnight stay.   Contacts, glasses, dentures or bridgework may not be worn into surgery.      For patients admitted to the hospital, discharge time will be determined by your treatment team.   Patients discharged the day of surgery will not be allowed to drive home, and someone needs to stay with them for 24 hours.    Special instructions:   [place]- Preparing For Surgery  Oral Hygiene is also important to reduce your risk of infection.  Remember - BRUSH YOUR TEETH THE MORNING OF SURGERY WITH YOUR REGULAR TOOTHPASTE    Please follow these instructions carefully.   1. Shower the NIGHT BEFORE SURGERY and the MORNING OF SURGERY with Dial Soap.   2. Wash thoroughly, paying special attention to the area where your surgery will be performed.  3. Thoroughly rinse your body with warm water from the neck down.  4. Pat yourself dry with a CLEAN TOWEL.  5. Wear CLEAN PAJAMAS to bed the night before surgery  6. Place CLEAN SHEETS on your bed the night of your first shower.   Day of Surgery: Wear Clean/Comfortable clothing the morning of surgery Do not apply any deodorants/lotions.   Remember to brush your teeth WITH YOUR REGULAR TOOTHPASTE.   Please read over the following fact sheets that you were given.

## 2020-02-09 ENCOUNTER — Inpatient Hospital Stay (HOSPITAL_COMMUNITY): Payer: Medicaid Other | Admitting: Physician Assistant

## 2020-02-09 ENCOUNTER — Other Ambulatory Visit: Payer: Self-pay

## 2020-02-09 ENCOUNTER — Inpatient Hospital Stay (HOSPITAL_COMMUNITY)
Admission: RE | Admit: 2020-02-09 | Discharge: 2020-02-12 | DRG: 903 | Disposition: A | Discharge status: Skilled Nursing Facility | Payer: Medicaid Other | Source: Skilled Nursing Facility | Attending: Vascular Surgery | Admitting: Vascular Surgery

## 2020-02-09 ENCOUNTER — Encounter (HOSPITAL_COMMUNITY): Payer: Self-pay | Admitting: Vascular Surgery

## 2020-02-09 ENCOUNTER — Encounter (HOSPITAL_COMMUNITY)
Admission: RE | Disposition: A | Discharge status: Skilled Nursing Facility | Payer: Self-pay | Source: Skilled Nursing Facility | Attending: Vascular Surgery

## 2020-02-09 DIAGNOSIS — E119 Type 2 diabetes mellitus without complications: Secondary | ICD-10-CM | POA: Diagnosis present

## 2020-02-09 DIAGNOSIS — Z89612 Acquired absence of left leg above knee: Secondary | ICD-10-CM

## 2020-02-09 DIAGNOSIS — Z794 Long term (current) use of insulin: Secondary | ICD-10-CM

## 2020-02-09 DIAGNOSIS — E785 Hyperlipidemia, unspecified: Secondary | ICD-10-CM | POA: Diagnosis present

## 2020-02-09 DIAGNOSIS — Z20822 Contact with and (suspected) exposure to covid-19: Secondary | ICD-10-CM | POA: Diagnosis present

## 2020-02-09 DIAGNOSIS — Z7982 Long term (current) use of aspirin: Secondary | ICD-10-CM

## 2020-02-09 DIAGNOSIS — Y838 Other surgical procedures as the cause of abnormal reaction of the patient, or of later complication, without mention of misadventure at the time of the procedure: Secondary | ICD-10-CM | POA: Diagnosis present

## 2020-02-09 DIAGNOSIS — G4700 Insomnia, unspecified: Secondary | ICD-10-CM | POA: Diagnosis present

## 2020-02-09 DIAGNOSIS — I10 Essential (primary) hypertension: Secondary | ICD-10-CM | POA: Diagnosis present

## 2020-02-09 DIAGNOSIS — Z7901 Long term (current) use of anticoagulants: Secondary | ICD-10-CM | POA: Diagnosis not present

## 2020-02-09 DIAGNOSIS — T8189XS Other complications of procedures, not elsewhere classified, sequela: Secondary | ICD-10-CM

## 2020-02-09 DIAGNOSIS — M109 Gout, unspecified: Secondary | ICD-10-CM | POA: Diagnosis present

## 2020-02-09 DIAGNOSIS — Z79899 Other long term (current) drug therapy: Secondary | ICD-10-CM | POA: Diagnosis not present

## 2020-02-09 DIAGNOSIS — T8131XA Disruption of external operation (surgical) wound, not elsewhere classified, initial encounter: Principal | ICD-10-CM | POA: Diagnosis present

## 2020-02-09 DIAGNOSIS — K219 Gastro-esophageal reflux disease without esophagitis: Secondary | ICD-10-CM | POA: Diagnosis present

## 2020-02-09 DIAGNOSIS — I9789 Other postprocedural complications and disorders of the circulatory system, not elsewhere classified: Secondary | ICD-10-CM

## 2020-02-09 DIAGNOSIS — Z8616 Personal history of COVID-19: Secondary | ICD-10-CM | POA: Diagnosis not present

## 2020-02-09 DIAGNOSIS — T8189XA Other complications of procedures, not elsewhere classified, initial encounter: Secondary | ICD-10-CM | POA: Diagnosis present

## 2020-02-09 DIAGNOSIS — Z7984 Long term (current) use of oral hypoglycemic drugs: Secondary | ICD-10-CM | POA: Diagnosis not present

## 2020-02-09 HISTORY — PX: WOUND DEBRIDEMENT: SHX247

## 2020-02-09 HISTORY — DX: COVID-19: U07.1

## 2020-02-09 HISTORY — PX: APPLICATION OF WOUND VAC: SHX5189

## 2020-02-09 LAB — CBC
HCT: 28.8 % — ABNORMAL LOW (ref 39.0–52.0)
Hemoglobin: 8.7 g/dL — ABNORMAL LOW (ref 13.0–17.0)
MCH: 26.4 pg (ref 26.0–34.0)
MCHC: 30.2 g/dL (ref 30.0–36.0)
MCV: 87.3 fL (ref 80.0–100.0)
Platelets: 767 10*3/uL — ABNORMAL HIGH (ref 150–400)
RBC: 3.3 MIL/uL — ABNORMAL LOW (ref 4.22–5.81)
RDW: 15.1 % (ref 11.5–15.5)
WBC: 8.5 10*3/uL (ref 4.0–10.5)
nRBC: 0 % (ref 0.0–0.2)

## 2020-02-09 LAB — GLUCOSE, CAPILLARY
Glucose-Capillary: 126 mg/dL — ABNORMAL HIGH (ref 70–99)
Glucose-Capillary: 110 mg/dL — ABNORMAL HIGH (ref 70–99)
Glucose-Capillary: 98 mg/dL (ref 70–99)

## 2020-02-09 SURGERY — DEBRIDEMENT, WOUND
Anesthesia: General | Site: Groin | Laterality: Left

## 2020-02-09 MED ORDER — CEFAZOLIN SODIUM-DEXTROSE 2-4 GM/100ML-% IV SOLN
2.0000 g | Freq: Three times a day (TID) | INTRAVENOUS | Status: AC
  Administered 2020-02-10 (×2): 2 g via INTRAVENOUS
  Filled 2020-02-09 (×2): qty 100

## 2020-02-09 MED ORDER — ASPIRIN EC 81 MG PO TBEC
81.0000 mg | DELAYED_RELEASE_TABLET | Freq: Every day | ORAL | Status: DC
  Administered 2020-02-10 – 2020-02-12 (×3): 81 mg via ORAL
  Filled 2020-02-09 (×3): qty 1

## 2020-02-09 MED ORDER — MIDAZOLAM HCL 2 MG/2ML IJ SOLN
INTRAMUSCULAR | Status: DC | PRN
  Administered 2020-02-09: 2 mg via INTRAVENOUS

## 2020-02-09 MED ORDER — APIXABAN 5 MG PO TABS
5.0000 mg | ORAL_TABLET | Freq: Two times a day (BID) | ORAL | Status: DC
  Administered 2020-02-10 – 2020-02-12 (×5): 5 mg via ORAL
  Filled 2020-02-09 (×5): qty 1

## 2020-02-09 MED ORDER — FENTANYL CITRATE (PF) 100 MCG/2ML IJ SOLN
25.0000 ug | INTRAMUSCULAR | Status: DC | PRN
  Administered 2020-02-09 (×2): 25 ug via INTRAVENOUS

## 2020-02-09 MED ORDER — PROTAMINE SULFATE 10 MG/ML IV SOLN
INTRAVENOUS | Status: AC
  Filled 2020-02-09: qty 15

## 2020-02-09 MED ORDER — ONDANSETRON HCL 4 MG/2ML IJ SOLN
INTRAMUSCULAR | Status: AC
  Filled 2020-02-09: qty 2

## 2020-02-09 MED ORDER — ONDANSETRON HCL 4 MG/2ML IJ SOLN
INTRAMUSCULAR | Status: DC | PRN
  Administered 2020-02-09: 4 mg via INTRAVENOUS

## 2020-02-09 MED ORDER — LACTATED RINGERS IV SOLN: INTRAVENOUS | Status: DC

## 2020-02-09 MED ORDER — INSULIN ASPART 100 UNIT/ML ~~LOC~~ SOLN
0.0000 [IU] | Freq: Three times a day (TID) | SUBCUTANEOUS | Status: DC
  Administered 2020-02-10: 11 [IU] via SUBCUTANEOUS
  Administered 2020-02-10 (×2): 5 [IU] via SUBCUTANEOUS
  Administered 2020-02-11: 3 [IU] via SUBCUTANEOUS
  Administered 2020-02-11 (×2): 5 [IU] via SUBCUTANEOUS
  Administered 2020-02-12: 2 [IU] via SUBCUTANEOUS

## 2020-02-09 MED ORDER — POTASSIUM CHLORIDE CRYS ER 20 MEQ PO TBCR: 20.0000 meq | EXTENDED_RELEASE_TABLET | Freq: Every day | ORAL | Status: DC | PRN

## 2020-02-09 MED ORDER — PROPOFOL 10 MG/ML IV BOLUS
INTRAVENOUS | Status: DC | PRN
  Administered 2020-02-09: 160 mg via INTRAVENOUS
  Administered 2020-02-09: 40 mg via INTRAVENOUS

## 2020-02-09 MED ORDER — FENTANYL CITRATE (PF) 100 MCG/2ML IJ SOLN
INTRAMUSCULAR | Status: AC
  Filled 2020-02-09: qty 2

## 2020-02-09 MED ORDER — 0.9 % SODIUM CHLORIDE (POUR BTL) OPTIME
TOPICAL | Status: DC | PRN
  Administered 2020-02-09: 1000 mL

## 2020-02-09 MED ORDER — EPHEDRINE 5 MG/ML INJ
INTRAVENOUS | Status: AC
  Filled 2020-02-09: qty 10

## 2020-02-09 MED ORDER — CEFAZOLIN SODIUM-DEXTROSE 2-4 GM/100ML-% IV SOLN
2.0000 g | INTRAVENOUS | Status: AC
  Administered 2020-02-09: 2 g via INTRAVENOUS
  Filled 2020-02-09: qty 100

## 2020-02-09 MED ORDER — SODIUM CHLORIDE 0.9 % IV SOLN: INTRAVENOUS | Status: AC

## 2020-02-09 MED ORDER — SODIUM CHLORIDE 0.9 % IV SOLN: INTRAVENOUS | Status: DC

## 2020-02-09 MED ORDER — LACTATED RINGERS IV SOLN: INTRAVENOUS | Status: DC | PRN

## 2020-02-09 MED ORDER — LIDOCAINE 2% (20 MG/ML) 5 ML SYRINGE
INTRAMUSCULAR | Status: DC | PRN
  Administered 2020-02-09: 100 mg via INTRAVENOUS

## 2020-02-09 MED ORDER — PHENYLEPHRINE 40 MCG/ML (10ML) SYRINGE FOR IV PUSH (FOR BLOOD PRESSURE SUPPORT)
PREFILLED_SYRINGE | INTRAVENOUS | Status: DC | PRN
  Administered 2020-02-09 (×2): 120 ug via INTRAVENOUS

## 2020-02-09 MED ORDER — ACETAMINOPHEN 650 MG RE SUPP: 325.0000 mg | RECTAL | Status: DC | PRN

## 2020-02-09 MED ORDER — PHENYLEPHRINE 40 MCG/ML (10ML) SYRINGE FOR IV PUSH (FOR BLOOD PRESSURE SUPPORT)
PREFILLED_SYRINGE | INTRAVENOUS | Status: AC
  Filled 2020-02-09: qty 20

## 2020-02-09 MED ORDER — PHENOL 1.4 % MT LIQD: 1.0000 | OROMUCOSAL | Status: DC | PRN

## 2020-02-09 MED ORDER — MORPHINE SULFATE (PF) 2 MG/ML IV SOLN: 2.0000 mg | INTRAVENOUS | Status: DC | PRN

## 2020-02-09 MED ORDER — DEXAMETHASONE SODIUM PHOSPHATE 10 MG/ML IJ SOLN
INTRAMUSCULAR | Status: DC | PRN
  Administered 2020-02-09: 4 mg via INTRAVENOUS

## 2020-02-09 MED ORDER — PANTOPRAZOLE SODIUM 40 MG PO TBEC
40.0000 mg | DELAYED_RELEASE_TABLET | Freq: Every day | ORAL | Status: DC
  Administered 2020-02-10 – 2020-02-12 (×3): 40 mg via ORAL
  Filled 2020-02-09 (×3): qty 1

## 2020-02-09 MED ORDER — ALUM & MAG HYDROXIDE-SIMETH 200-200-20 MG/5ML PO SUSP: 15.0000 mL | ORAL | Status: DC | PRN

## 2020-02-09 MED ORDER — GUAIFENESIN-DM 100-10 MG/5ML PO SYRP: 15.0000 mL | ORAL_SOLUTION | ORAL | Status: DC | PRN

## 2020-02-09 MED ORDER — ACETAMINOPHEN 325 MG PO TABS: 325.0000 mg | ORAL_TABLET | ORAL | Status: DC | PRN

## 2020-02-09 MED ORDER — FENTANYL CITRATE (PF) 100 MCG/2ML IJ SOLN
INTRAMUSCULAR | Status: AC
  Administered 2020-02-09: 100 ug via INTRAVENOUS
  Filled 2020-02-09: qty 2

## 2020-02-09 MED ORDER — HYDRALAZINE HCL 20 MG/ML IJ SOLN: 5.0000 mg | INTRAMUSCULAR | Status: DC | PRN

## 2020-02-09 MED ORDER — ORAL CARE MOUTH RINSE: 15.0000 mL | Freq: Once | OROMUCOSAL | Status: AC

## 2020-02-09 MED ORDER — MAGNESIUM SULFATE 2 GM/50ML IV SOLN: 2.0000 g | Freq: Every day | INTRAVENOUS | Status: DC | PRN

## 2020-02-09 MED ORDER — DEXAMETHASONE SODIUM PHOSPHATE 10 MG/ML IJ SOLN
INTRAMUSCULAR | Status: AC
  Filled 2020-02-09: qty 1

## 2020-02-09 MED ORDER — OXYCODONE-ACETAMINOPHEN 5-325 MG PO TABS
1.0000 | ORAL_TABLET | ORAL | Status: DC | PRN
  Administered 2020-02-09 – 2020-02-11 (×6): 2 via ORAL
  Filled 2020-02-09 (×6): qty 2

## 2020-02-09 MED ORDER — FENTANYL CITRATE (PF) 100 MCG/2ML IJ SOLN: 100.0000 ug | Freq: Once | INTRAMUSCULAR | Status: AC

## 2020-02-09 MED ORDER — LABETALOL HCL 5 MG/ML IV SOLN: 10.0000 mg | INTRAVENOUS | Status: DC | PRN

## 2020-02-09 MED ORDER — OXYCODONE HCL 5 MG PO TABS: 5.0000 mg | ORAL_TABLET | Freq: Once | ORAL | Status: DC | PRN

## 2020-02-09 MED ORDER — DOCUSATE SODIUM 100 MG PO CAPS
100.0000 mg | ORAL_CAPSULE | Freq: Every day | ORAL | Status: DC
  Administered 2020-02-10 – 2020-02-11 (×2): 100 mg via ORAL
  Filled 2020-02-09 (×3): qty 1

## 2020-02-09 MED ORDER — METOPROLOL TARTRATE 5 MG/5ML IV SOLN: 2.0000 mg | INTRAVENOUS | Status: DC | PRN

## 2020-02-09 MED ORDER — MIDAZOLAM HCL 2 MG/2ML IJ SOLN
INTRAMUSCULAR | Status: AC
  Filled 2020-02-09: qty 2

## 2020-02-09 MED ORDER — HEPARIN SODIUM (PORCINE) 1000 UNIT/ML IJ SOLN
INTRAMUSCULAR | Status: AC
  Filled 2020-02-09: qty 1

## 2020-02-09 MED ORDER — FENTANYL CITRATE (PF) 250 MCG/5ML IJ SOLN
INTRAMUSCULAR | Status: DC | PRN
  Administered 2020-02-09 (×2): 50 ug via INTRAVENOUS

## 2020-02-09 MED ORDER — AMLODIPINE BESYLATE 10 MG PO TABS
10.0000 mg | ORAL_TABLET | Freq: Every day | ORAL | Status: DC
  Administered 2020-02-10 – 2020-02-12 (×3): 10 mg via ORAL
  Filled 2020-02-09 (×3): qty 1

## 2020-02-09 MED ORDER — FENTANYL CITRATE (PF) 250 MCG/5ML IJ SOLN
INTRAMUSCULAR | Status: AC
  Filled 2020-02-09: qty 5

## 2020-02-09 MED ORDER — INSULIN GLARGINE 100 UNIT/ML ~~LOC~~ SOLN
50.0000 [IU] | Freq: Two times a day (BID) | SUBCUTANEOUS | Status: DC
  Filled 2020-02-09: qty 0.5

## 2020-02-09 MED ORDER — CHLORHEXIDINE GLUCONATE 4 % EX LIQD: 60.0000 mL | Freq: Once | CUTANEOUS | Status: DC

## 2020-02-09 MED ORDER — CHLORHEXIDINE GLUCONATE 0.12 % MT SOLN
15.0000 mL | Freq: Once | OROMUCOSAL | Status: AC
  Administered 2020-02-09: 15 mL via OROMUCOSAL
  Filled 2020-02-09: qty 15

## 2020-02-09 MED ORDER — METOPROLOL SUCCINATE ER 25 MG PO TB24
25.0000 mg | ORAL_TABLET | Freq: Every day | ORAL | Status: DC
  Administered 2020-02-10 – 2020-02-12 (×3): 25 mg via ORAL
  Filled 2020-02-09 (×3): qty 1

## 2020-02-09 MED ORDER — ROSUVASTATIN CALCIUM 20 MG PO TABS
20.0000 mg | ORAL_TABLET | Freq: Every day | ORAL | Status: DC
  Administered 2020-02-09 – 2020-02-12 (×4): 20 mg via ORAL
  Filled 2020-02-09 (×4): qty 1

## 2020-02-09 MED ORDER — INSULIN GLARGINE 100 UNIT/ML ~~LOC~~ SOLN
50.0000 [IU] | Freq: Two times a day (BID) | SUBCUTANEOUS | Status: DC
  Administered 2020-02-10 – 2020-02-12 (×5): 50 [IU] via SUBCUTANEOUS
  Filled 2020-02-09 (×6): qty 0.5

## 2020-02-09 MED ORDER — OXYCODONE HCL 5 MG/5ML PO SOLN: 5.0000 mg | Freq: Once | ORAL | Status: DC | PRN

## 2020-02-09 MED ORDER — LIDOCAINE 2% (20 MG/ML) 5 ML SYRINGE
INTRAMUSCULAR | Status: AC
  Filled 2020-02-09: qty 5

## 2020-02-09 SURGICAL SUPPLY — 34 items
BLADE 10 SAFETY STRL DISP (BLADE) ×2 IMPLANT
BLADE 11 SAFETY STRL DISP (BLADE) ×2 IMPLANT
BLADE SURG 10 STRL SS (BLADE) ×2 IMPLANT
CANISTER SUCT 3000ML PPV (MISCELLANEOUS) ×2 IMPLANT
CANISTER WOUNDNEG PRESSURE 500 (CANNISTER) ×2 IMPLANT
COVER SURGICAL LIGHT HANDLE (MISCELLANEOUS) ×2 IMPLANT
COVER WAND RF STERILE (DRAPES) IMPLANT
DRSG VAC ATS LRG SENSATRAC (GAUZE/BANDAGES/DRESSINGS) IMPLANT
DRSG VAC ATS MED SENSATRAC (GAUZE/BANDAGES/DRESSINGS) IMPLANT
DRSG VAC ATS SM SENSATRAC (GAUZE/BANDAGES/DRESSINGS) ×2 IMPLANT
ELECT CAUTERY BLADE 6.4 (BLADE) ×2 IMPLANT
ELECT REM PT RETURN 9FT ADLT (ELECTROSURGICAL) ×2
ELECTRODE REM PT RTRN 9FT ADLT (ELECTROSURGICAL) ×1 IMPLANT
GAUZE 4X4 16PLY RFD (DISPOSABLE) ×4 IMPLANT
GLOVE BIOGEL PI IND STRL 7.5 (GLOVE) ×1 IMPLANT
GLOVE BIOGEL PI INDICATOR 7.5 (GLOVE) ×1
GOWN STRL REUS W/ TWL LRG LVL3 (GOWN DISPOSABLE) ×3 IMPLANT
GOWN STRL REUS W/TWL LRG LVL3 (GOWN DISPOSABLE) ×3
HANDPIECE INTERPULSE COAX TIP (DISPOSABLE)
IV NS IRRIG 3000ML ARTHROMATIC (IV SOLUTION) IMPLANT
KIT BASIN OR (CUSTOM PROCEDURE TRAY) ×2 IMPLANT
KIT TURNOVER KIT B (KITS) ×2 IMPLANT
MANIFOLD NEPTUNE II (INSTRUMENTS) IMPLANT
NS IRRIG 1000ML POUR BTL (IV SOLUTION) ×2 IMPLANT
PACK GENERAL/GYN (CUSTOM PROCEDURE TRAY) ×2 IMPLANT
PACK UNIVERSAL I (CUSTOM PROCEDURE TRAY) IMPLANT
PAD ARMBOARD 7.5X6 YLW CONV (MISCELLANEOUS) ×4 IMPLANT
PAD NEG PRESSURE SENSATRAC (MISCELLANEOUS) ×2 IMPLANT
PENCIL BUTTON HOLSTER BLD 10FT (ELECTRODE) ×2 IMPLANT
SET HNDPC FAN SPRY TIP SCT (DISPOSABLE) IMPLANT
SYR BULB IRRIG 60ML STRL (SYRINGE) ×2 IMPLANT
TOWEL GREEN STERILE (TOWEL DISPOSABLE) ×2 IMPLANT
TUBE CONNECTING 12X1/4 (SUCTIONS) ×2 IMPLANT
WATER STERILE IRR 1000ML POUR (IV SOLUTION) ×2 IMPLANT

## 2020-02-09 NOTE — Transfer of Care (Signed)
Immediate Anesthesia Transfer of Care Note  Patient: [name]  Procedure(s) Performed: DEBRIDEMENT OF LEFT GROIN WOUND (Left Groin) APPLICATION OF WOUND VAC (Left Groin)  Patient Location: PACU  Anesthesia Type:General  Level of Consciousness: awake and alert   Airway & Oxygen Therapy: Patient Spontanous Breathing  Post-op Assessment: Report given to RN  Post vital signs: Reviewed and stable  Last Vitals:  Vitals Value Taken Time  BP 120/84 02/09/20 1653  Temp    Pulse 85 02/09/20 1654  Resp 20 02/09/20 1654  SpO2 99 % 02/09/20 1654  Vitals shown include unvalidated device data.  Last Pain:  Vitals:   02/09/20 1201  TempSrc:   PainSc: 10-Worst pain ever         Complications: No complications documented.

## 2020-02-09 NOTE — Anesthesia Preprocedure Evaluation (Signed)
Anesthesia Evaluation  Patient identified by MRN, date of birth, ID band Patient awake    Reviewed: Allergy & Precautions, H&P , NPO status , Patient's Chart, lab work & pertinent test results  Airway Mallampati: II   Neck ROM: full    Dental   Pulmonary former smoker,    breath sounds clear to auscultation       Cardiovascular hypertension, + Peripheral Vascular Disease   Rhythm:regular Rate:Normal     Neuro/Psych  Neuromuscular disease    GI/Hepatic GERD  ,  Endo/Other  diabetes, Type 2, Insulin Dependent  Renal/GU      Musculoskeletal  (+) Arthritis ,   Abdominal   Peds  Hematology  (+) Blood dyscrasia, anemia ,   Anesthesia Other Findings   Reproductive/Obstetrics                             Anesthesia Physical Anesthesia Plan  ASA: III  Anesthesia Plan: General   Post-op Pain Management:    Induction: Intravenous  PONV Risk Score and Plan: 2 and Ondansetron, Dexamethasone and Treatment may vary due to age or medical condition  Airway Management Planned: LMA  Additional Equipment:   Intra-op Plan:   Post-operative Plan: Extubation in OR  Informed Consent: I have reviewed the patients History and Physical, chart, labs and discussed the procedure including the risks, benefits and alternatives for the proposed anesthesia with the patient or authorized representative who has indicated his/her understanding and acceptance.       Plan Discussed with: CRNA, Anesthesiologist and Surgeon  Anesthesia Plan Comments:         Anesthesia Quick Evaluation

## 2020-02-09 NOTE — Anesthesia Procedure Notes (Signed)
Procedure Name: LMA Insertion Date/Time: 02/09/2020 4:18 PM Performed by: Bryson Corona, CRNA Pre-anesthesia Checklist: Patient identified, Emergency Drugs available, Suction available and Patient being monitored Patient Re-evaluated:Patient Re-evaluated prior to induction Oxygen Delivery Method: Circle System Utilized Preoxygenation: Pre-oxygenation with 100% oxygen Induction Type: IV induction LMA: LMA inserted LMA Size: 5.0 Number of attempts: 1 Placement Confirmation: positive ETCO2 Tube secured with: Tape Dental Injury: Teeth and Oropharynx as per pre-operative assessment

## 2020-02-09 NOTE — Interval H&P Note (Signed)
History and Physical Interval Note:  02/09/2020 3:31 PM  [name]  has presented today for surgery, with the diagnosis of WOUND DEHISCENCE.  The various methods of treatment have been discussed with the patient and family. After consideration of risks, benefits and other options for treatment, the patient has consented to  Procedure(s): DEBRIDEMENT OF LEFT GROIN WOUND (Left) APPLICATION OF WOUND VAC (Left) as a surgical intervention.  The patient's history has been reviewed, patient examined, no change in status, stable for surgery.  I have reviewed the patient's chart and labs.  Questions were answered to the patient's satisfaction.     [name]

## 2020-02-09 NOTE — Anesthesia Postprocedure Evaluation (Signed)
Anesthesia Post Note  Patient: [name]  Procedure(s) Performed: DEBRIDEMENT OF LEFT GROIN WOUND (Left Groin) APPLICATION OF WOUND VAC (Left Groin)     Patient location during evaluation: PACU Anesthesia Type: General Level of consciousness: awake Pain management: pain level controlled Vital Signs Assessment: post-procedure vital signs reviewed and stable Respiratory status: spontaneous breathing Cardiovascular status: stable Postop Assessment: no apparent nausea or vomiting Anesthetic complications: no   No complications documented.  Last Vitals:  Vitals:   02/09/20 1825 02/09/20 1850  BP: 120/88 120/88  Pulse: 79 79  Resp: 18 16  Temp:    SpO2: 98% 98%    Last Pain:  Vitals:   02/09/20 1821  TempSrc:   PainSc: 8                  Oluwakemi Salsberry

## 2020-02-09 NOTE — Op Note (Signed)
Procedure: Left excisional groin debridement  Preoperative diagnosis: Poorly healing left groin wound  Postoperative diagnosis: Same  Anesthesia: General  Assistant: Nurse  Operative findings: Debridement of 5 x 4 cm wound excisional debridement including subcutaneous tissue and fat no exposed vessels  Operative details: After obtaining informed consent, the patient was placed supine position operating table.  After induction general anesthesia patient's left groin is prepped and draped in usual sterile fashion.  Sharp excisional debridement was performed with a scalpel through the skin and subcutaneous tissues back to healthy-appearing tissue.  Wound was approximate 5 x 7 cm in diameter.  There was some granulation tissue.  The wound depth did not extend all the way down to the level of the femoral vessels.  The wound was thoroughly irrigated normal saline solution.  A VAC dressing was then applied and inflated to 125 mm continuous pressure.  Patient tied procedure well and there were no complications.  The incident sponge needle counts correct the end of the case.  Patient was taken the recovery room stable condition.  [name], MD [facility] [place] Office: [phone]

## 2020-02-10 ENCOUNTER — Ambulatory Visit: Payer: Medicaid Other | Admitting: Nutrition

## 2020-02-10 ENCOUNTER — Ambulatory Visit: Payer: Medicaid Other | Admitting: "Endocrinology

## 2020-02-10 ENCOUNTER — Encounter (HOSPITAL_COMMUNITY): Payer: Self-pay | Admitting: Vascular Surgery

## 2020-02-10 LAB — CBC
HCT: 27 % — ABNORMAL LOW (ref 39.0–52.0)
Hemoglobin: 8.4 g/dL — ABNORMAL LOW (ref 13.0–17.0)
MCH: 26.6 pg (ref 26.0–34.0)
MCHC: 31.1 g/dL (ref 30.0–36.0)
MCV: 85.4 fL (ref 80.0–100.0)
Platelets: 727 10*3/uL — ABNORMAL HIGH (ref 150–400)
RBC: 3.16 MIL/uL — ABNORMAL LOW (ref 4.22–5.81)
RDW: 14.9 % (ref 11.5–15.5)
WBC: 6.3 10*3/uL (ref 4.0–10.5)
nRBC: 0 % (ref 0.0–0.2)

## 2020-02-10 LAB — BASIC METABOLIC PANEL
Anion gap: 12 (ref 5–15)
BUN: 10 mg/dL (ref 8–23)
CO2: 22 mmol/L (ref 22–32)
Calcium: 9.3 mg/dL (ref 8.9–10.3)
Chloride: 99 mmol/L (ref 98–111)
Creatinine, Ser: 0.77 mg/dL (ref 0.61–1.24)
GFR, Estimated: >60 mL/min (ref >60)
Glucose, Bld: 316 mg/dL — ABNORMAL HIGH (ref 70–99)
Potassium: 4.6 mmol/L (ref 3.5–5.1)
Sodium: 133 mmol/L — ABNORMAL LOW (ref 135–145)

## 2020-02-10 LAB — GLUCOSE, CAPILLARY
Glucose-Capillary: 342 mg/dL — ABNORMAL HIGH (ref 70–99)
Glucose-Capillary: 301 mg/dL — ABNORMAL HIGH (ref 70–99)
Glucose-Capillary: 237 mg/dL — ABNORMAL HIGH (ref 70–99)
Glucose-Capillary: 211 mg/dL — ABNORMAL HIGH (ref 70–99)

## 2020-02-10 NOTE — Progress Notes (Signed)
[facility] [place]  Subjective  - no complaints   Objective 116/86 88 98.2 F (36.8 C) (Oral) 18 96%  Intake/Output Summary (Last 24 hours) at 02/10/2020 0939 Last data filed at 02/10/2020 0617 Gross per 24 hour  Intake 1540 ml  Output 1103 ml  Net 437 ml   Left groin VAC in place Left AKA healing  Assessment/Planning: Recheck groin wound tomorrow if improved d/c to SNF  [name] 02/10/2020 9:39 AM --  Laboratory Lab Results: Recent Labs    02/09/20 1251 02/10/20 0221  WBC 8.5 6.3  HGB 8.7* 8.4*  HCT 28.8* 27.0*  PLT 767* 727*   BMET Recent Labs    02/10/20 0221  NA 133*  K 4.6  CL 99  CO2 22  GLUCOSE 316*  BUN 10  CREATININE 0.77  CALCIUM 9.3    COAG Lab Results  Component Value Date   INR 1.2 01/29/2020   INR 1.3 (H) 01/09/2020   No results found for: PTT

## 2020-02-10 NOTE — Progress Notes (Signed)
  Progress Note    02/10/2020 8:08 AM 1 Day Post-Op  Subjective:  No complaints currently   Vitals:   02/10/20 0000 02/10/20 0400  BP: 107/62 111/69  Pulse:    Resp: 18 16  Temp: 98.2 F (36.8 C) 98 F (36.7 C)  SpO2: 98% 96%    Physical Exam: Cardiac:  RRR Lungs:  nonlabored Incisions:  Left groin with wound vac in plac with good seal and minimal serous output Extremities:  Left AKA incsion with intact staple line . No signs of infection   CBC    Component Value Date/Time   WBC 6.3 02/10/2020 0221   RBC 3.16 (L) 02/10/2020 0221   HGB 8.4 (L) 02/10/2020 0221   HGB 11.5 (L) 08/22/2012 1857   HCT 27.0 (L) 02/10/2020 0221   HCT 34.0 (L) 08/22/2012 1857   PLT 727 (H) 02/10/2020 0221   PLT 345 08/22/2012 1857   MCV 85.4 02/10/2020 0221   MCV 90 08/22/2012 1857   MCH 26.6 02/10/2020 0221   MCHC 31.1 02/10/2020 0221   RDW 14.9 02/10/2020 0221   RDW 16.7 (H) 08/22/2012 1857   LYMPHSABS 3.4 01/22/2020 0035   LYMPHSABS 2.1 08/05/2011 1228   MONOABS 1.0 01/22/2020 0035   MONOABS 0.9 08/05/2011 1228   EOSABS 0.1 01/22/2020 0035   EOSABS 0.0 08/05/2011 1228   BASOSABS 0.0 01/22/2020 0035   BASOSABS 0.0 08/05/2011 1228    BMET    Component Value Date/Time   NA 133 (L) 02/10/2020 0221   NA 137 10/28/2018 1411   NA 134 (L) 08/22/2012 1857   K 4.6 02/10/2020 0221   K 4.5 08/22/2012 1857   CL 99 02/10/2020 0221   CL 101 08/22/2012 1857   CO2 22 02/10/2020 0221   CO2 23 08/22/2012 1857   GLUCOSE 316 (H) 02/10/2020 0221   GLUCOSE 379 (H) 08/22/2012 1857   BUN 10 02/10/2020 0221   BUN 12 10/28/2018 1411   BUN 6 (L) 08/22/2012 1857   CREATININE 0.77 02/10/2020 0221   CREATININE 0.84 07/10/2019 0957   CALCIUM 9.3 02/10/2020 0221   CALCIUM 9.1 08/22/2012 1857   GFRNONAA >60 02/10/2020 0221   GFRNONAA 95 07/10/2019 0957   GFRAA 110 07/10/2019 0957     Intake/Output Summary (Last 24 hours) at 02/10/2020 0808 Last data filed at 02/10/2020 0617 Gross per 24 hour    Intake 1540 ml  Output 1103 ml  Net 437 ml    HOSPITAL MEDICATIONS Scheduled Meds: . amLODipine  10 mg Oral Daily  . apixaban  5 mg Oral BID  . aspirin EC  81 mg Oral Q0600  . docusate sodium  100 mg Oral Daily  . insulin aspart  0-15 Units Subcutaneous TID WC  . insulin glargine  50 Units Subcutaneous BID  . metoprolol succinate  25 mg Oral Daily  . pantoprazole  40 mg Oral Daily  . rosuvastatin  20 mg Oral Daily   Continuous Infusions: . sodium chloride 50 mL/hr at 02/09/20 2222  .  ceFAZolin (ANCEF) IV 2 g (02/10/20 0032)  . magnesium sulfate bolus IVPB     PRN Meds:.acetaminophen **OR** acetaminophen, alum & mag hydroxide-simeth, guaiFENesin-dextromethorphan, hydrALAZINE, labetalol, magnesium sulfate bolus IVPB, metoprolol tartrate, morphine injection, oxyCODONE-acetaminophen, phenol, potassium chloride  Assessment: POD 1 s/p left groin debridement. VAC in place  Afebrile. Normal WBC. Cefazolin continues.  Plan: -Continue VAC dressing. Antibiotics. -DVT prophylaxis:  On apixaban   Risa Grill, PA-C [facility] [phone] 02/10/2020  8:08 AM

## 2020-02-10 NOTE — Evaluation (Signed)
Physical Therapy Evaluation Patient Details Name: [name] MRN: [id number] DOB: Apr 14, 1960 Today's Date: 02/10/2020   History of Present Illness  Pt is a 59 y.o. male with medical history significant for hypertension, hyperlipidemia, insulin-dependent diabetes mellitus, history of left BKA, and recent admission with acute encephalopathy suspected secondary to COVID-19, presented to the ED with pain at his left BKA stump and buttock. Pt found to have acute ischemia left leg. Now s/p left iliofemoral embolectomy on 01/19/20.  Revision of BKA to AKA 10/28. 11/8 Left excisional groin debridement  Clinical Impression  PTA pt rehabbing from AKA at SNF. Pt reports he was transferring to and from his w/c. Pt is currently limited in safe mobility by L groin pain and phantom pains from recent AKA. Pt is supervision for bed mobility and min guard for lateral scooting up along EoB. Pt able to perform therex EoB prior to return to supine. PT recommending return to SNF at discharge to continue to progress his mobility. PT will continue to follow acutely.     Follow Up Recommendations SNF    Equipment Recommendations  None recommended by PT       Precautions / Restrictions Precautions Precautions: Fall Precaution Comments: groin wound vac Restrictions Weight Bearing Restrictions: Yes LLE Weight Bearing: Non weight bearing Other Position/Activity Restrictions: L AKA      Mobility  Bed Mobility Overal bed mobility: Needs Assistance Bed Mobility: Supine to Sit Rolling: Supervision   Supine to sit: Supervision Sit to supine: Supervision   General bed mobility comments: HOB and using rail coming out on his right side, able to manage LE back into bed     Transfers Overall transfer level: Needs assistance Equipment used: None Transfers: Lateral/Scoot Transfers          Lateral/Scoot Transfers: Min guard General transfer comment: declined OOB as he just returned from sitting up.  min guard for scooting 2 feet toward HoB          Balance Overall balance assessment: Needs assistance Sitting-balance support: No upper extremity supported (RLE supported) Sitting balance-Leahy Scale: Fair Sitting balance - Comments: seated at EOB                                     Pertinent Vitals/Pain Pain Assessment: 0-10 Pain Score: 10-Worst pain ever Pain Location: LLE (groin and phantom pain) Pain Descriptors / Indicators: Aching;Sore Pain Intervention(s): Limited activity within patient's tolerance;Monitored during session;Repositioned    Home Living Family/patient expects to be discharged to:: Skilled nursing facility                      Prior Function Level of Independence: Needs assistance   Gait / Transfers Assistance Needed: reports pivot transfer to wc           Hand Dominance   Dominant Hand: Right    Extremity/Trunk Assessment   Upper Extremity Assessment Upper Extremity Assessment: Defer to OT evaluation    Lower Extremity Assessment Lower Extremity Assessment: LLE deficits/detail LLE Deficits / Details: L AKA, painful movement due to L groin I&D    Cervical / Trunk Assessment Cervical / Trunk Assessment: Normal  Communication   Communication: No difficulties  Cognition Arousal/Alertness: Awake/alert Behavior During Therapy: WFL for tasks assessed/performed Overall Cognitive Status: Within Functional Limits for tasks assessed  General Comments: decreased awareness of progression of his disease      General Comments General comments (skin integrity, edema, etc.): VSS on RA, wound vac in place     Exercises General Exercises - Lower Extremity Ankle Circles/Pumps: Seated;Right;10 reps Long Arc Quad: Seated;Right;10 reps Hip Flexion/Marching: Seated;10 reps;Right Amputee Exercises Hip Flexion/Marching: AROM;Left;10 reps   Assessment/Plan    PT Assessment Patient  needs continued PT services  PT Problem List Decreased strength;Decreased activity tolerance;Decreased balance;Decreased mobility;Cardiopulmonary status limiting activity;Pain       PT Treatment Interventions DME instruction;Functional mobility training;Therapeutic activities;Therapeutic exercise;Balance training;Cognitive remediation;Patient/family education    PT Goals (Current goals can be found in the Care Plan section)  Acute Rehab PT Goals Patient Stated Goal: to walk again PT Goal Formulation: With patient Time For Goal Achievement: 02/03/20 Potential to Achieve Goals: Good    Frequency Min 2X/week    AM-PAC PT "6 Clicks" Mobility  Outcome Measure Help needed turning from your back to your side while in a flat bed without using bedrails?: None Help needed moving from lying on your back to sitting on the side of a flat bed without using bedrails?: None Help needed moving to and from a bed to a chair (including a wheelchair)?: A Lot Help needed standing up from a chair using your arms (e.g., wheelchair or bedside chair)?: A Lot Help needed to walk in hospital room?: Total Help needed climbing 3-5 steps with a railing? : Total 6 Click Score: 14    End of Session Equipment Utilized During Treatment: Gait belt Activity Tolerance: Patient limited by fatigue Patient left: in bed;with call bell/phone within reach Nurse Communication: Mobility status PT Visit Diagnosis: Unsteadiness on feet (R26.81);Other abnormalities of gait and mobility (R26.89);Muscle weakness (generalized) (M62.81);History of falling (Z91.81);Pain Pain - part of body:  (L groin )    Time: 1610-9604 PT Time Calculation (min) (ACUTE ONLY): 16 min   Charges:   PT Evaluation $PT Eval Moderate Complexity: 1 Mod          Kalijah Westfall B. Migdalia Dk PT, DPT Acute Rehabilitation Services Pager [phone] Office [phone]   [place] 02/10/2020, 2:22 PM

## 2020-02-10 NOTE — TOC Initial Note (Addendum)
Transition of Care [facility]) - Initial/Assessment Note    Patient Details  Name: [name] MRN: [id number] Date of Birth: 08-23-1960  Transition of Care [facility]) CM/SW Contact:    Vinie Sill, [place] Phone Number: 02/10/2020, 4:48 PM  Clinical Narrative:                  CSW met with patient at bedside. CSW introduced self and explained role. Patient confirmed he is [place]. Patient is agreeable to returning to [facility] once medially stable. Patient states no questions or concerns at this time.  Thurmond Butts, MSW, [place] Clinical Social Worker   Expected Discharge Plan: Skilled Nursing Facility Barriers to Discharge: Continued Medical Work up   Patient Goals and CMS Choice        Expected Discharge Plan and Services Expected Discharge Plan: [place] In-house Referral: Clinical Social Work     Living arrangements for the past 2 months: Single Family Home                                      Prior Living Arrangements/Services Living arrangements for the past 2 months: Single Family Home Lives with:: Self Patient language and need for interpreter reviewed:: No        Need for Family Participation in Patient Care: Yes (Comment) Care giver support system in place?: Yes (comment)   Criminal Activity/Legal Involvement Pertinent to Current Situation/Hospitalization: No - Comment as needed  Activities of Daily Living Home Assistive Devices/Equipment: Prosthesis ADL Screening (condition at time of admission) Patient's cognitive ability adequate to safely complete daily activities?: Yes Is the patient deaf or have difficulty hearing?: No Does the patient have difficulty seeing, even when wearing glasses/contacts?: No Does the patient have difficulty concentrating, remembering, or making decisions?: No Patient able to express need for assistance with ADLs?: Yes Does the patient have difficulty dressing or bathing?:  No Independently performs ADLs?: Yes (appropriate for developmental age) Does the patient have difficulty walking or climbing stairs?: Yes Weakness of Legs: Both Weakness of Arms/Hands: None  Permission Sought/Granted Permission sought to share information with : Family Supports Permission granted to share information with : Yes, Verbal Permission Granted  Share Information with NAME: [name]     Permission granted to share info w Relationship: son  Permission granted to share info w Contact Information: [phone]  Emotional Assessment Appearance:: Appears stated age Attitude/Demeanor/Rapport: Engaged Affect (typically observed): Accepting, Appropriate Orientation: : Oriented to Self, Oriented to Place, Oriented to  Time, Oriented to Situation Alcohol / Substance Use: Not Applicable Psych Involvement: No (comment)  Admission diagnosis:  Non-healing surgical wound [T81.89XA] Patient Active Problem List   Diagnosis Date Noted  . Non-healing surgical wound 02/09/2020  . Pressure injury of skin 02/01/2020  . Lower limb ischemia 01/20/2020  . Ischemic leg 01/19/2020  . Altered mental status 01/09/2020  . Hyperkalemia 01/09/2020  . Transaminitis 01/09/2020  . Dehydration 01/09/2020  . Leukocytosis 01/09/2020  . GERD (gastroesophageal reflux disease) 01/09/2020  . Recurrent falls 01/09/2020  . Obesity (BMI 30-39.9) 01/09/2020  . Prolonged QT interval 01/09/2020  . Hyperglycemia due to diabetes mellitus ([place]) 01/09/2020  . Rhabdomyolysis 01/08/2020  . Encounter for screening colonoscopy 05/27/2018  . Vitamin D deficiency 02/06/2018  . Mixed hyperlipidemia 12/12/2017  . S/P BKA (below knee amputation) unilateral, left ([place]) 11/29/2017  . Unilateral complete BKA, right, sequela ([place])   .  Thrombocytosis   . Constipation due to pain medication   . Below knee amputation status, left   . Unilateral complete BKA, left, initial encounter ([place])   . Acute blood loss  anemia   . Post-operative pain   . Essential hypertension, benign   . Tobacco abuse   . S/P amputation   . Subacute osteomyelitis, left ankle and foot ([place])   . Acute osteomyelitis of left calcaneus (HCC)   . Cutaneous abscess of left foot   . Severe protein-calorie malnutrition ([place])   . PAD (peripheral artery disease) ([place])   . Wound infection 10/02/2017  . Type 2 diabetes mellitus treated with insulin ([place])   . Tobacco use disorder   . Lymphedema 05/28/2017  . Essential hypertension 05/28/2017  . DM type 2 causing vascular disease ([place]) 05/28/2017  . DJD (degenerative joint disease) 05/28/2017  . Cellulitis of lower extremity 01/16/2017  . Left leg pain 01/16/2017  . Hyponatremia 04/05/2016  . Normocytic anemia 04/05/2016  . Gout 04/05/2016  . Bilateral foot pain 04/04/2016   PCP:  Vidal Schwalbe, MD Pharmacy:   [place], [place] - [address] [place] [address] Phone: [phone] Fax: [phone]     Social Determinants of Health (SDOH) Interventions    Readmission Risk Interventions No flowsheet data found.

## 2020-02-10 NOTE — Progress Notes (Signed)
Inpatient Diabetes Program Recommendations  AACE/ADA: New Consensus Statement on Inpatient Glycemic Control   Target Ranges:  Prepandial:   less than 140 mg/dL      Peak postprandial:   less than 180 mg/dL (1-2 hours)      Critically ill patients:  140 - 180 mg/dL   Results for IMRAN, NUON (MRN [id number]) as of 02/10/2020 11:57  Ref. Range 02/09/2020 11:18 02/09/2020 13:37 02/09/2020 16:53 02/09/2020 22:03 02/10/2020 06:11  Glucose-Capillary Latest Ref Range: 70 - 99 mg/dL 126 (H) 110 (H) 98 342 (H) 301 (H)   Review of Glycemic Control  Diabetes history: DM2 Outpatient Diabetes medications: Lantus 50 units BID, Novolog 24-30 units TID with meals, Glipizide XL 10 mg daily Current orders for Inpatient glycemic control: Lantus 50 units BID, Novolog 0-15 units TID with meals  Inpatient Diabetes Program Recommendations:     Insulin: Please consider decreasing Lantus to 50 units daily (already given today at 8:37 am). Also, please consider ordering Novolog 0-5 units QHS for bedtime correction.  NOTE: In reviewing chart, noted patient did not receive any Lantus on 02/09/20. Patient received Decadron 4 mg on 02/09/20 at 16:20 which is contributing to hyperglycemia. Fasting glucose 301 mg/dl today and patient has already been given Lantus 50 units today. Recommend decreasing Lantus to 50 units daily.   Thanks, Barnie Alderman, RN, MSN, CDE Diabetes Coordinator Inpatient Diabetes Program [phone] (Team Pager from 8am to 5pm)

## 2020-02-10 NOTE — Evaluation (Signed)
Occupational Therapy Evaluation Patient Details Name: [name] MRN: [id number] DOB: Aug 05, 1960 Today's Date: 02/10/2020    History of Present Illness Pt is a 59 y.o. male with medical history significant for hypertension, hyperlipidemia, insulin-dependent diabetes mellitus, history of left BKA, and recent admission with acute encephalopathy suspected secondary to COVID-19, presented to the ED with pain at his left BKA stump and buttock. Pt found to have acute ischemia left leg. Now s/p left iliofemoral embolectomy on 01/19/20.  Revision of BKA to AKA 10/28. 11/8 Left excisional groin debridement   Clinical Impression   This 59 yo male admitted and underwent above presents to acute OT with needing A pta due to recent other surgeries per above as well. He currently is setup/S- Max A for basic ADLs and able to do a lateral scoot to drop arm recliner with min guard A to his strong side. He will continue to benefit from acute OT with follow up at SNF.     Follow Up Recommendations  SNF;Supervision/Assistance - 24 hour    Equipment Recommendations  Other (comment) (TBD next venue)       Precautions / Restrictions Precautions Precautions: Fall Precaution Comments: groin wound vac Restrictions Weight Bearing Restrictions: Yes LLE Weight Bearing: Non weight bearing      Mobility Bed Mobility Overal bed mobility: Needs Assistance Bed Mobility: Supine to Sit Rolling: Supervision         General bed mobility comments: HOB and using rail coming out on his right side    Transfers Overall transfer level: Needs assistance Equipment used: None Transfers: Lateral/Scoot Transfers          Lateral/Scoot Transfers: Min guard      Balance Overall balance assessment: Needs assistance Sitting-balance support: No upper extremity supported (RLE supported) Sitting balance-Leahy Scale: Fair Sitting balance - Comments: seated at EOB                                    ADL either performed or assessed with clinical judgement   ADL Overall ADL's : Needs assistance/impaired Eating/Feeding: Independent;Sitting Eating/Feeding Details (indicate cue type and reason): in recliner Grooming: Set up;Sitting Grooming Details (indicate cue type and reason): in recliner Upper Body Bathing: Set up;Sitting Upper Body Bathing Details (indicate cue type and reason): in recliner Lower Body Bathing: Maximal assistance;Bed level   Upper Body Dressing : Set up;Sitting Upper Body Dressing Details (indicate cue type and reason): in recliner Lower Body Dressing: Maximal assistance;Bed level   Toilet Transfer: Min Psychiatric nurse Details (indicate cue type and reason): lateral transfer to his right from bed to drop arm recliner Toileting- Clothing Manipulation and Hygiene: Maximal assistance;Bed level;Sitting/lateral lean               Vision Patient Visual Report: No change from baseline              Pertinent Vitals/Pain Pain Assessment: Faces Pain Score: 5  Pain Location: LLE (groin and phantom pain) Pain Descriptors / Indicators: Aching;Sore Pain Intervention(s): Limited activity within patient's tolerance;Monitored during session;Repositioned     Hand Dominance  Right   Extremity/Trunk Assessment Upper Extremity Assessment Upper Extremity Assessment: Overall WFL for tasks assessed           Communication  No issues   Cognition Arousal/Alertness: Awake/alert Behavior During Therapy: WFL for tasks assessed/performed Overall Cognitive Status: Within Functional Limits for tasks assessed  Home Living Family/patient expects to be discharged to:: Skilled nursing facility                                                 OT Problem List: Decreased strength;Impaired balance (sitting and/or standing);Pain;Obesity      OT Treatment/Interventions:  Self-care/ADL training;DME and/or AE instruction;Patient/family education;Balance training;Therapeutic exercise    OT Goals(Current goals can be found in the care plan section) Acute Rehab OT Goals Patient Stated Goal: to walk again OT Goal Formulation: With patient Time For Goal Achievement: 02/24/20 Potential to Achieve Goals: Good  OT Frequency: Min 2X/week   Barriers to D/C: Decreased caregiver support             AM-PAC OT "6 Clicks" Daily Activity     Outcome Measure Help from another person eating meals?: None Help from another person taking care of personal grooming?: A Little Help from another person toileting, which includes using toliet, bedpan, or urinal?: A Lot Help from another person bathing (including washing, rinsing, drying)?: A Lot Help from another person to put on and taking off regular upper body clothing?: A Little Help from another person to put on and taking off regular lower body clothing?: A Lot 6 Click Score: 16   End of Session Nurse Communication: Mobility status  Activity Tolerance: Patient tolerated treatment well Patient left: in chair;with call bell/phone within reach;with chair alarm set  OT Visit Diagnosis: Other abnormalities of gait and mobility (R26.89);Muscle weakness (generalized) (M62.81);Pain Pain - Right/Left: Left Pain - part of body: Leg (groin)                Time: 3094-0768 OT Time Calculation (min): 27 min Charges:  OT General Charges $OT Visit: 1 Visit OT Evaluation $OT Eval Moderate Complexity: 1 Mod OT Treatments $Self Care/Home Management : 8-22 mins  [place], OTR/L Acute [employer] Pager [phone] Office [phone]     [name] 02/10/2020, 12:26 PM

## 2020-02-11 LAB — CBC
HCT: 25.7 % — ABNORMAL LOW (ref 39.0–52.0)
Hemoglobin: 8 g/dL — ABNORMAL LOW (ref 13.0–17.0)
MCH: 26.7 pg (ref 26.0–34.0)
MCHC: 31.1 g/dL (ref 30.0–36.0)
MCV: 85.7 fL (ref 80.0–100.0)
Platelets: 628 10*3/uL — ABNORMAL HIGH (ref 150–400)
RBC: 3 MIL/uL — ABNORMAL LOW (ref 4.22–5.81)
RDW: 15.1 % (ref 11.5–15.5)
WBC: 10.6 10*3/uL — ABNORMAL HIGH (ref 4.0–10.5)
nRBC: 0 % (ref 0.0–0.2)

## 2020-02-11 LAB — GLUCOSE, CAPILLARY
Glucose-Capillary: 280 mg/dL — ABNORMAL HIGH (ref 70–99)
Glucose-Capillary: 209 mg/dL — ABNORMAL HIGH (ref 70–99)
Glucose-Capillary: 204 mg/dL — ABNORMAL HIGH (ref 70–99)
Glucose-Capillary: 190 mg/dL — ABNORMAL HIGH (ref 70–99)
Glucose-Capillary: 251 mg/dL — ABNORMAL HIGH (ref 70–99)

## 2020-02-11 LAB — BASIC METABOLIC PANEL
Anion gap: 12 (ref 5–15)
BUN: 11 mg/dL (ref 8–23)
CO2: 23 mmol/L (ref 22–32)
Calcium: 8.9 mg/dL (ref 8.9–10.3)
Chloride: 102 mmol/L (ref 98–111)
Creatinine, Ser: 0.64 mg/dL (ref 0.61–1.24)
GFR, Estimated: >60 mL/min (ref >60)
Glucose, Bld: 259 mg/dL — ABNORMAL HIGH (ref 70–99)
Potassium: 4.4 mmol/L (ref 3.5–5.1)
Sodium: 137 mmol/L (ref 135–145)

## 2020-02-11 LAB — SARS CORONAVIRUS 2 BY RT PCR (HOSPITAL ORDER, PERFORMED IN ~~LOC~~ HOSPITAL LAB): SARS Coronavirus 2: NEGATIVE

## 2020-02-11 NOTE — Consult Note (Signed)
[facility] Nurse Consult Note: Patient receiving care [phone] Reason for Consult: VAC dressing to left groin wound Wound type: surgical site to left groin/inguinal area Pressure Injury POA: Yes/No/NA Measurement: 3 cm x 6 cm x 1.8 cm Wound bed: red, oozing blood Drainage (amount, consistency, odor) sanginous Periwound: area extending towards the scrotum was filled with a barrier ring to facilitate getting a seal. Dressing procedure/placement/frequency: gauze dressing removed. One piece of black foam placed into wound bed. Drape applied. Immediate seal obtained at 100 mm Hg per order. Patient tolerated well.  Val Riles, RN, MSN, CWOCN, CNS-BC, pager [phone]

## 2020-02-11 NOTE — TOC Progression Note (Signed)
Transition of Care [facility]) - Progression Note    Patient Details  Name: [name] MRN: [id number] Date of Birth: [birth year]  Transition of Care [facility]) CM/SW [place], [place] Phone Number: 02/11/2020, 2:05 PM  Clinical Narrative:     Patient is from [facility]- they have been informed of patient needing wound VAC- expected to d/c tomorrow  Thurmond Butts, MSW, [place] Worker   Expected Discharge Plan: Skilled Nursing Facility Barriers to Discharge: Continued Medical Work up  Expected Discharge Plan and Services Expected Discharge Plan: [place] In-house Referral: Clinical Social Work     Living arrangements for the past 2 months: Single Family Home                                       Social Determinants of Health (SDOH) Interventions    Readmission Risk Interventions No flowsheet data found.

## 2020-02-11 NOTE — Progress Notes (Addendum)
Progress Note    02/11/2020 7:13 AM 2 Days Post-Op  Subjective:  Denies pain    Vitals:   02/11/20 0515 02/11/20 0522  BP: (!) 96/50 115/68  Pulse: 67 69  Resp: 18 18  Temp: 98.7 F (37.1 C)   SpO2: 98% 99%    Physical Exam: Cardiac:  RRR Lungs:  nonlabored Incisions:  Left groin with clean wound bed Extremities:  Left AKA incsion with intact staple line . No signs of infection  CBC    Component Value Date/Time   WBC 10.6 (H) 02/11/2020 0120   RBC 3.00 (L) 02/11/2020 0120   HGB 8.0 (L) 02/11/2020 0120   HGB 11.5 (L) 08/22/2012 1857   HCT 25.7 (L) 02/11/2020 0120   HCT 34.0 (L) 08/22/2012 1857   PLT 628 (H) 02/11/2020 0120   PLT 345 08/22/2012 1857   MCV 85.7 02/11/2020 0120   MCV 90 08/22/2012 1857   MCH 26.7 02/11/2020 0120   MCHC 31.1 02/11/2020 0120   RDW 15.1 02/11/2020 0120   RDW 16.7 (H) 08/22/2012 1857   LYMPHSABS 3.4 01/22/2020 0035   LYMPHSABS 2.1 08/05/2011 1228   MONOABS 1.0 01/22/2020 0035   MONOABS 0.9 08/05/2011 1228   EOSABS 0.1 01/22/2020 0035   EOSABS 0.0 08/05/2011 1228   BASOSABS 0.0 01/22/2020 0035   BASOSABS 0.0 08/05/2011 1228    BMET    Component Value Date/Time   NA 137 02/11/2020 0120   NA 137 10/28/2018 1411   NA 134 (L) 08/22/2012 1857   K 4.4 02/11/2020 0120   K 4.5 08/22/2012 1857   CL 102 02/11/2020 0120   CL 101 08/22/2012 1857   CO2 23 02/11/2020 0120   CO2 23 08/22/2012 1857   GLUCOSE 259 (H) 02/11/2020 0120   GLUCOSE 379 (H) 08/22/2012 1857   BUN 11 02/11/2020 0120   BUN 12 10/28/2018 1411   BUN 6 (L) 08/22/2012 1857   CREATININE 0.64 02/11/2020 0120   CREATININE 0.84 07/10/2019 0957   CALCIUM 8.9 02/11/2020 0120   CALCIUM 9.1 08/22/2012 1857   GFRNONAA >60 02/11/2020 0120   GFRNONAA 95 07/10/2019 0957   GFRAA 110 07/10/2019 0957     Intake/Output Summary (Last 24 hours) at 02/11/2020 0713 Last data filed at 02/10/2020 2147 Gross per 24 hour  Intake 260 ml  Output 2850 ml  Net -2590 ml     HOSPITAL MEDICATIONS Scheduled Meds: . amLODipine  10 mg Oral Daily  . apixaban  5 mg Oral BID  . aspirin EC  81 mg Oral Q0600  . docusate sodium  100 mg Oral Daily  . insulin aspart  0-15 Units Subcutaneous TID WC  . insulin glargine  50 Units Subcutaneous BID  . metoprolol succinate  25 mg Oral Daily  . pantoprazole  40 mg Oral Daily  . rosuvastatin  20 mg Oral Daily   Continuous Infusions: . magnesium sulfate bolus IVPB     PRN Meds:.acetaminophen **OR** acetaminophen, alum & mag hydroxide-simeth, guaiFENesin-dextromethorphan, hydrALAZINE, labetalol, magnesium sulfate bolus IVPB, metoprolol tartrate, morphine injection, oxyCODONE-acetaminophen, phenol, potassium chloride  Assessment: POD 2 s/p left groin debridement. WBC 10.6 today. Remains afebrile. Wound clean.     Plan: -Replace VAC. Back to nursing facility. -DVT prophylaxis:  On apixiban   Risa Grill, PA-C [facility] [phone] 02/11/2020  7:13 AM   D/c to SNF today Follow up 2-3 weeks for left AKA staple removal and recheck wound VAC at SNF MWF schedule  [name], MD [facility] [place] Office: [phone]

## 2020-02-11 NOTE — Care Plan (Addendum)
Patient has follow-up appointment arranged for 02/19/2020 at 3 pm with Dr. Oneida Alar per appointment tab.  Accepting facility will take patient with wound VAC tomorrow, 11/11.

## 2020-02-12 LAB — GLUCOSE, CAPILLARY
Glucose-Capillary: 92 mg/dL (ref 70–99)
Glucose-Capillary: 141 mg/dL — ABNORMAL HIGH (ref 70–99)

## 2020-02-12 MED ORDER — OXYCODONE-ACETAMINOPHEN 5-325 MG PO TABS: 1.0000 | ORAL_TABLET | ORAL | 0 refills | Status: DC | PRN

## 2020-02-12 NOTE — Progress Notes (Signed)
AVS placed on patient chart for receiving facility. Iv removed, clean and intact. Pt escorted by [facility] to [facility]. Patient wheelchair unable to go with PTAR, family called to come pick it up. No answer. Will continue trying.  Arletta Bale, RN

## 2020-02-12 NOTE — Progress Notes (Signed)
Attempted to call report to receiving facility x2. No answer. Will try again later.  Arletta Bale, RN

## 2020-02-12 NOTE — Discharge Instructions (Signed)
Leg Amputation, Care After This sheet gives you information about how to care for yourself after your procedure. Your health care provider may also give you more specific instructions. If you have problems or questions, contact your health care provider. What can I expect after the procedure? After the procedure, it is common to have:  A little blood or fluid coming from your incision.  Pain from your incision.  Pain that feels like it is coming from the leg that has been removed (phantom pain). This can last for a year or longer.  Skin breakdown on your stump (residual limb).  Feelings of depression, anxiety, and fear. Follow these instructions at home: Medicines  Take over-the-counter and prescription medicines only as told by your health care provider.  If you were prescribed an antibiotic medicine, take it as told by your health care provider. Do not stop taking the antibiotic even if you start to feel better. Bathing  Do not take baths, swim, use a hot tub, or get your residual limb wet until your health care provider approves. You may only be allowed to take sponge baths.  Ask your health care provider when you may start taking showers. After taking a shower, make sure to rinse and dry your residual limb carefully. Incision care   Check your residual limb, especially your incision area, every day. Check for: ? More redness, swelling, or pain. ? More fluid or blood. ? Warmth. ? Pus or a bad smell. ? Blisters. ? Scrapes.  Follow instructions from your health care provider about how to take care of your incision. Make sure you: ? Wash your hands with soap and water before you change your bandage (dressing). If soap and water are not available, use hand sanitizer. ? Change your dressing as told by your health care provider. ? Leave stitches (sutures), skin glue, or adhesive strips in place. These skin closures may need to stay in place for 2 weeks or longer. If adhesive strip  edges start to loosen and curl up, you may trim the loose edges. Do not remove adhesive strips completely unless your health care provider tells you to do that. Activity  Return to your normal activities as told by your health care provider. Ask your health care provider what activities are safe for you.  Do physical therapy exercises as told by your health care provider.  If you have been fitted with an artificial leg (prosthesis) or have been given crutches, use them as told by your health care provider. Eating and drinking  Eat a healthy diet that includes whole grains, fruits and vegetables, low-fat dairy products, and lean proteins.  Drink enough fluid to keep your urine pale yellow. Driving  Work with an occupational therapist to learn new strategies for safe driving with an amputation.  Do not drive or use heavy equipment while taking prescription pain medicine. General instructions  To prevent or treat constipation while you are taking prescription pain medicine, your health care provider may recommend that you: ? Drink enough fluid to keep your urine pale yellow. ? Take over-the-counter or prescription medicines. ? Eat foods that are high in fiber, such as fresh fruits and vegetables, whole grains, and beans. ? Limit foods that are high in fat and processed sugars, such as fried and sweet foods.  Do not use oils, lotion, cream, or rubbing alcohol on the remaining part of your leg.  Wear compression stockings as told by your health care provider.  If you have trouble coping   with your amputation, contact your health care provider. Some feelings of depression, anxiety, or fear are normal after an amputation, but if you struggle with these feelings or if they get overwhelming, your provider may be able to recommend a therapist or support group to help you.  Do not use any products that contain nicotine or tobacco, such as cigarettes and e-cigarettes. These can delay bone healing.  If you need help quitting, ask your health care provider.  Keep all follow-up visits as told by your health care provider. This is important. Contact a health care provider if:  You have a fever.  You have more tenderness in your residual limb.  You have a rash or itchy skin.  You have a cough or chills and you feel achy and weak.  You have trouble coping with your amputation.  You have blisters or scrapes on your residual limb. Get help right away if:  You have severe pain in your residual limb.  You have more redness, swelling, or pain around your incision.  You have more fluid or blood coming from your incision.  Your incision feels warm to the touch, tender, and painful.  You have pus or a bad smell coming from your incision.  You feel light-headed and have shortness of breath.  You have blood-soaked bandages.  You cough up blood.  You have chest pain or pain when taking a deep breath or coughing. If you have these symptoms, do not drive yourself to the hospital. Call emergency services right away. If you ever feel like you may hurt yourself or others, or have thoughts about taking your own life, get help right away. You can go to your nearest emergency department or call:  Your local emergency services (911 in the [place]  A suicide crisis helpline, such as the National Suicide Prevention Lifeline at 1-800-273-8255. This is open 24 hours a day. Summary  After a leg amputation, you may have pain that feels like it is coming from the leg that was removed (phantom pain). This can last for a year or longer.  Follow instructions from your health care provider about how to take care of your incision.  Check your residual limb, especially your incision area, every day. More redness, swelling, or pain may be a sign of infection.  Contact your health care provider if you have trouble coping with your amputation. This information is not intended to replace advice given to  you by your health care provider. Make sure you discuss any questions you have with your health care provider. Document Revised: 06/28/2016 Document Reviewed: 06/28/2016 Elsevier Patient Education  2020 [employer]  

## 2020-02-12 NOTE — Progress Notes (Addendum)
  Progress Note    02/12/2020 7:35 AM 3 Days Post-Op  Subjective: no complaints   Vitals:   02/11/20 2347 02/12/20 0353  BP: 128/73 123/72  Pulse: 77 78  Resp: 16 18  Temp: 98.3 F (36.8 C) 98.3 F (36.8 C)  SpO2: 98% 99%   Physical Exam: Cardiac: regular Lungs: non labored Incisions: left groin with Wound VAC - good seal and minimal output Extremities: left AKA staples intact. No signs of infect. No fluid collections Abdomen: obese, soft Neurologic: alert and oriented  CBC    Component Value Date/Time   WBC 10.6 (H) 02/11/2020 0120   RBC 3.00 (L) 02/11/2020 0120   HGB 8.0 (L) 02/11/2020 0120   HGB 11.5 (L) 08/22/2012 1857   HCT 25.7 (L) 02/11/2020 0120   HCT 34.0 (L) 08/22/2012 1857   PLT 628 (H) 02/11/2020 0120   PLT 345 08/22/2012 1857   MCV 85.7 02/11/2020 0120   MCV 90 08/22/2012 1857   MCH 26.7 02/11/2020 0120   MCHC 31.1 02/11/2020 0120   RDW 15.1 02/11/2020 0120   RDW 16.7 (H) 08/22/2012 1857   LYMPHSABS 3.4 01/22/2020 0035   LYMPHSABS 2.1 08/05/2011 1228   MONOABS 1.0 01/22/2020 0035   MONOABS 0.9 08/05/2011 1228   EOSABS 0.1 01/22/2020 0035   EOSABS 0.0 08/05/2011 1228   BASOSABS 0.0 01/22/2020 0035   BASOSABS 0.0 08/05/2011 1228    BMET    Component Value Date/Time   NA 137 02/11/2020 0120   NA 137 10/28/2018 1411   NA 134 (L) 08/22/2012 1857   K 4.4 02/11/2020 0120   K 4.5 08/22/2012 1857   CL 102 02/11/2020 0120   CL 101 08/22/2012 1857   CO2 23 02/11/2020 0120   CO2 23 08/22/2012 1857   GLUCOSE 259 (H) 02/11/2020 0120   GLUCOSE 379 (H) 08/22/2012 1857   BUN 11 02/11/2020 0120   BUN 12 10/28/2018 1411   BUN 6 (L) 08/22/2012 1857   CREATININE 0.64 02/11/2020 0120   CREATININE 0.84 07/10/2019 0957   CALCIUM 8.9 02/11/2020 0120   CALCIUM 9.1 08/22/2012 1857   GFRNONAA >60 02/11/2020 0120   GFRNONAA 95 07/10/2019 0957   GFRAA 110 07/10/2019 0957    INR    Component Value Date/Time   INR 1.2 01/29/2020 0113      Intake/Output Summary (Last 24 hours) at 02/12/2020 0735 Last data filed at 02/12/2020 0555 Gross per 24 hour  Intake --  Output 1350 ml  Net -1350 ml     Assessment/Plan:  59 y.o. male is s/pleft excisional groin debridment 3 Days Post-Op. Wound VAC has been arranged. To be changed MWF at [facility]. Stable for discharge to SNF today. Has follow up arranged for 02/19/20 with Dr. Jeralene Peters, PA-C [facility] [phone] 02/12/2020 7:35 AM   D/c to SNF with [facility]  [name], MD [facility] [place] Office: [phone]

## 2020-02-12 NOTE — Discharge Summary (Signed)
[facility] Discharge Summary   Patient ID:  [name] MRN: [id number] DOB/AGE: 1960-12-18 59 y.o.  Admit date: 02/09/2020 Discharge date: 02/12/2020 Date of Surgery: 02/09/2020 Surgeon: Surgeon(s): Elam Dutch, MD  Admission Diagnosis: Non-healing surgical wound [T81.89XA]  Discharge Diagnoses:  Non-healing surgical wound [T81.89XA]  Secondary Diagnoses: Past Medical History:  Diagnosis Date  . COVID-19    October, 2021  . Diabetes mellitus without complication ([place])   . GERD (gastroesophageal reflux disease)   . Gout   . Hyperlipidemia   . Hypertension   . Insomnia   . Osteoarthritis     Procedure(s): DEBRIDEMENT OF LEFT GROIN WOUND APPLICATION OF WOUND VAC  Discharged Condition: fair   HPI:  This is a 59 y.o. male who is s/p left iliofemoral embolectomy on 01/19/2020 by Dr. Oneida Alar.    He initially presented with 36-48 hrs of coolness and pain in the left BKA stump.  He had his amputation in 2019 by [name] and had chronic pain since then but worse recently.  Pt uses prosthetic for transfers.   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   He was felt that his acute ischemia in the left BKA was most likely secondary to hypercoagulable state from covid.  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 Trula Slade.  His wound cultures showed GPC with Serratia marcescens and Enterococcus faecalis and he was started on Keflex & then switched to oral amoxicillin and Bactrim DS.   He was discharged from the hospital on 10/31 & at that time, his AKA was healing nicely.  He had some slight separation in the left groin that would need meticulous wound care.  He does not have any prosthetic material in the left groin as he had an arteriotomy with primary closure.  He had been started on Eliquis.  He was discharged to SNF.  Pt returns today for follow up.  MD from wound care  center called to report pt's entire groin wound was necrotic with heavy drainage of serosanguinous fluid and the pt's post operative appointment was moved up to today for wound check.    He is still in the SNF.  He is not a good historian.  He states that he has been working with PT but not transferring yet.  He denies any fever or chills.  Otherwise, he really can't tell me anything about his incisions since he was discharged.  He remains on Amoxicillin and Bactrim with end date for 02/07/2020.  Will plan for debridement of left groin with wound vac placement in OR on 02/09/2020.  He will need to be off of his Eliquis starting 02/06/2020 for surgery on 11/8.    Hospital Course:  [name] is a 59 y.o. male is S/P Left Procedure(s): DEBRIDEMENT OF LEFT GROIN WOUND APPLICATION OF WOUND VAC Extubated: POD # 0 Post-op wounds healing well Pt. Ambulating, voiding and taking PO diet without difficulty. Pt pain controlled with PO pain meds. Labs as below Complications:none  Patient did well post operatively and is stable for discharge to SNF with left groin Wound VAC. He has follow up arranged on 02/19/20 with Dr. Oneida Alar  Consults: wound care   Significant Diagnostic Studies: CBC Lab Results  Component Value Date   WBC 10.6 (H) 02/11/2020   HGB 8.0 (L) 02/11/2020   HCT 25.7 (L) 02/11/2020   MCV 85.7 02/11/2020   PLT 628 (H)  02/11/2020    BMET    Component Value Date/Time   NA 137 02/11/2020 0120   NA 137 10/28/2018 1411   NA 134 (L) 08/22/2012 1857   K 4.4 02/11/2020 0120   K 4.5 08/22/2012 1857   CL 102 02/11/2020 0120   CL 101 08/22/2012 1857   CO2 23 02/11/2020 0120   CO2 23 08/22/2012 1857   GLUCOSE 259 (H) 02/11/2020 0120   GLUCOSE 379 (H) 08/22/2012 1857   BUN 11 02/11/2020 0120   BUN 12 10/28/2018 1411   BUN 6 (L) 08/22/2012 1857   CREATININE 0.64 02/11/2020 0120   CREATININE 0.84 07/10/2019 0957   CALCIUM 8.9 02/11/2020 0120   CALCIUM 9.1 08/22/2012 1857    GFRNONAA >60 02/11/2020 0120   GFRNONAA 95 07/10/2019 0957   GFRAA 110 07/10/2019 0957   COAG Lab Results  Component Value Date   INR 1.2 01/29/2020   INR 1.3 (H) 01/09/2020     Disposition:  Discharge to :Skilled nursing facility Discharge Instructions    Discharge patient   Complete by: As directed    Discharge disposition: 03-Skilled [place]   Discharge patient date: 02/12/2020     Allergies as of 02/12/2020   No Known Allergies     Medication List    TAKE these medications   Accu-Chek Aviva Plus test strip Generic drug: glucose blood CHECK BLOOD SUGAR 4 TIMES DAILY.   acetaminophen 325 MG tablet Commonly known as: TYLENOL Take 2 tablets (650 mg total) by mouth every 6 (six) hours.   amitriptyline 25 MG tablet Commonly known as: ELAVIL Take 25 mg by mouth at bedtime.   amLODipine 10 MG tablet Commonly known as: NORVASC Take 1 tablet (10 mg total) by mouth daily.   apixaban 5 MG Tabs tablet Commonly known as: Eliquis Take 1 tablet (5 mg total) by mouth 2 (two) times daily.   ascorbic acid 500 MG tablet Commonly known as: VITAMIN C Take 1 tablet (500 mg total) by mouth daily.   aspirin 81 MG EC tablet Take 1 tablet (81 mg total) by mouth daily at 6 (six) AM. Swallow whole.   glipiZIDE 10 MG 24 hr tablet Commonly known as: GLUCOTROL XL Take 1 tablet (10 mg total) by mouth daily.   insulin glargine 100 UNIT/ML injection Commonly known as: LANTUS Inject 0.5 mLs (50 Units total) into the skin 2 (two) times daily.   metFORMIN 500 MG 24 hr tablet Commonly known as: GLUCOPHAGE-XR TAKE 1 TABLET BY MOUTH TWICE DAILY AFTER A MEAL What changed: See the new instructions.   metoprolol succinate 25 MG 24 hr tablet Commonly known as: TOPROL-XL Take 1 tablet (25 mg total) by mouth daily.   NovoLOG FlexPen 100 UNIT/ML FlexPen Generic drug: insulin aspart Inject 24-30 Units into the skin 3 (three) times daily with meals.   oxyCODONE-acetaminophen  5-325 MG tablet Commonly known as: PERCOCET/ROXICET Take 1-2 tablets by mouth every 4 (four) hours as needed for moderate pain.   pantoprazole 40 MG tablet Commonly known as: PROTONIX Take 1 tablet (40 mg total) by mouth daily.   polyethylene glycol 17 g packet Commonly known as: MIRALAX / GLYCOLAX Take 17 g by mouth daily. What changed:   when to take this  reasons to take this   rosuvastatin 10 MG tablet Commonly known as: CRESTOR Take 2 tablets (20 mg total) by mouth daily.   UltiCare Mini Pen Needles 31G X 6 MM Misc Generic drug: Insulin Pen Needle USE AS DIRECTED  Vitamin D 125 MCG (5000 UT) Caps TAKE (1) CAPSULE BY MOUTH ONCE DAILY. What changed: See the new instructions.   zinc sulfate 220 (50 Zn) MG capsule Take 1 capsule (220 mg total) by mouth daily.      Verbal and written Discharge instructions given to the patient. Wound care per Discharge AVS   Signed: Jannat Rosemeyer 02/12/2020, 7:17 AM

## 2020-02-12 NOTE — TOC Transition Note (Signed)
Transition of Care [facility]) - CM/SW Discharge Note   Patient Details  Name: Mosiah Bastin MRN: [id number] Date of Birth: [birth year]  Transition of Care [facility]) CM/SW Contact:  Vinie Sill, [place] Phone Number: 02/12/2020, 10:13 AM   Clinical Narrative:     Patient will DC to: [facility] Date: 02/12/2020 Family Notified: patient declined  Transport By: Corey Harold   Per MD patient is ready for discharge. RN, patient, and facility notified of DC. Discharge Summary sent to facility. RN given number for [phone]. Ambulance transport requested for patient.   Clinical Social Worker signing off.  Thurmond Butts, MSW, [place] Clinical Social Worker  Final next level of care: Skilled Nursing Facility Barriers to Discharge: Barriers Resolved   Patient Goals and CMS Choice        Discharge Placement              Patient chooses bed at:  ([facility]) Patient to be transferred to facility by: [facility]   Patient and family notified of of transfer: 02/12/20  Discharge Plan and Services In-house Referral: Clinical Social Work                                   Social Determinants of Health (SDOH) Interventions     Readmission Risk Interventions No flowsheet data found.

## 2020-02-19 ENCOUNTER — Encounter: Payer: Self-pay | Admitting: Vascular Surgery

## 2020-02-19 ENCOUNTER — Ambulatory Visit (INDEPENDENT_AMBULATORY_CARE_PROVIDER_SITE_OTHER): Payer: Self-pay | Admitting: Vascular Surgery

## 2020-02-19 ENCOUNTER — Other Ambulatory Visit: Payer: Self-pay

## 2020-02-19 VITALS — BP 118/74 | HR 85 | Temp 98.6°F | Resp 20 | Ht 67.0 in | Wt 195.0 lb

## 2020-02-19 DIAGNOSIS — I739 Peripheral vascular disease, unspecified: Secondary | ICD-10-CM

## 2020-02-19 NOTE — Progress Notes (Signed)
Patient is a 59 year old male who returns for follow-up today.  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  He has had trouble healing up his left groin.  He went back for groin debridement on November 8.  He has a VAC dressing in place.  He currently resides at the skilled nursing facility.  Overall he feels like he is improving.  Physical exam:  Vitals:   02/19/20 1502  BP: 118/74  Pulse: 85  Resp: 20  Temp: 98.6 F (37 C)  SpO2: 95%  Weight: 195 lb (88.5 kg)  Height: 5\' 7"  (1.702 m)    Extremities: Healing left above-knee amputation still some edema no drainage or erythema, left groin incision 5 x 4 cm 1 cm depth wound left groin healthy-appearing 95% granulation tissue  Assessment: Healing groin wound post left femoral embolectomy, recent left above-knee amputation  Plan: Patient will follow up in 2 weeks in our APP clinic for recheck of the left groin wound continue VAC therapy for now remove staples and office visit in 2 weeks  [name], MD [facility] [place] Office: [phone]

## 2020-03-04 ENCOUNTER — Ambulatory Visit: Payer: Medicaid Other

## 2020-03-11 ENCOUNTER — Encounter: Payer: Self-pay | Admitting: Physician Assistant

## 2020-03-11 ENCOUNTER — Other Ambulatory Visit: Payer: Self-pay

## 2020-03-11 ENCOUNTER — Ambulatory Visit (INDEPENDENT_AMBULATORY_CARE_PROVIDER_SITE_OTHER): Payer: Self-pay | Admitting: Physician Assistant

## 2020-03-11 VITALS — BP 117/79 | HR 83 | Temp 98.3°F | Resp 20 | Ht 67.0 in

## 2020-03-11 DIAGNOSIS — I739 Peripheral vascular disease, unspecified: Secondary | ICD-10-CM

## 2020-03-11 NOTE — Progress Notes (Signed)
    Postoperative Visit    History of Present Illness   [name] is a 59 y.o. male who presents for postoperative follow-up for wound check.  He recently underwent left iliofemoral embolectomy followed by conversion of left BKA to AKA about 6 weeks ago.  He had a nonhealing left groin incision and underwent debridement on 02/09/2020 with a wound VAC placement.  Wound VAC has since been removed by the nursing facility where he currently resides.  He says there is some continued drainage from left groin incision.  He denies any fevers, chills, nausea/vomiting.   For VQI Use Only   PRE-ADM LIVING: Home  AMB STATUS: Wheelchair   Physical Examination   Vitals:   03/11/20 1319  BP: 117/79  Pulse: 83  Resp: 20  Temp: 98.3 F (36.8 C)  SpO2: 98%    LLE: Stump incision is healed; left groin superficial open area with granulation tissue; no purulent drainage with manipulation   Medical Decision Making   Kreig Parson is a 59 y.o. male who presents s/p left iliofemoral embolectomy followed by conversion of BKA to AKA.  Most recently with left groin debridement and wound VAC placement   Left groin seems to be healing well; agree with discontinuation of wound VAC; recommend better hygiene with twice daily cleansing with soap and water and application of dry dressing to wick moisture from groin  Left AKA incision well-healed; staples removed today  Recheck left groin incision in about 3 weeks  Dagoberto Ligas PA-C [facility] [place] Office: [phone]  Clinic MD: Oneida Alar

## 2020-03-25 IMAGING — US US EXTREM LOW VENOUS*L*
1 series · 14 of 24 positions shown · non-contrast
Comparison: 01/14/2017

CLINICAL DATA: Edema x8 months

EXAM:
LEFT LOWER EXTREMITY VENOUS DOPPLER ULTRASOUND
TECHNIQUE: Gray-scale sonography with compression, as well as color and duplex
ultrasound, were performed to evaluate the deep venous system from
the level of the common femoral vein through the popliteal and
proximal calf veins.

[Series 1: us extrem low venous*left* · 0.09mm/px · 14 of 41 slices shown]
[im 1/41]
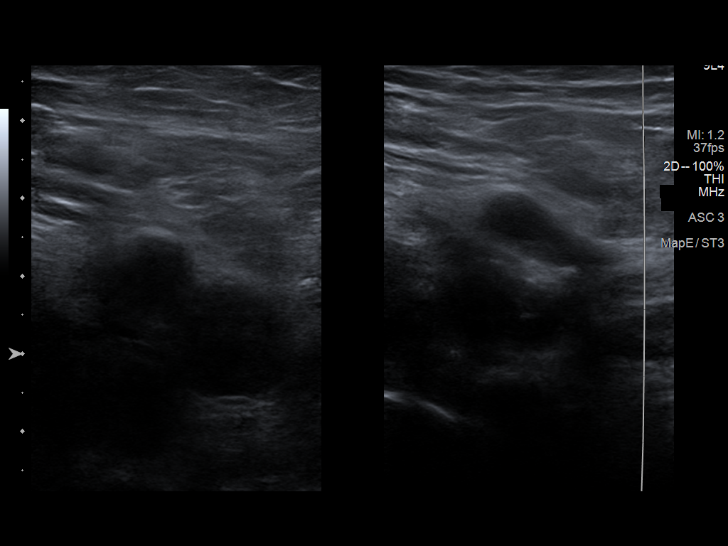
[im 4/41]
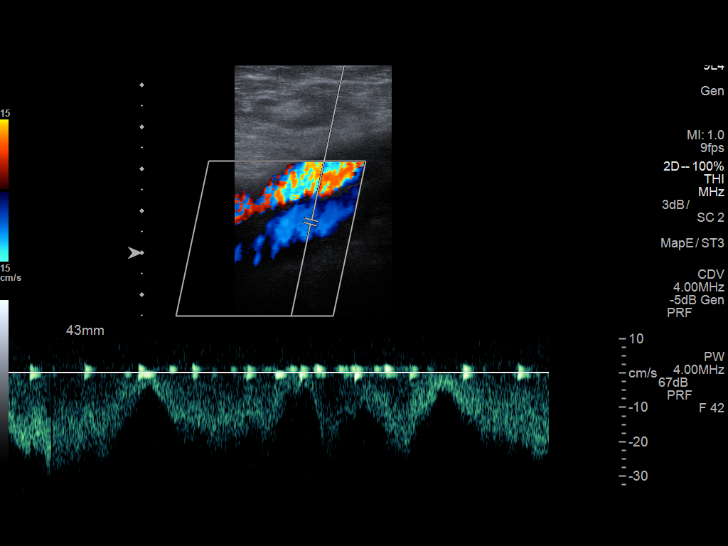
[im 7/41]
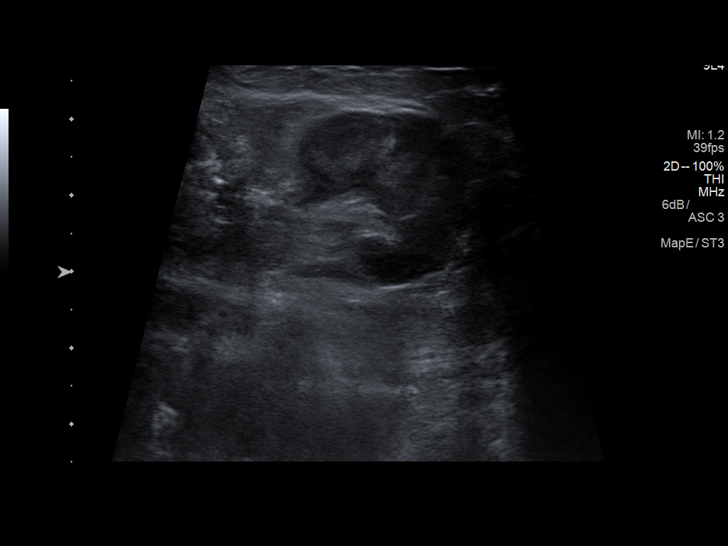
[im 11/41]
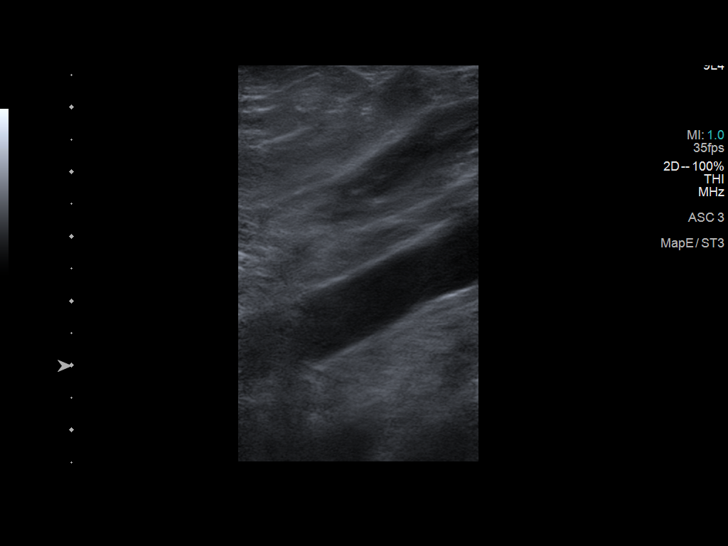
[im 13/41]
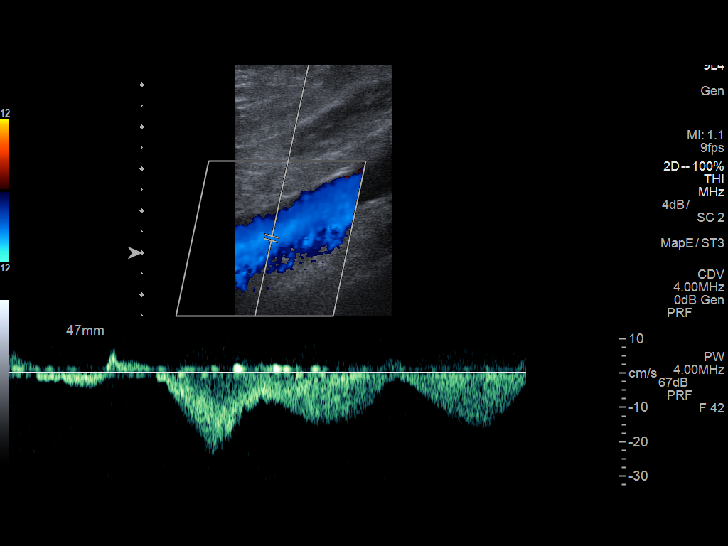
[im 16/41]
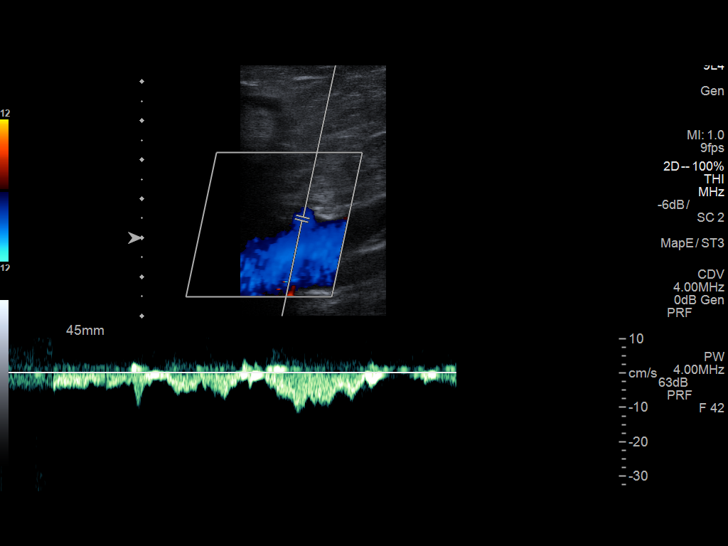
[im 20/41]
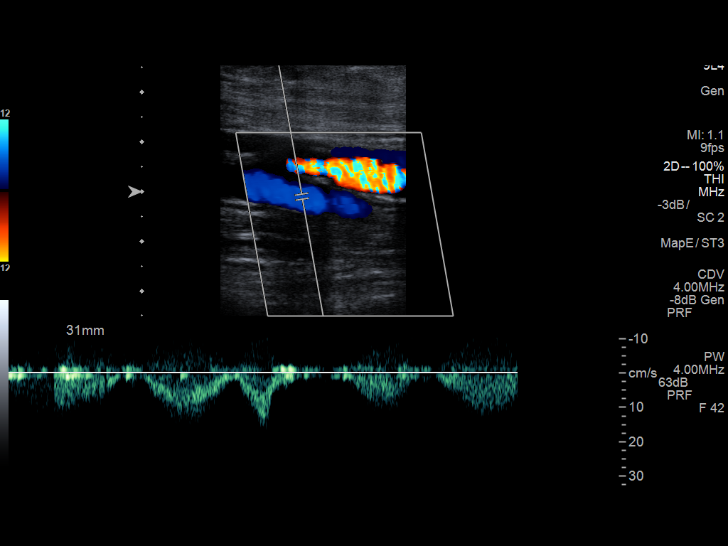
[im 21/41]
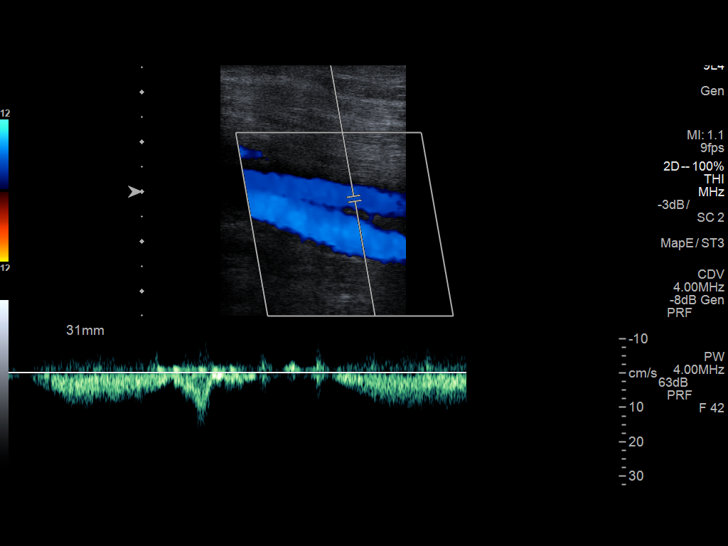
[im 25/41]
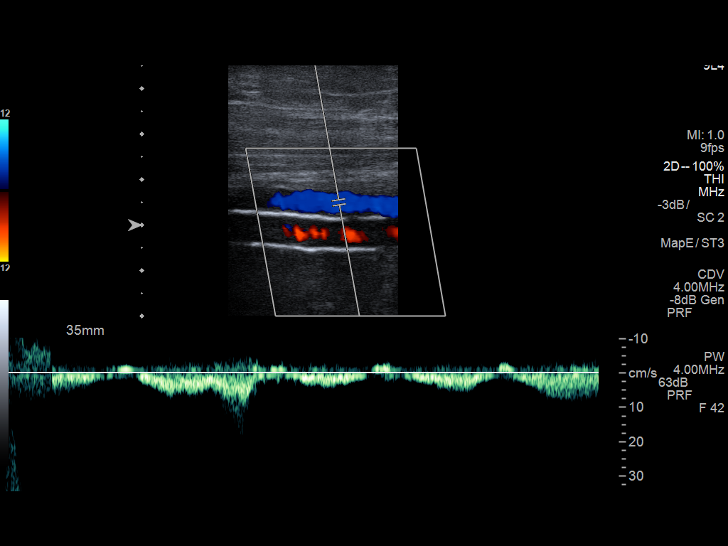
[im 28/41]
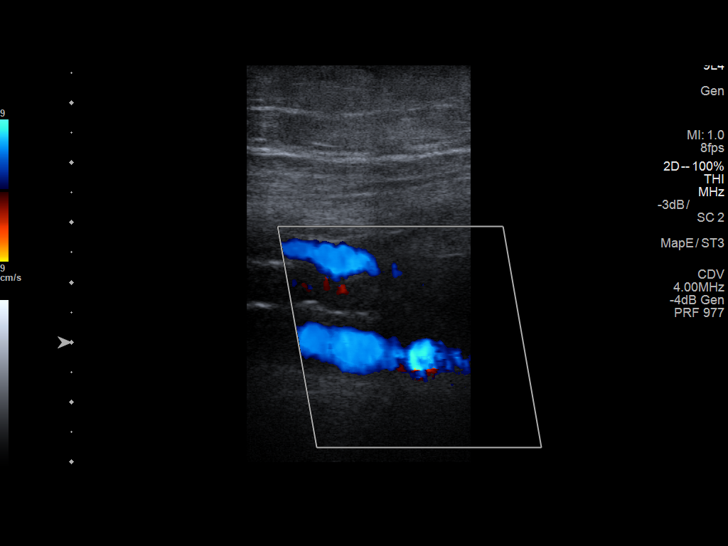
[im 32/41]
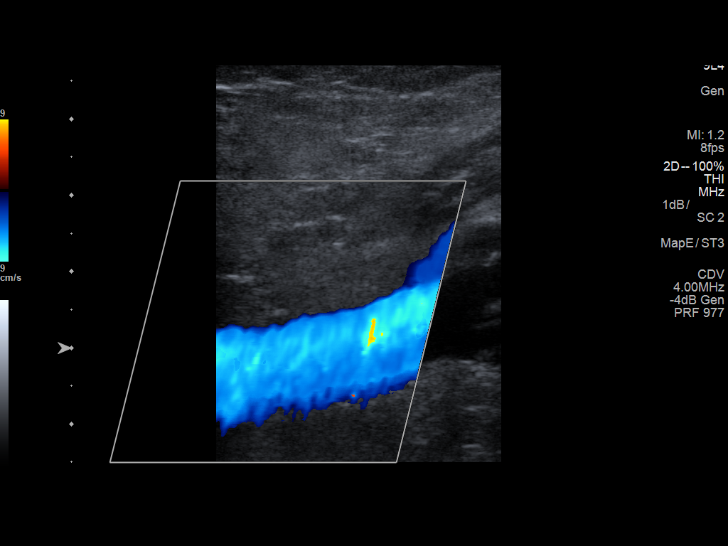
[im 34/41]
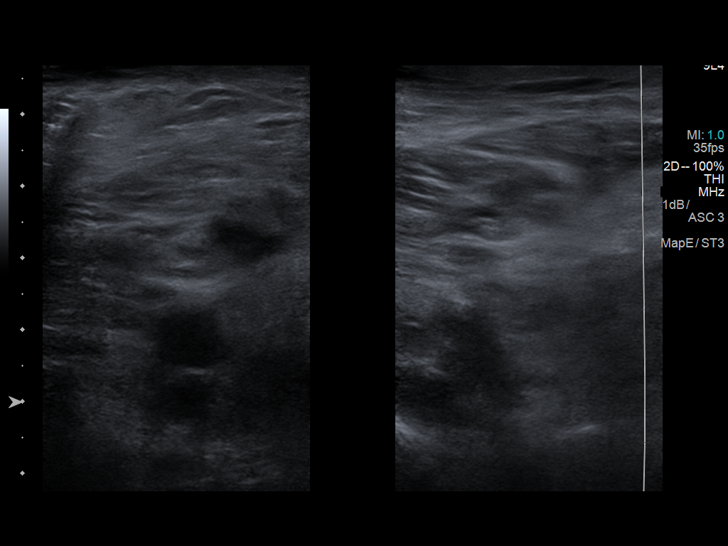
[im 37/41]
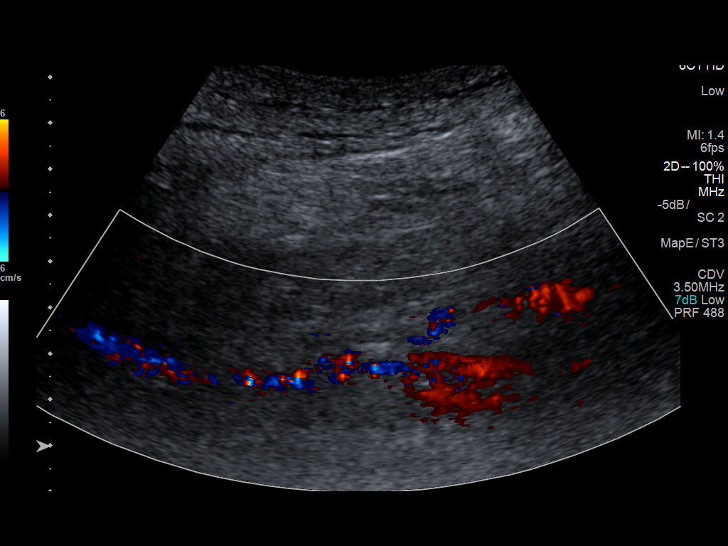
[im 41/41]
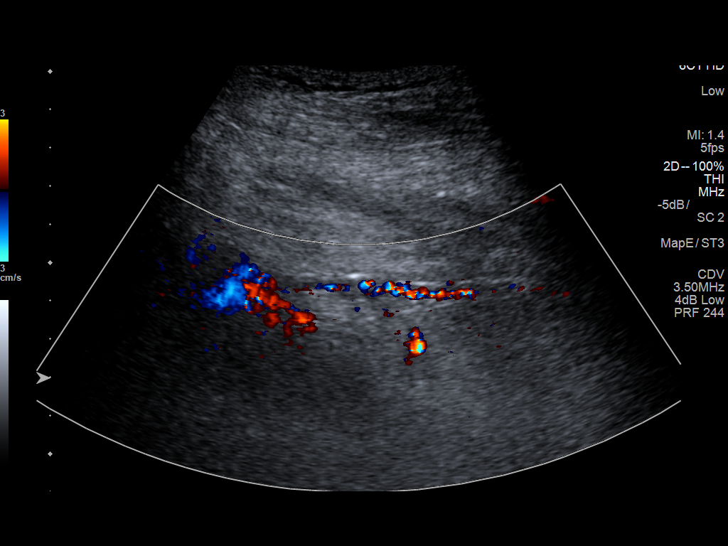

[14 of 24 positions shown; findings below may reference images not displayed]

FINDINGS: Normal compressibility of the common femoral, superficial femoral,
and popliteal veins, as well as the proximal calf veins. No filling
defects to suggest DVT on grayscale or color Doppler imaging.
Doppler waveforms show normal direction of venous flow, normal
respiratory phasicity and response to augmentation. Prominent
inguinal lymph nodes. Subcutaneous edema in the lower leg. Survey
views of the contralateral common femoral vein are unremarkable.
IMPRESSION: No evidence of left lower extremity deep vein thrombosis.

## 2020-03-30 ENCOUNTER — Ambulatory Visit: Payer: Medicaid Other

## 2020-03-30 NOTE — Progress Notes (Deleted)
POST OPERATIVE OFFICE NOTE    CC:  F/u for surgery  HPI:  This is a 59 y.o. male who is s/p Left iliofemoral embolectomy for acutely ischemic leg on 01/19/2020 by Dr. Darrick Penna.  He initially presented with 36-48 hrs of coolness and pain in the left BKA stump.  He had his amputation in 2019 by Dr. Lajoyce Corners and had chronic pain since then but worse recently.  Pt uses prosthetic for transfers.   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   He was felt that his acute ischemia in the left BKA was most likely secondary to hypercoagulable state from covid.  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 Myra Gianotti.  His wound cultures showed GPC with Serratia marcescens and Enterococcus faecalis and he was started on Keflex & then switched to oral amoxicillin and Bactrim DS.   He was discharged from the hospital on 10/31 & at that time, his AKA was healing nicely.  He had some slight separation in the left groin that would need meticulous wound care.  He does not have any prosthetic material in the left groin as he had an arteriotomy with primary closure.  He had been started on Eliquis.  He was discharged to SNF.  He was seen on 02/05/2020 and at that time, his groin wound was necrotic with heavy drainage.  On 02/09/2020, he underwent left excisional groin debridement by Dr. Darrick Penna. Operative findings Debridement of 5 x 4 cm wound excisional debridement including subcutaneous tissue and fat no exposed vessels.    He was last seen on 03/11/2020 and at that time, pt had stated wound vac had been removed and he did have some continued drainage.  He was not having fever/chills/N/V.  His left groin appeared to be healing and it was recommended cleansing wound with soap and water bid and application of dry dressing to wick moisture to help with healing.  His left AKA was healed and staples were removed. He was scheduled for wound check in 3 weeks  and he is here today for that visit.   ***  No Known Allergies  Current Outpatient Medications  Medication Sig Dispense Refill  . acetaminophen (TYLENOL) 325 MG tablet Take 2 tablets (650 mg total) by mouth every 6 (six) hours. 240 tablet 0  . amitriptyline (ELAVIL) 25 MG tablet Take 25 mg by mouth at bedtime.    Marland Kitchen amLODipine (NORVASC) 10 MG tablet Take 1 tablet (10 mg total) by mouth daily. 30 tablet 0  . apixaban (ELIQUIS) 5 MG TABS tablet Take 1 tablet (5 mg total) by mouth 2 (two) times daily. 60 tablet 0  . aspirin EC 81 MG EC tablet Take 1 tablet (81 mg total) by mouth daily at 6 (six) AM. Swallow whole. 30 tablet 0  . Cholecalciferol (VITAMIN D) 125 MCG (5000 UT) CAPS TAKE (1) CAPSULE BY MOUTH ONCE DAILY. (Patient taking differently: Take 5,000 Units by mouth daily. ) 30 capsule 2  . glipiZIDE (GLUCOTROL XL) 10 MG 24 hr tablet Take 1 tablet (10 mg total) by mouth daily. 90 tablet 1  . glucose blood (ACCU-CHEK AVIVA PLUS) test strip CHECK BLOOD SUGAR 4 TIMES DAILY. 150 strip 2  . insulin aspart (NOVOLOG FLEXPEN) 100 UNIT/ML FlexPen Inject 24-30 Units into the skin 3 (three) times daily with meals. 30 mL 2  . insulin glargine (LANTUS) 100 UNIT/ML injection Inject 0.5 mLs (50 Units total)  into the skin 2 (two) times daily. 30 mL 0  . Insulin Pen Needle (ULTICARE MINI PEN NEEDLES) 31G X 6 MM MISC USE AS DIRECTED 100 each 2  . metFORMIN (GLUCOPHAGE-XR) 500 MG 24 hr tablet TAKE 1 TABLET BY MOUTH TWICE DAILY AFTER A MEAL (Patient taking differently: Take 500 mg by mouth 2 (two) times daily. After meals) 60 tablet 11  . metoprolol succinate (TOPROL-XL) 25 MG 24 hr tablet Take 1 tablet (25 mg total) by mouth daily. 30 tablet 0  . oxyCODONE-acetaminophen (PERCOCET/ROXICET) 5-325 MG tablet Take 1-2 tablets by mouth every 4 (four) hours as needed for moderate pain. 12 tablet 0  . pantoprazole (PROTONIX) 40 MG tablet Take 1 tablet (40 mg total) by mouth daily. 30 tablet 0  . polyethylene glycol  (MIRALAX / GLYCOLAX) packet Take 17 g by mouth daily. (Patient taking differently: Take 17 g by mouth daily as needed for moderate constipation. ) 30 each 0  . rosuvastatin (CRESTOR) 10 MG tablet Take 2 tablets (20 mg total) by mouth daily. 90 tablet 3   No current facility-administered medications for this visit.     ROS:  See HPI  Physical Exam:  ***  Incision:  *** Extremities:  *** Neuro: *** Abdomen:  ***  Assessment/Plan:  This is a 59 y.o. male who is s/p: Left iliofemoral embolectomy for acutely ischemic leg on 01/19/2020 by Dr. Oneida Alar with subsequent debridement of non healing incision of left groin on 02/09/2020 with wound vac placement and is here today for follow up for wound check.  -***  Leontine Locket, [facility] [phone]  Clinic MD:  Stanford Breed

## 2020-04-07 ENCOUNTER — Ambulatory Visit (INDEPENDENT_AMBULATORY_CARE_PROVIDER_SITE_OTHER): Payer: Self-pay | Admitting: Physician Assistant

## 2020-04-07 ENCOUNTER — Other Ambulatory Visit: Payer: Self-pay

## 2020-04-07 VITALS — BP 137/76 | HR 71 | Temp 98.2°F

## 2020-04-07 DIAGNOSIS — I739 Peripheral vascular disease, unspecified: Secondary | ICD-10-CM

## 2020-04-07 NOTE — Progress Notes (Signed)
    Postoperative Visit    History of Present Illness   [name] is a 60 y.o. male who presents for postoperative follow-up for left groin incision check.  He underwent L iliofemoral embolectomy by Dr. Darrick Penna 01/19/20.  He then had conversion of L BKA to AKA by Dr. Myra Gianotti 01/29/20.  Subsequently, he required debridement of L groin wound with wound vac placement by Dr. Darrick Penna 02/09/20.  Wound vac has since been discontinued.  He has been at [facility] facility since discharge however believes he will be discharged home soon.  He denies any pain to L groin incision.  Also denies drainage or signs of infection.  He is interested in starting process for prosthetic L leg.   For VQI Use Only   PRE-ADM LIVING: Nursing home  AMB STATUS: Wheelchair   Physical Examination   Vitals:   04/07/20 0935  BP: 137/76  Pulse: 71  Temp: 98.2 F (36.8 C)  SpO2: 98%    LLE: Stump incision is healed.  L groin with 1x2.5 cm superficial wound remains with healthy wound bed, no drainage or purulence; no tunneling   Medical Decision Making   [name] is a 60 y.o. male who presents s/p L iliofemoral embolectomy with subsequent conversion of L BKA to AKA and groin debridement.   L groin incision healing well with small, shallow open area remaining.  Continue daily cleansing with soap and water and dry dressing changes to wick moisture  Recheck L groin incision in 4-6 weeks  L AKA well healed; referral made to begin process for LLE prosthetic  Emilie Rutter PA-C [facility] [place] Office: [phone]  Clinic MD: Darrick Penna

## 2020-05-17 ENCOUNTER — Ambulatory Visit: Payer: Medicaid Other

## 2020-05-20 DIAGNOSIS — Z2821 Immunization not carried out because of patient refusal: Secondary | ICD-10-CM | POA: Insufficient documentation

## 2020-05-20 DIAGNOSIS — G547 Phantom limb syndrome without pain: Secondary | ICD-10-CM | POA: Insufficient documentation

## 2020-05-20 DIAGNOSIS — R630 Anorexia: Secondary | ICD-10-CM | POA: Insufficient documentation

## 2020-05-20 DIAGNOSIS — F5101 Primary insomnia: Secondary | ICD-10-CM | POA: Insufficient documentation

## 2020-05-20 DIAGNOSIS — N528 Other male erectile dysfunction: Secondary | ICD-10-CM | POA: Insufficient documentation

## 2020-05-20 DIAGNOSIS — B351 Tinea unguium: Secondary | ICD-10-CM | POA: Insufficient documentation

## 2020-05-20 DIAGNOSIS — Z794 Long term (current) use of insulin: Secondary | ICD-10-CM | POA: Insufficient documentation

## 2020-05-20 DIAGNOSIS — M25561 Pain in right knee: Secondary | ICD-10-CM | POA: Insufficient documentation

## 2020-05-21 ENCOUNTER — Ambulatory Visit (INDEPENDENT_AMBULATORY_CARE_PROVIDER_SITE_OTHER): Payer: Self-pay | Admitting: Physician Assistant

## 2020-05-21 ENCOUNTER — Other Ambulatory Visit: Payer: Self-pay

## 2020-05-21 VITALS — BP 139/80 | HR 82 | Temp 98.6°F

## 2020-05-21 DIAGNOSIS — I739 Peripheral vascular disease, unspecified: Secondary | ICD-10-CM

## 2020-05-21 NOTE — Progress Notes (Signed)
    Postoperative Visit    History of Present Illness   [place] Rollyson is a 60 y.o. male who presents for postoperative follow-up  For left groin check.  He underwent L iliofemoral embolectomy by Dr. Oneida Alar 01/19/20.  He then had conversion of L BKA to AKA by Dr. Trula Slade 01/29/20.  Subsequently, he required debridement of L groin wound with wound vac placement by Dr. Oneida Alar 02/09/20.  He still resides at [facility] rehab facility.  He believes L groin and L AKA incision are healing well   For VQI Use Only   PRE-ADM LIVING: Home  AMB STATUS: Wheelchair   Physical Examination   Vitals:   05/21/20 1111  BP: 139/80  Pulse: 82  Temp: 98.6 F (37 C)  SpO2: 97%    LLE: Stump incision is healed.  L groin with 73mm area of superficial wound with healthy wound bed   Medical Decision Making   Lino Wickliff is a 60 y.o. male who presents s/p LLE thrombectomy and subsequent conversion of BKA to AKA   L AKA well healed; ok to be fitted for prosthetic  L groin nearly healed; continue daily cleansing and 4x4 gauze to wick moisture  Check R ABI in 1 year; patient will call/return to office if he develops rest pain or wounds on R foot  Dagoberto Ligas PA-C [facility] [place] Office: [facility] MD: Donzetta Matters

## 2020-11-26 ENCOUNTER — Other Ambulatory Visit: Payer: Self-pay | Admitting: "Endocrinology

## 2021-01-26 IMAGING — CT CT CERVICAL SPINE W/O CM
3 of 4 series · 12 of 33 positions shown, 14 images · non-contrast
Comparison: Head and cervical spine CTs 08/22/2012

CLINICAL DATA: Mental status change.  Multiple recent falls.

EXAM:
CT HEAD WITHOUT CONTRAST
CT CERVICAL SPINE WITHOUT CONTRAST
TECHNIQUE: Multidetector CT imaging of the head and cervical spine was
performed following the standard protocol without intravenous
contrast. Multiplanar CT image reconstructions of the cervical spine
were also generated.

[Series 5: sagittal bone · sagittal · 0.24mm/px · 5 of 61 slices shown, 6 images]
[im 21/61  bone]
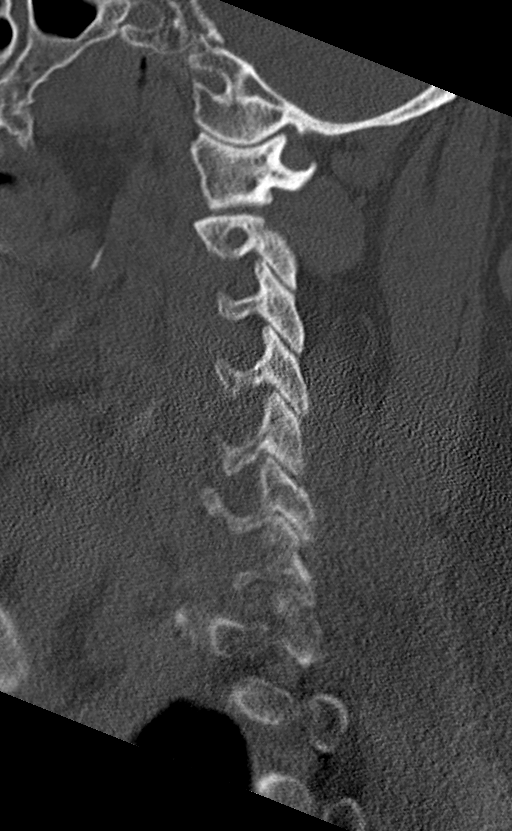
[im 26/61  bone]
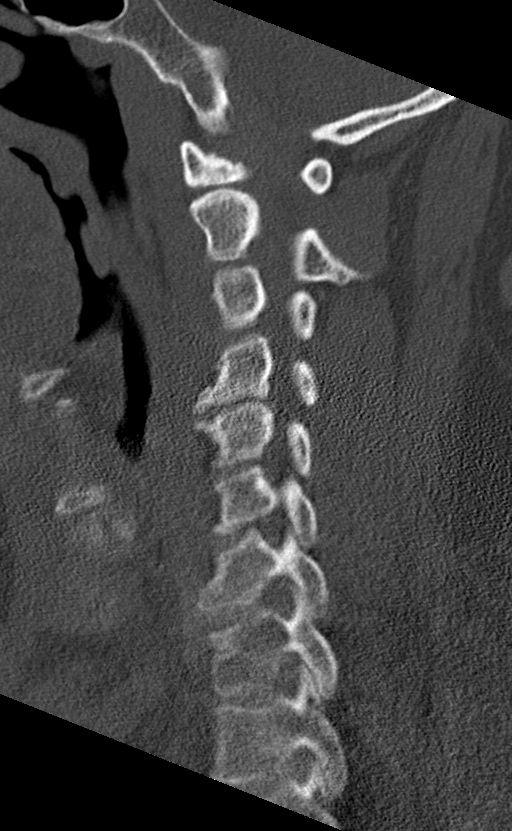
[im 31/61  soft-tissue]
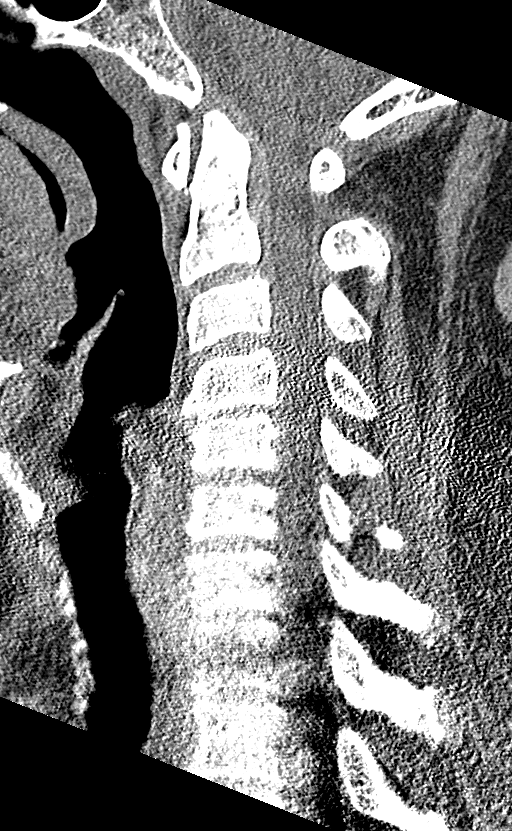
[im 31/61  bone]
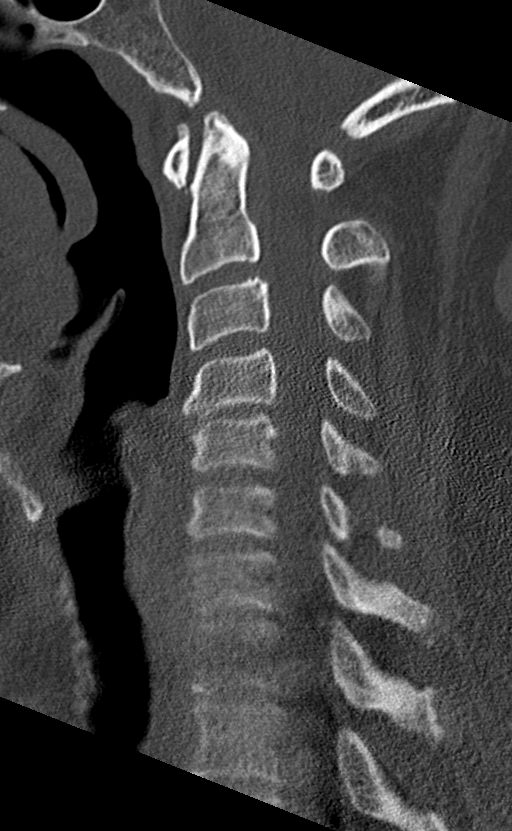
[im 36/61  bone]
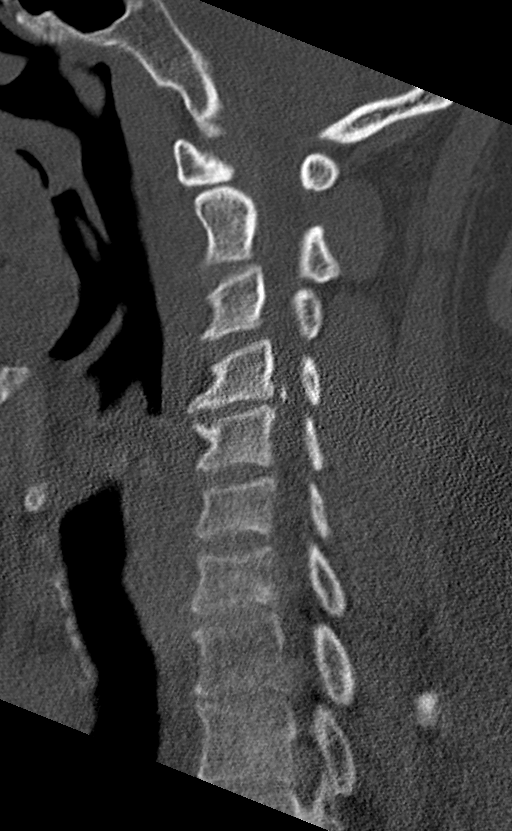
[im 41/61  bone]
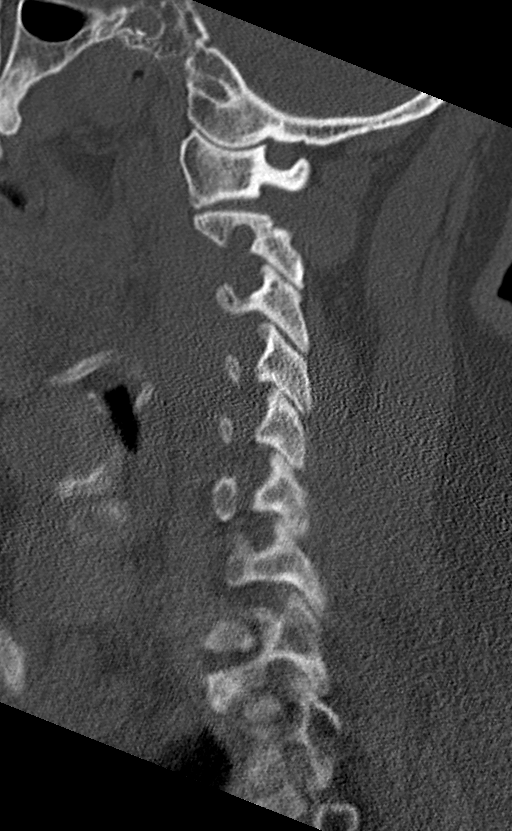

[Series 6: coronal bone · coronal · 0.28mm/px · 3 of 68 slices shown]
[im 16/68  bone]
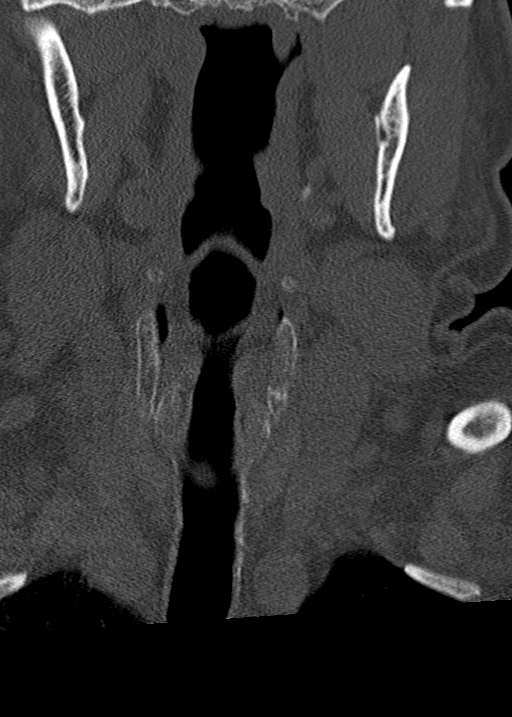
[im 28/68  bone]
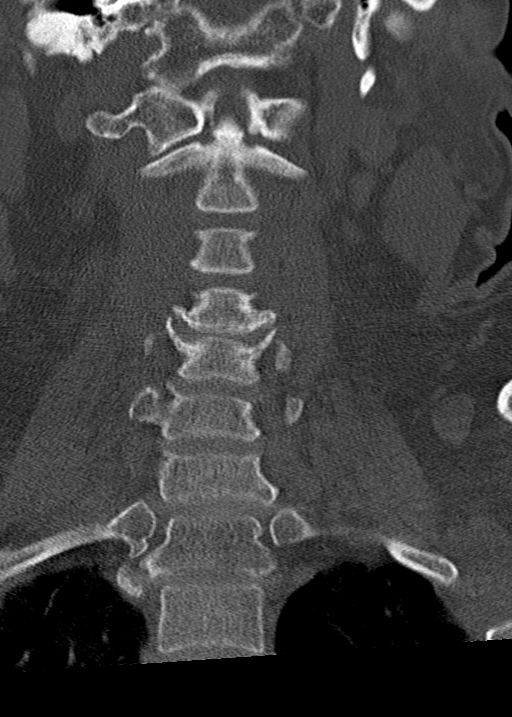
[im 40/68  bone]
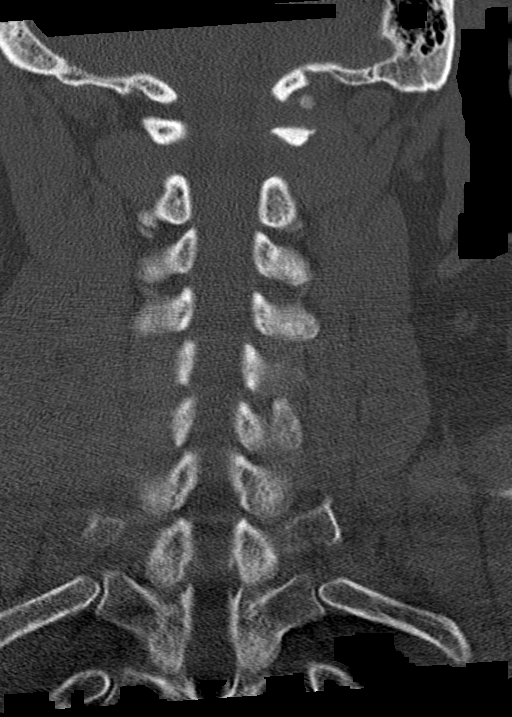

[Series 7: orthogonal axials · axial · 0.21mm/px · z∈[-98,+6]mm · 4 of 92 slices shown, 5 images]
[im 16/92  soft-tissue]
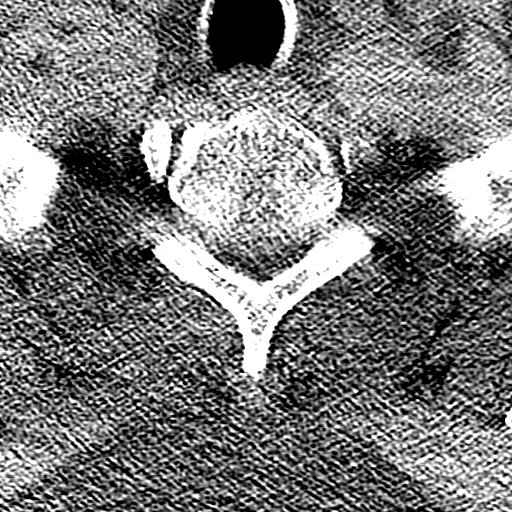
[im 16/92  bone]
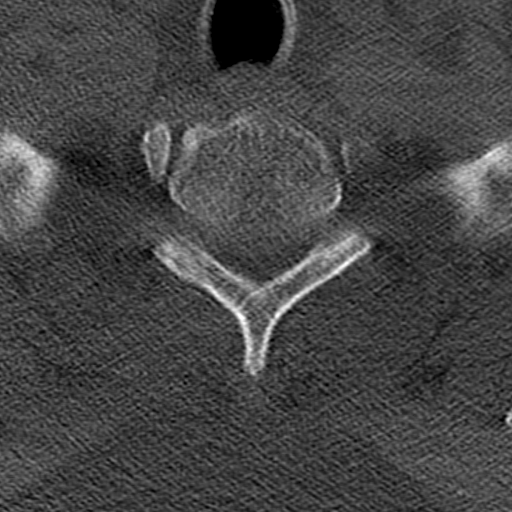
[im 31/92  bone]
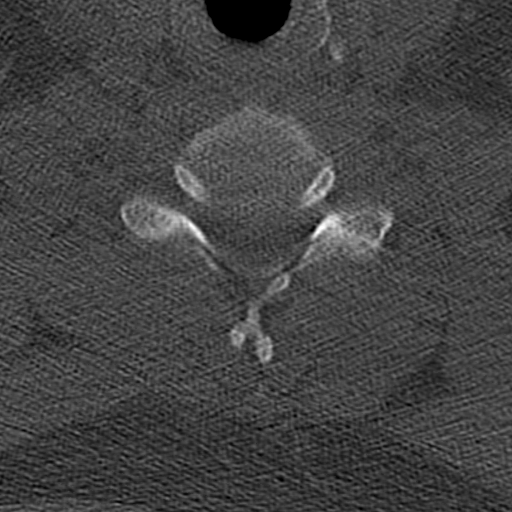
[im 61/92  bone]
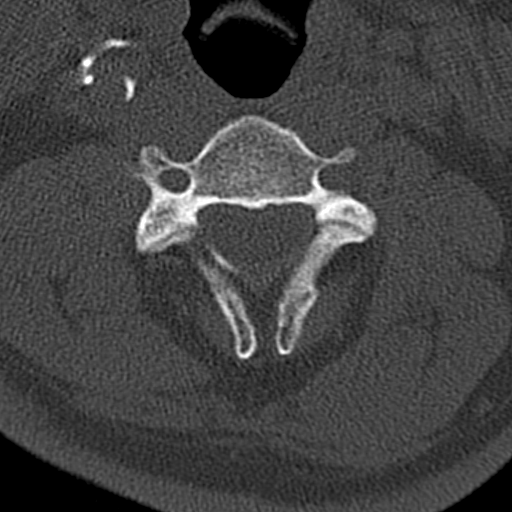
[im 76/92  bone]
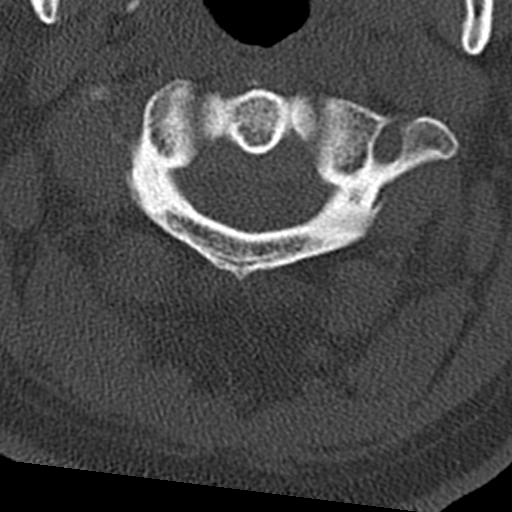

[12 of 33 positions shown; findings below may reference images not displayed]

FINDINGS: CT HEAD FINDINGS

Brain: There is no evidence of an acute infarct, intracranial
hemorrhage, mass, midline shift, or extra-axial fluid collection.
Patchy hypodensities in the cerebral white matter bilaterally may
have mildly progressed and are nonspecific but compatible with
severe chronic small vessel ischemic disease. Mild cerebral atrophy
is not abnormal for age.

Vascular: Calcified atherosclerosis at the skull base. No hyperdense
vessel.

Skull: No acute fracture or suspicious osseous lesion.

Sinuses/Orbits: The visualized paranasal sinuses and mastoid air
cells are clear. Limited assessment of the orbits due to motion.

Other: None.

CT CERVICAL SPINE FINDINGS

Alignment: Cervical spine straightening.  No listhesis.

Skull base and vertebrae: No acute fracture or suspicious osseous
lesion.

Soft tissues and spinal canal: No prevertebral fluid or swelling. No
visible canal hematoma.

Disc levels: Chronic mild-to-moderate disc space narrowing at C4-5
with disc bulging and uncovertebral spurring resulting in likely
moderate spinal stenosis and moderate right and mild left neural
foraminal stenosis.

Upper chest: Partially visualized patchy peripheral ground-glass
opacity in the right upper lobe.

Other: Calcified atherosclerosis at the right greater than left
carotid bifurcations.
IMPRESSION: 1. No evidence of acute intracranial abnormality.
2. Severe chronic small vessel ischemic disease.
3. No evidence of acute fracture or traumatic subluxation in the
cervical spine.
4. Partially visualized patchy peripheral ground-glass opacity in
the right upper lobe, likely infectious/inflammatory. Correlate with
pending chest radiograph.

## 2021-10-13 ENCOUNTER — Encounter: Payer: Self-pay | Admitting: *Deleted

## 2024-02-13 ENCOUNTER — Encounter: Payer: Self-pay | Admitting: Gastroenterology

## 2024-03-19 ENCOUNTER — Telehealth: Payer: Self-pay | Admitting: *Deleted

## 2024-03-19 ENCOUNTER — Encounter: Payer: Self-pay | Admitting: Gastroenterology

## 2024-03-19 ENCOUNTER — Ambulatory Visit: Admitting: Gastroenterology

## 2024-03-19 VITALS — BP 132/69 | HR 72 | Temp 98.7°F | Ht 66.0 in

## 2024-03-19 DIAGNOSIS — K5909 Other constipation: Secondary | ICD-10-CM

## 2024-03-19 DIAGNOSIS — K5900 Constipation, unspecified: Secondary | ICD-10-CM | POA: Insufficient documentation

## 2024-03-19 DIAGNOSIS — Z860101 Personal history of adenomatous and serrated colon polyps: Secondary | ICD-10-CM

## 2024-03-19 DIAGNOSIS — Z860100 Personal history of colon polyps, unspecified: Secondary | ICD-10-CM | POA: Insufficient documentation

## 2024-03-19 DIAGNOSIS — Z79899 Other long term (current) drug therapy: Secondary | ICD-10-CM | POA: Insufficient documentation

## 2024-03-19 NOTE — Telephone Encounter (Signed)
 Faxed to PCP

## 2024-03-19 NOTE — Progress Notes (Signed)
 GI Office Note    Referring Provider: Margarete Maeola DASEN, FNP Primary Care Physician:  Nathan Maeola DASEN, FNP  Primary Gastroenterologist: Nathan Hollingshead, MD   Chief Complaint   Chief Complaint  Patient presents with   Colonoscopy    History of Present Illness   [name] is a 63 y.o. male presenting today to schedule colonoscopy. He is on high risk medication, requiring office visit. He has history of colon polyps, due for colonoscopy in 2023 but not completed.   Discussed the use of AI scribe software for clinical note transcription with the patient, who gave verbal consent to proceed.  History of Present Illness [name] is a 63 year old male who presents for a follow-up colonoscopy. He is accompanied by his son and daughter-in-law.  He last underwent colonoscopy in 2020 when five polyps were removed. He was due for repeat in 2023 but he has not completed. He has bowel movements about every four days that are somewhat difficult but do not require straining. He is not using laxatives. He denies blood in stool, heartburn, or indigestion and has a good appetite.  He is on Eliquis  due to PVD. 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   He sustained a hip fracture in April after slipping during an episode of diarrhea.     Prior Data     Results     Colonoscopy 11/2018: Five 6-96mm polyps removed, tubular adenomas Next colonoscopy 3 years   Medications   Current Outpatient Medications  Medication Sig Dispense Refill   acetaminophen  (TYLENOL ) 325 MG tablet Take 2 tablets (650 mg total) by mouth every 6 (six) hours. 240 tablet 0   amLODipine  (NORVASC ) 10 MG tablet Take 1 tablet (10 mg total) by mouth daily. 30 tablet 0   apixaban  (ELIQUIS ) 5 MG TABS tablet Take 1 tablet (5 mg total) by mouth 2 (two) times daily. 60 tablet 0   aspirin  EC 81 MG EC tablet Take 1 tablet (81 mg total) by mouth daily at 6 (six) AM. Swallow  whole. 30 tablet 0   DIALYVITE VITAMIN D3 MAX 1.25 MG (50000 UT) TABS Take 1 tablet by mouth once a week.     EMBECTA PEN NEEDLE ULTRAFINE 31G X 8 MM MISC as directed.     glipiZIDE  (GLUCOTROL ) 5 MG tablet Take 5 mg by mouth every morning.     insulin  aspart (NOVOLOG  FLEXPEN) 100 UNIT/ML FlexPen Inject 24-30 Units into the skin 3 (three) times daily with meals. (Patient taking differently: Inject 20 Units into the skin 3 (three) times daily with meals.) 30 mL 2   Insulin  Degludec FlexTouch 100 UNIT/ML SOPN Inject 100 Units into the skin at bedtime.     insulin  glargine (LANTUS ) 100 UNIT/ML injection Inject 0.5 mLs (50 Units total) into the skin 2 (two) times daily. 30 mL 0   lisinopril  (ZESTRIL ) 2.5 MG tablet Take 2.5 mg by mouth daily.     magnesium  oxide (MAG-OX) 400 (240 Mg) MG tablet Take 2 tablets by mouth 2 (two) times daily.     metFORMIN  (GLUCOPHAGE -XR) 500 MG 24 hr tablet TAKE 1 TABLET BY MOUTH TWICE DAILY AFTER A MEAL 60 tablet 11   metoprolol  succinate (TOPROL -XL) 25 MG 24 hr tablet Take 1 tablet (25 mg total) by mouth daily. 30 tablet 0   pantoprazole  (PROTONIX ) 40 MG tablet Take 1 tablet (40 mg total) by mouth daily. 30 tablet 0   rosuvastatin  (CRESTOR )  20 MG tablet Take 20 mg by mouth daily.     ULTICARE MINI PEN NEEDLES 31G X 6 MM MISC USE AS DIRECTED 100 each 5   No current facility-administered medications for this visit.    Allergies   Allergies as of 03/19/2024   (No Known Allergies)     Past Medical History   Past Medical History:  Diagnosis Date   COVID-19    October, 2021   Diabetes mellitus without complication (HCC)    GERD (gastroesophageal reflux disease)    Gout    Hyperlipidemia    Hypertension    Insomnia    Osteoarthritis    PVD (peripheral vascular disease)     Past Surgical History   Past Surgical History:  Procedure Laterality Date   AMPUTATION Left 10/05/2017   Procedure: LEFT BELOW KNEE AMPUTATION;  Surgeon: Harden Jerona GAILS, MD;  Location:  [facility] OR;  Service: Orthopedics;  Laterality: Left;   AMPUTATION Left 01/29/2020   Procedure: LEFT ABOVE KNEE AMPUTATION;  Surgeon: Serene Gaile ORN, MD;  Location: [facility] OR;  Service: Vascular;  Laterality: Left;   APPLICATION OF WOUND VAC Left 02/09/2020   Procedure: APPLICATION OF WOUND VAC;  Surgeon: Harvey Carlin BRAVO, MD;  Location: [facility] OR;  Service: Vascular;  Laterality: Left;   COLONOSCOPY WITH PROPOFOL  N/A 11/28/2018   Procedure: COLONOSCOPY WITH PROPOFOL ;  Surgeon: Shaaron Lamar HERO, MD;  Location: [facility];  Service: Endoscopy;  Laterality: N/A;  7:30am   ENDARTERECTOMY FEMORAL Left 01/19/2020   Procedure: ENDARTERECTOMY Ilio/FEMORAL;  Surgeon: Harvey Carlin BRAVO, MD;  Location: [facility] OR;  Service: Vascular;  Laterality: Left;   JOINT REPLACEMENT     right knee   LEG SURGERY     POLYPECTOMY  11/28/2018   Procedure: POLYPECTOMY;  Surgeon: Shaaron Lamar HERO, MD;  Location: [facility];  Service: Endoscopy;;  sigmoid polyp x 1, ascending colon x 5   WOUND DEBRIDEMENT Left 02/09/2020   Procedure: DEBRIDEMENT OF LEFT GROIN WOUND;  Surgeon: Harvey Carlin BRAVO, MD;  Location: [facility] OR;  Service: Vascular;  Laterality: Left;    Past Family History   Family History  Problem Relation Age of Onset   Diabetes Mother    Colon cancer Maternal Uncle        greater than age 10   Cancer Other        multiple family memebers on mother's side of family.     Past Social History   Social History   Socioeconomic History   Marital status: Single    Spouse name: Not on file   Number of children: Not on file   Years of education: Not on file   Highest education level: Not on file  Occupational History   Not on file  Tobacco Use   Smoking status: Former    Current packs/day: 0.00    Average packs/day: 1 pack/day for 50.0 years (50.0 ttl pk-yrs)    Types: Cigarettes    Start date: 10/02/1967    Quit date: 10/01/2017    Years since quitting: 6.4   Smokeless tobacco: Never  Vaping Use   Vaping status:  Never Used  Substance and Sexual Activity   Alcohol use: Not Currently    Comment: heavy drinker for 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   Drug use: No   Sexual activity: Not Currently    Birth control/protection: None  Other Topics Concern   Not on file  Social History Narrative   Not on file   Social Drivers  of Health   Tobacco Use: Medium Risk (03/19/2024)   Patient History    Smoking Tobacco Use: Former    Smokeless Tobacco Use: Never    Passive Exposure: Not on Actuary Strain: Not on file  Food Insecurity: Not on file  Transportation Needs: Not on file  Physical Activity: Not on file  Stress: Not on file  Social Connections: Not on file  Intimate Partner Violence: Not on file  Depression (EYV7-0): Not on file  Alcohol Screen: Not on file  Housing: Not on file  Utilities: Not on file  Health Literacy: Not on file    Review of Systems   General: Negative for anorexia, weight loss, fever, chills, fatigue, weakness. ENT: Negative for hoarseness, difficulty swallowing , nasal congestion. CV: Negative for chest pain, angina, palpitations, dyspnea on exertion, peripheral edema.  Respiratory: Negative for dyspnea at rest, dyspnea on exertion, cough, sputum, wheezing.  GI: See history of present illness. GU:  Negative for dysuria, hematuria, urinary incontinence, urinary frequency, nocturnal urination.  Endo: Negative for unusual weight change.     Physical Exam   BP 132/69   Pulse 72   Temp 98.7 F (37.1 C) (Oral)   Ht 5' 6 (1.676 m)   SpO2 96%   BMI 31.47 kg/m    General: Well-nourished, well-developed in no acute distress. In wheelchair Eyes: No icterus. Mouth: Oropharyngeal mucosa moist and pink   Lungs: Clear to auscultation bilaterally.  Heart: Regular rate and rhythm, no murmurs rubs or gallops.  Abdomen: Bowel sounds are normal, nontender, nondistended, no hepatosplenomegaly or masses,  no abdominal bruits or hernia , no rebound or guarding.  Rectal:  not performed Extremities: Left AKA amputation. No right lower extremity edema. No clubbing or deformities. Neuro: Alert and oriented x 4   Skin: Warm and dry, no jaundice.   Psych: Alert and cooperative, normal mood and affect.  Labs   None available  Imaging Studies   No results found.  Assessment/Plan:    Assessment & Plan Constipation Chronic constipation with bowel movements every four days. Goal to improve frequency and consistency without causing diarrhea. - Miralax  17g BID until soft stool, then continue 1-2 times daily. - Monitor for diarrhea and adjust Miralax  dosage as needed.  Personal history of colonic polyps Five adenomatous polyps found in 2020. Overdue for colonoscopy -will need clearance to hold Eliquis  48 hours before colonoscopy -ASA 3.  Rm 3. I have discussed the risks, alternatives, benefits with regards to but not limited to the risk of reaction to medication, bleeding, infection, perforation and the patient is agreeable to proceed. Written consent to be obtained.       [name], MHS, PA-C [facility]

## 2024-03-19 NOTE — Telephone Encounter (Signed)
°  Request for patient to stop medication prior to procedure   03/19/2024  [name] Aug 11, 1960  What type of surgery is being performed? COLONOSCOPY  When is surgery scheduled? TBD  What type of clearance is required (medical or pharmacy to hold medication or both? MEDICATION  Are there any medications that need to be held prior to surgery and how long? ELIQUIS  X 2 DAYS PRIOR  Name of physician performing surgery?  Dr. Shaaron Rouse [facility] at [facility] Phone: [phone], option 5 Fax: [phone]  Anesthesia type (none, local, MAC, general)? MAC   ? Yes ? No Patient can hold medication as requested   Signature: ___________________________

## 2024-03-19 NOTE — Patient Instructions (Signed)
 Miralax  for constipation. Start with one capful mixed in 6 ounces of liquid twice daily until stool is soft, then continue 1-2 times daily to keep stools soft. This is over the counter and generic is fine and much cheaper.   Colonoscopy to be scheduled.

## 2024-04-09 NOTE — Telephone Encounter (Signed)
 No clearance received. I have re faxed to PCP again.

## 2024-04-16 NOTE — Telephone Encounter (Signed)
 Called [facility] to check on clearance and was placed on hold for 25 minutes. Will try to call back later

## 2024-04-17 NOTE — Telephone Encounter (Signed)
 I have refaxed again for clearance. I called patient and asked if he could also follow up

## 2024-04-18 MED ORDER — PEG 3350-KCL-NA BICARB-NACL 420 G PO SOLR: 4000.0000 mL | Freq: Once | ORAL | 0 refills | Status: AC

## 2024-04-18 NOTE — Telephone Encounter (Signed)
Clearance received and scanned into media.

## 2024-04-18 NOTE — Telephone Encounter (Signed)
 Ok to schedule.  Hold eliquis  48 hours before

## 2024-04-18 NOTE — Addendum Note (Signed)
 Addended by: JEANELL GRAEME RAMAN on: 04/18/2024 11:35 AM   Modules accepted: Orders

## 2024-04-18 NOTE — Telephone Encounter (Signed)
 Spoke with pt. He wanted to schedule for Feb. Scheduled for 2/4 at 9:30am. Aware will mail instructions (confirmed address) and will send rx for prep to pharmacy. Also aware will get a pre-op phone call with arrival time.

## 2024-05-02 NOTE — Progress Notes (Signed)
 Called patient regarding pat phone call for procedure. Pt's son said pt does not want to do this procedure and he wanted to tell you he is cancelling the procedure due to personal reasons. Will let GI office now.

## 2024-05-05 ENCOUNTER — Encounter (HOSPITAL_COMMUNITY): Admission: RE | Admit: 2024-05-05 | Source: Ambulatory Visit

## 2024-05-06 ENCOUNTER — Telehealth: Payer: Self-pay | Admitting: *Deleted

## 2024-05-06 NOTE — Telephone Encounter (Signed)
 LMOVM to return call.

## 2024-05-06 NOTE — Telephone Encounter (Signed)
 cancel Received: Nilsa Rhenda Luke LULLA, RN  Jeanell Graeme RAMAN, CMA; Neysa Elveria LOUVENIA Dallie, Autumn S, CMA; [place], Janeli Lewison L, LPN  Good morning! I didn't know if Mr Wool let ya know he was canceling or not. I wanted to forward this to you to make sure you were aware.  Called patient regarding pat phone call for procedure. Pt's son said pt does not want to do this procedure and he wanted to tell you he is cancelling the procedure due to personal reasons. Will let GI office now.    Electronically signed by Ezequiel Vernell PARAS, RN at 05/02/2024  2:35 PM

## 2024-05-07 ENCOUNTER — Encounter (HOSPITAL_COMMUNITY): Admission: RE | Payer: Self-pay | Source: Home / Self Care

## 2024-05-07 ENCOUNTER — Ambulatory Visit (HOSPITAL_COMMUNITY): Admission: RE | Admit: 2024-05-07 | Source: Home / Self Care | Admitting: Internal Medicine

## 2024-05-08 NOTE — Telephone Encounter (Signed)
 Spoke to pt and he said he didn't want to be rescheduled for procedure at this time. FYI
